# Patient Record
Sex: Male | Born: 1965 | Race: Black or African American | Hispanic: No | Smoking: Former smoker
Health system: Southern US, Community
[De-identification: ages and names within clinical notes are randomized; demographics above are authoritative.]

## PROBLEM LIST (undated history)

## (undated) DIAGNOSIS — R0602 Shortness of breath: Secondary | ICD-10-CM

## (undated) DIAGNOSIS — D571 Sickle-cell disease without crisis: Secondary | ICD-10-CM

## (undated) HISTORY — DX: Shortness of breath: R06.02

## (undated) HISTORY — PX: LAPAROSCOPIC GASTROTOMY W/ REPAIR OF ULCER: SUR772

---

## 1997-10-30 ENCOUNTER — Emergency Department (HOSPITAL_COMMUNITY): Admission: EM | Admit: 1997-10-30 | Discharge: 1997-10-30 | Payer: Self-pay | Admitting: Emergency Medicine

## 1997-11-04 ENCOUNTER — Emergency Department (HOSPITAL_COMMUNITY): Admission: EM | Admit: 1997-11-04 | Discharge: 1997-11-04 | Payer: Self-pay | Admitting: Emergency Medicine

## 2000-03-05 ENCOUNTER — Emergency Department (HOSPITAL_COMMUNITY): Admission: EM | Admit: 2000-03-05 | Discharge: 2000-03-05 | Payer: Self-pay

## 2000-03-05 ENCOUNTER — Encounter: Payer: Self-pay | Admitting: Emergency Medicine

## 2000-12-09 ENCOUNTER — Emergency Department (HOSPITAL_COMMUNITY): Admission: EM | Admit: 2000-12-09 | Discharge: 2000-12-09 | Payer: Self-pay | Admitting: Emergency Medicine

## 2001-01-04 ENCOUNTER — Emergency Department (HOSPITAL_COMMUNITY): Admission: EM | Admit: 2001-01-04 | Discharge: 2001-01-04 | Payer: Self-pay | Admitting: *Deleted

## 2002-01-23 ENCOUNTER — Emergency Department (HOSPITAL_COMMUNITY): Admission: EM | Admit: 2002-01-23 | Discharge: 2002-01-23 | Payer: Self-pay | Admitting: Emergency Medicine

## 2002-01-23 ENCOUNTER — Encounter: Payer: Self-pay | Admitting: Emergency Medicine

## 2003-03-13 ENCOUNTER — Emergency Department (HOSPITAL_COMMUNITY): Admission: AD | Admit: 2003-03-13 | Discharge: 2003-03-13 | Payer: Self-pay | Admitting: Family Medicine

## 2004-04-30 ENCOUNTER — Inpatient Hospital Stay (HOSPITAL_COMMUNITY): Admission: EM | Admit: 2004-04-30 | Discharge: 2004-05-01 | Payer: Self-pay | Admitting: Emergency Medicine

## 2005-07-07 ENCOUNTER — Inpatient Hospital Stay (HOSPITAL_COMMUNITY): Admission: EM | Admit: 2005-07-07 | Discharge: 2005-07-10 | Payer: Self-pay | Admitting: Emergency Medicine

## 2006-01-13 ENCOUNTER — Emergency Department (HOSPITAL_COMMUNITY): Admission: EM | Admit: 2006-01-13 | Discharge: 2006-01-13 | Payer: Self-pay | Admitting: *Deleted

## 2007-02-07 ENCOUNTER — Inpatient Hospital Stay (HOSPITAL_COMMUNITY): Admission: AC | Admit: 2007-02-07 | Discharge: 2007-02-10 | Payer: Self-pay
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
## 2011-01-23 LAB — I-STAT 8, (EC8 V) (CONVERTED LAB)
Acid-base deficit: 1
Bicarbonate: 24
Glucose, Bld: 131 — ABNORMAL HIGH
Hemoglobin: 13.9
Potassium: 3.9
TCO2: 25
pCO2, Ven: 41.5 — ABNORMAL LOW

## 2011-01-23 LAB — RAPID URINE DRUG SCREEN, HOSP PERFORMED
Barbiturates: NOT DETECTED
Benzodiazepines: NOT DETECTED
Cocaine: POSITIVE — AB
Opiates: NOT DETECTED
Tetrahydrocannabinol: NOT DETECTED

## 2011-01-23 LAB — PROTIME-INR: Prothrombin Time: 13

## 2011-01-23 LAB — TYPE AND SCREEN
ABO/RH(D): O NEG
Antibody Screen: NEGATIVE

## 2011-01-23 LAB — CBC
HCT: 37.7 — ABNORMAL LOW
Hemoglobin: 12.3 — ABNORMAL LOW
MCHC: 32.6
MCV: 91.1
RBC: 4.26
RDW: 15 — ABNORMAL HIGH
RDW: 15 — ABNORMAL HIGH

## 2011-01-23 LAB — POCT I-STAT CREATININE: Creatinine, Ser: 1.3

## 2011-09-02 ENCOUNTER — Encounter (HOSPITAL_COMMUNITY): Payer: Self-pay | Admitting: Emergency Medicine

## 2011-09-02 ENCOUNTER — Emergency Department (HOSPITAL_COMMUNITY)
Admission: EM | Admit: 2011-09-02 | Discharge: 2011-09-02 | Disposition: A | Discharge status: Home / Self Care | Payer: Self-pay | Attending: Emergency Medicine | Admitting: Emergency Medicine

## 2011-09-02 DIAGNOSIS — D571 Sickle-cell disease without crisis: Secondary | ICD-10-CM | POA: Insufficient documentation

## 2011-09-02 DIAGNOSIS — Z202 Contact with and (suspected) exposure to infections with a predominantly sexual mode of transmission: Secondary | ICD-10-CM | POA: Insufficient documentation

## 2011-09-02 DIAGNOSIS — R369 Urethral discharge, unspecified: Secondary | ICD-10-CM | POA: Insufficient documentation

## 2011-09-02 DIAGNOSIS — B356 Tinea cruris: Secondary | ICD-10-CM | POA: Insufficient documentation

## 2011-09-02 HISTORY — DX: Sickle-cell disease without crisis: D57.1

## 2011-09-02 LAB — URINALYSIS, ROUTINE W REFLEX MICROSCOPIC
Bilirubin Urine: NEGATIVE
Glucose, UA: NEGATIVE mg/dL
Ketones, ur: NEGATIVE mg/dL
Specific Gravity, Urine: 1.016 (ref 1.005–1.030)
Urobilinogen, UA: 1 mg/dL (ref 0.0–1.0)
pH: 6 (ref 5.0–8.0)

## 2011-09-02 LAB — URINE MICROSCOPIC-ADD ON

## 2011-09-02 MED ORDER — METRONIDAZOLE 500 MG PO TABS: 500.0000 mg | ORAL_TABLET | Freq: Two times a day (BID) | ORAL | Status: AC

## 2011-09-02 MED ORDER — CEFTRIAXONE SODIUM 250 MG IJ SOLR
250.0000 mg | Freq: Once | INTRAMUSCULAR | Status: AC
  Administered 2011-09-02: 250 mg via INTRAMUSCULAR
  Filled 2011-09-02: qty 250

## 2011-09-02 MED ORDER — AZITHROMYCIN 250 MG PO TABS
1000.0000 mg | ORAL_TABLET | Freq: Once | ORAL | Status: AC
  Administered 2011-09-02: 1000 mg via ORAL
  Filled 2011-09-02: qty 4

## 2011-09-02 MED ORDER — CLOTRIMAZOLE 1 % EX CREA: TOPICAL_CREAM | CUTANEOUS | Status: AC

## 2011-09-02 NOTE — ED Notes (Signed)
Itching rash in groin area, penile drainage

## 2011-09-02 NOTE — Discharge Instructions (Signed)
We will treat you for possible trichomonas infection with an antibiotic. You have also been pre-emptively treated for gonorrhea and chlamydia. Please do not drink alcohol while taking the antibiotic. Do not have intercourse until you have had the antibiotic for several days. If you have persistent symptoms, please follow up with the clinic at [facility].  Use Lotrimin for the skin rash.  RESOURCE GUIDE  Dental Problems  Patients with Medicaid: [facility] [phone]                                           [phone] [address] [employer] [address] Phone:  [phone]                                                  Phone:  [phone]  If unable to pay or uninsured, contact:  [facility] or [facility]. to become qualified for the adult dental clinic.  Chronic Pain Problems Contact [facility]  [phone] Patients need to be referred by their primary care doctor.  Insufficient Money for Medicine Contact United Way:  call "211" or [facility] 401-794-9402.  No Primary Care Doctor Call [facility]  346-607-3052 Other agencies that provide inexpensive medical care    [facility]  [phone]    [facility]  [phone]    [facility]  [phone]    [facility]  [phone]    [facility]anned [facility]  [phone]    [facility]  [phone]  Psychological Services [facility] Behavioral Health  [phone] [facility] Services  [phone] [facility] Mental Health   [phone] (emergency services [phone])  Substance Abuse Resources [facility] Drug Services  223-006-1951 [facility] [phone] [facility] (939)346-5060 Floydene Flock (234)[phone] Residential & Outpatient Substance Abuse Program  (938)884-5613  Abuse/Neglect [facility] Child Abuse Hotline 401-853-1612 [facility] Child Abuse Hotline (601)783-4003 (After  Hours)  Emergency Shelter [facility] 309-505-6784  Maternity Homes [facility] [place] [facility] [place] [phone] [facility] ([phone]  MRSA Hotline #:   (240) 235-7211    [facility] Resources  [facility]     United [employer]                          [facility] Dept. [address] [place]                       [address]      [address]  [place]                                                Cristobal Goldmann  Phone:  [phone]                                   Phone:  [phone]                 Phone:  [phone]  [facility] Mental Health Phone:  [phone]  [facility] Child Abuse Hotline 6475447734 [phone] (After Hours)

## 2011-09-02 NOTE — ED Provider Notes (Signed)
History     CSN: [id number]  Arrival date & time 09/02/11  1348   First MD Initiated Contact with Patient 09/02/11 1626      Chief Complaint  Patient presents with  . Penile Discharge    rash and penile discharge    (Consider location/radiation/quality/duration/timing/severity/associated sxs/prior treatment) HPI History from patient. 46 year old male who presents with complaint of STD check. His male partner was recently diagnosed with trichomonas. He denies any penile discharge, pain, scrotal pain, testicular pain or swelling. Has had a rash to the skin folds for the past one to 2 months which is unchanged. He has been treating this with alcohol but it has not been getting better. He has not noted any other lesions. He denies abdominal pain, nausea, vomiting. Denies urinary symptoms.  Past Medical History  Diagnosis Date  . Sickle cell anemia     Past Surgical History  Procedure Date  . Laparoscopic gastrotomy w/ repair of ulcer     Family History  Problem Relation Age of Onset  . Diabetes Mother   . Hypertension Mother     History  Substance Use Topics  . Smoking status: Current Everyday Smoker    Types: Cigarettes  . Smokeless tobacco: Not on file  . Alcohol Use: No      Review of Systems as per history of present illness  Allergies  Review of patient's allergies indicates no known allergies.  Home Medications  No current outpatient prescriptions on file.  BP 98/46  Pulse 71  Temp(Src) 98 F (36.7 C) (Oral)  Resp 18  SpO2 96%  Physical Exam  Nursing note and vitals reviewed. Constitutional: He appears well-developed and well-nourished. No distress.  HENT:  Head: Normocephalic and atraumatic.  Neck: Normal range of motion.  Cardiovascular: Normal rate.   Pulmonary/Chest: Effort normal.  Abdominal: Soft. There is no tenderness. There is no rebound and no guarding.  Genitourinary: Testes normal and penis normal. Circumcised. No discharge found.         Male chaperone present during exam Dry appearing rash, likely tinea, to skin folds of groin   Musculoskeletal: Normal range of motion.  Neurological: He is alert.  Skin: Skin is warm and dry. He is not diaphoretic.  Psychiatric: He has a normal mood and affect.    ED Course  Procedures (including critical care time)  Labs Reviewed  URINALYSIS, ROUTINE W REFLEX MICROSCOPIC - Abnormal; Notable for the following:    Leukocytes, UA SMALL (*)    All other components within normal limits  URINE MICROSCOPIC-ADD ON  GC/CHLAMYDIA PROBE AMP, URINE   No results found.   1. Possible exposure to STD   2. Tinea cruris       MDM  Patient with partner who is recently treated for Trichomonas. We will treat him for the same. He is not noted to have any discharge on exam. Urine GC/Chlamydia sent. Microscopic on urine shows white cells in clumps, so will preemptively treat for GC/Chlamydia.  Advised followup with the health department if he has persistent symptoms. Prescription given for lotrimin for tinea.        Grant Fontana, [place] 09/02/11 1806

## 2011-09-02 NOTE — ED Notes (Signed)
Pt called but was not in lobby

## 2011-09-03 NOTE — ED Provider Notes (Signed)
Medical screening examination/treatment/procedure(s) were performed by non-physician practitioner and as supervising physician I was immediately available for consultation/collaboration.   Rolan Bucco, MD 09/03/11 [phone]

## 2012-07-30 LAB — CBC AND DIFFERENTIAL: HCT: 38 % — AB (ref 41–53)

## 2012-07-30 LAB — LIPID PANEL
HDL: 45 mg/dL (ref 35–70)
LDl/HDL Ratio: 2.6
Triglycerides: 62 mg/dL (ref 40–160)

## 2012-07-30 LAB — HEPATIC FUNCTION PANEL
AST: 20 U/L (ref 14–40)
Bilirubin, Total: 1.1 mg/dL

## 2012-08-24 ENCOUNTER — Encounter: Payer: Self-pay | Admitting: General Practice

## 2012-09-04 ENCOUNTER — Telehealth (HOSPITAL_COMMUNITY): Payer: Self-pay

## 2012-09-04 NOTE — Telephone Encounter (Signed)
This CM received a phone call from Mr. Eriksson asking if I had spoke with Maxine Glenn at [facility] agency, this CM advise no, but a message was left for [facility] for a callback. Mr.Ege asked if he can go get his medications. This CM advised Mr.Fair that when we spoke on 08/20/2012 he stated he was going back to his pharmacy. Also this CM advised the patient to try [facility] on [address] to see if they will accept his [employer] coverage(purchase of medical care services). Mr.Traum has had his prescrtptions dated 07/29/12 which have not been filled. Mr.Beery stated his regular pharmacy [facility] on [address] will no longer accept [employer]OM[employer]. This CM will await a call from Dollene Primrose with [facility].    Karoline Caldwell, RN, BSN, [place] [phone]

## 2012-09-11 ENCOUNTER — Telehealth: Payer: Self-pay

## 2012-09-11 NOTE — Telephone Encounter (Signed)
Case Management Note: This CM called to advise Mr.Beaulac a prior approval for his [employer]OM[employer] Conservation officer, nature of Medical Care Services) coverage was approved with [facility]. PA# [id number] dates: 07/29/2012-10/12/2012  &  PA# [id number] dates:10/13/2012-07/13/2013. However Mr.Lyels was not home and this CM left a message with family member.    Karoline Caldwell, RN, BSN, [place]  [phone]

## 2012-09-30 ENCOUNTER — Encounter: Payer: Self-pay | Admitting: Internal Medicine

## 2012-09-30 ENCOUNTER — Ambulatory Visit (INDEPENDENT_AMBULATORY_CARE_PROVIDER_SITE_OTHER): Payer: Self-pay | Admitting: Internal Medicine

## 2012-09-30 VITALS — BP 118/73 | HR 61 | Temp 97.6°F | Resp 18 | Ht 72.0 in | Wt 204.0 lb

## 2012-09-30 DIAGNOSIS — N4832 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: Secondary | ICD-10-CM

## 2012-09-30 DIAGNOSIS — N4830 Priapism, unspecified: Secondary | ICD-10-CM

## 2012-09-30 DIAGNOSIS — D5720 Sickle-cell/Hb-C disease without crisis: Secondary | ICD-10-CM

## 2012-09-30 HISTORY — DX: 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: N48.32

## 2012-09-30 MED ORDER — GABAPENTIN 300 MG PO CAPS: 300.0000 mg | ORAL_CAPSULE | Freq: Three times a day (TID) | ORAL | Status: DC

## 2012-09-30 MED ORDER — FOLIC ACID 1 MG PO TABS: 1.0000 mg | ORAL_TABLET | Freq: Every day | ORAL | Status: DC

## 2012-09-30 MED ORDER — ACETAMINOPHEN-CODEINE #3 300-30 MG PO TABS: 1.0000 | ORAL_TABLET | Freq: Four times a day (QID) | ORAL | Status: DC | PRN

## 2012-09-30 MED ORDER — TERBUTALINE SULFATE 2.5 MG PO TABS: 2.5000 mg | ORAL_TABLET | Freq: Four times a day (QID) | ORAL | Status: DC | PRN

## 2012-09-30 NOTE — Progress Notes (Signed)
  Subjective:    Patient ID: Peter Kline, male    DOB: December 01, 1965, 47 y.o.   MRN: [id number]  HPI: Pt with [place] disease here today for follow up. Pt has been without any medications for pain control as he has been unable to obtain them. He states that he's had approximately 3 episodes of pain lasting several hours. He's had one episode of pain lasting 2 days. He states that the pain is usually localized to his back and legs and is aching in nature. He cannot identify any palliative or provocative features and there are no associated symptoms. He states that he usually rests and drinks lots of water during these periods. He is also described several episodes of priapism lasting 6-8 hours at a time. The patient did not seek medical assistance for this as he did not understand the implications in the context of his sickle cell disease. Otherwise the patient has no complaints. He denies any fever, chills, nausea, vomiting or diarrhea.    Review of Systems  All other systems reviewed and are negative.       Objective:   Physical Exam  Constitutional: He is oriented to person, place, and time. He appears well-developed and well-nourished.  HENT:  Head: Normocephalic and atraumatic.  Poor dentition  Eyes: Conjunctivae and EOM are normal. Pupils are equal, round, and reactive to light.  Patient had a recent eye examination and no evidence of retinopathy of sickle cell disease.  Neck: Normal range of motion. Neck supple. No JVD present. No thyromegaly present.  Cardiovascular: Normal rate.   Pulmonary/Chest: Effort normal and breath sounds normal.  Abdominal: Soft. Bowel sounds are normal.  Genitourinary: Penis normal.  Musculoskeletal: Normal range of motion.  Lymphadenopathy:    He has no cervical adenopathy.  Neurological: He is alert and oriented to person, place, and time. No cranial nerve deficit.  Skin: Skin is warm and dry.  Psychiatric: He has a normal mood and affect. His behavior  is normal. Judgment and thought content normal.          Assessment & Plan:  1. 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 Pt has had no medications since April. He has had several crises lasting hours and also priapism. Will obtain labs tomorrow Electrophoresis, CBC with diff, CMET, Ferritin. Continue Folic acid and Tylenol #3 as needed. Patient had eye examination showed no evidence of retinopathy of sickle cell disease. Needs to get a 2-D echocardiogram for baseline  2. Priapism: Will check Electrophoresis. Educated patient on the importance of seeking medical care with the onset of priapism. Will start on brethene (Terbutaline) 2.5 mg PO q 6 hours PRN Priapism

## 2012-09-30 NOTE — Progress Notes (Signed)
Patient: Peter Kline DOB :[birth year] MRN :[id number]  Date: 09/30/2012  Documentation Initiated by : Jefm Miles  Subjective/Objective Assessment: Peter Kline is a 47 year old male with known SCD. He is in for routine office visit. Peter Kline stated he was unable to obtain his medications from [facility].  Barriers: Unable to obtain medications  Prior Approval (PA) #: [id number] PA start date: 07/29/2012    PA end date: 10/12/2012  Prior Approval (PA) #: [id number] PA start date: 10/13/2012    PA end date: 07/13/2013   Action/Plan: This is CM spoke with Peter Kline to advise him of using [facility] and gave him a print out with [facility] pharmacy's address. This CM also advised Peter Kline that his medications Acetaminophen/Codeine, Folic acid and gabapentin are showing on the POM[employer](Purchase of medical care services) [place] formulary. This CM advised Peter Kline to call if he has any additional concerns.   Comments: NONE  Time spent: 30 mins Shon Baton, BSN, [place]   [phone]

## 2012-10-01 ENCOUNTER — Other Ambulatory Visit: Payer: Self-pay | Admitting: *Deleted

## 2012-10-01 ENCOUNTER — Other Ambulatory Visit: Payer: Self-pay | Admitting: Internal Medicine

## 2012-10-01 DIAGNOSIS — N4832 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: Secondary | ICD-10-CM

## 2012-10-01 DIAGNOSIS — D5720 Sickle-cell/Hb-C disease without crisis: Secondary | ICD-10-CM

## 2012-10-01 LAB — FERRITIN: Ferritin: 148 ng/mL (ref 22–322)

## 2012-10-03 LAB — COMPREHENSIVE METABOLIC PANEL
ALT: 26 U/L (ref 0–53)
Calcium: 9.6 mg/dL (ref 8.4–10.5)
Creat: 1.33 mg/dL (ref 0.50–1.35)

## 2012-10-06 LAB — HEMOGLOBINOPATHY EVALUATION
Hemoglobin Other: 44 % — ABNORMAL HIGH
Hgb A2 Quant: 3.4 % — ABNORMAL HIGH (ref 2.2–3.2)
Hgb A: 0 % — ABNORMAL LOW (ref 96.8–97.8)
Hgb F Quant: 1.4 % (ref 0.0–2.0)
Hgb S Quant: 51.2 % — ABNORMAL HIGH

## 2012-10-07 ENCOUNTER — Encounter (HOSPITAL_COMMUNITY): Payer: Self-pay

## 2012-11-02 ENCOUNTER — Ambulatory Visit (INDEPENDENT_AMBULATORY_CARE_PROVIDER_SITE_OTHER): Payer: Medicaid Other | Admitting: Internal Medicine

## 2012-11-02 ENCOUNTER — Encounter: Payer: Self-pay | Admitting: Internal Medicine

## 2012-11-02 ENCOUNTER — Ambulatory Visit (HOSPITAL_COMMUNITY)
Admission: AD | Admit: 2012-11-02 | Discharge: 2012-11-02 | Disposition: A | Discharge status: Home / Self Care | Payer: Medicaid Other | Source: Ambulatory Visit | Attending: Internal Medicine | Admitting: Internal Medicine

## 2012-11-02 VITALS — BP 121/69 | HR 82 | Temp 98.7°F | Wt 208.0 lb

## 2012-11-02 DIAGNOSIS — D571 Sickle-cell disease without crisis: Secondary | ICD-10-CM | POA: Insufficient documentation

## 2012-11-02 DIAGNOSIS — R52 Pain, unspecified: Secondary | ICD-10-CM | POA: Insufficient documentation

## 2012-11-02 DIAGNOSIS — D5720 Sickle-cell/Hb-C disease without crisis: Secondary | ICD-10-CM

## 2012-11-02 DIAGNOSIS — N4830 Priapism, unspecified: Secondary | ICD-10-CM | POA: Insufficient documentation

## 2012-11-02 LAB — CBC WITH DIFFERENTIAL/PLATELET
Basophils Absolute: 0.1 10*3/uL (ref 0.0–0.1)
HCT: 36.5 % — ABNORMAL LOW (ref 39.0–52.0)
Lymphocytes Relative: 36 % (ref 12–46)
Lymphs Abs: 4.2 10*3/uL — ABNORMAL HIGH (ref 0.7–4.0)
Monocytes Absolute: 1.1 10*3/uL — ABNORMAL HIGH (ref 0.1–1.0)
Neutro Abs: 5.9 10*3/uL (ref 1.7–7.7)
RBC: 4.75 MIL/uL (ref 4.22–5.81)
RDW: 16.2 % — ABNORMAL HIGH (ref 11.5–15.5)
WBC: 11.8 10*3/uL — ABNORMAL HIGH (ref 4.0–10.5)

## 2012-11-02 MED ORDER — GABAPENTIN 300 MG PO CAPS: 300.0000 mg | ORAL_CAPSULE | Freq: Three times a day (TID) | ORAL | Status: DC

## 2012-11-02 MED ORDER — ACETAMINOPHEN-CODEINE #3 300-30 MG PO TABS: 1.0000 | ORAL_TABLET | Freq: Four times a day (QID) | ORAL | Status: DC | PRN

## 2012-11-02 NOTE — Procedures (Signed)
[facility]  Procedure Note  Peter Kline ZOX:[id number] DOB: 1966-03-30 DOA: 11/02/2012   PCP: MATTHEWS,MICHELLE A., MD   Associated Diagnosis: Sickle Cell Disease without crisis    Procedure Note: Lab draw from peripheral site    Condition During Procedure: Tolerated well; no complications noted   Condition at Discharge:  No apparent distress, ambulatory, no complications noted   Katrinka Blazing, Joslyn Hy, RN  [facility]

## 2012-11-02 NOTE — Progress Notes (Signed)
  Subjective:    Patient ID: Peter Kline, male    DOB: [birth year], 47 y.o.   MRN: [id number]  HPI Pt with [place] disease here today for follow up on pain and priapism. He states that his pain has been less in frequency and intensity since last visit. The patient also has continued to have priapism was however not as frequently and lasting less than 2 hours.  He reinforces that he usually rests and drinks lots of water on a daily basis. Otherwise the patient has no complaints. He denies any fever, chills, nausea, vomiting or diarrhea.        Review of Systems  Constitutional: Negative.   HENT: Negative.   Eyes: Negative.   Respiratory: Negative.   Cardiovascular: Negative.   Gastrointestinal: Negative.   Endocrine: Negative.   Genitourinary: Negative.        Priapism  Musculoskeletal: Positive for myalgias and arthralgias. Negative for back pain.  Skin: Negative.   Allergic/Immunologic: Negative.   Neurological: Negative.   Hematological: Negative.   Psychiatric/Behavioral: Negative.        Objective:   Physical Exam  Constitutional: He is oriented to person, place, and time. He appears well-developed and well-nourished. No distress.  HENT:  Head: Atraumatic.  Eyes: Conjunctivae and EOM are normal. Pupils are equal, round, and reactive to light. No scleral icterus.  Neck: Normal range of motion. Neck supple.  Cardiovascular: Normal rate and regular rhythm.  Exam reveals no gallop and no friction rub.   No murmur heard. Pulmonary/Chest: Effort normal and breath sounds normal. He has no wheezes. He has no rales. He exhibits no tenderness.  Abdominal: Soft. Bowel sounds are normal. He exhibits no mass.  Musculoskeletal: Normal range of motion.  Neurological: He is alert and oriented to person, place, and time.  Skin: Skin is warm and dry.  Psychiatric: He has a normal mood and affect. His behavior is normal. Judgment and thought content normal.          Assessment & Plan:   1. 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 Recommend continue Folic acid and Tylenol #3 as needed. Patient had eye examination showed no evidence of retinopathy of sickle cell disease. Needs to get a 2-D echocardiogram for baseline and to evaluate his complaint of right sided heart abnormality.  2. Priapism: Pt did not start on brethene (Terbutaline) 2.5 mg PO q 6 hours PRN Priapism as prescribed on last visit. Will pick prescription up from Pharmacy and start medication.   RTC: When necessary or after echocardiogram completed  Labs: CBC with differential, urinalysis with microalbumin

## 2012-11-03 LAB — MICROALBUMIN, URINE: Microalb, Ur: 0.5 mg/dL (ref 0.00–1.89)

## 2012-12-22 ENCOUNTER — Encounter: Payer: Self-pay | Admitting: Internal Medicine

## 2013-03-03 ENCOUNTER — Other Ambulatory Visit: Payer: Self-pay | Admitting: Internal Medicine

## 2013-03-03 ENCOUNTER — Telehealth: Payer: Self-pay | Admitting: Internal Medicine

## 2013-03-03 DIAGNOSIS — D5720 Sickle-cell/Hb-C disease without crisis: Secondary | ICD-10-CM

## 2013-03-03 MED ORDER — ACETAMINOPHEN-CODEINE #3 300-30 MG PO TABS: 1.0000 | ORAL_TABLET | Freq: Four times a day (QID) | ORAL | Status: DC | PRN

## 2013-03-03 MED ORDER — GABAPENTIN 300 MG PO CAPS: 300.0000 mg | ORAL_CAPSULE | Freq: Three times a day (TID) | ORAL | Status: DC

## 2013-03-03 NOTE — Progress Notes (Signed)
Prescription for Gabapentin 300 mg TID #90 with 11 refills and Tylenol with Codeine (#3) #30 pills. Pt has an appointment scheduled. Has not been seen since 11/02/2012

## 2013-03-09 ENCOUNTER — Ambulatory Visit (INDEPENDENT_AMBULATORY_CARE_PROVIDER_SITE_OTHER): Payer: Medicaid Other | Admitting: Internal Medicine

## 2013-03-09 ENCOUNTER — Encounter: Payer: Self-pay | Admitting: Internal Medicine

## 2013-03-09 VITALS — BP 131/74 | HR 88 | Temp 98.3°F | Resp 16 | Ht 71.0 in | Wt 200.0 lb

## 2013-03-09 DIAGNOSIS — N4832 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: Secondary | ICD-10-CM

## 2013-03-09 DIAGNOSIS — R3989 Other symptoms and signs involving the genitourinary system: Secondary | ICD-10-CM

## 2013-03-09 DIAGNOSIS — R399 Unspecified symptoms and signs involving the genitourinary system: Secondary | ICD-10-CM | POA: Insufficient documentation

## 2013-03-09 DIAGNOSIS — Z23 Encounter for immunization: Secondary | ICD-10-CM

## 2013-03-09 DIAGNOSIS — N4830 Priapism, unspecified: Secondary | ICD-10-CM

## 2013-03-09 MED ORDER — TERBUTALINE SULFATE 2.5 MG PO TABS: 2.5000 mg | ORAL_TABLET | Freq: Four times a day (QID) | ORAL | Status: DC | PRN

## 2013-03-09 NOTE — Progress Notes (Signed)
  Subjective:    Patient ID: Peter Kline, male    DOB: 12-15-1965, 47 y.o.   MRN: [id number]  HPI: Pt states that he has been having minor crises and he has been treating with fluids. Pt has been out of Tylenol #3 for some time but has not returned for a visit or called the office. He has also been having continued priapism last episode about 2 weeks ago lasting 2-3 hours.  Pt had been taking terbutaline as prescribed and has a decreased frequency of Priapism however he has been out of Terbutaline and has since had increased frequency.  Pt also complains of weakened stream of urine for about 2 months. Pt denies any dysuria. He states that he intermittently has thickened semen on ejaculation.     Review of Systems  Constitutional: Negative.   HENT: Negative.   Eyes: Negative.   Respiratory: Negative.   Cardiovascular: Negative.   Gastrointestinal: Negative.   Endocrine: Negative.   Genitourinary: Negative.   Allergic/Immunologic: Negative.   Neurological: Negative.   Hematological: Negative.   Psychiatric/Behavioral: Negative.        Objective:   Physical Exam  Constitutional: He is oriented to person, place, and time. He appears well-developed and well-nourished.  HENT:  Head: Atraumatic.  Eyes: Conjunctivae and EOM are normal. Pupils are equal, round, and reactive to light. No scleral icterus.  Neck: Normal range of motion. Neck supple.  Cardiovascular: Normal rate and regular rhythm.  Exam reveals no gallop and no friction rub.   No murmur heard. Pulmonary/Chest: Effort normal and breath sounds normal. He has no wheezes. He has no rales. He exhibits no tenderness.  Abdominal: Soft. Bowel sounds are normal. He exhibits no mass.  Musculoskeletal: Normal range of motion.  Neurological: He is alert and oriented to person, place, and time.  Skin: Skin is warm and dry.  Areas of hypopigmentation that is unchanged for several years.  Psychiatric: He has a normal mood and affect.  His behavior is normal. Judgment and thought content normal.          Assessment & Plan:  1. Hb [place] without crisis: Pt has been having recurrent episodes of Priapism which improved with terbutaline. Will resume Terbutaline. Pt encouraged to maintain hydration and seek further management if he has persistent Priapism despite appropriate treatment.  Pt doing well with regard to pain. Prescription refilled for Tylenol #3 which patient uses very infrequently.  2. LUTS: Pt reports voiding symptoms including slow and weak stream. Will check Cr, PSA and  U/A for signs of infection or hematuria. Pt reports obtaining a pill to help this while he was incarcerated. Will obtain ultrasound if Cr is increased. If no increase in Cr will refer to Urology.   3. Immunization: Needs Influenza and tetanus booster.  4. Rash: Pt has an area of hypopigmentation on upper back whic has been present fro years adn has been unchanged.   Labs: PSA, BMET, U/A  RTC: 3 months.

## 2013-03-16 ENCOUNTER — Telehealth: Payer: Self-pay | Admitting: Internal Medicine

## 2013-03-17 ENCOUNTER — Other Ambulatory Visit: Payer: Self-pay | Admitting: Internal Medicine

## 2013-03-17 DIAGNOSIS — D5720 Sickle-cell/Hb-C disease without crisis: Secondary | ICD-10-CM

## 2013-03-17 MED ORDER — ACETAMINOPHEN-CODEINE #3 300-30 MG PO TABS: 1.0000 | ORAL_TABLET | Freq: Four times a day (QID) | ORAL | Status: DC | PRN

## 2013-03-17 NOTE — Progress Notes (Signed)
Prescription issued for Tylenol #3 300-30 mg #30 pills.

## 2013-06-09 ENCOUNTER — Ambulatory Visit: Payer: Medicaid Other | Admitting: Internal Medicine

## 2013-06-10 ENCOUNTER — Ambulatory Visit: Payer: Medicaid Other | Admitting: Internal Medicine

## 2013-06-11 ENCOUNTER — Telehealth: Payer: Self-pay | Admitting: Internal Medicine

## 2013-06-11 NOTE — Telephone Encounter (Signed)
Called patient to reschedule appointment from 06/10/13. Number in system is mother's number. Number given by mother is invalid.

## 2013-06-23 ENCOUNTER — Telehealth: Payer: Self-pay | Admitting: Internal Medicine

## 2013-06-23 NOTE — Telephone Encounter (Signed)
Attempted to call patient for follow up appointment. Phone number is disconnected.

## 2013-06-24 ENCOUNTER — Telehealth: Payer: Self-pay | Admitting: Internal Medicine

## 2013-06-25 NOTE — Telephone Encounter (Signed)
Pt received prescription for Gabapentin on 03/03/2013 with 11 refills; this request not refilled at this time; Pt needs an office visit appointment for follow-up

## 2013-07-06 ENCOUNTER — Ambulatory Visit: Payer: Medicaid Other | Admitting: Family Medicine

## 2013-07-15 ENCOUNTER — Telehealth: Payer: Self-pay | Admitting: Internal Medicine

## 2013-07-15 ENCOUNTER — Ambulatory Visit: Payer: Medicaid Other | Admitting: Internal Medicine

## 2013-07-15 NOTE — Telephone Encounter (Signed)
Patient no show 07/15/13. Unable to leave voicemail for patient to reschedule.

## 2013-08-13 ENCOUNTER — Ambulatory Visit: Payer: Medicaid Other | Admitting: Family Medicine

## 2013-08-26 ENCOUNTER — Encounter (HOSPITAL_COMMUNITY): Payer: Self-pay | Admitting: Hematology

## 2013-08-26 ENCOUNTER — Non-Acute Institutional Stay (HOSPITAL_COMMUNITY)
Admission: AD | Admit: 2013-08-26 | Discharge: 2013-08-26 | Disposition: A | Discharge status: Home / Self Care | Payer: PRIVATE HEALTH INSURANCE | Source: Ambulatory Visit | Attending: Internal Medicine | Admitting: Internal Medicine

## 2013-08-26 ENCOUNTER — Encounter: Payer: Self-pay | Admitting: Internal Medicine

## 2013-08-26 ENCOUNTER — Ambulatory Visit (INDEPENDENT_AMBULATORY_CARE_PROVIDER_SITE_OTHER): Payer: Medicaid Other | Admitting: Internal Medicine

## 2013-08-26 VITALS — BP 127/76 | HR 68 | Temp 98.2°F | Resp 20 | Ht 72.0 in | Wt 179.0 lb

## 2013-08-26 DIAGNOSIS — D57219 Sickle-cell/Hb-C disease with crisis, unspecified: Secondary | ICD-10-CM

## 2013-08-26 DIAGNOSIS — N4830 Priapism, unspecified: Secondary | ICD-10-CM

## 2013-08-26 DIAGNOSIS — D571 Sickle-cell disease without crisis: Secondary | ICD-10-CM | POA: Insufficient documentation

## 2013-08-26 DIAGNOSIS — F17200 Nicotine dependence, unspecified, uncomplicated: Secondary | ICD-10-CM | POA: Insufficient documentation

## 2013-08-26 DIAGNOSIS — D5720 Sickle-cell/Hb-C disease without crisis: Secondary | ICD-10-CM | POA: Diagnosis present

## 2013-08-26 DIAGNOSIS — Z79899 Other long term (current) drug therapy: Secondary | ICD-10-CM | POA: Insufficient documentation

## 2013-08-26 DIAGNOSIS — D5700 Hb-SS disease with crisis, unspecified: Secondary | ICD-10-CM | POA: Diagnosis present

## 2013-08-26 DIAGNOSIS — N4832 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: Secondary | ICD-10-CM

## 2013-08-26 DIAGNOSIS — R634 Abnormal weight loss: Secondary | ICD-10-CM

## 2013-08-26 LAB — RETICULOCYTES
RBC.: 4.71 MIL/uL (ref 4.22–5.81)
RETIC COUNT ABSOLUTE: 240.2 10*3/uL — AB (ref 19.0–186.0)
RETIC CT PCT: 5.1 % — AB (ref 0.4–3.1)

## 2013-08-26 LAB — CBC WITH DIFFERENTIAL/PLATELET
BASOS ABS: 0.1 10*3/uL (ref 0.0–0.1)
Basophils Relative: 1 % (ref 0–1)
EOS PCT: 4 % (ref 0–5)
Eosinophils Absolute: 0.4 10*3/uL (ref 0.0–0.7)
HCT: 37.8 % — ABNORMAL LOW (ref 39.0–52.0)
Hemoglobin: 13.7 g/dL (ref 13.0–17.0)
Lymphocytes Relative: 27 % (ref 12–46)
Lymphs Abs: 2.8 10*3/uL (ref 0.7–4.0)
MCH: 29.1 pg (ref 26.0–34.0)
MCHC: 36.2 g/dL — ABNORMAL HIGH (ref 30.0–36.0)
MCV: 80.3 fL (ref 78.0–100.0)
MONO ABS: 1 10*3/uL (ref 0.1–1.0)
MONOS PCT: 10 % (ref 3–12)
NEUTROS PCT: 58 % (ref 43–77)
Neutro Abs: 5.9 10*3/uL (ref 1.7–7.7)
PLATELETS: 260 10*3/uL (ref 150–400)
RBC: 4.71 MIL/uL (ref 4.22–5.81)
RDW: 16 % — ABNORMAL HIGH (ref 11.5–15.5)
WBC: 10.2 10*3/uL (ref 4.0–10.5)

## 2013-08-26 LAB — COMPREHENSIVE METABOLIC PANEL
ALBUMIN: 3.7 g/dL (ref 3.5–5.2)
ALT: 32 U/L (ref 0–53)
AST: 56 U/L — AB (ref 0–37)
Alkaline Phosphatase: 73 U/L (ref 39–117)
BILIRUBIN TOTAL: 0.7 mg/dL (ref 0.3–1.2)
BUN: 16 mg/dL (ref 6–23)
CHLORIDE: 104 meq/L (ref 96–112)
CO2: 26 mEq/L (ref 19–32)
CREATININE: 1.06 mg/dL (ref 0.50–1.35)
Calcium: 9.2 mg/dL (ref 8.4–10.5)
GFR calc Af Amer: >90 mL/min (ref >90)
GFR calc non Af Amer: 81 mL/min — ABNORMAL LOW (ref >90)
Glucose, Bld: 96 mg/dL (ref 70–99)
POTASSIUM: 4.8 meq/L (ref 3.7–5.3)
Sodium: 140 mEq/L (ref 137–147)
TOTAL PROTEIN: 7.6 g/dL (ref 6.0–8.3)

## 2013-08-26 LAB — LACTATE DEHYDROGENASE: LDH: 479 U/L — ABNORMAL HIGH (ref 94–250)

## 2013-08-26 MED ORDER — MORPHINE SULFATE 4 MG/ML IJ SOLN
8.0000 mg | Freq: Once | INTRAMUSCULAR | Status: AC
  Administered 2013-08-26: 8 mg via INTRAVENOUS
  Filled 2013-08-26: qty 2

## 2013-08-26 MED ORDER — MORPHINE SULFATE 4 MG/ML IJ SOLN
4.0000 mg | Freq: Once | INTRAMUSCULAR | Status: AC
  Administered 2013-08-26: 4 mg via INTRAVENOUS
  Filled 2013-08-26: qty 1

## 2013-08-26 MED ORDER — TERBUTALINE SULFATE 2.5 MG PO TABS: 2.5000 mg | ORAL_TABLET | Freq: Four times a day (QID) | ORAL | Status: DC | PRN

## 2013-08-26 MED ORDER — DEXTROSE-NACL 5-0.45 % IV SOLN
INTRAVENOUS | Status: DC
  Administered 2013-08-26: 15:00:00 via INTRAVENOUS

## 2013-08-26 MED ORDER — OXYCODONE HCL 5 MG PO TABS
5.0000 mg | ORAL_TABLET | Freq: Once | ORAL | Status: AC
  Administered 2013-08-26: 5 mg via ORAL
  Filled 2013-08-26: qty 1

## 2013-08-26 MED ORDER — FOLIC ACID 1 MG PO TABS: 1.0000 mg | ORAL_TABLET | Freq: Every day | ORAL | Status: DC

## 2013-08-26 MED ORDER — OXYCODONE HCL 5 MG PO TABS: 5.0000 mg | ORAL_TABLET | ORAL | Status: DC | PRN

## 2013-08-26 MED ORDER — MORPHINE SULFATE 4 MG/ML IJ SOLN
6.0000 mg | Freq: Once | INTRAMUSCULAR | Status: AC
  Administered 2013-08-26: 6 mg via INTRAVENOUS
  Filled 2013-08-26: qty 2

## 2013-08-26 NOTE — Discharge Summary (Signed)
[facility] Discharge Summary   Patient ID: Peter Kline MRN: [id number] DOB/AGE: 05/26/1965 48 y.o.  Admit date: 08/26/2013 Discharge date: 08/26/2013  Primary Care Physician:  MATTHEWS,MICHELLE A., MD  Admission Diagnoses:  Principal Problem:   Sickle cell anemia with pain Active Problems:   Sickle cell disease, type [place]   Discharge Diagnoses:   Sickle cell anemia with pain Discharge Medications:    Medication List         folic acid 1 MG tablet  Commonly known as:  FOLVITE  Take 1 tablet (1 mg total) by mouth daily.     gabapentin 300 MG capsule  Commonly known as:  NEURONTIN  Take 1 capsule (300 mg total) by mouth 3 (three) times daily.     oxyCODONE 5 MG immediate release tablet  Commonly known as:  Oxy IR/ROXICODONE  Take 1 tablet (5 mg total) by mouth every 4 (four) hours as needed for severe pain.     terbutaline 2.5 MG tablet  Commonly known as:  BRETHINE  Take 1 tablet (2.5 mg total) by mouth every 6 (six) hours as needed (Priapism).         Consults:  None  Significant Diagnostic Studies:  No results found.   [facility] Course: Patient was admitted to the day hospital for extended observation. Patient was started on hypotonic IVFs at 125 mg per hour. Given IV Morphine times 3 doses and Oxycodone 5 mg, pain intensity decreased to 7/10 during extended observation. A Patient reports that he feels a lot better and can function at home. Patient given Rx for Oxycodone 5 mg immediate release every 4 hours for severe pain and was asked to follow up with Dr. Ashley RoyaltyMatthews as scheduled.  Physical Exam at Discharge:  BP 125/69  Pulse 62  Temp(Src) 98.1 F (36.7 C) (Oral)  Resp 18  SpO2 98% General appearance: alert, cooperative, icteric and mild distress Head: Normocephalic, without obvious abnormality, atraumatic Back: symmetric, no curvature. ROM normal. No CVA tenderness. Lungs: clear to auscultation bilaterally Heart:  regular rate and rhythm, S1, S2 normal, no murmur, click, rub or gallop Abdomen: soft, non-tender; bowel sounds normal; no masses,  no organomegaly Extremities: no edema, redness or tenderness in the calves or thighs    Disposition at Discharge: 01-Home or Self Care  Discharge Orders:   Condition at Discharge:   Stable  Time spent on Discharge:  Greater than 30 minutes.  Signed: Massie MaroonLachina M Nikolis Berent 08/26/2013, 6:37 PM

## 2013-08-26 NOTE — Progress Notes (Signed)
Patient ID: Peter Kline, male   DOB: May 02, 1965, 48 y.o.   MRN: [id number] Pt discharged to home; discharge instructions given, explained, and signed and all questions answered; no complications noted

## 2013-08-26 NOTE — H&P (Signed)
[facility] History and Physical   Date: 08/26/2013  Patient name: Peter Kline Medical record number: [id number] Date of birth: Nov 06, 1965 Age: 48 y.o. Gender: male PCP: MATTHEWS,MICHELLE A., MD  Attending physician: Altha Harm, MD  Chief Complaint: Sickle cell pain  History of Present Illness: 48 year old male with a history of sickle cell anemia, HbSC presents with lower extremity pain. Patient reports that pain intensity has been increasing over the past 3-4 months.He describes pain intensity as 10/10, throbbing, constant, and non-radiating.  Pain was previously controlled on Tylenol w/codeine and Gabapentin. Patient states that pain has not been controlled on current regimen. Patient admits that he has missed his past 4 appointments due to work constraints. Patient states that it has been difficult to leave work for appointments.  He maintains  that he has been taking folic acid consistently. Patient transitioned from primary care to the day hospital for extended observation.    Meds: Prescriptions prior to admission  Medication Sig Dispense Refill  . acetaminophen-codeine (TYLENOL #3) 300-30 MG per tablet Take 1 tablet by mouth every 6 (six) hours as needed.  30 tablet  0  . folic acid (FOLVITE) 1 MG tablet Take 1 tablet (1 mg total) by mouth daily.  30 tablet  11  . gabapentin (NEURONTIN) 300 MG capsule Take 1 capsule (300 mg total) by mouth 3 (three) times daily.  90 capsule  11  . terbutaline (BRETHINE) 2.5 MG tablet Take 1 tablet (2.5 mg total) by mouth every 6 (six) hours as needed (Priapism).  30 tablet  2    Allergies: Review of patient's allergies indicates no known allergies. Past Medical History  Diagnosis Date  . Sickle cell anemia   . Shortness of breath    Past Surgical History  Procedure Laterality Date  . Laparoscopic gastrotomy w/ repair of ulcer     Family History  Problem Relation Age of Onset  . Diabetes Mother   . Hypertension  Mother    History   Social History  . Marital Status: Single    Spouse Name: N/A    Number of Children: N/A  . Years of Education: N/A   Occupational History  . Not on file.   Social History Main Topics  . Smoking status: Current Every Day Smoker -- 0.50 packs/day    Types: Cigarettes  . Smokeless tobacco: Never Used  . Alcohol Use: 3.6 oz/week    6 Cans of beer per week  . Drug Use: No  . Sexual Activity: Yes   Other Topics Concern  . Not on file   Social History Narrative  . No narrative on file    Review of Systems: Eyes: negative Ears, nose, mouth, throat, and face: negative Respiratory: negative Cardiovascular: negative Gastrointestinal: negative Genitourinary:positive for hesitancy Integument/breast: negative Hematologic/lymphatic: negative Musculoskeletal:positive for myalgias Neurological: negative Behavioral/Psych: negative Endocrine: negative Allergic/Immunologic: negative  Physical Exam: Blood pressure 125/69, pulse 62, temperature 98.1 F (36.7 C), temperature source Oral, resp. rate 18, SpO2 98.00%. General appearance: alert, cooperative, icteric and mild distress Head: Normocephalic, without obvious abnormality, atraumatic Eyes: positive findings: sclera icteric Ears: normal TM's and external ear canals both ears Nose: Nares normal. Septum midline. Mucosa normal. No drainage or sinus tenderness. Throat: lips, mucosa, and tongue normal; teeth and gums normal Neck: no adenopathy, no carotid bruit, no JVD, supple, symmetrical, trachea midline and thyroid not enlarged, symmetric, no tenderness/mass/nodules Back: symmetric, no curvature. ROM normal. No CVA tenderness. Lungs: clear to auscultation bilaterally  Heart: regular rate and rhythm, S1, S2 normal, no murmur, click, rub or gallop Abdomen: soft, non-tender; bowel sounds normal; no masses,  no organomegaly Extremities: no edema, redness or tenderness in the calves or thighs Pulses: 2+ and  symmetric Skin: Skin color, texture, turgor normal. No rashes or lesions Lymph nodes: Cervical, supraclavicular, and axillary nodes normal. Neurologic: Alert and oriented X 3, normal strength and tone. Normal symmetric reflexes. Normal coordination and gait  Lab results: Results for orders placed during the hospital encounter of 08/26/13 (from the past 24 hour(s))  CBC WITH DIFFERENTIAL     Status: Abnormal   Collection Time    08/26/13  2:31 PM      Result Value Ref Range   WBC 10.2  4.0 - 10.5 K/uL   RBC 4.71  4.22 - 5.81 MIL/uL   Hemoglobin 13.7  13.0 - 17.0 g/dL   HCT 16.137.8 (*) 09.639.0 - 04.552.0 %   MCV 80.3  78.0 - 100.0 fL   MCH 29.1  26.0 - 34.0 pg   MCHC 36.2 (*) 30.0 - 36.0 g/dL   RDW 40.916.0 (*) 81.111.5 - 91.415.5 %   Platelets 260  150 - 400 K/uL   Neutrophils Relative % 58  43 - 77 %   Lymphocytes Relative 27  12 - 46 %   Monocytes Relative 10  3 - 12 %   Eosinophils Relative 4  0 - 5 %   Basophils Relative 1  0 - 1 %   Neutro Abs 5.9  1.7 - 7.7 K/uL   Lymphs Abs 2.8  0.7 - 4.0 K/uL   Monocytes Absolute 1.0  0.1 - 1.0 K/uL   Eosinophils Absolute 0.4  0.0 - 0.7 K/uL   Basophils Absolute 0.1  0.0 - 0.1 K/uL   RBC Morphology TARGET CELLS     Smear Review PLATELET COUNT CONFIRMED BY SMEAR    COMPREHENSIVE METABOLIC PANEL     Status: Abnormal   Collection Time    08/26/13  2:31 PM      Result Value Ref Range   Sodium 140  137 - 147 mEq/L   Potassium 4.8  3.7 - 5.3 mEq/L   Chloride 104  96 - 112 mEq/L   CO2 26  19 - 32 mEq/L   Glucose, Bld 96  70 - 99 mg/dL   BUN 16  6 - 23 mg/dL   Creatinine, Ser 7.821.06  0.50 - 1.35 mg/dL   Calcium 9.2  8.4 - 95.610.5 mg/dL   Total Protein 7.6  6.0 - 8.3 g/dL   Albumin 3.7  3.5 - 5.2 g/dL   AST 56 (*) 0 - 37 U/L   ALT 32  0 - 53 U/L   Alkaline Phosphatase 73  39 - 117 U/L   Total Bilirubin 0.7  0.3 - 1.2 mg/dL   GFR calc non Af Amer 81 (*) >90 mL/min   GFR calc Af Amer >90  >90 mL/min  LACTATE DEHYDROGENASE     Status: Abnormal   Collection Time     08/26/13  2:31 PM      Result Value Ref Range   LDH 479 (*) 94 - 250 U/L  RETICULOCYTES     Status: Abnormal   Collection Time    08/26/13  2:31 PM      Result Value Ref Range   Retic Ct Pct 5.1 (*) 0.4 - 3.1 %   RBC. 4.71  4.22 - 5.81 MIL/uL   Retic Count, Manual 240.2 (*)  19.0 - 186.0 K/uL    Imaging results:  No results found.   Assessment & Plan:  Sickle cell anemia with pain: Patient transitioned from primary care to day hospital for extended observation. Start hypotonic IVFs and IV Morphine 4 mg IV per primary physician. Will evaluate pain intensity closely during extended observation. Obtain CBC w/diff, CMP, LDH, and reticulocyte count.  Massie MaroonLachina M Hollis 08/26/2013, 3:58 PM

## 2013-08-26 NOTE — Progress Notes (Signed)
   Subjective:    Patient ID: Peter Kline, male    DOB: 08-30-1965, 48 y.o.   MRN: [id number]  HPI: Pt here with c/o increased pain in b/l knees and thighs and arms. Pt is described as throbbing and currently a 8-9/10. Pt states that he has been having the pain daily. He states that the current medication is not helping.  Pt states that he has been having priapism that's occurring about every other day that it lasts more than 2-3 hours.  Pt was last seen in 03/09/2013. He never had his labs drawn as requested and has since missed 3 scheduled appointments     Review of Systems  Constitutional: Negative.   HENT: Negative.   Eyes: Negative.   Respiratory: Negative.   Cardiovascular: Negative.   Gastrointestinal: Negative.   Endocrine: Negative.   Genitourinary: Negative.   Musculoskeletal: Positive for arthralgias and myalgias.  Skin: Negative.   Allergic/Immunologic: Negative.   Neurological: Negative.   Hematological: Negative.   Psychiatric/Behavioral: Negative.        Objective:   Physical Exam  Vitals reviewed. Constitutional: He is oriented to person, place, and time. He appears well-developed and well-nourished.  HENT:  Head: Atraumatic.  Eyes: Conjunctivae and EOM are normal. Pupils are equal, round, and reactive to light. No scleral icterus.  Neck: Normal range of motion. Neck supple.  Cardiovascular: Normal rate and regular rhythm.  Exam reveals no gallop and no friction rub.   No murmur heard. Pulmonary/Chest: Effort normal and breath sounds normal. He has no wheezes. He has no rales. He exhibits no tenderness.  Abdominal: Soft. Bowel sounds are normal. He exhibits no distension and no mass. There is no tenderness.  Musculoskeletal: Normal range of motion.  Neurological: He is alert and oriented to person, place, and time.  Skin: Skin is warm and dry.  Areas of hyperpigmentation that has been unchanged for several years  Psychiatric: He has a normal mood and  affect. His behavior is normal. Judgment and thought content normal.          Assessment & Plan:  1. Hb SS with Crisis: Will treat patient with IVF and IV Morphine in the Day Hospital. Will change analgesics to Oxycodone 5 mg q 4 hours PRN. Prescription given for 30 tabs. Continue Folic Acid. Pt still resistant to taking Hydrea  2. Priapism: Will check Hb and LDH. Also check Hemoglobinopathy. Pt still resistant to taking Hydrea.  3. Weight Loss: Pt has no change in appetite and has lost weight without trying. Will check metabolic Function and Hematology function.  RTC: 1 month  Labs: CBC with diff, CMET, LDH, Hemoglobin Electrophoresis, Ferritin  Altha HarmMichelle A Doral Digangi

## 2013-09-10 ENCOUNTER — Ambulatory Visit: Payer: Self-pay | Admitting: Family Medicine

## 2013-09-16 ENCOUNTER — Ambulatory Visit: Payer: Self-pay | Admitting: Family Medicine

## 2013-09-16 NOTE — H&P (Signed)
Pt seen and examined and assessment and plan discussed with NP Julianne Handler.  Altha Harm

## 2013-09-16 NOTE — Discharge Summary (Signed)
Pt seen and examined and discussed with NP Lachina Hollis. Agree with discharge home.  Eduar Kumpf A Annaleise Burger  

## 2013-09-23 ENCOUNTER — Ambulatory Visit: Payer: Self-pay | Admitting: Family Medicine

## 2013-10-01 ENCOUNTER — Encounter: Payer: Self-pay | Admitting: Family Medicine

## 2013-10-01 ENCOUNTER — Ambulatory Visit (INDEPENDENT_AMBULATORY_CARE_PROVIDER_SITE_OTHER): Payer: Medicaid Other | Admitting: Family Medicine

## 2013-10-01 VITALS — BP 122/76 | HR 75 | Temp 98.2°F | Resp 20 | Wt 174.0 lb

## 2013-10-01 DIAGNOSIS — F17200 Nicotine dependence, unspecified, uncomplicated: Secondary | ICD-10-CM

## 2013-10-01 DIAGNOSIS — R5383 Other fatigue: Secondary | ICD-10-CM | POA: Insufficient documentation

## 2013-10-01 DIAGNOSIS — N4830 Priapism, unspecified: Secondary | ICD-10-CM | POA: Diagnosis not present

## 2013-10-01 DIAGNOSIS — R748 Abnormal levels of other serum enzymes: Secondary | ICD-10-CM

## 2013-10-01 DIAGNOSIS — D57219 Sickle-cell/Hb-C disease with crisis, unspecified: Secondary | ICD-10-CM

## 2013-10-01 DIAGNOSIS — R3989 Other symptoms and signs involving the genitourinary system: Secondary | ICD-10-CM

## 2013-10-01 DIAGNOSIS — R634 Abnormal weight loss: Secondary | ICD-10-CM

## 2013-10-01 DIAGNOSIS — N4832 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: Secondary | ICD-10-CM

## 2013-10-01 DIAGNOSIS — R399 Unspecified symptoms and signs involving the genitourinary system: Secondary | ICD-10-CM

## 2013-10-01 DIAGNOSIS — D5720 Sickle-cell/Hb-C disease without crisis: Secondary | ICD-10-CM

## 2013-10-01 DIAGNOSIS — R5381 Other malaise: Secondary | ICD-10-CM

## 2013-10-01 LAB — COMPREHENSIVE METABOLIC PANEL
ALBUMIN: 3.9 g/dL (ref 3.5–5.2)
ALT: 21 U/L (ref 0–53)
AST: 26 U/L (ref 0–37)
Alkaline Phosphatase: 70 U/L (ref 39–117)
BUN: 14 mg/dL (ref 6–23)
CALCIUM: 9.3 mg/dL (ref 8.4–10.5)
CO2: 22 mEq/L (ref 19–32)
CREATININE: 0.88 mg/dL (ref 0.50–1.35)
Chloride: 105 mEq/L (ref 96–112)
Glucose, Bld: 86 mg/dL (ref 70–99)
POTASSIUM: 4.3 meq/L (ref 3.5–5.3)
Sodium: 139 mEq/L (ref 135–145)
Total Bilirubin: 0.9 mg/dL (ref 0.2–1.2)
Total Protein: 7.1 g/dL (ref 6.0–8.3)

## 2013-10-01 LAB — HEMOGLOBIN A1C
Hgb A1c MFr Bld: 4.4 % (ref <5.7)
Mean Plasma Glucose: 80 mg/dL (ref <117)

## 2013-10-01 LAB — TSH: TSH: 1.382 u[IU]/mL (ref 0.350–4.500)

## 2013-10-01 MED ORDER — GABAPENTIN 300 MG PO CAPS: 300.0000 mg | ORAL_CAPSULE | Freq: Three times a day (TID) | ORAL | Status: DC

## 2013-10-01 MED ORDER — OXYCODONE HCL 5 MG PO TABS: 5.0000 mg | ORAL_TABLET | ORAL | Status: DC | PRN

## 2013-10-01 MED ORDER — TERBUTALINE SULFATE 2.5 MG PO TABS: 2.5000 mg | ORAL_TABLET | Freq: Four times a day (QID) | ORAL | Status: DC | PRN

## 2013-10-01 MED ORDER — FOLIC ACID 1 MG PO TABS: 1.0000 mg | ORAL_TABLET | Freq: Every day | ORAL | Status: DC

## 2013-10-01 NOTE — Progress Notes (Signed)
Subjective:    Patient ID: Peter Kline, male    DOB: May 23, 1965, 48 y.o.   MRN: [id number]  HPI Patient presents for follow-up for a history of sickle cell anemia, HbSC. Patient states that he is currently having pain to bilateral lower extremity pain. Current pain intensity is 4-5/10 described as constant aching. Patient reports that he is taking medications consistently, but is currently out of pain medications. Report that he has not attempted any OTC interventions to alleviate symptoms.   Patient also complaining of fatigue. Report that he has been feeling fatigue over the past 2 weeks. He states that he has been working a great deal of long hours in a very hot environment. Reports that he has a strong family history of diabetes. Patient states that he has been attempting to go to bed earlier. Patient denies headache, dizziness, shortness of breath, nausea, vomiting, or diarrhea  He is complaining of GERD, which is occurring 2-3 times per week. Patient states that he has been taking his friends acid reflux medication periodically, which improves symptoms. Reports that he was on medications for GERD some years ago, but overall symptoms improved. Patient denies cough, abdominal pain,  nausea, vomiting, and diarrhea.   Lastly, patient states that he has been losing weight. Reports that he works long hours in a very warm environment, but has not changed his eating habits.    Review of Systems  Constitutional: Positive for fatigue and unexpected weight change (weight loss). Negative for diaphoresis, activity change and appetite change.  HENT: Negative.   Eyes: Negative.   Respiratory: Negative.   Cardiovascular: Negative.   Gastrointestinal: Positive for abdominal distention.       Reports heartburn 2-3 days per week  Endocrine: Negative.  Negative for cold intolerance, heat intolerance, polydipsia, polyphagia and polyuria.  Genitourinary: Negative.   Musculoskeletal: Positive for  myalgias (Pain is primarily in lower extremities).  Skin: Negative.   Allergic/Immunologic: Negative.   Neurological: Negative.   Hematological: Negative.   Psychiatric/Behavioral: Negative.        Objective:   Physical Exam  Vitals reviewed. Constitutional: He is oriented to person, place, and time. Vital signs are normal. He appears well-developed.  HENT:  Head: Normocephalic and atraumatic.  Right Ear: External ear normal.  Left Ear: External ear normal.  Eyes: Conjunctivae are normal. Pupils are equal, round, and reactive to light.  Neck: Normal range of motion. Neck supple.  Cardiovascular: Normal rate and normal heart sounds.   Pulmonary/Chest: Effort normal and breath sounds normal.  Abdominal: Soft. Bowel sounds are normal. There is no tenderness. There is no rebound and no CVA tenderness.  Musculoskeletal: Normal range of motion.  Neurological: He is alert and oriented to person, place, and time.  Skin: Skin is warm and dry.  Psychiatric: He has a normal mood and affect. His speech is normal and behavior is normal. Judgment and thought content normal.      BP 122/76  Pulse 75  Temp(Src) 98.2 F (36.8 C) (Oral)  Resp 20  Wt 174 lb (78.926 kg)     Assessment & Plan:  1. Sickle cell disease-We discussed the need for good hydration, monitoring of hydration status, avoidance of heat, cold, stress, and infection triggers. The patient was reminded of the need to seek medical attention of any symptoms of bleeding, anemia, or infection. Continue folic acid 1 mg daily to prevent aplastic bone marrow crises. He reports that he had a priapism 1 week ago that resolved within  2 hours. Discussed the fact that a priapism is a medical emergency and he should report to the emergency room with a priapism that last greater than 2 hours. Recommend that patient drinks 64 ounces of water per hour. Prescribed Oxycodone IR 5 mg every 4 hours prn #60. Reviewed [place] Substance Reporting system  prior to reorder   2. Fatigue: Patient states that he feels fatigued on most days. He generally sleeps 7-8 hours per night. Patient reports that he has occasional sweating.  Will check CBC, CMP. HgbA1c and TSH  3. Weight loss: Patient has had a 34 pound weight loss since last summer. He reports that his appetite is decreased at times. Will check TSH, Hba1C and hepatitis panel  4. GERD: Patient states that he has been having heartburn lately. He states that he take was previously taking Omeprazole with maximal relief. Will restart 8 week course. Will also refer to gastroenterology.  5. Pulmonary evaluation - Patient denies severe recurrent wheezes, shortness of breath with exercise, or persistent cough. If these symptoms develop, pulmonary function tests with spirometry will be ordered.  6. Cardiac - Routine screening for pulmonary hypertension is not recommended.  7. Eye - High risk of proliferative retinopathy. Annual eye exam with retinal exam recommended to patient. Will send referral for opthalmology  8. Tobacco dependence: He states that he smokes around 2-3 cigarettes per day. Patient started smoking again 6 months ago after quitting for 4 years. He states that he will attempt to stop smoking again. Discussed the cigarette smoking at it relates to sickle cell disease at length.   9. Immunization status - Up to date with immunizations.      RTC: 3 months  Referrals: Gastroenterology, Opthalmology  Labs: HbA1C, TSH, hepatitis panel, and CBC  Hollis,Lachina M, FNP

## 2013-10-02 LAB — CBC WITH DIFFERENTIAL/PLATELET
BASOS PCT: 1 % (ref 0–1)
Basophils Absolute: 0.1 10*3/uL (ref 0.0–0.1)
Eosinophils Absolute: 0.4 10*3/uL (ref 0.0–0.7)
Eosinophils Relative: 4 % (ref 0–5)
HEMATOCRIT: 39.8 % (ref 39.0–52.0)
HEMOGLOBIN: 13.6 g/dL (ref 13.0–17.0)
Lymphocytes Relative: 27 % (ref 12–46)
Lymphs Abs: 2.8 10*3/uL (ref 0.7–4.0)
MCH: 28.6 pg (ref 26.0–34.0)
MCHC: 34.2 g/dL (ref 30.0–36.0)
MCV: 83.8 fL (ref 78.0–100.0)
MONO ABS: 0.9 10*3/uL (ref 0.1–1.0)
MONOS PCT: 9 % (ref 3–12)
Neutro Abs: 6 10*3/uL (ref 1.7–7.7)
Neutrophils Relative %: 59 % (ref 43–77)
Platelets: 205 10*3/uL (ref 150–400)
RBC: 4.75 MIL/uL (ref 4.22–5.81)
RDW: 16.5 % — ABNORMAL HIGH (ref 11.5–15.5)
WBC: 10.2 10*3/uL (ref 4.0–10.5)

## 2013-10-02 LAB — HEPATITIS PANEL, ACUTE
HCV Ab: REACTIVE — AB
HEP A IGM: NONREACTIVE
Hep B C IgM: NONREACTIVE
Hepatitis B Surface Ag: NEGATIVE

## 2013-10-04 ENCOUNTER — Telehealth: Payer: Self-pay | Admitting: Internal Medicine

## 2013-10-04 NOTE — Telephone Encounter (Signed)
Patient has not received RX for ibuprofen nor RX for acid reflux . Would like all future RX called into [facility] [address],  [phone], as [facility] would not accept his insurance [facility]([employer]).

## 2013-10-05 ENCOUNTER — Telehealth: Payer: Self-pay

## 2013-10-05 DIAGNOSIS — K219 Gastro-esophageal reflux disease without esophagitis: Secondary | ICD-10-CM

## 2013-10-05 DIAGNOSIS — D5720 Sickle-cell/Hb-C disease without crisis: Secondary | ICD-10-CM

## 2013-10-05 LAB — HEMOGLOBINOPATHY EVALUATION
HEMOGLOBIN OTHER: 43.4 % — AB
HGB S QUANTITAION: 51.3 % — AB
Hgb A2 Quant: 3.7 % — ABNORMAL HIGH (ref 2.2–3.2)
Hgb A: 0 % — ABNORMAL LOW (ref 96.8–97.8)
Hgb F Quant: 1.6 % (ref 0.0–2.0)

## 2013-10-05 NOTE — Telephone Encounter (Signed)
Call Documentation      Wende NeighborsCharlene D Thomas at 10/04/2013  2:13 PM      Status: Signed            Patient has not received RX for ibuprofen nor RX for acid reflux . Would like all future RX called into [facility] [address],  [phone], as [facility] would not accept his Insurance.[facility]([employer])

## 2013-10-06 MED ORDER — IBUPROFEN 800 MG PO TABS: 800.0000 mg | ORAL_TABLET | Freq: Three times a day (TID) | ORAL | Status: DC | PRN

## 2013-10-06 MED ORDER — OMEPRAZOLE 40 MG PO CPDR: 40.0000 mg | DELAYED_RELEASE_CAPSULE | Freq: Every day | ORAL | Status: DC

## 2013-10-06 NOTE — Telephone Encounter (Signed)
Pres[facility] will [facility] [address]. We will change his pharmacy in the computer

## 2013-10-07 ENCOUNTER — Telehealth: Payer: Self-pay

## 2013-10-07 NOTE — Telephone Encounter (Signed)
Pt was notified of meds being sent to Pharm of his request.Pt's Pharm has been updated in system.

## 2013-10-08 ENCOUNTER — Telehealth: Payer: Self-pay | Admitting: Family Medicine

## 2013-10-08 DIAGNOSIS — R768 Other specified abnormal immunological findings in serum: Secondary | ICD-10-CM

## 2013-10-08 DIAGNOSIS — R748 Abnormal levels of other serum enzymes: Secondary | ICD-10-CM

## 2013-10-08 DIAGNOSIS — R5383 Other fatigue: Secondary | ICD-10-CM

## 2013-10-08 DIAGNOSIS — R399 Unspecified symptoms and signs involving the genitourinary system: Secondary | ICD-10-CM

## 2013-10-08 DIAGNOSIS — R5381 Other malaise: Secondary | ICD-10-CM

## 2013-10-08 DIAGNOSIS — R634 Abnormal weight loss: Secondary | ICD-10-CM

## 2013-10-08 NOTE — Telephone Encounter (Signed)
Patient will need to return to clinic for additional laboratory testing. HCV antibody test was positive, will need HCV qualitative test for confirmation. Also, will add HIV testing for weight loss. Attempted to contact patient via telephone, but voicemail has not been set up.

## 2013-10-18 ENCOUNTER — Telehealth: Payer: Self-pay | Admitting: Internal Medicine

## 2013-10-18 ENCOUNTER — Telehealth (HOSPITAL_COMMUNITY): Payer: Self-pay | Admitting: Hematology

## 2013-10-18 NOTE — Telephone Encounter (Signed)
Left message with the person who answered the phone to have patient call us back in regards to pain medication.

## 2013-10-18 NOTE — Telephone Encounter (Signed)
Refill request for terbutaline sulfate 2.5 mg tab; non-formulary approved medication; please consider changing to formulary medication

## 2013-10-18 NOTE — Telephone Encounter (Signed)
Pt had prescription refill for terbutaline on 10/01/2013 [facility]

## 2013-10-19 ENCOUNTER — Telehealth (HOSPITAL_COMMUNITY): Payer: Self-pay | Admitting: Hematology

## 2013-10-19 NOTE — Telephone Encounter (Signed)
Left message for patient advising that prescription for [facility], insurance has given an authorization.

## 2013-10-28 ENCOUNTER — Telehealth: Payer: Self-pay

## 2013-10-28 NOTE — Telephone Encounter (Signed)
Call Documentation     Peter MaroonLachina M Hollis, FNP at 10/08/2013 5:47 PM     Status: Signed        Patient will need to return to clinic for additional laboratory testing. HCV antibody test was positive, will need HCV qualitative test for confirmation. Also, will add HIV testing for weight loss. Attempted to contact patient via telephone, but voicemail has not been set up.        Pt contacted office this AM and made Appointment to come in to be seen W/ NP C.Kline Thurs 11/11/2013@1 :15p.m.

## 2013-11-08 ENCOUNTER — Other Ambulatory Visit: Payer: Self-pay

## 2013-11-08 MED ORDER — IBUPROFEN 800 MG PO TABS: 800.0000 mg | ORAL_TABLET | Freq: Three times a day (TID) | ORAL | Status: DC | PRN

## 2013-11-08 NOTE — Telephone Encounter (Signed)
Refilled rx for ibuprofen 800mg  sent to pharmacy via e-script. Thanks!

## 2013-11-11 ENCOUNTER — Ambulatory Visit (INDEPENDENT_AMBULATORY_CARE_PROVIDER_SITE_OTHER): Payer: Medicaid Other | Admitting: Family Medicine

## 2013-11-11 VITALS — BP 127/78 | HR 67 | Temp 98.3°F | Resp 16 | Ht 72.0 in | Wt 169.0 lb

## 2013-11-11 DIAGNOSIS — R5381 Other malaise: Secondary | ICD-10-CM

## 2013-11-11 DIAGNOSIS — R5383 Other fatigue: Secondary | ICD-10-CM

## 2013-11-11 DIAGNOSIS — R768 Other specified abnormal immunological findings in serum: Secondary | ICD-10-CM

## 2013-11-11 DIAGNOSIS — D5720 Sickle-cell/Hb-C disease without crisis: Secondary | ICD-10-CM

## 2013-11-11 DIAGNOSIS — D57219 Sickle-cell/Hb-C disease with crisis, unspecified: Secondary | ICD-10-CM

## 2013-11-11 DIAGNOSIS — R894 Abnormal immunological findings in specimens from other organs, systems and tissues: Secondary | ICD-10-CM

## 2013-11-11 DIAGNOSIS — R634 Abnormal weight loss: Secondary | ICD-10-CM

## 2013-11-11 LAB — COMPREHENSIVE METABOLIC PANEL
ALT: 19 U/L (ref 0–53)
AST: 25 U/L (ref 0–37)
Albumin: 3.9 g/dL (ref 3.5–5.2)
Alkaline Phosphatase: 73 U/L (ref 39–117)
BILIRUBIN TOTAL: 0.7 mg/dL (ref 0.2–1.2)
BUN: 15 mg/dL (ref 6–23)
CALCIUM: 9.3 mg/dL (ref 8.4–10.5)
CHLORIDE: 101 meq/L (ref 96–112)
CO2: 27 mEq/L (ref 19–32)
CREATININE: 0.88 mg/dL (ref 0.50–1.35)
Glucose, Bld: 89 mg/dL (ref 70–99)
Potassium: 3.8 mEq/L (ref 3.5–5.3)
Sodium: 131 mEq/L — ABNORMAL LOW (ref 135–145)
Total Protein: 7 g/dL (ref 6.0–8.3)

## 2013-11-11 MED ORDER — OXYCODONE HCL 5 MG PO TABS: 5.0000 mg | ORAL_TABLET | ORAL | Status: DC | PRN

## 2013-11-11 NOTE — Progress Notes (Signed)
Subjective:    Patient ID: Peter Kline, male    DOB: November 24, 1965, 48 y.o.   MRN: [id number]  HPI  Patient presents for follow-up for a history of sickle cell anemia, HbSC. Patient states that he is currently having pain to bilateral lower extremity pain. Current pain intensity is 4-5/10 described as constant aching. Patient reports that he is taking medications consistently, but is currently out of pain medications. Report that he has not attempted any OTC interventions to alleviate symptoms.   Patient also complaining of increased fatigue and weight loss. Report that he has been feeling fatigue over the past 2 weeks. He states that he has been working a great deal of long hours in a very hot environment. Reports that he has a strong family history of diabetes. Patient states that he has been attempting to go to bed earlier. Patient denies headache, dizziness, shortness of breath, nausea, vomiting, or diarrhea  Review of Systems  Constitutional: Positive for fatigue and unexpected weight change (weight loss). Negative for diaphoresis, activity change and appetite change.  HENT: Negative.   Eyes: Negative.   Respiratory: Negative.   Cardiovascular: Negative.   Gastrointestinal: Positive for abdominal distention.       Reports heartburn 2-3 days per week  Endocrine: Negative.  Negative for cold intolerance, heat intolerance, polydipsia, polyphagia and polyuria.  Genitourinary: Negative.   Musculoskeletal: Positive for myalgias (Pain is primarily in lower extremities).  Skin: Negative.   Allergic/Immunologic: Negative.   Neurological: Negative.   Hematological: Negative.   Psychiatric/Behavioral: Negative.        Objective:   Physical Exam  Vitals reviewed. Constitutional: He is oriented to person, place, and time. Vital signs are normal. He appears well-developed.  HENT:  Head: Normocephalic and atraumatic.  Right Ear: External ear normal.  Left Ear: External ear normal.   Eyes: Conjunctivae are normal. Pupils are equal, round, and reactive to light.  Neck: Normal range of motion. Neck supple.  Cardiovascular: Normal rate and normal heart sounds.   Pulmonary/Chest: Effort normal and breath sounds normal.  Abdominal: Soft. Bowel sounds are normal. There is no tenderness. There is no rebound and no CVA tenderness.  Musculoskeletal: Normal range of motion.  Neurological: He is alert and oriented to person, place, and time.  Skin: Skin is warm and dry.  Psychiatric: He has a normal mood and affect. His speech is normal and behavior is normal. Judgment and thought content normal.      BP 127/78  Pulse 67  Temp(Src) 98.3 F (36.8 C) (Oral)  Resp 16  Ht 6' (1.829 m)  Wt 169 lb (76.658 kg)  BMI 22.92 kg/m2     Assessment & Plan:  1. Sickle cell disease-We discussed the need for good hydration, monitoring of hydration status, avoidance of heat, cold, stress, and infection triggers. The patient was reminded of the need to seek medical attention of any symptoms of bleeding, anemia, or infection. Continue folic acid 1 mg daily to prevent aplastic bone marrow crises. He reports that he had a priapism 2 weeks ago that resolved within 2 hours. Discussed the fact that a priapism is a medical emergency and he should report to the emergency room with a priapism that last greater than 2 hours. Recommend that patient drinks 64 ounces of water per hour. Prescribed Oxycodone IR 5 mg every 4 hours prn #60. Reviewed [facility] Substance Reporting system prior to reorder   2. Fatigue: Patient states that he feels fatigued on most days. He  generally sleeps 7-8 hours per night. Patient reports that he has occasional night sweats. Patient denies a past history of IV Drug use, but does report a long history of unprotected sexual encounters. Positive HCV antibodies, will send confirmation test HCV qualitative by PCR.     3. Weight loss: Patient has had a 34 pound weight loss since last  summer. He reports that his appetite is decreased at times. TSH, Hba1C wnl, will send HCV qualitative test and HIV.   4. GERD: Patient states that heartburn has improved on medication regimen  5. Eye - High risk of proliferative retinopathy. Annual eye exam with retinal exam recommended to patient. Will send referral for opthalmology   8. Tobacco dependence: He states that he smokes around 4-5 cigarettes per day. Patient started smoking again 6 months ago after quitting for 4 years. He states that he will attempt to stop smoking again. Discussed the cigarette smoking at it relates to sickle cell disease at length.   9. Immunization status - Up to date with immunizations. Patient is in the process of getting a hepatitis B series with his current job     RTC: 3 months Referrals: Opthalmology  Opthalmology  Labs:HCV quant, HIV, CMP and CBC  Netta Fodge M, FNP   Outpatient Encounter Prescriptions as of 11/11/2013  Medication Sig  . folic acid (FOLVITE) 1 MG tablet Take 1 tablet (1 mg total) by mouth daily.  Marland Kitchen. gabapentin (NEURONTIN) 300 MG capsule Take 1 capsule (300 mg total) by mouth 3 (three) times daily.  Marland Kitchen. ibuprofen (ADVIL,MOTRIN) 800 MG tablet Take 1 tablet (800 mg total) by mouth every 8 (eight) hours as needed for mild pain.  Marland Kitchen. omeprazole (PRILOSEC) 40 MG capsule Take 1 capsule (40 mg total) by mouth daily.  Marland Kitchen. oxyCODONE (OXY IR/ROXICODONE) 5 MG immediate release tablet Take 1 tablet (5 mg total) by mouth every 4 (four) hours as needed for severe pain.  . [DISCONTINUED] oxyCODONE (OXY IR/ROXICODONE) 5 MG immediate release tablet Take 1 tablet (5 mg total) by mouth every 4 (four) hours as needed for severe pain.  . [DISCONTINUED] oxyCODONE (OXY IR/ROXICODONE) 5 MG immediate release tablet Take 1 tablet (5 mg total) by mouth every 4 (four) hours as needed for severe pain.  Marland Kitchen. terbutaline (BRETHINE) 2.5 MG tablet Take 1 tablet (2.5 mg total) by mouth every 6 (six) hours as needed  (Priapism).

## 2013-11-12 ENCOUNTER — Encounter: Payer: Self-pay | Admitting: Family Medicine

## 2013-11-12 DIAGNOSIS — R768 Other specified abnormal immunological findings in serum: Secondary | ICD-10-CM | POA: Insufficient documentation

## 2013-11-12 LAB — CBC WITH DIFFERENTIAL/PLATELET
Basophils Absolute: 0.1 10*3/uL (ref 0.0–0.1)
Basophils Relative: 1 % (ref 0–1)
EOS PCT: 4 % (ref 0–5)
Eosinophils Absolute: 0.5 10*3/uL (ref 0.0–0.7)
HEMATOCRIT: 37.2 % — AB (ref 39.0–52.0)
HEMOGLOBIN: 13 g/dL (ref 13.0–17.0)
LYMPHS PCT: 22 % (ref 12–46)
Lymphs Abs: 2.8 10*3/uL (ref 0.7–4.0)
MCH: 28.8 pg (ref 26.0–34.0)
MCHC: 34.9 g/dL (ref 30.0–36.0)
MCV: 82.3 fL (ref 78.0–100.0)
MONO ABS: 1 10*3/uL (ref 0.1–1.0)
MONOS PCT: 8 % (ref 3–12)
Neutro Abs: 8.1 10*3/uL — ABNORMAL HIGH (ref 1.7–7.7)
Neutrophils Relative %: 65 % (ref 43–77)
Platelets: 291 10*3/uL (ref 150–400)
RBC: 4.52 MIL/uL (ref 4.22–5.81)
RDW: 15.9 % — ABNORMAL HIGH (ref 11.5–15.5)
WBC: 12.5 10*3/uL — ABNORMAL HIGH (ref 4.0–10.5)

## 2013-11-12 LAB — HIV ANTIBODY (ROUTINE TESTING W REFLEX): HIV 1&2 Ab, 4th Generation: NONREACTIVE

## 2013-11-19 ENCOUNTER — Telehealth: Payer: Self-pay

## 2013-11-19 NOTE — Telephone Encounter (Signed)
LEFT MESSAGE WITH PATIENT'S GIRLFRIEND TO HAVE PATIENT GIVE US A CALL. PATIENT NEEDS TO COME BACK IN FOR LAB RE-DRAW. THANKS!

## 2013-12-02 ENCOUNTER — Telehealth: Payer: Self-pay | Admitting: Internal Medicine

## 2013-12-02 DIAGNOSIS — D57219 Sickle-cell/Hb-C disease with crisis, unspecified: Secondary | ICD-10-CM

## 2013-12-02 MED ORDER — OXYCODONE HCL 5 MG PO TABS: 5.0000 mg | ORAL_TABLET | ORAL | Status: DC | PRN

## 2013-12-02 NOTE — Telephone Encounter (Signed)
Meds ordered this encounter  Medications  . oxyCODONE (OXY IR/ROXICODONE) 5 MG immediate release tablet    Sig: Take 1 tablet (5 mg total) by mouth every 4 (four) hours as needed for severe pain.    Dispense:  60 tablet    Refill:  0  Reviewed [place] Substance Reporting system prior to reorder   Massie MaroonHollis,Brynnly Bonet M, FNP

## 2013-12-02 NOTE — Telephone Encounter (Signed)
Refill request for oxycodone 5mg . LOV 11/11/2013. Please advise. Thanks!

## 2014-01-04 ENCOUNTER — Telehealth: Payer: Self-pay

## 2014-01-04 DIAGNOSIS — R768 Other specified abnormal immunological findings in serum: Secondary | ICD-10-CM

## 2014-01-04 DIAGNOSIS — D57219 Sickle-cell/Hb-C disease with crisis, unspecified: Secondary | ICD-10-CM

## 2014-01-04 NOTE — Telephone Encounter (Signed)
Refill request for oxycodone. LOV 11/11/2013. Please advise. Thanks!

## 2014-01-06 MED ORDER — OXYCODONE HCL 5 MG PO TABS: 5.0000 mg | ORAL_TABLET | ORAL | Status: DC | PRN

## 2014-01-06 NOTE — Telephone Encounter (Signed)
Meds ordered this encounter  Medications  . oxyCODONE (OXY IR/ROXICODONE) 5 MG immediate release tablet    Sig: Take 1 tablet (5 mg total) by mouth every 4 (four) hours as needed for severe pain.    Dispense:  60 tablet    Refill:  0  Reviewed [facility] Substance Reporting system prior to reorder Massie Maroon, FNP

## 2014-01-13 ENCOUNTER — Other Ambulatory Visit: Payer: PRIVATE HEALTH INSURANCE

## 2014-01-17 ENCOUNTER — Telehealth (HOSPITAL_COMMUNITY): Payer: Self-pay | Admitting: *Deleted

## 2014-01-17 NOTE — Telephone Encounter (Signed)
Received patient call. Patient c/o joint pain in legs and arms, back pain. Phone call dropped during patient triage. To attempt to contact patient.

## 2014-01-17 NOTE — Telephone Encounter (Signed)
This RN attempted to return call to patient. Unable to contact patient via numbers provided; Mobile phone number disconnected, home phone number person states [place] does not live there.

## 2014-01-24 ENCOUNTER — Encounter: Payer: Self-pay | Admitting: Family Medicine

## 2014-01-24 ENCOUNTER — Ambulatory Visit (INDEPENDENT_AMBULATORY_CARE_PROVIDER_SITE_OTHER): Payer: Medicaid Other | Admitting: Family Medicine

## 2014-01-24 VITALS — BP 132/77 | HR 66 | Temp 98.6°F | Resp 16 | Ht 72.0 in | Wt 168.0 lb

## 2014-01-24 DIAGNOSIS — R059 Cough, unspecified: Secondary | ICD-10-CM

## 2014-01-24 DIAGNOSIS — J069 Acute upper respiratory infection, unspecified: Secondary | ICD-10-CM

## 2014-01-24 DIAGNOSIS — R52 Pain, unspecified: Secondary | ICD-10-CM

## 2014-01-24 DIAGNOSIS — F17200 Nicotine dependence, unspecified, uncomplicated: Secondary | ICD-10-CM

## 2014-01-24 DIAGNOSIS — D5720 Sickle-cell/Hb-C disease without crisis: Secondary | ICD-10-CM

## 2014-01-24 DIAGNOSIS — R0981 Nasal congestion: Secondary | ICD-10-CM

## 2014-01-24 DIAGNOSIS — R634 Abnormal weight loss: Secondary | ICD-10-CM

## 2014-01-24 DIAGNOSIS — R05 Cough: Secondary | ICD-10-CM

## 2014-01-24 DIAGNOSIS — K219 Gastro-esophageal reflux disease without esophagitis: Secondary | ICD-10-CM

## 2014-01-24 DIAGNOSIS — R894 Abnormal immunological findings in specimens from other organs, systems and tissues: Secondary | ICD-10-CM

## 2014-01-24 DIAGNOSIS — R5383 Other fatigue: Secondary | ICD-10-CM

## 2014-01-24 DIAGNOSIS — R768 Other specified abnormal immunological findings in serum: Secondary | ICD-10-CM

## 2014-01-24 HISTORY — DX: Nicotine dependence, unspecified, uncomplicated: F17.200

## 2014-01-24 MED ORDER — AZITHROMYCIN 250 MG PO TABS: ORAL_TABLET | ORAL | Status: DC

## 2014-01-24 MED ORDER — DEXTROMETHORPHAN HBR 15 MG/5ML PO SYRP: 10.0000 mL | ORAL_SOLUTION | Freq: Three times a day (TID) | ORAL | Status: DC | PRN

## 2014-01-24 NOTE — Progress Notes (Signed)
Subjective:    Patient ID: Peter Kline, male    DOB: 05/30/1965, 48 y.o.   MRN: [id number]  HPI  Patient presents for follow-up for a history of sickle cell anemia, HbSC presents complaining of nasal congestion, cough, fatigue, and post nasal drip. . Patient states that he is currently having pain to bilateral lower extremity pain. Current pain intensity is 6-7/10 described as constant aching. Patient reports that he is taking medications consistently, but is currently out of pain medications. Report that he has not attempted any OTC interventions to alleviate symptoms.   Patient also continues to complain of increased fatigue and weight loss. Report that he has been feeling fatigue over the past 2 to 3 months. 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 Patient was recently tested for Hepatitis C. Hep C antibody test was positive. Requested that patient return for follow up testing. He states that he missed his lab appointment due to having to work extensive hours. Patient denies headache, dizziness, shortness of breath. He reports periodic nausea and  vomiting  Past Medical History  Diagnosis Date  . Sickle cell anemia   . Shortness of breath     Review of Systems  Constitutional: Positive for unexpected weight change (weight loss).  HENT: Positive for postnasal drip.        Nasal congestion  Respiratory: Positive for cough. Negative for shortness of breath.   Cardiovascular: Negative.   Gastrointestinal: Positive for nausea and vomiting (biliious vomiting 3 days ago).  Endocrine: Negative.   Genitourinary: Negative.   Musculoskeletal: Negative.   Skin: Negative.   Neurological: Negative.   Hematological: Negative.   Psychiatric/Behavioral: Negative.        Objective:   Physical Exam  Constitutional: Vital signs are normal. He appears well-developed and well-nourished. He has a sickly appearance. He appears ill.  HENT:  Head:  Normocephalic and atraumatic.  Right Ear: Hearing, tympanic membrane, external ear and ear canal normal. No drainage. No decreased hearing is noted.  Left Ear: Hearing, tympanic membrane, external ear and ear canal normal. No drainage. No decreased hearing is noted.  Nose: Mucosal edema and rhinorrhea present.  Mouth/Throat: Oropharyngeal exudate present.  Eyes: Conjunctivae and EOM are normal. Pupils are equal, round, and reactive to light. No scleral icterus.  Neck: Normal range of motion. Neck supple.  Cardiovascular: Regular rhythm, normal heart sounds and intact distal pulses.   No murmur heard. Pulmonary/Chest: Effort normal and breath sounds normal. No respiratory distress. He has no wheezes. He has no rales.  Abdominal: Soft. Normal appearance. There is tenderness. There is no rigidity, no guarding, no tenderness at McBurney's point and negative Murphy's sign.  Skin: Skin is warm and dry.  Psychiatric: He has a normal mood and affect. His behavior is normal. Judgment and thought content normal.      BP 132/77  Pulse 66  Temp(Src) 98.6 F (37 C) (Oral)  Resp 16  Ht 6' (1.829 m)  Wt 168 lb (76.204 kg)  BMI 22.78 kg/m2    Assessment & Plan:  1. Sickle cell disease, type Peter Kline, without crisis Patient states that he is having increased joint pain today. Recommend that patient increase hydration and take Oxycodone 10 mg as previously prescribed.   - CBC with Differential - COMPLETE METABOLIC PANEL WITH GFR  2. HCV antibody positive Patient did not return for testing. Will collect confirmation test.  - HCV RNA NAA Qualitative  3.  Other fatigue Patient states that he feels fatigued on most days. He generally sleeps 7-8 hours per night. Patient reports that he has occasional night sweats. Patient denies a past history of IV Drug use, but does report a long history of unprotected sexual encounters. Positive HCV antibodies, will send confirmation test HCV qualitative by PCR.   4.  Nasal congestion Recommend OTC nasal saline twice daily as needed for nasal congestion.   5. Loss of weight Patient has had a 34 pound weight loss since last summer. He reports that his appetite is decreased at times. TSH, HbA1C, and HIV wnl.  - Ambulatory referral to Gastroenterology  6. Gastroesophageal reflux disease without esophagitis Patient continues to have GERD. He reports bilious vomiting 1 week ago.  - Ambulatory referral to Gastroenterology  7. Acute upper respiratory infection Recommend that patient increase fluids,  - azithromycin (ZITHROMAX) 250 MG tablet; Day 1 500 mg; Days 2-5 250 mg.  Dispense: 6 tablet; Refill: 0  8. Cough  - dextromethorphan 15 MG/5ML syrup; Take 10 mLs (30 mg total) by mouth 3 (three) times daily as needed for cough.  Dispense: 120 mL; Refill: 0 - POCT Influenza A/B  9. Body aches  - POCT Influenza A/B  10. Tobacco dependence:  He states that he smokes around 4-5 cigarettes per day. Patient started smoking again 6 months ago after quitting for 4 years. He states that he will attempt to stop smoking again. Discussed the cigarette smoking at it relates to sickle cell disease at length.      Massie MaroonHollis,Larrisa Cravey M, FNP

## 2014-01-24 NOTE — Patient Instructions (Signed)
Cough, Adult  A cough is a reflex that helps clear your throat and airways. It can help heal the body or may be a reaction to an irritated airway. A cough may only last 2 or 3 weeks (acute) or may last more than 8 weeks (chronic).  CAUSES Acute cough:  Viral or bacterial infections. Chronic cough:  Infections.  Allergies.  Asthma.  Post-nasal drip.  Smoking.  Heartburn or acid reflux.  Some medicines.  Chronic lung problems (COPD).  Cancer. SYMPTOMS   Cough.  Fever.  Chest pain.  Increased breathing rate.  High-pitched whistling sound when breathing (wheezing).  Colored mucus that you cough up (sputum). TREATMENT   A bacterial cough may be treated with antibiotic medicine.  A viral cough must run its course and will not respond to antibiotics.  Your caregiver may recommend other treatments if you have a chronic cough. HOME CARE INSTRUCTIONS   Only take over-the-counter or prescription medicines for pain, discomfort, or fever as directed by your caregiver. Use cough suppressants only as directed by your caregiver.  Use a cold steam vaporizer or humidifier in your bedroom or home to help loosen secretions.  Sleep in a semi-upright position if your cough is worse at night.  Rest as needed.  Stop smoking if you smoke. SEEK IMMEDIATE MEDICAL CARE IF:   You have pus in your sputum.  Your cough starts to worsen.  You cannot control your cough with suppressants and are losing sleep.  You begin coughing up blood.  You have difficulty breathing.  You develop pain which is getting worse or is uncontrolled with medicine.  You have a fever. MAKE SURE YOU:   Understand these instructions.  Will watch your condition.  Will get help right away if you are not doing well or get worse. Document Released: 09/28/2010 Document Revised: 06/24/2011 Document Reviewed: 09/28/2010 ExitCare Patient Information 2015 [employer]. This information is not intended  to replace advice given to you by your health care Vaida Kerchner. Make sure you discuss any questions you have with your health care Maximino Cozzolino.  

## 2014-01-25 LAB — COMPLETE METABOLIC PANEL WITH GFR
ALT: 19 U/L (ref 0–53)
AST: 24 U/L (ref 0–37)
Albumin: 4.1 g/dL (ref 3.5–5.2)
Alkaline Phosphatase: 66 U/L (ref 39–117)
BUN: 8 mg/dL (ref 6–23)
CALCIUM: 9.5 mg/dL (ref 8.4–10.5)
CO2: 29 meq/L (ref 19–32)
CREATININE: 0.84 mg/dL (ref 0.50–1.35)
Chloride: 102 mEq/L (ref 96–112)
GFR, Est African American: >89 mL/min
GLUCOSE: 70 mg/dL (ref 70–99)
POTASSIUM: 3.8 meq/L (ref 3.5–5.3)
Sodium: 140 mEq/L (ref 135–145)
Total Bilirubin: 0.9 mg/dL (ref 0.2–1.2)
Total Protein: 6.8 g/dL (ref 6.0–8.3)

## 2014-01-25 LAB — CBC WITH DIFFERENTIAL/PLATELET
Basophils Absolute: 0.1 10*3/uL (ref 0.0–0.1)
Basophils Relative: 1 % (ref 0–1)
EOS PCT: 8 % — AB (ref 0–5)
Eosinophils Absolute: 0.9 10*3/uL — ABNORMAL HIGH (ref 0.0–0.7)
HEMATOCRIT: 38.7 % — AB (ref 39.0–52.0)
Hemoglobin: 13 g/dL (ref 13.0–17.0)
Lymphocytes Relative: 25 % (ref 12–46)
Lymphs Abs: 2.9 10*3/uL (ref 0.7–4.0)
MCH: 28.6 pg (ref 26.0–34.0)
MCHC: 33.6 g/dL (ref 30.0–36.0)
MCV: 85.2 fL (ref 78.0–100.0)
MONO ABS: 0.9 10*3/uL (ref 0.1–1.0)
Monocytes Relative: 8 % (ref 3–12)
Neutro Abs: 6.7 10*3/uL (ref 1.7–7.7)
Neutrophils Relative %: 58 % (ref 43–77)
Platelets: 227 10*3/uL (ref 150–400)
RBC: 4.54 MIL/uL (ref 4.22–5.81)
RDW: 15.5 % (ref 11.5–15.5)
WBC: 11.6 10*3/uL — AB (ref 4.0–10.5)

## 2014-01-27 ENCOUNTER — Other Ambulatory Visit: Payer: PRIVATE HEALTH INSURANCE

## 2014-01-29 ENCOUNTER — Emergency Department (HOSPITAL_COMMUNITY)
Admission: EM | Admit: 2014-01-29 | Discharge: 2014-01-29 | Disposition: A | Discharge status: Home / Self Care | Payer: Medicaid Other | Attending: Emergency Medicine | Admitting: Emergency Medicine

## 2014-01-29 ENCOUNTER — Encounter (HOSPITAL_COMMUNITY): Payer: Self-pay | Admitting: Emergency Medicine

## 2014-01-29 DIAGNOSIS — Z79899 Other long term (current) drug therapy: Secondary | ICD-10-CM | POA: Diagnosis not present

## 2014-01-29 DIAGNOSIS — R1084 Generalized abdominal pain: Secondary | ICD-10-CM | POA: Insufficient documentation

## 2014-01-29 DIAGNOSIS — D571 Sickle-cell disease without crisis: Secondary | ICD-10-CM | POA: Insufficient documentation

## 2014-01-29 DIAGNOSIS — R197 Diarrhea, unspecified: Secondary | ICD-10-CM | POA: Diagnosis not present

## 2014-01-29 DIAGNOSIS — R768 Other specified abnormal immunological findings in serum: Secondary | ICD-10-CM | POA: Diagnosis not present

## 2014-01-29 DIAGNOSIS — R1010 Upper abdominal pain, unspecified: Secondary | ICD-10-CM

## 2014-01-29 DIAGNOSIS — Z720 Tobacco use: Secondary | ICD-10-CM | POA: Insufficient documentation

## 2014-01-29 DIAGNOSIS — R112 Nausea with vomiting, unspecified: Secondary | ICD-10-CM | POA: Insufficient documentation

## 2014-01-29 DIAGNOSIS — Z9889 Other specified postprocedural states: Secondary | ICD-10-CM | POA: Insufficient documentation

## 2014-01-29 LAB — URINALYSIS, ROUTINE W REFLEX MICROSCOPIC
BILIRUBIN URINE: NEGATIVE
GLUCOSE, UA: NEGATIVE mg/dL
Hgb urine dipstick: NEGATIVE
Ketones, ur: NEGATIVE mg/dL
Leukocytes, UA: NEGATIVE
Nitrite: NEGATIVE
PH: 7 (ref 5.0–8.0)
PROTEIN: NEGATIVE mg/dL
Specific Gravity, Urine: 1.014 (ref 1.005–1.030)
Urobilinogen, UA: 1 mg/dL (ref 0.0–1.0)

## 2014-01-29 LAB — CBC WITH DIFFERENTIAL/PLATELET
BASOS PCT: 1 % (ref 0–1)
Basophils Absolute: 0.1 10*3/uL (ref 0.0–0.1)
EOS ABS: 0.7 10*3/uL (ref 0.0–0.7)
Eosinophils Relative: 5 % (ref 0–5)
HEMATOCRIT: 38.7 % — AB (ref 39.0–52.0)
Hemoglobin: 14.2 g/dL (ref 13.0–17.0)
LYMPHS ABS: 2.6 10*3/uL (ref 0.7–4.0)
Lymphocytes Relative: 19 % (ref 12–46)
MCH: 29.2 pg (ref 26.0–34.0)
MCHC: 36.7 g/dL — AB (ref 30.0–36.0)
MCV: 79.6 fL (ref 78.0–100.0)
Monocytes Absolute: 1.7 10*3/uL — ABNORMAL HIGH (ref 0.1–1.0)
Monocytes Relative: 12 % (ref 3–12)
NEUTROS ABS: 8.8 10*3/uL — AB (ref 1.7–7.7)
Neutrophils Relative %: 63 % (ref 43–77)
Platelets: 244 10*3/uL (ref 150–400)
RBC: 4.86 MIL/uL (ref 4.22–5.81)
RDW: 15.2 % (ref 11.5–15.5)
WBC: 13.9 10*3/uL — ABNORMAL HIGH (ref 4.0–10.5)

## 2014-01-29 LAB — COMPREHENSIVE METABOLIC PANEL
ALT: 40 U/L (ref 0–53)
AST: 59 U/L — AB (ref 0–37)
Albumin: 4 g/dL (ref 3.5–5.2)
Alkaline Phosphatase: 73 U/L (ref 39–117)
Anion gap: 13 (ref 5–15)
BILIRUBIN TOTAL: 0.8 mg/dL (ref 0.3–1.2)
BUN: 16 mg/dL (ref 6–23)
CHLORIDE: 103 meq/L (ref 96–112)
CO2: 25 meq/L (ref 19–32)
Calcium: 9.7 mg/dL (ref 8.4–10.5)
Creatinine, Ser: 0.8 mg/dL (ref 0.50–1.35)
GFR calc non Af Amer: >90 mL/min (ref >90)
GLUCOSE: 89 mg/dL (ref 70–99)
Potassium: 5 mEq/L (ref 3.7–5.3)
Sodium: 141 mEq/L (ref 137–147)
Total Protein: 8.2 g/dL (ref 6.0–8.3)

## 2014-01-29 LAB — LIPASE, BLOOD: Lipase: 31 U/L (ref 11–59)

## 2014-01-29 NOTE — Discharge Instructions (Signed)

## 2014-01-29 NOTE — ED Provider Notes (Signed)
CSN: [id number]     Arrival date & time 01/29/14  1824 History   First MD Initiated Contact with Patient 01/29/14 1910     Chief Complaint  Patient presents with  . Abdominal Pain  . Nausea  . Emesis  . Diarrhea     (Consider location/radiation/quality/duration/timing/severity/associated sxs/prior Treatment) HPI Patient poor she has a generalized central and upper abdominal pain for about 3 days now. He reports it is she seems to gotten better at this point. The patient reports he's had about 2-3 episodes of vomiting each day there's been no associated fever. The patient denies diarrhea. He reports he had actually taken a laxative and started to have some regular bowel movements. He has not had any fever. The patient has recently seen by his family physician. At that time he did also receive a diagnosis of hep C. and also has a diagnosis of sickle cell anemia. At this point other review systems are negative. Past Medical History  Diagnosis Date  . Sickle cell anemia   . Shortness of breath    Past Surgical History  Procedure Laterality Date  . Laparoscopic gastrotomy w/ repair of ulcer     Family History  Problem Relation Age of Onset  . Diabetes Mother   . Hypertension Mother    History  Substance Use Topics  . Smoking status: Current Every Day Smoker -- 0.50 packs/day    Types: Cigarettes  . Smokeless tobacco: Never Used  . Alcohol Use: 3.6 oz/week    6 Cans of beer per week    Review of Systems  10 Systems reviewed and are negative for acute change except as noted in the HPI.   Allergies  Review of patient's allergies indicates no known allergies.  Home Medications   Prior to Admission medications   Medication Sig Start Date End Date Taking? Authorizing Provider  folic acid (FOLVITE) 1 MG tablet Take 1 tablet (1 mg total) by mouth daily. 10/01/13  Yes Massie MaroonLachina M Hollis, FNP  gabapentin (NEURONTIN) 300 MG capsule Take 1 capsule (300 mg total) by mouth 3 (three)  times daily. 10/01/13  Yes Massie MaroonLachina M Hollis, FNP  GuaiFENesin (COUGH SYRUP PO) Take 10 mLs by mouth 2 (two) times daily as needed (cough).   Yes Historical Provider, MD  ibuprofen (ADVIL,MOTRIN) 800 MG tablet Take 800 mg by mouth daily as needed for mild pain or moderate pain (pain).   Yes Historical Provider, MD  oxyCODONE (OXY IR/ROXICODONE) 5 MG immediate release tablet Take 5 mg by mouth every 4 (four) hours as needed for severe pain (pain).   Yes Historical Provider, MD  Phenyleph-Doxylamine-DM-APAP (ALKA SELTZER PLUS PO) Take 1 capsule by mouth daily as needed (cough & cold symptoms).    Yes Historical Provider, MD   BP 130/88  Pulse 66  Temp(Src) 98.6 F (37 C) (Oral)  Resp 16  SpO2 100% Physical Exam  Constitutional: He is oriented to person, place, and time. He appears well-developed and well-nourished.  Currently the patient has well appearance. He does not appear to be any distress. His color is good he has no respiratory distress.  HENT:  Head: Normocephalic and atraumatic.  Eyes: EOM are normal. Pupils are equal, round, and reactive to light.  Neck: Neck supple.  Cardiovascular: Normal rate, regular rhythm, normal heart sounds and intact distal pulses.   Pulmonary/Chest: Effort normal and breath sounds normal.  Abdominal: Soft. Bowel sounds are normal. He exhibits no distension. There is no tenderness.  Musculoskeletal: Normal range  of motion. He exhibits no edema.  Neurological: He is alert and oriented to person, place, and time. He has normal strength. Coordination normal. GCS eye subscore is 4. GCS verbal subscore is 5. GCS motor subscore is 6.  Skin: Skin is warm, dry and intact.  Psychiatric: He has a normal mood and affect.    ED Course  Procedures (including critical care time) Labs Review Labs Reviewed  CBC WITH DIFFERENTIAL - Abnormal; Notable for the following:    WBC 13.9 (*)    HCT 38.7 (*)    MCHC 36.7 (*)    Neutro Abs 8.8 (*)    Monocytes Absolute 1.7  (*)    All other components within normal limits  COMPREHENSIVE METABOLIC PANEL - Abnormal; Notable for the following:    AST 59 (*)    All other components within normal limits  LIPASE, BLOOD  URINALYSIS, ROUTINE W REFLEX MICROSCOPIC  RETICULOCYTES    Imaging Review No results found.   EKG Interpretation None      MDM   Final diagnoses:  Pain of upper abdomen  Hepatitis C antibody test positive  Hb-SS disease without crisis   At this point the patient feels improved. He reports the symptoms were worse a couple of days ago. He does have a leukocytosis but does not have a surgical abdominal examination. He does not appear to be having a sickle cell crisis was improving pain and decreasing frequency of vomiting. At this point the patient has been counseled if he has recurrence or worsening of symptoms he is to return to the emergency department, however as his symptoms seem to be resolving he will be discharged with abdominal pain precautions. The patient has a known hepatitis C positive antibody test, his family doctor is working with him for further diagnostic evaluation and referrals.    Arby BarretteMarcy Jeniece Hannis, MD 01/29/14 2221

## 2014-01-29 NOTE — ED Notes (Signed)
Pt c/o mid abd pain, NVD x 3 days. 

## 2014-02-01 ENCOUNTER — Telehealth: Payer: Self-pay

## 2014-02-01 ENCOUNTER — Telehealth: Payer: Self-pay | Admitting: Internal Medicine

## 2014-02-01 DIAGNOSIS — D5720 Sickle-cell/Hb-C disease without crisis: Secondary | ICD-10-CM

## 2014-02-01 MED ORDER — IBUPROFEN 800 MG PO TABS: 800.0000 mg | ORAL_TABLET | Freq: Every day | ORAL | Status: DC | PRN

## 2014-02-01 MED ORDER — FOLIC ACID 1 MG PO TABS: 1.0000 mg | ORAL_TABLET | Freq: Every day | ORAL | Status: DC

## 2014-02-01 MED ORDER — GABAPENTIN 300 MG PO CAPS: 300.0000 mg | ORAL_CAPSULE | Freq: Three times a day (TID) | ORAL | Status: DC

## 2014-02-01 MED ORDER — OXYCODONE HCL 5 MG PO TABS: 5.0000 mg | ORAL_TABLET | ORAL | Status: DC | PRN

## 2014-02-01 NOTE — Telephone Encounter (Signed)
Refill request for oxycodone. LOV 01/24/2014. Please advise. Thanks!

## 2014-02-01 NOTE — Telephone Encounter (Signed)
Oxycodone request sent to provider earlier today and all other medications refilled via e-script. Thanks!

## 2014-02-01 NOTE — Telephone Encounter (Signed)
Meds ordered this encounter  Medications  . oxyCODONE (OXY IR/ROXICODONE) 5 MG immediate release tablet    Sig: Take 1 tablet (5 mg total) by mouth every 4 (four) hours as needed for severe pain (pain).    Dispense:  60 tablet    Refill:  0    Order Specific Question:  Supervising Provider    Answer:  Jacqulyn LinerMATHEWS, MICHELLE M [[id number]]  Reviewed [facility] Substance Reporting system prior to reorder Massie MaroonHollis,Chea Malan M, FNP

## 2014-02-02 ENCOUNTER — Telehealth (HOSPITAL_COMMUNITY): Payer: Self-pay | Admitting: Hematology

## 2014-02-02 NOTE — Telephone Encounter (Signed)
Calling patient to advise to come in tomorrow to have labs redrawn.  No answer on home machine, mobile number [phone] has recording saying disconnected.  Will attempt to reach the patient at a later time.

## 2014-02-03 ENCOUNTER — Telehealth (HOSPITAL_COMMUNITY): Payer: Self-pay | Admitting: Hematology

## 2014-02-03 NOTE — Telephone Encounter (Signed)
Called number listed as home number, and the person who answered stated that the patient didn't live there and she did not have a contact number for him.  I then called the number listed as a mobile number [phone] and this number is disconnected.  The emergency contact number is the same as the home number.

## 2014-02-04 ENCOUNTER — Telehealth: Payer: Self-pay

## 2014-02-04 NOTE — Telephone Encounter (Signed)
Patient called and is requesting a refill on omeprazole 40mg . This is not in his current medication list but he states he has been on this for a while. Please advise. If so please send in to [facility] on [address]. Thanks!

## 2014-02-04 NOTE — Telephone Encounter (Signed)
Sent referral to gastroenterology, who left a message for patient pertaining to scheduling an appointment. Also, patient will need to report to office for labs. 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 I spoke with Ms. Foust, patient's fiance at length, he will need to schedule appt with GI.    Peter MaroonHollis,Peter Mitch M, FNP

## 2014-02-10 ENCOUNTER — Ambulatory Visit: Payer: PRIVATE HEALTH INSURANCE | Admitting: Family Medicine

## 2014-02-18 ENCOUNTER — Emergency Department (HOSPITAL_COMMUNITY)
Admission: EM | Admit: 2014-02-18 | Discharge: 2014-02-18 | Disposition: A | Discharge status: Home / Self Care | Payer: Medicaid Other | Attending: Emergency Medicine | Admitting: Emergency Medicine

## 2014-02-18 ENCOUNTER — Encounter (HOSPITAL_COMMUNITY): Payer: Self-pay | Admitting: *Deleted

## 2014-02-18 DIAGNOSIS — M545 Low back pain, unspecified: Secondary | ICD-10-CM

## 2014-02-18 DIAGNOSIS — Z79899 Other long term (current) drug therapy: Secondary | ICD-10-CM | POA: Insufficient documentation

## 2014-02-18 DIAGNOSIS — Z791 Long term (current) use of non-steroidal anti-inflammatories (NSAID): Secondary | ICD-10-CM | POA: Insufficient documentation

## 2014-02-18 DIAGNOSIS — Z720 Tobacco use: Secondary | ICD-10-CM | POA: Insufficient documentation

## 2014-02-18 MED ORDER — NAPROXEN 500 MG PO TABS: 500.0000 mg | ORAL_TABLET | Freq: Two times a day (BID) | ORAL | Status: DC

## 2014-02-18 MED ORDER — HYDROCODONE-ACETAMINOPHEN 5-325 MG PO TABS
1.0000 | ORAL_TABLET | Freq: Once | ORAL | Status: AC
  Administered 2014-02-18: 1 via ORAL
  Filled 2014-02-18: qty 1

## 2014-02-18 MED ORDER — CYCLOBENZAPRINE HCL 10 MG PO TABS
10.0000 mg | ORAL_TABLET | Freq: Once | ORAL | Status: AC
  Administered 2014-02-18: 10 mg via ORAL
  Filled 2014-02-18: qty 1

## 2014-02-18 MED ORDER — CYCLOBENZAPRINE HCL 10 MG PO TABS: 10.0000 mg | ORAL_TABLET | Freq: Two times a day (BID) | ORAL | Status: DC | PRN

## 2014-02-18 NOTE — Discharge Instructions (Signed)

## 2014-02-18 NOTE — ED Notes (Signed)
Pt here for back pain since yesterday, he pulled his back when he stumbled.  Pt has non radiating lower back pain.  No weakness, numbness or incontinence.  Pain non radiating and is in lower back

## 2014-02-18 NOTE — ED Provider Notes (Signed)
CSN: [id number]     Arrival date & time 02/18/14  1835 History  This chart was scribed for a non-[id number] practitioner, Everlene FarrierWilliam Ender Rorke, PA-C working with Linwood DibblesJon Knapp, MD by [place] Peace, ED Scribe. The patient was seen in WTR7/WTR7. The patient's care was started at 7:29 PM.    Chief Complaint  Patient presents with  . Back Pain      Patient is a 48 y.o. male presenting with back pain. The history is provided by the patient. No language interpreter was used.  Back Pain Associated symptoms: no abdominal pain, no chest pain, no dysuria, no fever, no headaches, no numbness and no weakness    HPI Comments: Peter Kline is a 48 y.o. male who presents to the Emergency Department complaining of non-radiating, intermittent, lower back pain onset yesterday. Pt states that he pulled his back due to stumbling and trying to prevent himself from falling. He rates pain as 4/10 and describes it as "sharp". Pain exacerbated with laying down. He denies any urinary problems or bowel incontinence. Pt furhter denies experiencing any numbness or muscle weakness. No complaints of fever, chills, nausea, abdominal pain, or chest pain. Pt notes he took aspirin to address pain without relief. Pt is current everyday smoker.  Patient denies bowel or bladder incontinence. He denies saddle anesthesia.   Past Medical History  Diagnosis Date  . Sickle cell anemia   . Shortness of breath    Past Surgical History  Procedure Laterality Date  . Laparoscopic gastrotomy w/ repair of ulcer     Family History  Problem Relation Age of Onset  . Diabetes Mother   . Hypertension Mother    History  Substance Use Topics  . Smoking status: Current Every Day Smoker -- 0.50 packs/day    Types: Cigarettes  . Smokeless tobacco: Never Used  . Alcohol Use: 3.6 oz/week    6 Cans of beer per week    Review of Systems  Constitutional: Negative for fever, chills and fatigue.  HENT: Negative for sore throat.   Eyes: Negative  for pain and visual disturbance.  Respiratory: Negative for cough, shortness of breath and wheezing.   Cardiovascular: Negative for chest pain, palpitations and leg swelling.  Gastrointestinal: Negative for nausea, vomiting, abdominal pain, diarrhea, constipation and blood in stool.  Genitourinary: Negative for dysuria, urgency, frequency, hematuria, decreased urine volume and difficulty urinating.  Musculoskeletal: Positive for back pain. Negative for neck pain and neck stiffness.  Skin: Negative for rash and wound.  Neurological: Negative for dizziness, syncope, weakness, light-headedness, numbness and headaches.      Allergies  Review of patient's allergies indicates no known allergies.  Home Medications   Prior to Admission medications   Medication Sig Start Date End Date Taking? Authorizing Provider  folic acid (FOLVITE) 1 MG tablet Take 1 tablet (1 mg total) by mouth daily. 02/01/14  Yes Massie MaroonLachina M Hollis, FNP  gabapentin (NEURONTIN) 300 MG capsule Take 1 capsule (300 mg total) by mouth 3 (three) times daily. 02/01/14  Yes Massie MaroonLachina M Hollis, FNP  GuaiFENesin (COUGH SYRUP PO) Take 10 mLs by mouth 2 (two) times daily as needed (cough).   Yes Historical Provider, MD  ibuprofen (ADVIL,MOTRIN) 800 MG tablet Take 1 tablet (800 mg total) by mouth daily as needed for mild pain or moderate pain (pain). 02/01/14  Yes Massie MaroonLachina M Hollis, FNP  oxyCODONE (OXY IR/ROXICODONE) 5 MG immediate release tablet Take 1 tablet (5 mg total) by mouth every 4 (four) hours as needed for  severe pain (pain). 02/01/14  Yes Massie Maroon, FNP  Phenyleph-Doxylamine-DM-APAP (ALKA SELTZER PLUS PO) Take 1 capsule by mouth daily as needed (cough & cold symptoms).    Yes Historical Provider, MD  cyclobenzaprine (FLEXERIL) 10 MG tablet Take 1 tablet (10 mg total) by mouth 2 (two) times daily as needed for muscle spasms. 02/18/14   Lawana Chambers, PA  naproxen (NAPROSYN) 500 MG tablet Take 1 tablet (500 mg total) by  mouth 2 (two) times daily with a meal. 02/18/14   Einar Gip Kjersten Ormiston, PA   BP 137/66 mmHg  Pulse 74  Temp(Src) 99.1 F (37.3 C) (Oral)  Resp 20  SpO2 97% Physical Exam  Constitutional: He is oriented to person, place, and time. He appears well-developed and well-nourished. No distress.  HENT:  Head: Normocephalic and atraumatic.  Mouth/Throat: Oropharynx is clear and moist. No oropharyngeal exudate.  Eyes: Conjunctivae and EOM are normal. Pupils are equal, round, and reactive to light. Right eye exhibits no discharge. Left eye exhibits no discharge.  Neck: Normal range of motion. Neck supple. No tracheal deviation present.  Cardiovascular: Normal rate, regular rhythm, normal heart sounds and intact distal pulses.  Exam reveals no gallop and no friction rub.   No murmur heard. Pulmonary/Chest: Effort normal and breath sounds normal. No respiratory distress. He has no wheezes. He has no rales.  Abdominal: Soft. There is no tenderness.  Musculoskeletal: Normal range of motion. He exhibits no edema.  Patient able to ambulate without difficulty or assistance. Strength 5 out of 5 in bilateral upper and lower extremities. Bilateral lumbar paraspinous muscles and spasm on exam. No spinous process point tenderness. Patellar DTRs intact bilaterally. Negative straight leg raise.  Lymphadenopathy:    He has no cervical adenopathy.  Neurological: He is alert and oriented to person, place, and time. He has normal reflexes. He displays normal reflexes. No cranial nerve deficit. Coordination normal.  Cranial nerves II through XII intact bilaterally. She able to ambulate without difficulty or assistance. Patient's strength is 5 out of 5 in his upper and lower extremities. Patellar DTRs are intact bilaterally.  Skin: Skin is warm and dry. No rash noted. He is not diaphoretic. No erythema.  Psychiatric: He has a normal mood and affect. His behavior is normal.  Nursing note and vitals reviewed.   ED  Course  Procedures (including critical care time) Labs Review Labs Reviewed - No data to display  Imaging Review No results found.   EKG Interpretation None      Filed Vitals:   02/18/14 1923  BP: 137/66  Pulse: 74  Temp: 99.1 F (37.3 C)  TempSrc: Oral  Resp: 20  SpO2: 97%    Medications  HYDROcodone-acetaminophen (NORCO/VICODIN) 5-325 MG per tablet 1 tablet (1 tablet Oral Given 02/18/14 1958)  cyclobenzaprine (FLEXERIL) tablet 10 mg (10 mg Oral Given 02/18/14 1958)    7:34 PM- Treatment plan was discussed with patient who verbalizes understanding and agrees.   MDM   Final diagnoses:  Bilateral low back pain without sciatica   Peter Kline is a 48 y.o. male he presents the ED with acute onset nonradiating lower back pain starting yesterday after pulling his back. He denies loss of bowel or bladder control or saddle anesthesia.  He denies numbness, tingling, weakness in his extremities. The patient is afebrile. He has no cranial nerve deficits and is able to ambulate without assistance or difficulty. Patient given a prescription for Flexeril and naproxen for pain control. Advised patient to  use caution when taking Flexeril as this can cause drowsiness Advised patient to follow-up with his primary care provider in the next 3 days. Advised to return to the ED with new or worsening symptoms or new concerns. Patient verbalized understanding and agreement with plan. Patient discussed with PA Kirichenko who agrees with assessment and plan.    I personally performed the services described in this documentation, which was scribed in my presence. The recorded information has been reviewed and is accurate.   Lawana ChambersWilliam Duncan Abdelrahman Nair, GeorgiaPA 02/18/14 2029  Linwood DibblesJon Knapp, MD 02/18/14 2031

## 2014-03-07 ENCOUNTER — Telehealth: Payer: Self-pay | Admitting: Internal Medicine

## 2014-03-07 DIAGNOSIS — D5720 Sickle-cell/Hb-C disease without crisis: Secondary | ICD-10-CM

## 2014-03-07 NOTE — Telephone Encounter (Signed)
Medication refill request for  oxyCODONE (OXY IR/ROXICODONE) 5 MG immediate release tablet        / LOV 01-24-2014

## 2014-03-09 MED ORDER — OXYCODONE HCL 5 MG PO TABS: 5.0000 mg | ORAL_TABLET | ORAL | Status: DC | PRN

## 2014-03-09 NOTE — Addendum Note (Signed)
Addended by: Marthann SchillerMATTHEWS, Zhyon Antenucci A on: 03/09/2014 12:27 PM   Modules accepted: Orders

## 2014-03-09 NOTE — Telephone Encounter (Signed)
Prescription for Oxycodone 5 mg #60 re-written

## 2014-04-04 ENCOUNTER — Telehealth: Payer: Self-pay | Admitting: Internal Medicine

## 2014-04-04 DIAGNOSIS — D5720 Sickle-cell/Hb-C disease without crisis: Secondary | ICD-10-CM

## 2014-04-04 MED ORDER — OXYCODONE HCL 5 MG PO TABS: 5.0000 mg | ORAL_TABLET | ORAL | Status: DC | PRN

## 2014-04-04 NOTE — Telephone Encounter (Signed)
Meds ordered this encounter  Medications  . oxyCODONE (OXY IR/ROXICODONE) 5 MG immediate release tablet    Sig: Take 1 tablet (5 mg total) by mouth every 4 (four) hours as needed for severe pain (pain).    Dispense:  60 tablet    Refill:  0  Reviewed [place] Substance Reporting system prior to reorder  Massie MaroonHollis,Alric Geise M, FNP

## 2014-04-04 NOTE — Telephone Encounter (Signed)
Refill request  For oxycodone 5mg . LOV 01/24/2014. Please advise. Thanks!

## 2014-04-06 ENCOUNTER — Other Ambulatory Visit: Payer: Self-pay | Admitting: Family Medicine

## 2014-05-02 ENCOUNTER — Ambulatory Visit (INDEPENDENT_AMBULATORY_CARE_PROVIDER_SITE_OTHER): Payer: Medicaid Other | Admitting: Family Medicine

## 2014-05-02 ENCOUNTER — Other Ambulatory Visit: Payer: Self-pay | Admitting: Family Medicine

## 2014-05-02 VITALS — BP 103/57 | HR 77 | Temp 98.3°F | Resp 14 | Ht 72.0 in | Wt 179.0 lb

## 2014-05-02 DIAGNOSIS — Z23 Encounter for immunization: Secondary | ICD-10-CM

## 2014-05-02 DIAGNOSIS — R894 Abnormal immunological findings in specimens from other organs, systems and tissues: Secondary | ICD-10-CM

## 2014-05-02 DIAGNOSIS — D5720 Sickle-cell/Hb-C disease without crisis: Secondary | ICD-10-CM | POA: Diagnosis not present

## 2014-05-02 DIAGNOSIS — F17200 Nicotine dependence, unspecified, uncomplicated: Secondary | ICD-10-CM

## 2014-05-02 DIAGNOSIS — R768 Other specified abnormal immunological findings in serum: Secondary | ICD-10-CM

## 2014-05-02 LAB — COMPLETE METABOLIC PANEL WITH GFR
ALT: 18 U/L (ref 0–53)
AST: 21 U/L (ref 0–37)
Albumin: 4 g/dL (ref 3.5–5.2)
Alkaline Phosphatase: 86 U/L (ref 39–117)
BILIRUBIN TOTAL: 0.8 mg/dL (ref 0.2–1.2)
BUN: 10 mg/dL (ref 6–23)
CO2: 25 mEq/L (ref 19–32)
CREATININE: 0.92 mg/dL (ref 0.50–1.35)
Calcium: 9.5 mg/dL (ref 8.4–10.5)
Chloride: 105 mEq/L (ref 96–112)
GFR, Est Non African American: >89 mL/min
GLUCOSE: 88 mg/dL (ref 70–99)
POTASSIUM: 4.2 meq/L (ref 3.5–5.3)
Sodium: 139 mEq/L (ref 135–145)
TOTAL PROTEIN: 7.5 g/dL (ref 6.0–8.3)

## 2014-05-02 MED ORDER — OXYCODONE HCL 5 MG PO TABS: 5.0000 mg | ORAL_TABLET | ORAL | Status: DC | PRN

## 2014-05-02 NOTE — Progress Notes (Signed)
Subjective:    Patient ID: Peter Kline, male    DOB: 1965/06/30, [age] y.o.   MRN: [id number]  HPI   Patient presents for follow-up for a history of sickle cell anemia, HbSC presents for 3 month follow-up.  Patient states that he is currently having pain to bilateral lower extremity pain. Current pain intensity is 6-7/10 described as constant aching. Patient reports that he is taking medications consistently, but is currently out of pain medications. Report that he has not attempted any OTC interventions to alleviate symptoms.   Patient was recently tested for Hepatitis C. Hep C antibody test was positive. Requested that patient return for follow up testing. He states that he missed his lab appointment due to having to work extensive hours. Patient denies fever, night sweats, fatigue headache, dizziness, shortness of breath.   Past Medical History  Diagnosis Date  . Sickle cell anemia   . Shortness of breath     Review of Systems  Constitutional: Unexpected weight change: weight loss.  Eyes: Negative.   Respiratory: Negative.  Negative for shortness of breath.   Cardiovascular: Negative.   Endocrine: Negative.   Genitourinary: Negative.   Musculoskeletal: Positive for myalgias (pain to lower extremities).  Skin: Negative.   Allergic/Immunologic: Negative.   Neurological: Negative.   Hematological: Negative.   Psychiatric/Behavioral: Negative.        Objective:   Physical Exam  Constitutional: Vital signs are normal. He appears well-developed and well-nourished.  HENT:  Head: Normocephalic and atraumatic.  Right Ear: Hearing, tympanic membrane, external ear and ear canal normal. No drainage. No decreased hearing is noted.  Left Ear: Hearing, tympanic membrane, external ear and ear canal normal. No drainage. No decreased hearing is noted.  Nose: Mucosal edema and rhinorrhea present.  Eyes: Conjunctivae, EOM and lids are normal. Pupils are equal, round, and reactive to light.  Lids are everted and swept, no foreign bodies found. No scleral icterus.  Neck: Trachea normal and normal range of motion. Neck supple.  Cardiovascular: Normal rate, regular rhythm, normal heart sounds, intact distal pulses and normal pulses.   No murmur heard. Pulmonary/Chest: Effort normal and breath sounds normal. No respiratory distress. He has no wheezes. He has no rales.  Abdominal: Soft. Normal appearance. There is no tenderness. There is no rigidity, no guarding, no tenderness at McBurney's point and negative Murphy's sign.  Skin: Skin is warm and dry.  Psychiatric: He has a normal mood and affect. His behavior is normal. Judgment and thought content normal.      BP 103/57 mmHg  Pulse 77  Temp(Src) 98.3 F (36.8 C) (Oral)  Resp 14  Ht 6' (1.829 m)  Wt 179 lb (81.194 kg)  BMI 24.27 kg/m2  SpO2 99%    Assessment & Plan:   1. Sickle cell disease, type [place], without crisis Patient states that he is having increased joint pain today. Recommend that patient increase hydration and take Oxycodone 5 mg as previously prescribed.   - CBC with Differential - COMPLETE METABOLIC PANEL WITH GFR - Vitamin D, 25-hydroxy - oxyCODONE (OXY IR/ROXICODONE) 5 MG immediate release tablet; Take 1 tablet (5 mg total) by mouth every 4 (four) hours as needed for severe pain (pain).  Dispense: 60 tablet; Refill: 0 Reviewed [place] Substance Reporting system prior to reorder  Sickle cell disease - Continue folic acid 1 mg daily to prevent aplastic bone marrow crises.   Pulmonary evaluation - Patient denies severe recurrent wheezes, shortness of breath with exercise, or persistent  cough. If these symptoms develop, pulmonary function tests with spirometry will be ordered, and if abnormal, plan on referral to Pulmonology for further evaluation.  Cardiac - Routine screening for pulmonary hypertension is not recommended.   Eye - High risk of proliferative retinopathy. Annual eye exam with retinal exam  recommended to patient. Mr. Moes was previously referred for eye examination. He states that he was not notified of appointment. I will send a referral.    Immunization status - Yearly influenza vaccination is recommended, as well as being up to date with Meningococcal and Pneumococcal vaccines.  I will give Prevnar today.   Acute and chronic painful episodes -  We discussed that he is to receive his Schedule II prescriptions only from [place]. He is also aware that his  prescription history is available to [place] online through the [facility] CSRS. We reviewed the terms of our pain agreement, including the need to keep medicines in a safe locked location away from children or pets, and the need to report excess sedation or constipation, measures to avoid constipation, and policies related to early refills and stolen prescriptions. According to the [place] Chronic Pain Initiative program, we have reviewed details related to analgesia, adverse effects, aberrant behaviors.   Iron overload from chronic transfusion. Last Ferritin level was 148.    Vitamin D deficiency -I will check Vitamin D level today  The above recommendations are taken from the NIH Evidence-Based Management of Sickle Cell Disease: Expert Panel Report, 16109.   Chronic medical problems including diabetes, hypertension and COPD. We recommended she try to find a PCP in [place].  2. HCV antibody positive Patient did not return for testing. Will collect confirmation test. I will review results as they become available.  - HCV RNA NAA Qualitative Patient denies a past history of IV Drug use, but does report a long history of unprotected sexual encounters. Positive HCV antibodies, will send confirmation test HCV qualitative by PCR.   3. Tobacco dependence He states that he smokes around 1 cigarettes per day. Patient started smoking again 6 months ago after quitting for 4 years. He states that he will attempt to stop smoking again. Discussed the cigarette  smoking at it relates to sickle cell disease at length.   4. Immunization due  - Pneumococcal conjugate vaccine 13-valent   RTC: CPE 3 months   Louretta Tantillo M, FNP

## 2014-05-03 ENCOUNTER — Telehealth: Payer: Self-pay | Admitting: Family Medicine

## 2014-05-03 ENCOUNTER — Encounter: Payer: Self-pay | Admitting: Family Medicine

## 2014-05-03 DIAGNOSIS — E559 Vitamin D deficiency, unspecified: Secondary | ICD-10-CM

## 2014-05-03 HISTORY — DX: Vitamin D deficiency, unspecified: E55.9

## 2014-05-03 LAB — CBC WITH DIFFERENTIAL/PLATELET
Basophils Absolute: 0.1 10*3/uL (ref 0.0–0.1)
Basophils Relative: 1 % (ref 0–1)
Eosinophils Absolute: 0.4 10*3/uL (ref 0.0–0.7)
Eosinophils Relative: 3 % (ref 0–5)
HCT: 39.8 % (ref 39.0–52.0)
HEMOGLOBIN: 13.3 g/dL (ref 13.0–17.0)
LYMPHS PCT: 21 % (ref 12–46)
Lymphs Abs: 2.7 10*3/uL (ref 0.7–4.0)
MCH: 29 pg (ref 26.0–34.0)
MCHC: 33.4 g/dL (ref 30.0–36.0)
MCV: 86.9 fL (ref 78.0–100.0)
MONO ABS: 1.3 10*3/uL — AB (ref 0.1–1.0)
MONOS PCT: 10 % (ref 3–12)
MPV: 11.8 fL (ref 8.6–12.4)
NEUTROS ABS: 8.3 10*3/uL — AB (ref 1.7–7.7)
Neutrophils Relative %: 65 % (ref 43–77)
Platelets: 221 10*3/uL (ref 150–400)
RBC: 4.58 MIL/uL (ref 4.22–5.81)
RDW: 16.2 % — ABNORMAL HIGH (ref 11.5–15.5)
WBC: 12.8 10*3/uL — ABNORMAL HIGH (ref 4.0–10.5)

## 2014-05-03 LAB — VITAMIN D 25 HYDROXY (VIT D DEFICIENCY, FRACTURES): Vit D, 25-Hydroxy: 18 ng/mL — ABNORMAL LOW (ref 30–100)

## 2014-05-03 MED ORDER — ERGOCALCIFEROL 1.25 MG (50000 UT) PO CAPS: 50000.0000 [IU] | ORAL_CAPSULE | ORAL | Status: DC

## 2014-05-03 NOTE — Telephone Encounter (Signed)
Meds ordered this encounter  Medications  . ergocalciferol (VITAMIN D2) 50000 UNITS capsule    Sig: Take 1 capsule (50,000 Units total) by mouth once a week.    Dispense:  4 capsule    Refill:  2    Order Specific Question:  Supervising Provider    Answer:  MATTHEWS, MICHELLE A [[id number]]

## 2014-05-04 LAB — HEPATITIS C VRS RNA DETECT BY PCR-QUAL: HEPATITIS C VRS RNA BY PCR-QUAL: NEGATIVE

## 2014-05-30 ENCOUNTER — Telehealth: Payer: Self-pay | Admitting: Internal Medicine

## 2014-05-30 DIAGNOSIS — D5720 Sickle-cell/Hb-C disease without crisis: Secondary | ICD-10-CM

## 2014-05-30 MED ORDER — NAPROXEN 500 MG PO TABS: ORAL_TABLET | ORAL | Status: DC

## 2014-05-30 MED ORDER — GABAPENTIN 300 MG PO CAPS: 300.0000 mg | ORAL_CAPSULE | Freq: Three times a day (TID) | ORAL | Status: DC

## 2014-05-30 MED ORDER — FOLIC ACID 1 MG PO TABS: 1.0000 mg | ORAL_TABLET | Freq: Every day | ORAL | Status: DC

## 2014-05-30 NOTE — Telephone Encounter (Signed)
I will not be able to fill [name] prescription at this point. According to the [place] Substance Reporting System, [name] received a 30 day prescription from another provider on 05/17/2014. He can schedule an appointment to discuss medication. Left message on voicemail.    Massie MaroonHollis,Vinton Layson M, FNP

## 2014-05-30 NOTE — Telephone Encounter (Signed)
Refill request for oxycodone 5mg . LOV 05/02/2014. Please advise. All others done and sent to pharmacy.  Thanks!

## 2014-05-31 NOTE — Telephone Encounter (Signed)
Called and left message advising patient that he would not be getting a refill due to getting his medication filled by another provider and that he could schedule an appointment if he would like to discuss this with the provider. Thanks!

## 2014-06-29 ENCOUNTER — Telehealth: Payer: Self-pay | Admitting: Internal Medicine

## 2014-06-29 NOTE — Telephone Encounter (Signed)
Received fax from [facility] requesting medical records. Request forwarded to [employer].

## 2014-08-01 ENCOUNTER — Other Ambulatory Visit: Payer: PRIVATE HEALTH INSURANCE

## 2014-08-02 ENCOUNTER — Other Ambulatory Visit: Payer: PRIVATE HEALTH INSURANCE

## 2014-08-09 ENCOUNTER — Ambulatory Visit: Payer: PRIVATE HEALTH INSURANCE | Admitting: Family Medicine

## 2014-09-16 ENCOUNTER — Ambulatory Visit: Payer: PRIVATE HEALTH INSURANCE | Admitting: Family Medicine

## 2014-09-26 ENCOUNTER — Telehealth: Payer: Self-pay | Admitting: Family Medicine

## 2014-09-26 NOTE — Telephone Encounter (Signed)
Mr. Mats Heaster called requesting an appointment. Patient is currently seeing another primary provider that is prescribing opiate medications. Mr. Bostic was informed that he could not have 2 primary providers. Discussed with Mr. Guckert that he can schedule an appointment to discuss, but we will not be able to prescribe opiate medications during that appointment.   Massie Maroon, FNP

## 2015-02-15 ENCOUNTER — Other Ambulatory Visit: Payer: Self-pay | Admitting: Family Medicine

## 2015-05-11 ENCOUNTER — Telehealth: Payer: Self-pay | Admitting: Family Medicine

## 2015-05-11 NOTE — Telephone Encounter (Signed)
Received request from [employer] for Medical Records. Forwarded to [employer].  

## 2015-05-17 ENCOUNTER — Other Ambulatory Visit: Payer: Self-pay | Admitting: Family Medicine

## 2015-06-15 ENCOUNTER — Other Ambulatory Visit: Payer: Self-pay | Admitting: Family Medicine

## 2015-07-17 ENCOUNTER — Other Ambulatory Visit: Payer: Self-pay | Admitting: Family Medicine

## 2015-07-19 ENCOUNTER — Other Ambulatory Visit: Payer: Self-pay | Admitting: Family Medicine

## 2017-06-18 ENCOUNTER — Ambulatory Visit (INDEPENDENT_AMBULATORY_CARE_PROVIDER_SITE_OTHER): Payer: Self-pay | Admitting: Family Medicine

## 2017-06-18 ENCOUNTER — Encounter: Payer: Self-pay | Admitting: Family Medicine

## 2017-06-18 ENCOUNTER — Ambulatory Visit: Payer: PRIVATE HEALTH INSURANCE | Admitting: Family Medicine

## 2017-06-18 VITALS — BP 121/73 | HR 70 | Temp 98.4°F | Resp 16 | Ht 72.0 in | Wt 201.0 lb

## 2017-06-18 DIAGNOSIS — K219 Gastro-esophageal reflux disease without esophagitis: Secondary | ICD-10-CM

## 2017-06-18 DIAGNOSIS — L309 Dermatitis, unspecified: Secondary | ICD-10-CM

## 2017-06-18 DIAGNOSIS — Z79891 Long term (current) use of opiate analgesic: Secondary | ICD-10-CM

## 2017-06-18 DIAGNOSIS — D5720 Sickle-cell/Hb-C disease without crisis: Secondary | ICD-10-CM

## 2017-06-18 LAB — POCT URINALYSIS DIP (DEVICE)
BILIRUBIN URINE: NEGATIVE
Glucose, UA: NEGATIVE mg/dL
HGB URINE DIPSTICK: NEGATIVE
KETONES UR: NEGATIVE mg/dL
LEUKOCYTES UA: NEGATIVE
NITRITE: NEGATIVE
PH: 6 (ref 5.0–8.0)
Protein, ur: NEGATIVE mg/dL
Specific Gravity, Urine: 1.015 (ref 1.005–1.030)
Urobilinogen, UA: 0.2 mg/dL (ref 0.0–1.0)

## 2017-06-18 MED ORDER — OMEPRAZOLE 40 MG PO CPDR: 40.0000 mg | DELAYED_RELEASE_CAPSULE | Freq: Every day | ORAL | 1 refills | Status: DC

## 2017-06-18 MED ORDER — GABAPENTIN 300 MG PO CAPS: 300.0000 mg | ORAL_CAPSULE | Freq: Three times a day (TID) | ORAL | 1 refills | Status: DC

## 2017-06-18 MED ORDER — TRIAMCINOLONE ACETONIDE 0.5 % EX OINT: 1.0000 "application " | TOPICAL_OINTMENT | Freq: Two times a day (BID) | CUTANEOUS | 2 refills | Status: AC

## 2017-06-18 MED ORDER — FOLIC ACID 1 MG PO TABS: 1.0000 mg | ORAL_TABLET | Freq: Every day | ORAL | 11 refills | Status: DC

## 2017-06-18 NOTE — Patient Instructions (Addendum)
We will follow-up by phone with any abnormal laboratory results.  Your medications have been sent to your pharmacy electronically.  Discuss restarting opiate medications at length.  I will follow-up with you by phone after reviewing drug screen.  You may warrant referral to pain management for further workup and evaluation of chronic pain.   You welcome to use the day infusion center acute pain crises.  We accept patients for admission Monday through Friday from 8 AM to 12.  We did not except patient's afternoon due to potential treatment failure.  For dermatitis we will start a trial of triamcinolone ointment twice daily for 10 days. Also recommend Dove unscented body wash. Recommend washing clothing and hypoallergenic laundry detergent.  Recommend applying Vaseline to skin daily after shower to preserve moisture.

## 2017-06-18 NOTE — Progress Notes (Signed)
Subjective:    Patient ID: Peter Kline, male    DOB: Jul 28, 1965, 52 y.o.   MRN: [id number]  HPI 52 year old male with a history of sickle cell anemia, HbSC presents to establish care. Mr. Ayala was a patient of Dr. Billee Cashing prior to establishing care. He says that he was last evaluated in office over 1 month ago. He is complaining of chronic pain related to sickle cell anemia. Pain is typically in knees and groin. Current pain intensity is 4/10 characterized as intermittent and aching. Patient takes gabapentin 300 mg TID and Oxycodone 10 mg 4 times per day. He is requesting refills on both medications. He typically takes folic acid 1 mg daily and hydrates consistently. Patient says that he has not been hospitalized for a sickle cell crisis in greater than 1 year. He smokes marijuana periodically. Initially, patient stated that it had been a month since last used. However, he changed last marijuana use to 1 week ago. He is also a chronic everyday tobacco user. He typically smokes 1 pack every 2 days.    Mr. Verastegui is also complaining of a rash primarily to arms and abdomen for greater than 1 month. He has been applying OTC lotions without relief. He describes skin as dry and itching. He denies having contacts with similar rash.  Patient has not identified precipitant. Patient has not had new exposures (soaps, lotions, laundry detergents, foods, medications, plants, insects or animals.).  Past Medical History:  Diagnosis Date  . Shortness of breath   . Sickle cell anemia (HCC)    Review of Systems  Constitutional: Negative for fatigue and fever.  HENT: Negative.   Respiratory: Negative for cough and shortness of breath.   Cardiovascular: Negative.   Gastrointestinal: Negative.   Endocrine: Negative.   Genitourinary: Negative.   Musculoskeletal: Negative.   Skin: Positive for rash (itching).  Hematological: Negative.   Psychiatric/Behavioral: Negative.        Objective:   Physical Exam  Skin: Rash noted. Rash is macular.     Flat, hyperpigmented macular rash to extensor surfaces, and abdomen.          BP 121/73 (BP Location: Left Arm, Patient Position: Sitting, Cuff Size: Large)   Pulse 70   Temp 98.4 F (36.9 C) (Oral)   Resp 16   Ht 6' (1.829 m)   Wt 201 lb (91.2 kg)   SpO2 98%   BMI 27.26 kg/m  Assessment & Plan:   Sickle cell-hemoglobin C disease without crisis (HCC)  Continue folic acid 1 mg daily to prevent aplastic bone marrow crises.   Pulmonary evaluation - Patient denies severe recurrent wheezes, shortness of breath with exercise, or persistent cough. If these symptoms develop, pulmonary function tests with spirometry will be ordered, and if abnormal, plan on referral to Pulmonology for further evaluation.  . Cardiac - Routine screening for pulmonary hypertension is not recommended.  Eye - High risk of proliferative retinopathy. Annual eye exam with retinal exam recommended to patient.   Acute and chronic painful episodes - Agreed on Oxycodone 10 mg after reviewing urine drug screen. Patient admits to periodic marijuana use.  We discussed that pt is to receive his Schedule II prescriptions only from [place]. Pt is also aware that the prescription history is available to [place] online through PMP aware. Controlled substance agreement signed 06/18/2017. We reminded [place] that all patients receiving Schedule II narcotics must be seen for follow within one month of prescription being requested. We  reviewed the terms of our pain agreement, including the need to keep medicines in a safe locked location away from children or pets, and the need to report excess sedation or constipation, measures to avoid constipation, and policies related to early refills and stolen prescriptions. According to the [place] Chronic Pain Initiative program, we have reviewed details related to analgesia, adverse effects, aberrant behaviors.  MME 60/day. Patient was receiving 120  Oxycodone per month prior to establishing care. His last Rx was filled on 05/23/2017  - Oxycodone HCl 10 MG TABS; Take 10 mg by mouth 4 (four) times daily. - CBC with Differential - Reticulocytes - Vitamin D, 25-hydroxy - Ferritin - Comprehensive metabolic panel - gabapentin (NEURONTIN) 300 MG capsule; Take 1 capsule (300 mg total) by mouth 3 (three) times daily.  Dispense: 90 capsule; Refill: 1 - folic acid (FOLVITE) 1 MG tablet; Take 1 tablet (1 mg total) by mouth daily.  Dispense: 30 tablet; Refill: 11 - EKG 12-Lead - POCT urinalysis dip (device)  Chronic prescription opiate use  - ToxASSURE Select 13 (MW), Urine  Dermatitis  - triamcinolone ointment (KENALOG) 0.5 %; Apply 1 application topically 2 (two) times daily for 10 days.  Dispense: 30 g; Refill: 2   Gastroesophageal reflux disease without esophagitis  - omeprazole (PRILOSEC) 40 MG capsule; Take 1 capsule (40 mg total) by mouth daily.  Dispense: 30 capsule; Refill: 1   RTC: 3 months for sickle cell anemia and medication management   The patient was given clear instructions to go to ER or return to medical center if symptoms do not improve, worsen or new problems develop. The patient verbalized understanding.    Nolon NationsLachina Moore Ledell Codrington  MSN, FNP-C Patient Care [facility] [address]  , [place] [id number] [phone]

## 2017-06-19 ENCOUNTER — Other Ambulatory Visit: Payer: Self-pay | Admitting: Family Medicine

## 2017-06-19 ENCOUNTER — Telehealth: Payer: Self-pay

## 2017-06-19 DIAGNOSIS — E559 Vitamin D deficiency, unspecified: Secondary | ICD-10-CM

## 2017-06-19 LAB — CBC WITH DIFFERENTIAL/PLATELET
Basophils Absolute: 0.1 10*3/uL (ref 0.0–0.2)
Basos: 1 %
EOS (ABSOLUTE): 0.3 10*3/uL (ref 0.0–0.4)
Eos: 3 %
Hematocrit: 40.2 % (ref 37.5–51.0)
Hemoglobin: 14 g/dL (ref 13.0–17.7)
Immature Grans (Abs): 0 10*3/uL (ref 0.0–0.1)
Immature Granulocytes: 0 %
LYMPHS ABS: 2.5 10*3/uL (ref 0.7–3.1)
Lymphs: 24 %
MCH: 30.1 pg (ref 26.6–33.0)
MCHC: 34.8 g/dL (ref 31.5–35.7)
MCV: 87 fL (ref 79–97)
MONOS ABS: 1.1 10*3/uL — AB (ref 0.1–0.9)
Monocytes: 11 %
Neutrophils Absolute: 6.4 10*3/uL (ref 1.4–7.0)
Neutrophils: 61 %
PLATELETS: 201 10*3/uL (ref 150–379)
RBC: 4.65 x10E6/uL (ref 4.14–5.80)
RDW: 16.6 % — ABNORMAL HIGH (ref 12.3–15.4)
WBC: 10.3 10*3/uL (ref 3.4–10.8)

## 2017-06-19 LAB — COMPREHENSIVE METABOLIC PANEL
ALBUMIN: 4.4 g/dL (ref 3.5–5.5)
ALT: 21 IU/L (ref 0–44)
AST: 26 IU/L (ref 0–40)
Albumin/Globulin Ratio: 1.4 (ref 1.2–2.2)
Alkaline Phosphatase: 100 IU/L (ref 39–117)
BILIRUBIN TOTAL: 0.9 mg/dL (ref 0.0–1.2)
BUN / CREAT RATIO: 11 (ref 9–20)
BUN: 9 mg/dL (ref 6–24)
CHLORIDE: 103 mmol/L (ref 96–106)
CO2: 23 mmol/L (ref 20–29)
CREATININE: 0.85 mg/dL (ref 0.76–1.27)
Calcium: 9.3 mg/dL (ref 8.7–10.2)
GFR calc Af Amer: 116 mL/min/{1.73_m2} (ref >59)
GFR calc non Af Amer: 100 mL/min/{1.73_m2} (ref >59)
GLUCOSE: 94 mg/dL (ref 65–99)
Globulin, Total: 3.1 g/dL (ref 1.5–4.5)
Potassium: 4.3 mmol/L (ref 3.5–5.2)
Sodium: 140 mmol/L (ref 134–144)
Total Protein: 7.5 g/dL (ref 6.0–8.5)

## 2017-06-19 LAB — FERRITIN: Ferritin: 322 ng/mL (ref 30–400)

## 2017-06-19 LAB — VITAMIN D 25 HYDROXY (VIT D DEFICIENCY, FRACTURES): Vit D, 25-Hydroxy: 12.4 ng/mL — ABNORMAL LOW (ref 30.0–100.0)

## 2017-06-19 LAB — RETICULOCYTES: Retic Ct Pct: 4.1 % — ABNORMAL HIGH (ref 0.6–2.6)

## 2017-06-19 MED ORDER — ERGOCALCIFEROL 1.25 MG (50000 UT) PO CAPS: 50000.0000 [IU] | ORAL_CAPSULE | ORAL | 1 refills | Status: DC

## 2017-06-19 NOTE — Progress Notes (Signed)
Meds ordered this encounter  Medications  . ergocalciferol (VITAMIN D2) 50000 units capsule    Sig: Take 1 capsule (50,000 Units total) by mouth once a week.    Dispense:  30 capsule    Refill:  1    Lamoyne Palencia Moore Future Yeldell  MSN, FNP-C [facility] [address]  [place], [place] [address] [phone]  

## 2017-06-19 NOTE — Telephone Encounter (Signed)
-----   Message from Massie MaroonLachina M Hollis, [place]FNP sent at 06/19/2017  7:27 AM EST ----- Regarding: Lab results Please inform patient that vitamin D level is decreased which is consistent with a vitamin D deficiency.  Started Drisdol 50,000 IUs weekly.  Will recheck vitamin D level in around 12 weeks. Thanks

## 2017-06-19 NOTE — Telephone Encounter (Signed)
Patient is asking for a refill on oxycodone.   Also is asking for a referral for [facility] for Hep C.   Please advise. Thanks!

## 2017-06-19 NOTE — Telephone Encounter (Signed)
Called and spoke with patient, advised that vitamin D is low and he needs to start once weekly vitamin D supplement. Advised that we will recheck in 12 weeks and appointment was scheduled for 09/12/17@2 :00pm. Thanks!

## 2017-06-24 ENCOUNTER — Telehealth: Payer: Self-pay

## 2017-06-24 LAB — TOXASSURE SELECT 13 (MW), URINE

## 2017-06-24 NOTE — Telephone Encounter (Signed)
Peter Kline,  Did you receive the request for patient's oxycodone?

## 2017-07-28 NOTE — Progress Notes (Signed)
[facility] - Clinic Note  07/29/2017     CHIEF COMPLAINT Patient presents for Retina Evaluation   HISTORY OF PRESENT ILLNESS: Peter Kline is a 52 y.o. male who presents to the clinic today for:   HPI    Retina Evaluation    In both eyes.  This started 1 year ago.  Associated Symptoms Photophobia.  Negative for Blind Spot, Glare, Shoulder/Hip pain, Distortion, Jaw Claudication, Fatigue, Weight Loss, Scalp Tenderness, Redness, Floaters, Flashes, Pain, Trauma and Fever.  Context:  distance vision, mid-range vision and near vision.  Treatments tried include no treatments.  I, the attending physician,  performed the HPI with the patient and updated documentation appropriately.          Comments    Referral of Dr. Krista Blueemarco for Sickle Cell Retinopexy. Patient states" he noticed appx a year or so ago,  his eyes are sensitive to light sometimes".Denies wavy vision and ocular pain. Denies vit's/gtt's       Last edited by Rennis ChrisZamora, Thornton Dohrmann, MD on 07/29/2017  2:24 PM. (History)    Pt states he has sickle cell and has had issues with sickle cell;   Referring physician: Mateo FlowHecker, Kathryn, MD [address] [place], [place] [id number]  HISTORICAL INFORMATION:   Selected notes from the MEDICAL RECORD NUMBER Referred by Dr. Kirtland BouchardK. 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 Hecker) [BCVA OD: 20/20 OS: 20/20-] Ocular Hx- glaucoma suspect, cataract OU PMH- sickle cell, current smoker    CURRENT MEDICATIONS: Current Outpatient Medications (Ophthalmic Drugs)  Medication Sig  . prednisoLONE acetate (PRED FORTE) 1 % ophthalmic suspension Place 1 drop into the left eye 4 (four) times daily for 7 days.   No current facility-administered medications for this visit.  (Ophthalmic Drugs)   Current Outpatient Medications (Other)  Medication Sig  . ergocalciferol (VITAMIN D2) 50000 units capsule Take 1 capsule (50,000 Units total) by mouth once a week.  .  folic acid (FOLVITE) 1 MG tablet Take 1 tablet (1 mg total) by mouth daily.  Marland Kitchen. gabapentin (NEURONTIN) 300 MG capsule Take 1 capsule (300 mg total) by mouth 3 (three) times daily.  Marland Kitchen. omeprazole (PRILOSEC) 40 MG capsule Take 1 capsule (40 mg total) by mouth daily.  . Oxycodone HCl 10 MG TABS Take 10 mg by mouth 4 (four) times daily.   No current facility-administered medications for this visit.  (Other)      REVIEW OF SYSTEMS: ROS    Positive for: Eyes, Heme/Lymph   Negative for: Constitutional, Gastrointestinal, Neurological, Skin, Genitourinary, Musculoskeletal, HENT, Endocrine, Cardiovascular, Respiratory, Psychiatric, Allergic/Imm   Last edited by Eldridge ScotKendrick, Glenda, LPN on 1/91/47824/16/2019  1:43 PM. (History)       ALLERGIES No Known Allergies  PAST MEDICAL HISTORY Past Medical History:  Diagnosis Date  . Shortness of breath   . Sickle cell anemia (HCC)    Past Surgical History:  Procedure Laterality Date  . LAPAROSCOPIC GASTROTOMY W/ REPAIR OF ULCER      FAMILY HISTORY Family History  Problem Relation Age of Onset  . Diabetes Mother   . Hypertension Mother     SOCIAL HISTORY Social History   Tobacco Use  . Smoking status: Current Every Day Smoker    Packs/day: 0.50    Types: Cigarettes  . Smokeless tobacco: Never Used  Substance Use Topics  . Alcohol use: Yes    Alcohol/week: 3.6 oz    Types: 6 Cans of beer per week  Comment: occ  . Drug use: Yes    Types: Marijuana    Comment: occ         OPHTHALMIC EXAM:  Base Eye Exam    Visual Acuity (Snellen - Linear)      Right Left   Dist [place] 20/60 -2 20/70 -1   Dist ph [place] 20/30 -1 20/30       Tonometry (Tonopen, 2:00 PM)      Right Left   Pressure 22 22       Pupils      Dark Light Shape React APD   Right 4 3 Round Minimal None   Left 4 3 Round Minimal None       Visual Fields (Counting fingers)      Left Right    Full Full       Extraocular Movement      Right Left    Full, Ortho Full, Ortho        Neuro/Psych    Oriented x3:  Yes   Mood/Affect:  Normal       Dilation    Both eyes:  1.0% Mydriacyl, 2.5% Phenylephrine @ 2:00 PM        Slit Lamp and Fundus Exam    Slit Lamp Exam      Right Left   Lids/Lashes Dermatochalasis - upper lid Dermatochalasis - upper lid   Conjunctiva/Sclera Melanosis Melanosis   Cornea Mild Arcus, trace Punctate epithelial erosions Mild Arcus, trace Punctate epithelial erosions   Anterior Chamber Deep and quiet Deep and quiet   Iris Round and dilated Round and dilated   Lens 1+ Nuclear sclerosis, 2+ Cortical cataract 1+ Nuclear sclerosis, 2+ Cortical cataract   Vitreous Mild Vitreous syneresis Mild Vitreous syneresis, vitreous debris IT       Fundus Exam      Right Left   Disc deep cup cupped   C/D Ratio 0.8 0.8   Macula Good foveal reflex, mild Retinal pigment epithelial mottling, No heme or edema, temporal atrophy Flat, Retinal pigment epithelial mottling, temporal atrophy   Vessels Vascular attenuation; sclerotic vessels temporal periphery Vascular attenuation; sclerotic vessels temporal periphery   Periphery Attached, pigmented Chorioretinal scar at 1200, pigmented chorioretinal scars inferiorly, vitreous condensations at 0730 overlying chorioretinal scarring, pigmented scarring with overlying fibrosis at 0900, sunburst lesion at 1030 mid-zone Attached, pigmented Chorioretinal scar temporally, fibro-vascular tissue at 0500 with hemorrhage, looks like c-fan, sunburst lesion at 0730        Refraction    Manifest Refraction (Auto)      Sphere Cylinder Axis Dist VA   Right -2.00 +0.75 101    Left -1.75 +0.75 089        Manifest Refraction #2      Sphere Cylinder Axis Dist VA   Right -2.00 +0.75 101 20/20   Left -1.75 +1.25 089 20/25          IMAGING AND PROCEDURES  Imaging and Procedures for 07/29/17  OCT, Retina - OU - Both Eyes       Right Eye Quality was good. Central Foveal Thickness: 264. Progression has no prior  data. Findings include normal foveal contour, no IRF, no SRF, inner retinal atrophy, outer retinal atrophy, epiretinal membrane.   Left Eye Quality was good. Central Foveal Thickness: 274. Progression has no prior data. Findings include normal foveal contour, no IRF, no SRF, inner retinal atrophy, outer retinal atrophy, epiretinal membrane.   Notes *Images captured and stored on drive  Diagnosis / Impression:  NFP; no IRF/SRF OU Temporal atrophy OU Mild ERM OU  Clinical management:  See below  Abbreviations: NFP - Normal foveal profile. CME - cystoid macular edema. PED - pigment epithelial detachment. IRF - intraretinal fluid. SRF - subretinal fluid. EZ - ellipsoid zone. ERM - epiretinal membrane. ORA - outer retinal atrophy. ORT - outer retinal tubulation. SRHM - subretinal hyper-reflective material         Fluorescein Angiography Optos (Transit OS)       Right Eye Progression has no prior data. Early phase findings include retinal neovascularization, vascular perfusion defect, staining. Mid/Late phase findings include retinal neovascularization, vascular perfusion defect, staining.   Left Eye Progression has no prior data. Early phase findings include retinal neovascularization, vascular perfusion defect, staining. Mid/Late phase findings include staining, retinal neovascularization, vascular perfusion defect.   Notes Impression:  Active proliferative sickle cell retinopathy OU  OD: peripheral capillary nonperfusion temporally with +NVE/seafan inf temp quadrant; focal areas of hyperperfusion with late staining corresponding to CR scars OS: peripheral capillary nonperfusion temporally with multiple +NVE/seafans temporal periphery; focal areas of hyperperfusion with late staining corresponding to CR scars       Panretinal Photocoagulation - OS - Left Eye       LASER PROCEDURE NOTE  Diagnosis:   Proliferative Sickle Cell Retinopathy, LEFT EYE  Procedure:  Sectoral  pan-retinal photocoagulation using slit lamp laser, LEFT EYE  Anesthesia:  Topical  Surgeon: Rennis Chris, MD, PhD   Informed consent obtained, operative eye marked, and time out performed prior to initiation of laser.   Lumenis [id number] slit lamp laser Pattern: 3x3 square Power: 250 mW Duration: 40 msec  Spot size: 200 microns  # spots: 480 spots -- temporal periphery  Complications: None.  RTC: 2-3 wks for sectoral PRP, OD  Patient tolerated the procedure well and received written and verbal post-procedure care information/education.                  ASSESSMENT/PLAN:    ICD-10-CM   1. Sickle cell retinopathy with crisis (HCC) D57.00 Fluorescein Angiography Optos (Transit OS)   H36 Panretinal Photocoagulation - OS - Left Eye  2. 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 Retinal edema H35.81 OCT, Retina - OU - Both Eyes    Fluorescein Angiography Optos (Transit OS)  4. Nuclear sclerosis of both eyes H25.13     1,2. Sickle cell retinopathy w/ proliferative disease OU (OS > OD) - +NVE and seafans OU (OS > OD) temporal periphery - peripheral nonperfusion temporal periphery - discussed findings and prognosis - recommend sectoral PRP to areas of capillary nonperfusion OU, OS first - pt wishes to proceed with laser PRP OS today 4.16.19 - RBA of procedure discussed, questions answered - informed consent obtained and signed - see procedure note - start PF QID OS x7 days - f/u 2-3 wks for PRP OD  3. No retinal edema on exam or OCT  4. 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 - discussed diagnosis and progression - not yet visually significant - monitor for now   Ophthalmic Meds Ordered this visit:  Meds ordered this encounter  Medications  .  prednisoLONE acetate (PRED FORTE) 1 % ophthalmic suspension    Sig: Place 1 drop into the left eye 4 (four) times daily for 7 days.    Dispense:  10  mL    Refill:  0       No follow-ups on file.  There are no Patient Instructions on file for this visit.   Explained the diagnoses, plan, and follow up with the patient and they expressed understanding.  Patient expressed understanding of the importance of proper follow up care.   This document serves as a record of services personally performed by Karie Chimera, MD, PhD. It was created on their behalf by Virgilio Belling, COA, a certified ophthalmic assistant. The creation of this record is the provider's dictation and/or activities during the visit.  Electronically signed by: Virgilio Belling, COA  07/29/17 4:12 PM   Karie Chimera, M.D., Ph.D. Diseases & Surgery of the Retina and Vitreous [facility] 07/29/17  I have reviewed the above documentation for accuracy and completeness, and I agree with the above. Karie Chimera, M.D., Ph.D. 07/29/17 4:12 PM     Abbreviations: M myopia (nearsighted); A astigmatism; H hyperopia (farsighted); P presbyopia; Mrx spectacle prescription;  CTL contact lenses; OD right eye; OS left eye; OU both eyes  XT exotropia; ET esotropia; PEK punctate epithelial keratitis; PEE punctate epithelial erosions; DES dry eye syndrome; MGD meibomian gland dysfunction; ATs artificial tears; PFAT's preservative free artificial tears; NSC nuclear sclerotic cataract; PSC posterior subcapsular cataract; ERM epi-retinal membrane; PVD posterior vitreous detachment; RD retinal detachment; DM diabetes mellitus; DR diabetic retinopathy; NPDR non-proliferative diabetic retinopathy; PDR proliferative diabetic retinopathy; CSME clinically significant macular edema; DME diabetic macular edema; dbh dot blot hemorrhages; CWS cotton wool spot; POAG primary open angle glaucoma; C/D cup-to-disc ratio; HVF humphrey  visual field; GVF goldmann visual field; OCT optical coherence tomography; IOP intraocular pressure; BRVO Branch retinal vein occlusion; CRVO central retinal vein occlusion; CRAO central retinal artery occlusion; BRAO branch retinal artery occlusion; RT retinal tear; SB scleral buckle; PPV pars plana vitrectomy; VH Vitreous hemorrhage; PRP panretinal laser photocoagulation; IVK intravitreal kenalog; VMT vitreomacular traction; MH Macular hole;  NVD neovascularization of the disc; NVE neovascularization elsewhere; AREDS age related eye disease study; ARMD age related macular degeneration; POAG primary open angle glaucoma; EBMD epithelial/anterior basement membrane dystrophy; ACIOL anterior chamber intraocular lens; IOL intraocular lens; PCIOL posterior chamber intraocular lens; Phaco/IOL phacoemulsification with intraocular lens placement; PRK photorefractive keratectomy; LASIK laser assisted in situ keratomileusis; HTN hypertension; DM diabetes mellitus; COPD chronic obstructive pulmonary disease

## 2017-07-29 ENCOUNTER — Ambulatory Visit (INDEPENDENT_AMBULATORY_CARE_PROVIDER_SITE_OTHER): Payer: Medicaid Other | Admitting: Ophthalmology

## 2017-07-29 ENCOUNTER — Encounter (INDEPENDENT_AMBULATORY_CARE_PROVIDER_SITE_OTHER): Payer: Self-pay | Admitting: Ophthalmology

## 2017-07-29 DIAGNOSIS — H2513 Age-related nuclear cataract, bilateral: Secondary | ICD-10-CM

## 2017-07-29 DIAGNOSIS — H3523 Other non-diabetic proliferative retinopathy, bilateral: Secondary | ICD-10-CM | POA: Diagnosis not present

## 2017-07-29 DIAGNOSIS — H3581 Retinal edema: Secondary | ICD-10-CM | POA: Diagnosis not present

## 2017-07-29 DIAGNOSIS — D5709 Hb-ss disease with crisis with other specified complication: Secondary | ICD-10-CM

## 2017-07-29 DIAGNOSIS — D5700 Hb-SS disease with crisis, unspecified: Secondary | ICD-10-CM | POA: Diagnosis not present

## 2017-07-29 DIAGNOSIS — H36 Retinal disorders in diseases classified elsewhere: Secondary | ICD-10-CM

## 2017-07-29 MED ORDER — PREDNISOLONE ACETATE 1 % OP SUSP: 1.0000 [drp] | Freq: Four times a day (QID) | OPHTHALMIC | 0 refills | Status: AC

## 2017-08-11 NOTE — Progress Notes (Deleted)
[facility] - Clinic Note  08/12/2017     CHIEF COMPLAINT Patient presents for No chief complaint on file.   HISTORY OF PRESENT ILLNESS: Peter Kline is a 52 y.o. 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 [place], [place] [address]  HISTORICAL INFORMATION:   Selected notes from the MEDICAL RECORD NUMBER Referred by Dr. Kirtland Bouchard. 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 Hecker) [BCVA OD: 20/20 OS: 20/20-] Ocular Hx- glaucoma suspect, cataract OU PMH- sickle cell, current smoker    CURRENT MEDICATIONS: No current outpatient medications on file. (Ophthalmic Drugs)   No current facility-administered medications for this visit.  (Ophthalmic Drugs)   Current Outpatient Medications (Other)  Medication Sig  . ergocalciferol (VITAMIN D2) 50000 units capsule Take 1 capsule (50,000 Units total) by mouth once a week.  . folic acid (FOLVITE) 1 MG tablet Take 1 tablet (1 mg total) by mouth daily.  Marland Kitchen gabapentin (NEURONTIN) 300 MG capsule Take 1 capsule (300 mg total) by mouth 3 (three) times daily.  Marland Kitchen omeprazole (PRILOSEC) 40 MG capsule Take 1 capsule (40 mg total) by mouth daily.  . Oxycodone HCl 10 MG TABS Take 10 mg by mouth 4 (four) times daily.   No current facility-administered medications for this visit.  (Other)      REVIEW OF SYSTEMS:    ALLERGIES No Known Allergies  PAST MEDICAL HISTORY Past Medical History:  Diagnosis Date  . Shortness of breath   . Sickle cell anemia (HCC)    Past Surgical History:  Procedure Laterality Date  . LAPAROSCOPIC GASTROTOMY W/ REPAIR OF ULCER      FAMILY HISTORY Family History  Problem Relation Age of Onset  . Diabetes Mother   . Hypertension Mother     SOCIAL HISTORY Social History   Tobacco Use  . Smoking status: Current Every Day Smoker   Packs/day: 0.50    Types: Cigarettes  . Smokeless tobacco: Never Used  Substance Use Topics  . Alcohol use: Yes    Alcohol/week: 3.6 oz    Types: 6 Cans of beer per week    Comment: occ  . Drug use: Yes    Types: Marijuana    Comment: occ         OPHTHALMIC EXAM:   Not recorded      IMAGING AND PROCEDURES  Imaging and Procedures for 08/11/17           ASSESSMENT/PLAN:    ICD-10-CM   1. 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 Retinal edema H35.81   4. Nuclear sclerosis of both eyes H25.13     1,2. Sickle cell retinopathy w/ proliferative disease OU (OS > OD) - +NVE and seafans OU (OS > OD) temporal periphery - S/P PRP OS (04.16.19) - peripheral nonperfusion temporal periphery - discussed findings and prognosis - recommend sectoral PRP to areas of capillary nonperfusion OU - pt wishes to proceed with laser PRP OD today *** - RBA of procedure discussed, questions answered - informed consent obtained and signed - see procedure note - start PF QID OD x7 days - f/u 2-3 wks   3. No retinal edema on exam or OCT  4.  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 - discussed diagnosis and progression - not yet visually significant - monitor for now   Ophthalmic Meds Ordered this visit:  No orders of the defined types were placed in this encounter.      No follow-ups on file.  There are no Patient Instructions on file for this visit.   Explained the diagnoses, plan, and follow up with the patient and they expressed understanding.  Patient expressed understanding of the importance of proper follow up care.   This document serves as a record of services personally performed by Karie Chimera, MD, PhD. It was created on their behalf by Virgilio Belling,  COA, a certified ophthalmic assistant. The creation of this record is the provider's dictation and/or activities during the visit.  Electronically signed by: Virgilio Belling, COA  08/11/17 12:50 PM   Karie Chimera, M.D., Ph.D. Diseases & Surgery of the Retina and Vitreous [facility] 08/11/17    Abbreviations: M myopia (nearsighted); A astigmatism; H hyperopia (farsighted); P presbyopia; Mrx spectacle prescription;  CTL contact lenses; OD right eye; OS left eye; OU both eyes  XT exotropia; ET esotropia; PEK punctate epithelial keratitis; PEE punctate epithelial erosions; DES dry eye syndrome; MGD meibomian gland dysfunction; ATs artificial tears; PFAT's preservative free artificial tears; NSC nuclear sclerotic cataract; PSC posterior subcapsular cataract; ERM epi-retinal membrane; PVD posterior vitreous detachment; RD retinal detachment; DM diabetes mellitus; DR diabetic retinopathy; NPDR non-proliferative diabetic retinopathy; PDR proliferative diabetic retinopathy; CSME clinically significant macular edema; DME diabetic macular edema; dbh dot blot hemorrhages; CWS cotton wool spot; POAG primary open angle glaucoma; C/D cup-to-disc ratio; HVF humphrey visual field; GVF goldmann visual field; OCT optical coherence tomography; IOP intraocular pressure; BRVO Branch retinal vein occlusion; CRVO central retinal vein occlusion; CRAO central retinal artery occlusion; BRAO branch retinal artery occlusion; RT retinal tear; SB scleral buckle; PPV pars plana vitrectomy; VH Vitreous hemorrhage; PRP panretinal laser photocoagulation; IVK intravitreal kenalog; VMT vitreomacular traction; MH Macular hole;  NVD neovascularization of the disc; NVE neovascularization elsewhere; AREDS age related eye disease study; ARMD age related macular degeneration; POAG primary open angle glaucoma; EBMD epithelial/anterior basement membrane dystrophy; ACIOL anterior chamber intraocular lens; IOL intraocular  lens; PCIOL posterior chamber intraocular lens; Phaco/IOL phacoemulsification with intraocular lens placement; PRK photorefractive keratectomy; LASIK laser assisted in situ keratomileusis; HTN hypertension; DM diabetes mellitus; COPD chronic obstructive pulmonary disease

## 2017-08-12 ENCOUNTER — Encounter (INDEPENDENT_AMBULATORY_CARE_PROVIDER_SITE_OTHER): Payer: Medicaid Other | Admitting: Ophthalmology

## 2017-08-14 ENCOUNTER — Encounter (INDEPENDENT_AMBULATORY_CARE_PROVIDER_SITE_OTHER): Payer: Medicaid Other | Admitting: Ophthalmology

## 2017-08-14 NOTE — Progress Notes (Deleted)
[facility] - Clinic Note  08/14/2017     CHIEF COMPLAINT Patient presents for No chief complaint on file.   HISTORY OF PRESENT ILLNESS: Peter Kline is a 52 y.o. 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 [place], [place] [address]  HISTORICAL INFORMATION:   Selected notes from the MEDICAL RECORD NUMBER Referred by Dr. Kirtland Bouchard. 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 Hecker) [BCVA OD: 20/20 OS: 20/20-] Ocular Hx- glaucoma suspect, cataract OU PMH- sickle cell, current smoker    CURRENT MEDICATIONS: No current outpatient medications on file. (Ophthalmic Drugs)   No current facility-administered medications for this visit.  (Ophthalmic Drugs)   Current Outpatient Medications (Other)  Medication Sig  . ergocalciferol (VITAMIN D2) 50000 units capsule Take 1 capsule (50,000 Units total) by mouth once a week.  . folic acid (FOLVITE) 1 MG tablet Take 1 tablet (1 mg total) by mouth daily.  Marland Kitchen gabapentin (NEURONTIN) 300 MG capsule Take 1 capsule (300 mg total) by mouth 3 (three) times daily.  Marland Kitchen omeprazole (PRILOSEC) 40 MG capsule Take 1 capsule (40 mg total) by mouth daily.  . Oxycodone HCl 10 MG TABS Take 10 mg by mouth 4 (four) times daily.   No current facility-administered medications for this visit.  (Other)      REVIEW OF SYSTEMS:    ALLERGIES No Known Allergies  PAST MEDICAL HISTORY Past Medical History:  Diagnosis Date  . Shortness of breath   . Sickle cell anemia (HCC)    Past Surgical History:  Procedure Laterality Date  . LAPAROSCOPIC GASTROTOMY W/ REPAIR OF ULCER      FAMILY HISTORY Family History  Problem Relation Age of Onset  . Diabetes Mother   . Hypertension Mother     SOCIAL HISTORY Social History   Tobacco Use  . Smoking status: Current Every Day Smoker   Packs/day: 0.50    Types: Cigarettes  . Smokeless tobacco: Never Used  Substance Use Topics  . Alcohol use: Yes    Alcohol/week: 3.6 oz    Types: 6 Cans of beer per week    Comment: occ  . Drug use: Yes    Types: Marijuana    Comment: occ         OPHTHALMIC EXAM:   Not recorded      IMAGING AND PROCEDURES  Imaging and Procedures for 08/14/17           ASSESSMENT/PLAN:    ICD-10-CM   1. 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 Retinal edema H35.81   4. Nuclear sclerosis of both eyes H25.13     1,2. Sickle cell retinopathy w/ proliferative disease OU (OS > OD) - +NVE and seafans OU (OS > OD) temporal periphery - S/P PRP OS (04.16.19) - peripheral nonperfusion temporal periphery - discussed findings and prognosis - recommend sectoral PRP to areas of capillary nonperfusion OU - pt wishes to proceed with laser PRP OD today *** - RBA of procedure discussed, questions answered - informed consent obtained and signed - see procedure note - start PF QID OD x7 days - f/u 2-3 wks   3. No retinal edema on exam or OCT  4.  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 - discussed diagnosis and progression - not yet visually significant - monitor for now   Ophthalmic Meds Ordered this visit:  No orders of the defined types were placed in this encounter.      No follow-ups on file.  There are no Patient Instructions on file for this visit.   Explained the diagnoses, plan, and follow up with the patient and they expressed understanding.  Patient expressed understanding of the importance of proper follow up care.   This document serves as a record of services personally performed by Karie Chimera, MD, PhD. It was created on their behalf by Virgilio Belling,  COA, a certified ophthalmic assistant. The creation of this record is the provider's dictation and/or activities during the visit.  Electronically signed by: Virgilio Belling, COA  08/14/17 7:50 AM   Karie Chimera, M.D., Ph.D. Diseases & Surgery of the Retina and Vitreous [facility] 08/14/17    Abbreviations: M myopia (nearsighted); A astigmatism; H hyperopia (farsighted); P presbyopia; Mrx spectacle prescription;  CTL contact lenses; OD right eye; OS left eye; OU both eyes  XT exotropia; ET esotropia; PEK punctate epithelial keratitis; PEE punctate epithelial erosions; DES dry eye syndrome; MGD meibomian gland dysfunction; ATs artificial tears; PFAT's preservative free artificial tears; NSC nuclear sclerotic cataract; PSC posterior subcapsular cataract; ERM epi-retinal membrane; PVD posterior vitreous detachment; RD retinal detachment; DM diabetes mellitus; DR diabetic retinopathy; NPDR non-proliferative diabetic retinopathy; PDR proliferative diabetic retinopathy; CSME clinically significant macular edema; DME diabetic macular edema; dbh dot blot hemorrhages; CWS cotton wool spot; POAG primary open angle glaucoma; C/D cup-to-disc ratio; HVF humphrey visual field; GVF goldmann visual field; OCT optical coherence tomography; IOP intraocular pressure; BRVO Branch retinal vein occlusion; CRVO central retinal vein occlusion; CRAO central retinal artery occlusion; BRAO branch retinal artery occlusion; RT retinal tear; SB scleral buckle; PPV pars plana vitrectomy; VH Vitreous hemorrhage; PRP panretinal laser photocoagulation; IVK intravitreal kenalog; VMT vitreomacular traction; MH Macular hole;  NVD neovascularization of the disc; NVE neovascularization elsewhere; AREDS age related eye disease study; ARMD age related macular degeneration; POAG primary open angle glaucoma; EBMD epithelial/anterior basement membrane dystrophy; ACIOL anterior chamber intraocular lens; IOL intraocular  lens; PCIOL posterior chamber intraocular lens; Phaco/IOL phacoemulsification with intraocular lens placement; PRK photorefractive keratectomy; LASIK laser assisted in situ keratomileusis; HTN hypertension; DM diabetes mellitus; COPD chronic obstructive pulmonary disease

## 2017-08-18 ENCOUNTER — Ambulatory Visit: Payer: Medicaid Other | Admitting: Family Medicine

## 2017-08-19 NOTE — Progress Notes (Signed)
[facility] - Clinic Note  08/20/2017     CHIEF COMPLAINT Patient presents for Retina Follow Up   HISTORY OF PRESENT ILLNESS: Peter Kline is a 52 y.o. male who presents to the clinic today for:   HPI    Retina Follow Up    Patient presents with  Other.  In both eyes.  Severity is moderate.  Since onset it is stable.  I, the attending physician,  performed the HPI with the patient and updated documentation appropriately.          Comments    F/U sickle cell retinopathy OU; Pt states OU VA is stable; Pt states he tolerated laser well at last visit; Pt report he is prepared to have another laser today if needed; pt states he used and finished PF as directed; Pt states Ou are comfortable; Pt denies floaters, denies flashes, denies wavy VA;        Last edited by Rennis Chris, MD on 08/20/2017  3:41 PM. (History)       Referring physician: Massie Maroon, FNP [address] [place], [place] [address]  HISTORICAL INFORMATION:   Selected notes from the MEDICAL RECORD NUMBER Referred by Dr. Kirtland Bouchard. 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 Hecker) [BCVA OD: 20/20 OS: 20/20-] Ocular Hx- glaucoma suspect, cataract OU PMH- sickle cell, current smoker    CURRENT MEDICATIONS: No current outpatient medications on file. (Ophthalmic Drugs)   No current facility-administered medications for this visit.  (Ophthalmic Drugs)   Current Outpatient Medications (Other)  Medication Sig  . ergocalciferol (VITAMIN D2) 50000 units capsule Take 1 capsule (50,000 Units total) by mouth once a week.  . folic acid (FOLVITE) 1 MG tablet Take 1 tablet (1 mg total) by mouth daily.  Marland Kitchen gabapentin (NEURONTIN) 300 MG capsule Take 1 capsule (300 mg total) by mouth 3 (three) times daily.  Marland Kitchen omeprazole (PRILOSEC) 40 MG capsule Take 1 capsule (40 mg total) by mouth daily.  . Oxycodone HCl 10 MG TABS Take 10 mg by mouth 4 (four) times daily.   No current  facility-administered medications for this visit.  (Other)      REVIEW OF SYSTEMS: ROS    Positive for: Eyes, Respiratory, Heme/Lymph   Negative for: Constitutional, Gastrointestinal, Neurological, Skin, Genitourinary, Musculoskeletal, HENT, Endocrine, Cardiovascular, Psychiatric, Allergic/Imm   Last edited by Concepcion Elk, COA on 08/20/2017  2:41 PM. (History)       ALLERGIES No Known Allergies  PAST MEDICAL HISTORY Past Medical History:  Diagnosis Date  . Shortness of breath   . Sickle cell anemia (HCC)    Past Surgical History:  Procedure Laterality Date  . LAPAROSCOPIC GASTROTOMY W/ REPAIR OF ULCER      FAMILY HISTORY Family History  Problem Relation Age of Onset  . Diabetes Mother   . Hypertension Mother     SOCIAL HISTORY Social History   Tobacco Use  . Smoking status: Current Every Day Smoker    Packs/day: 0.50    Types: Cigarettes  . Smokeless tobacco: Never Used  Substance Use Topics  . Alcohol use: Yes    Alcohol/week: 3.6 oz    Types: 6 Cans of beer per week    Comment: occ  . Drug use: Yes    Types: Marijuana    Comment: occ         OPHTHALMIC EXAM:  Base Eye Exam    Visual Acuity (Snellen - Linear)  Right Left   Dist [place] 20/50 20/80   Dist ph [place] 20/30 20/30       Tonometry (Tonopen, 2:50 PM)      Right Left   Pressure 23 20       Tonometry #2 (Tonopen, 2:50 PM)      Right Left   Pressure 23 21       Pupils      Dark Light Shape React APD   Right 5 4 Round Slow None   Left 5 4 Round Slow None       Visual Fields (Counting fingers)      Left Right    Full Full       Extraocular Movement      Right Left    Full, Ortho Full, Ortho       Neuro/Psych    Oriented x3:  Yes   Mood/Affect:  Normal       Dilation    Both eyes:  1.0% Mydriacyl, 2.5% Phenylephrine @ 2:50 PM        Slit Lamp and Fundus Exam    Slit Lamp Exam      Right Left   Lids/Lashes Dermatochalasis - upper lid Dermatochalasis - upper  lid   Conjunctiva/Sclera Melanosis Melanosis   Cornea Mild Arcus, trace Punctate epithelial erosions Mild Arcus, trace Punctate epithelial erosions   Anterior Chamber Deep and quiet Deep and quiet   Iris Round and dilated Round and dilated   Lens 1+ Nuclear sclerosis, 2+ Cortical cataract 1+ Nuclear sclerosis, 2+ Cortical cataract   Vitreous Mild Vitreous syneresis Mild Vitreous syneresis, vitreous debris IT       Fundus Exam      Right Left   Disc deep cup cupped   C/D Ratio 0.8 0.8   Macula Good foveal reflex, mild Retinal pigment epithelial mottling, No heme or edema, temporal atrophy Flat, Retinal pigment epithelial mottling, temporal atrophy   Vessels Vascular attenuation; sclerotic vessels temporal periphery Vascular attenuation; sclerotic vessels temporal periphery   Periphery Attached, pigmented Chorioretinal scar at 1200, pigmented chorioretinal scars inferiorly, vitreous condensations at 0730 overlying chorioretinal scarring, pigmented scarring with overlying fibrosis at 0900, sunburst lesion at 1030 mid-zone, Attached, pigmented Chorioretinal scar temporally, fibro-vascular tissue at 0500 with hemorrhage, looks like seafan, sunburst lesion at 0730, Light laser scars in place to temporal areas of nonperfusion          IMAGING AND PROCEDURES  Imaging and Procedures for 07/29/17  OCT, Retina - OU - Both Eyes       Right Eye Quality was good. Central Foveal Thickness: 266. Progression has been stable. Findings include normal foveal contour, no IRF, no SRF, inner retinal atrophy, outer retinal atrophy, epiretinal membrane.   Left Eye Quality was good. Central Foveal Thickness: 278. Progression has been stable. Findings include normal foveal contour, no IRF, no SRF, inner retinal atrophy, outer retinal atrophy, epiretinal membrane.   Notes *Images captured and stored on drive  Diagnosis / Impression:  NFP; no IRF/SRF OU Temporal atrophy OU Mild ERM OU  Clinical  management:  See below  Abbreviations: NFP - Normal foveal profile. CME - cystoid macular edema. PED - pigment epithelial detachment. IRF - intraretinal fluid. SRF - subretinal fluid. EZ - ellipsoid zone. ERM - epiretinal membrane. ORA - outer retinal atrophy. ORT - outer retinal tubulation. SRHM - subretinal hyper-reflective material         Panretinal Photocoagulation - OD - Right Eye       LASER  PROCEDURE NOTE  Diagnosis:   Proliferative Sickle Cell Retinopathy, RIGHT EYE  Procedure:  Pan-retinal photocoagulation using slit lamp laser, RIGHT EYE  Anesthesia:  Topical  Surgeon: Rennis Chris, MD, PhD   Informed consent obtained, operative eye marked, and time out performed prior to initiation of laser.   Lumenis [id number] slit lamp laser Pattern: 2x2 square, 3x3 square Power: 210 mW Duration: 30 msec  Spot size: 200 microns  # spots: 700 spots to anterior / temporal areas of nonperfused retina  Complications: None.   RTC: 2-3 wks   Patient tolerated the procedure well and received written and verbal post-procedure care information/education.                ASSESSMENT/PLAN:    ICD-10-CM   1. 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 Retinal edema H35.81 OCT, Retina - OU - Both Eyes  4. Nuclear sclerosis of both eyes H25.13     1,2. Sickle cell retinopathy w/ proliferative disease OU (OS > OD) - +NVE and seafans OU (OS > OD) temporal periphery - S/P PRP OS (04.16.19) - peripheral nonperfusion temporal periphery - discussed findings and prognosis - recommend sectoral PRP to areas of capillary nonperfusion OD - pt wishes to proceed with laser PRP OD today 05.07.19 - RBA of procedure discussed, questions answered - informed consent obtained and signed - see procedure note - start PF QID  OD x7 days - f/u 2-3 wks -- possible PRP fill in OS  3. No retinal edema on exam or OCT  4. 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 - discussed diagnosis and progression - not yet visually significant - monitor for now   Ophthalmic Meds Ordered this visit:  No orders of the defined types were placed in this encounter.      Return in about 3 weeks (around 09/10/2017) for F/U sickle cell OU, DFE, OCT.  There are no Patient Instructions on file for this visit.   Explained the diagnoses, plan, and follow up with the patient and they expressed understanding.  Patient expressed understanding of the importance of proper follow up care.   This document serves as a record of services personally performed by Karie Chimera, MD, PhD. It was created on their behalf by Virgilio Belling, COA, a certified ophthalmic assistant. The creation of this record is the provider's dictation and/or activities during the visit.  Electronically signed by: Virgilio Belling, COA  05.07.19 4:20 PM   Karie Chimera, M.D., Ph.D. Diseases & Surgery of the Retina and Vitreous [facility] 08/19/17  I have reviewed the above documentation for accuracy and completeness, and I agree with the above. Karie Chimera, M.D., Ph.D. 08/20/17 4:20 PM    Abbreviations: M myopia (nearsighted); A astigmatism; H hyperopia (farsighted); P presbyopia; Mrx spectacle prescription;  CTL contact lenses; OD right eye; OS left eye; OU both eyes  XT exotropia; ET esotropia; PEK punctate epithelial keratitis; PEE punctate epithelial erosions; DES dry eye syndrome; MGD meibomian gland dysfunction; ATs artificial tears; PFAT's preservative free artificial tears; NSC nuclear sclerotic cataract; PSC posterior subcapsular cataract; ERM epi-retinal membrane; PVD posterior vitreous detachment; RD retinal detachment; DM  diabetes mellitus; DR diabetic retinopathy; NPDR non-proliferative diabetic retinopathy; PDR proliferative diabetic retinopathy; CSME  clinically significant macular edema; DME diabetic macular edema; dbh dot blot hemorrhages; CWS cotton wool spot; POAG primary open angle glaucoma; C/D cup-to-disc ratio; HVF humphrey visual field; GVF goldmann visual field; OCT optical coherence tomography; IOP intraocular pressure; BRVO Branch retinal vein occlusion; CRVO central retinal vein occlusion; CRAO central retinal artery occlusion; BRAO branch retinal artery occlusion; RT retinal tear; SB scleral buckle; PPV pars plana vitrectomy; VH Vitreous hemorrhage; PRP panretinal laser photocoagulation; IVK intravitreal kenalog; VMT vitreomacular traction; MH Macular hole;  NVD neovascularization of the disc; NVE neovascularization elsewhere; AREDS age related eye disease study; ARMD age related macular degeneration; POAG primary open angle glaucoma; EBMD epithelial/anterior basement membrane dystrophy; ACIOL anterior chamber intraocular lens; IOL intraocular lens; PCIOL posterior chamber intraocular lens; Phaco/IOL phacoemulsification with intraocular lens placement; [place] photorefractive keratectomy; LASIK laser assisted in situ keratomileusis; HTN hypertension; DM diabetes mellitus; COPD chronic obstructive pulmonary disease

## 2017-08-20 ENCOUNTER — Ambulatory Visit (INDEPENDENT_AMBULATORY_CARE_PROVIDER_SITE_OTHER): Payer: Medicaid Other | Admitting: Ophthalmology

## 2017-08-20 ENCOUNTER — Encounter (INDEPENDENT_AMBULATORY_CARE_PROVIDER_SITE_OTHER): Payer: Self-pay | Admitting: Ophthalmology

## 2017-08-20 DIAGNOSIS — H2513 Age-related nuclear cataract, bilateral: Secondary | ICD-10-CM | POA: Diagnosis not present

## 2017-08-20 DIAGNOSIS — H3523 Other non-diabetic proliferative retinopathy, bilateral: Secondary | ICD-10-CM

## 2017-08-20 DIAGNOSIS — D5700 Hb-SS disease with crisis, unspecified: Secondary | ICD-10-CM

## 2017-08-20 DIAGNOSIS — H36 Retinal disorders in diseases classified elsewhere: Secondary | ICD-10-CM | POA: Diagnosis not present

## 2017-08-20 DIAGNOSIS — H3581 Retinal edema: Secondary | ICD-10-CM | POA: Diagnosis not present

## 2017-09-03 ENCOUNTER — Encounter: Payer: Self-pay | Admitting: Family Medicine

## 2017-09-03 ENCOUNTER — Ambulatory Visit (INDEPENDENT_AMBULATORY_CARE_PROVIDER_SITE_OTHER): Payer: Self-pay | Admitting: Family Medicine

## 2017-09-03 VITALS — BP 128/90 | HR 70 | Temp 99.1°F | Resp 16 | Ht 72.0 in | Wt 192.0 lb

## 2017-09-03 DIAGNOSIS — D5720 Sickle-cell/Hb-C disease without crisis: Secondary | ICD-10-CM

## 2017-09-03 DIAGNOSIS — F17200 Nicotine dependence, unspecified, uncomplicated: Secondary | ICD-10-CM

## 2017-09-03 DIAGNOSIS — E559 Vitamin D deficiency, unspecified: Secondary | ICD-10-CM

## 2017-09-03 MED ORDER — FOLIC ACID 1 MG PO TABS: 1.0000 mg | ORAL_TABLET | Freq: Every day | ORAL | 11 refills | Status: DC

## 2017-09-03 MED ORDER — GABAPENTIN 300 MG PO CAPS: 300.0000 mg | ORAL_CAPSULE | Freq: Three times a day (TID) | ORAL | 1 refills | Status: DC

## 2017-09-03 NOTE — Patient Instructions (Signed)
Sickle Cell Anemia, Adult °Sickle cell anemia is a condition where your red blood cells are shaped like sickles. Red blood cells carry oxygen through the body. Sickle-shaped red blood cells do not live as long as normal red blood cells. They also clump together and block blood from flowing through the blood vessels. These things prevent the body from getting enough oxygen. Sickle cell anemia causes organ damage and pain. It also increases the risk of infection. °Follow these instructions at home: °· Drink enough fluid to keep your pee (urine) clear or pale yellow. Drink more in hot weather and during exercise. °· Do not smoke. Smoking lowers oxygen levels in the blood. °· Only take over-the-counter or prescription medicines as told by your doctor. °· Take antibiotic medicines as told by your doctor. Make sure you finish them even if you start to feel better. °· Take supplements as told by your doctor. °· Consider wearing a medical alert bracelet. This tells anyone caring for you in an emergency of your condition. °· When traveling, keep your medical information, doctors' names, and the medicines you take with you at all times. °· If you have a fever, do not take fever medicines right away. This could cover up a problem. Tell your doctor. °· Keep all follow-up visits with your doctor. Sickle cell anemia requires regular medical care. °Contact a doctor if: °You have a fever. °Get help right away if: °· You feel dizzy or faint. °· You have new belly (abdominal) pain, especially on the left side near the stomach area. °· You have a lasting, often uncomfortable and painful erection of the penis (priapism). If it is not treated right away, you will become unable to have sex (impotence). °· You have numbness in your arms or legs or you have a hard time moving them. °· You have a hard time talking. °· You have a fever or lasting symptoms for more than 2-3 days. °· You have a fever and your symptoms suddenly get  worse. °· You have signs or symptoms of infection. These include: °? Chills. °? Being more tired than normal (lethargy). °? Irritability. °? Poor eating. °? Throwing up (vomiting). °· You have pain that is not helped with medicine. °· You have shortness of breath. °· You have pain in your chest. °· You are coughing up pus-like or bloody mucus. °· You have a stiff neck. °· Your feet or hands swell or have pain. °· Your belly looks bloated. °· Your joints hurt. °This information is not intended to replace advice given to you by your health care provider. Make sure you discuss any questions you have with your health care provider. °Document Released: 01/20/2013 Document Revised: 09/07/2015 Document Reviewed: 11/11/2012 °Elsevier Interactive Patient Education © 2017 [employer] ° °

## 2017-09-03 NOTE — Progress Notes (Signed)
Subjective:    Patient ID: Peter Kline, male    DOB: 11/15/1965, 52 y.o.   MRN: [id number]  HPI Peter Kline, a 52 year old male with a history of sickle cell anemia, HbSC presents for follow-up of sickle cell anemia.  Mr. Sol Blazing states that he is doing well and has minimal complaints.  Patient also has a history of chronic pain, pain is managed by Dr. Lerry Liner in [facility].  Patient typically takes oxycodone 10 mg every 6 hours as needed for moderate to severe pain.  Current pain intensity is 4/10 characterized as intermittent and aching.  Patient last had medication this a.m. with maximum relief.  He continues to take folic acid and vitamin D consistently.  He is also hydrating consistently.  He currently denies headache, chest pains, shortness of breath, dysuria, paresthesias, nausea, vomiting, or diarrhea. To establish care. Mr. Oblinger was a patient of Dr. Billee Cashing prior to establishing care. He says that he was last evaluated in office over 1 month ago. He is complaining of chronic pain related to sickle cell anemia. Pain is typically in knees and groin. Current pain intensity is 4/10 characterized as intermittent and aching. Patient takes gabapentin 300 mg TID and Oxycodone 10 mg 4 times per day. He is requesting refills on both medications. He typically takes folic acid 1 mg daily and hydrates consistently. Patient says that he has not been hospitalized for a sickle cell crisis in greater than 1 year. He smokes marijuana periodically. Initially, patient stated that it had been a month since last used. However, he changed last marijuana use to 1 week ago. He is also a chronic everyday tobacco user. He typically smokes 1 pack every 2 days.   Past Medical History:  Diagnosis Date  . Shortness of breath   . Sickle cell anemia (HCC)    Immunization History  Administered Date(s) Administered  . Influenza,inj,Quad PF,6+ Mos 03/09/2013  . Pneumococcal Polysaccharide-23 05/02/2014   . Tdap 03/09/2013   Social History   Socioeconomic History  . Marital status: Single    Spouse name: Not on file  . Number of children: Not on file  . Years of education: Not on file  . Highest education level: Not on file  Occupational History  . Not on file  Social Needs  . Financial resource strain: Not on file  . Food insecurity:    Worry: Not on file    Inability: Not on file  . Transportation needs:    Medical: Not on file    Non-medical: Not on file  Tobacco Use  . Smoking status: Current Every Day Smoker    Packs/day: 0.50    Types: Cigarettes  . Smokeless tobacco: Never Used  Substance and Sexual Activity  . Alcohol use: Yes    Alcohol/week: 3.6 oz    Types: 6 Cans of beer per week    Comment: occ  . Drug use: Yes    Types: Marijuana    Comment: occ  . Sexual activity: Yes  Lifestyle  . Physical activity:    Days per week: Not on file    Minutes per session: Not on file  . Stress: Not on file  Relationships  . Social connections:    Talks on phone: Not on file    Gets together: Not on file    Attends religious service: Not on file    Active member of club or organization: Not on file    Attends meetings  of clubs or organizations: Not on file    Relationship status: Not on file  . Intimate partner violence:    Fear of current or ex partner: Not on file    Emotionally abused: Not on file    Physically abused: Not on file    Forced sexual activity: Not on file  Other Topics Concern  . Not on file  Social History Narrative  . Not on file   No Known Allergies  Review of Systems  Constitutional: Negative for fatigue and fever.  HENT: Negative.   Respiratory: Negative for cough and shortness of breath.   Cardiovascular: Negative.   Gastrointestinal: Negative.   Endocrine: Negative.   Genitourinary: Negative.   Musculoskeletal: Negative.   Skin: Positive for rash.  Hematological: Negative.   Psychiatric/Behavioral: Negative.         Objective:   Physical Exam  Constitutional: He is oriented to person, place, and time. He appears well-developed and well-nourished.  Eyes: Pupils are equal, round, and reactive to light.  Neck: Normal range of motion.  Cardiovascular: Normal rate, normal heart sounds and intact distal pulses.  Pulmonary/Chest: Effort normal and breath sounds normal.  Abdominal: Soft. Bowel sounds are normal.  Neurological: He is alert and oriented to person, place, and time.  Skin: Skin is warm and dry.  Psychiatric: He has a normal mood and affect. His behavior is normal. Judgment and thought content normal.         BP 128/90 (BP Location: Left Arm, Patient Position: Sitting, Cuff Size: Large)   Pulse 70   Temp 99.1 F (37.3 C) (Oral)   Resp 16   Ht 6' (1.829 m)   Wt 192 lb (87.1 kg)   SpO2 100%   BMI 26.04 kg/m  Assessment & Plan:   Sickle cell-hemoglobin C disease without crisis (HCC)  Continue folic acid 1 mg daily to prevent aplastic bone marrow crises.   Pulmonary evaluation - Patient denies severe recurrent wheezes, shortness of breath with exercise, or persistent cough. If these symptoms develop, pulmonary function tests with spirometry will be ordered, and if abnormal, plan on referral to Pulmonology for further evaluation.  . Cardiac - Routine screening for pulmonary hypertension is not recommended.  Eye - High risk of proliferative retinopathy. Annual eye exam with retinal exam recommended to patient.   Acute and chronic painful episodes -recommended patient continues to follow-up with Dr. Mayford Knife for pain management and prescription opiates. Advised patient that we will not prescribe opiates at this clinic, he expressed understanding.  - gabapentin (NEURONTIN) 300 MG capsule; Take 1 capsule (300 mg total) by mouth 3 (three) times daily.  Dispense: 90 capsule; Refill: 1 - folic acid (FOLVITE) 1 MG tablet; Take 1 tablet (1 mg total) by mouth daily.  Dispense: 30 tablet; Refill:  11 - CBC with Differential - Comprehensive metabolic panel  2. Tobacco dependence Smoking cessation instruction/counseling given:  counseled patient on the dangers of tobacco use, advised patient to stop smoking, and reviewed strategies to maximize success  3. Vitamin D deficiency - Vitamin D, 25-hydroxy; Future      The patient was given clear instructions to go to ER or return to medical center if symptoms do not improve, worsen or new problems develop. The patient verbalized understanding.     Nolon Nations  MSN, FNP-C Patient [facility] [address] [place], [place] [address] [phone]  RTC: 6 months for chronic conditions

## 2017-09-04 LAB — COMPREHENSIVE METABOLIC PANEL
ALBUMIN: 4.2 g/dL (ref 3.5–5.5)
ALK PHOS: 95 IU/L (ref 39–117)
ALT: 21 IU/L (ref 0–44)
AST: 28 IU/L (ref 0–40)
Albumin/Globulin Ratio: 1.4 (ref 1.2–2.2)
BILIRUBIN TOTAL: 0.9 mg/dL (ref 0.0–1.2)
BUN / CREAT RATIO: 15 (ref 9–20)
BUN: 14 mg/dL (ref 6–24)
CHLORIDE: 106 mmol/L (ref 96–106)
CO2: 22 mmol/L (ref 20–29)
CREATININE: 0.96 mg/dL (ref 0.76–1.27)
Calcium: 9.5 mg/dL (ref 8.7–10.2)
GFR calc non Af Amer: 91 mL/min/{1.73_m2} (ref >59)
GFR, EST AFRICAN AMERICAN: 105 mL/min/{1.73_m2} (ref >59)
GLUCOSE: 103 mg/dL — AB (ref 65–99)
Globulin, Total: 3.1 g/dL (ref 1.5–4.5)
Potassium: 3.9 mmol/L (ref 3.5–5.2)
Sodium: 144 mmol/L (ref 134–144)
TOTAL PROTEIN: 7.3 g/dL (ref 6.0–8.5)

## 2017-09-04 LAB — CBC WITH DIFFERENTIAL/PLATELET
BASOS: 1 %
Basophils Absolute: 0.1 10*3/uL (ref 0.0–0.2)
EOS (ABSOLUTE): 0.4 10*3/uL (ref 0.0–0.4)
EOS: 4 %
HEMATOCRIT: 40.9 % (ref 37.5–51.0)
HEMOGLOBIN: 13.9 g/dL (ref 13.0–17.7)
IMMATURE GRANULOCYTES: 0 %
Immature Grans (Abs): 0 10*3/uL (ref 0.0–0.1)
Lymphocytes Absolute: 2.7 10*3/uL (ref 0.7–3.1)
Lymphs: 23 %
MCH: 29.7 pg (ref 26.6–33.0)
MCHC: 34 g/dL (ref 31.5–35.7)
MCV: 87 fL (ref 79–97)
MONOCYTES: 11 %
MONOS ABS: 1.3 10*3/uL — AB (ref 0.1–0.9)
Neutrophils Absolute: 7.1 10*3/uL — ABNORMAL HIGH (ref 1.4–7.0)
Neutrophils: 61 %
PLATELETS: 175 10*3/uL (ref 150–450)
RBC: 4.68 x10E6/uL (ref 4.14–5.80)
RDW: 16.4 % — ABNORMAL HIGH (ref 12.3–15.4)
WBC: 11.5 10*3/uL — AB (ref 3.4–10.8)

## 2017-09-04 LAB — VITAMIN D 25 HYDROXY (VIT D DEFICIENCY, FRACTURES): VIT D 25 HYDROXY: 29.4 ng/mL — AB (ref 30.0–100.0)

## 2017-09-12 ENCOUNTER — Ambulatory Visit: Payer: Medicaid Other | Admitting: Family Medicine

## 2017-09-15 ENCOUNTER — Encounter (INDEPENDENT_AMBULATORY_CARE_PROVIDER_SITE_OTHER): Payer: Medicaid Other | Admitting: Ophthalmology

## 2017-11-03 ENCOUNTER — Other Ambulatory Visit: Payer: PRIVATE HEALTH INSURANCE

## 2018-03-06 ENCOUNTER — Ambulatory Visit: Payer: Medicaid Other | Admitting: Family Medicine

## 2018-09-04 ENCOUNTER — Ambulatory Visit: Payer: PRIVATE HEALTH INSURANCE | Admitting: Family Medicine

## 2018-11-16 ENCOUNTER — Ambulatory Visit: Payer: Medicaid Other | Admitting: Family Medicine

## 2018-11-19 ENCOUNTER — Ambulatory Visit: Payer: Medicaid Other | Admitting: Family Medicine

## 2018-11-23 ENCOUNTER — Ambulatory Visit: Payer: Medicaid Other | Admitting: Family Medicine

## 2018-11-25 ENCOUNTER — Encounter: Payer: Self-pay | Admitting: Family Medicine

## 2018-11-25 ENCOUNTER — Other Ambulatory Visit: Payer: Self-pay

## 2018-11-25 ENCOUNTER — Ambulatory Visit (INDEPENDENT_AMBULATORY_CARE_PROVIDER_SITE_OTHER): Payer: Self-pay | Admitting: Family Medicine

## 2018-11-25 ENCOUNTER — Ambulatory Visit: Payer: Medicaid Other | Admitting: Family Medicine

## 2018-11-25 VITALS — BP 146/98 | HR 61 | Temp 98.2°F | Resp 16 | Ht 72.0 in | Wt 182.0 lb

## 2018-11-25 DIAGNOSIS — K219 Gastro-esophageal reflux disease without esophagitis: Secondary | ICD-10-CM

## 2018-11-25 DIAGNOSIS — E559 Vitamin D deficiency, unspecified: Secondary | ICD-10-CM

## 2018-11-25 DIAGNOSIS — D5720 Sickle-cell/Hb-C disease without crisis: Secondary | ICD-10-CM

## 2018-11-25 DIAGNOSIS — A048 Other specified bacterial intestinal infections: Secondary | ICD-10-CM

## 2018-11-25 LAB — POCT URINALYSIS DIPSTICK
Bilirubin, UA: NEGATIVE
Blood, UA: NEGATIVE
Glucose, UA: NEGATIVE
Ketones, UA: NEGATIVE
Leukocytes, UA: NEGATIVE
Nitrite, UA: NEGATIVE
Protein, UA: NEGATIVE
Spec Grav, UA: 1.02 (ref 1.010–1.025)
Urobilinogen, UA: 0.2 E.U./dL
pH, UA: 7 (ref 5.0–8.0)

## 2018-11-25 MED ORDER — FOLIC ACID 1 MG PO TABS: 1.0000 mg | ORAL_TABLET | Freq: Every day | ORAL | 11 refills | Status: DC

## 2018-11-25 MED ORDER — OXYCODONE HCL 5 MG PO TABS: 5.0000 mg | ORAL_TABLET | Freq: Four times a day (QID) | ORAL | 0 refills | Status: DC | PRN

## 2018-11-25 MED ORDER — ERGOCALCIFEROL 1.25 MG (50000 UT) PO CAPS: 50000.0000 [IU] | ORAL_CAPSULE | ORAL | 1 refills | Status: DC

## 2018-11-25 MED ORDER — GABAPENTIN 300 MG PO CAPS: 300.0000 mg | ORAL_CAPSULE | Freq: Three times a day (TID) | ORAL | 2 refills | Status: DC

## 2018-11-25 MED ORDER — OMEPRAZOLE 40 MG PO CPDR: 40.0000 mg | DELAYED_RELEASE_CAPSULE | Freq: Every day | ORAL | 1 refills | Status: DC

## 2018-11-25 NOTE — Progress Notes (Signed)
[facility] INTERNAL MEDICINE AND SICKLE CELL CARE  SICKLE CELL ANEMIA FOLLOW UP VISIT PROVIDER: Mike GipAndre Toye Rouillard, FNP    Subjective:   Peter Kline  is a 53 y.o.  male who  has a past medical history of Shortness of breath and Sickle cell anemia (HCC). presents for a follow up for Sickle Cell Anemia. Patient last seen in this clinic 08/2017. Patient has been followed by [facility] with the last visit on 08/07/2018. He states that he has not been able to come to appts due to working long hours. He states that he has been having several crises over the past few months and has not had pain medications. He states that he has a history of severe heartburn and had surgery for the removal of an ulcer. He takes otc tums without relief.   Review of Systems  Constitutional: Negative.   HENT: Negative.   Eyes: Negative.   Respiratory: Negative.   Cardiovascular: Negative.   Gastrointestinal: Negative.   Genitourinary: Negative.   Musculoskeletal: Negative.   Skin: Negative.   Neurological: Negative.   Psychiatric/Behavioral: Negative.     Objective:   Objective  BP (!) 146/98 (BP Location: Left Arm, Patient Position: Sitting, Cuff Size: Normal) Comment: manually  Pulse 61   Temp 98.2 F (36.8 C) (Oral)   Resp 16   Ht 6' (1.829 m)   Wt 182 lb (82.6 kg)   SpO2 100%   BMI 24.68 kg/m   Wt Readings from Last 3 Encounters:  11/25/18 182 lb (82.6 kg)  09/03/17 192 lb (87.1 kg)  06/18/17 201 lb (91.2 kg)     Physical Exam   Assessment/Plan:   Assessment   Encounter Diagnoses  Name Primary?  . Sickle cell-hemoglobin C disease without crisis (HCC) Yes  . Vitamin D deficiency   . Gastroesophageal reflux disease without esophagitis      Plan  1. Sickle cell-hemoglobin C disease without crisis (HCC) - Urinalysis Dipstick - folic acid (FOLVITE) 1 MG tablet; Take 1 tablet (1 mg total) by mouth daily.  Dispense: 30 tablet; Refill: 11 - gabapentin (NEURONTIN) 300 MG  capsule; Take 1 capsule (300 mg total) by mouth 3 (three) times daily.  Dispense: 90 capsule; Refill: 2 - Comprehensive metabolic panel - Ferritin - VITAMIN D 25 Hydroxy (Vit-D Deficiency, Fractures) - Hemoglobinopathy Evaluation - oxyCODONE (OXY IR/ROXICODONE) 5 MG immediate release tablet; Take 1 tablet (5 mg total) by mouth every 6 (six) hours as needed for up to 15 days for moderate pain or severe pain.  Dispense: 60 tablet; Refill: 0  2. Vitamin D deficiency - ergocalciferol (VITAMIN D2) 1.25 MG (50000 UT) capsule; Take 1 capsule (50,000 Units total) by mouth once a week.  Dispense: 30 capsule; Refill: 1 - VITAMIN D 25 Hydroxy (Vit-D Deficiency, Fractures)  3. Gastroesophageal reflux disease without esophagitis - omeprazole (PRILOSEC) 40 MG capsule; Take 1 capsule (40 mg total) by mouth daily.  Dispense: 30 capsule; Refill: 1 - H. pylori breath test  Return to care as scheduled and prn. Patient verbalized understanding and agreed with plan of care.   1. Sickle cell disease -  We discussed the need for good hydration, monitoring of hydration status, avoidance of heat, cold, stress, and infection triggers. We discussed the risks and benefits of Hydrea, including bone marrow suppression, the possibility of GI upset, skin ulcers, hair thinning, and teratogenicity. The patient was reminded of the need to seek medical attention of any symptoms of bleeding, anemia, or infection.  Continue folic acid 1 mg daily to prevent aplastic bone marrow crises.   2. Pulmonary evaluation - Patient denies severe recurrent wheezes, shortness of breath with exercise, or persistent cough. If these symptoms develop, pulmonary function tests with spirometry will be ordered, and if abnormal, plan on referral to Pulmonology for further evaluation.  3. Cardiac - Routine screening for pulmonary hypertension is not recommended.  4. Eye - High risk of proliferative retinopathy. Annual eye exam with retinal exam  recommended to patient.  5. Immunization status -  Yearly influenza vaccination is recommended, as well as being up to date with Meningococcal and Pneumococcal vaccines.   6. Acute and chronic painful episodes - We discussed that pt is to receive Schedule II prescriptions only from [place]. Pt is also aware that the prescription history is available to [place] online through the [facility] CSRS. Controlled substance agreement signed. We reminded BUELL PARCEL that all patients receiving Schedule II narcotics must be seen for follow within one month of prescription being requested. We reviewed the terms of our pain agreement, including the need to keep medicines in a safe locked location away from children or pets, and the need to report excess sedation or constipation, measures to avoid constipation, and policies related to early refills and stolen prescriptions. According to the [place] Chronic Pain Initiative program, we have reviewed details related to analgesia, adverse effects, aberrant behaviors.  7. Iron overload from chronic transfusion.  Not applicable at this time.  If this occurs will use Exjade for management.   8. Vitamin D deficiency - Drisdol 50,000 units weekly. Patient encouraged to take as prescribed.   The above recommendations are taken from the NIH Evidence-Based Management of Sickle Cell Disease: Expert Panel Report, 20149.   Ms. Andr L. Nathaneil Canary, FNP-BC Patient [facility] [address] [place], [place] [address] [phone]  This note has been created with Dragon speech recognition software and smart phrase technology. Any transcriptional errors are unintentional.

## 2018-11-25 NOTE — Patient Instructions (Signed)
Sickle Cell Anemia, Adult ° °Sickle cell anemia is a condition where your red blood cells are shaped like sickles. Red blood cells carry oxygen through the body. Sickle-shaped cells do not live as long as normal red blood cells. They also clump together and block blood from flowing through the blood vessels. This prevents the body from getting enough oxygen. Sickle cell anemia causes organ damage and pain. It also increases the risk of infection. °Follow these instructions at home: °Medicines °· Take over-the-counter and prescription medicines only as told by your doctor. °· If you were prescribed an antibiotic medicine, take it as told by your doctor. Do not stop taking the antibiotic even if you start to feel better. °· If you develop a fever, do not take medicines to lower the fever right away. Tell your doctor about the fever. °Managing pain, stiffness, and swelling °· Try these methods to help with pain: °? Use a heating pad. °? Take a warm bath. °? Distract yourself, such as by watching TV. °Eating and drinking °· Drink enough fluid to keep your pee (urine) clear or pale yellow. Drink more in hot weather and during exercise. °· Limit or avoid alcohol. °· Eat a healthy diet. Eat plenty of fruits, vegetables, whole grains, and lean protein. °· Take vitamins and supplements as told by your doctor. °Traveling °· When traveling, keep these with you: °? Your medical information. °? The names of your doctors. °? Your medicines. °· If you need to take an airplane, talk to your doctor first. °Activity °· Rest often. °· Avoid exercises that make your heart beat much faster, such as jogging. °General instructions °· Do not use products that have nicotine or tobacco, such as cigarettes and e-cigarettes. If you need help quitting, ask your doctor. °· Consider wearing a medical alert bracelet. °· Avoid being in high places (high altitudes), such as mountains. °· Avoid very hot or cold temperatures. °· Avoid places where the  temperature changes a lot. °· Keep all follow-up visits as told by your doctor. This is important. °Contact a doctor if: °· A joint hurts. °· Your feet or hands hurt or swell. °· You feel tired (fatigued). °Get help right away if: °· You have symptoms of infection. These include: °? Fever. °? Chills. °? Being very tired. °? Irritability. °? Poor eating. °? Throwing up (vomiting). °· You feel dizzy or faint. °· You have new stomach pain, especially on the left side. °· You have a an erection (priapism) that lasts more than 4 hours. °· You have numbness in your arms or legs. °· You have a hard time moving your arms or legs. °· You have trouble talking. °· You have pain that does not go away when you take medicine. °· You are short of breath. °· You are breathing fast. °· You have a long-term cough. °· You have pain in your chest. °· You have a bad headache. °· You have a stiff neck. °· Your stomach looks bloated even though you did not eat much. °· Your skin is pale. °· You suddenly cannot see well. °Summary °· Sickle cell anemia is a condition where your red blood cells are shaped like sickles. °· Follow your doctor's advice on ways to manage pain, food to eat, activities to do, and steps to take for safe travel. °· Get medical help right away if you have any signs of infection, such as a fever. °This information is not intended to replace advice given to you by   your health care provider. Make sure you discuss any questions you have with your health care provider. °Document Released: 01/20/2013 Document Revised: 07/24/2018 Document Reviewed: 05/07/2016 °Elsevier Patient Education © 2020 Elsevier Inc. ° °

## 2018-11-26 ENCOUNTER — Ambulatory Visit: Payer: Medicaid Other | Admitting: Family Medicine

## 2018-11-27 ENCOUNTER — Telehealth: Payer: Self-pay

## 2018-11-27 LAB — H. PYLORI BREATH TEST: H pylori Breath Test: POSITIVE — AB

## 2018-11-27 MED ORDER — CLARITHROMYCIN 500 MG PO TABS: 500.0000 mg | ORAL_TABLET | Freq: Two times a day (BID) | ORAL | 0 refills | Status: AC

## 2018-11-27 MED ORDER — AMOXICILLIN 500 MG PO CAPS: 1000.0000 mg | ORAL_CAPSULE | Freq: Two times a day (BID) | ORAL | 0 refills | Status: AC

## 2018-11-27 MED ORDER — OMEPRAZOLE 20 MG PO CPDR: 20.0000 mg | DELAYED_RELEASE_CAPSULE | Freq: Two times a day (BID) | ORAL | 0 refills | Status: DC

## 2018-11-27 NOTE — Addendum Note (Signed)
Addended by: Genelle Bal on: 11/27/2018 04:04 PM   Modules accepted: Orders

## 2018-11-27 NOTE — Telephone Encounter (Signed)
Called and spoke with patient, advised that he was positive for H pylori and that this is a bacteria that can cause heartburn, stomach pain and ulcers. Advised that amoxicillin, clarithromycin and omeprazole has been sent to pharmacy for him to take twice daily for 14 days. Advised that he needs to finish all meds and stop omeprazole 40 that was given yesterday for the 2 weeks while he takes these. Patient verbalized understanding. Thanks!

## 2018-11-27 NOTE — Telephone Encounter (Signed)
-----   Message from Lanae Boast, [place] sent at 11/27/2018  4:07 PM EDT ----- Please let patient know that he was positive for H pylori. This is a bacteria that can cause heartburn, stomach pain and ulcers. I sent amoxicillin, clarithromycin and omeprazole to the pharmacy on file. He is to take these medication BID x 14 days. Please finish all meds. I wrote a prescription yesterday for omeprazole 40 mg. He can stop this for now and take the other omeprazole that is 2 times per day. If the medications are too expensive, please let the office know and I can send them to [facility].

## 2018-11-30 ENCOUNTER — Telehealth: Payer: Self-pay

## 2018-11-30 LAB — HEMOGLOBINOPATHY EVALUATION
Ferritin: 259 ng/mL (ref 30–400)
Hematocrit: 41.1 % (ref 37.5–51.0)
Hemoglobin: 13.6 g/dL (ref 13.0–17.7)
Hgb A2 Quant: 4.7 % — ABNORMAL HIGH (ref 1.8–3.2)
Hgb A: 0 % — ABNORMAL LOW (ref 96.4–98.8)
Hgb C: 44.6 % — ABNORMAL HIGH
Hgb F Quant: 0 % (ref 0.0–2.0)
Hgb S: 50.7 % — ABNORMAL HIGH
Hgb Solubility: POSITIVE — AB
Hgb Variant: 0 %
MCH: 28.9 pg (ref 26.6–33.0)
MCHC: 33.1 g/dL (ref 31.5–35.7)
MCV: 87 fL (ref 79–97)
Platelets: 198 10*3/uL (ref 150–450)
RBC: 4.7 x10E6/uL (ref 4.14–5.80)
RDW: 15.6 % — ABNORMAL HIGH (ref 11.6–15.4)
WBC: 11.6 10*3/uL — ABNORMAL HIGH (ref 3.4–10.8)

## 2018-11-30 LAB — COMPREHENSIVE METABOLIC PANEL
ALT: 21 IU/L (ref 0–44)
AST: 30 IU/L (ref 0–40)
Albumin/Globulin Ratio: 1.5 (ref 1.2–2.2)
Albumin: 4.5 g/dL (ref 3.8–4.9)
Alkaline Phosphatase: 91 IU/L (ref 39–117)
BUN/Creatinine Ratio: 8 — ABNORMAL LOW (ref 9–20)
BUN: 7 mg/dL (ref 6–24)
Bilirubin Total: 0.8 mg/dL (ref 0.0–1.2)
CO2: 22 mmol/L (ref 20–29)
Calcium: 9.7 mg/dL (ref 8.7–10.2)
Chloride: 99 mmol/L (ref 96–106)
Creatinine, Ser: 0.91 mg/dL (ref 0.76–1.27)
GFR calc Af Amer: 110 mL/min/{1.73_m2} (ref >59)
GFR calc non Af Amer: 95 mL/min/{1.73_m2} (ref >59)
Globulin, Total: 3 g/dL (ref 1.5–4.5)
Glucose: 91 mg/dL (ref 65–99)
Potassium: 4.1 mmol/L (ref 3.5–5.2)
Sodium: 137 mmol/L (ref 134–144)
Total Protein: 7.5 g/dL (ref 6.0–8.5)

## 2018-11-30 LAB — VITAMIN D 25 HYDROXY (VIT D DEFICIENCY, FRACTURES): Vit D, 25-Hydroxy: 28.8 ng/mL — ABNORMAL LOW (ref 30.0–100.0)

## 2018-12-01 NOTE — Telephone Encounter (Signed)
Called and spoke with patient, he states he only has medicaid family planning. I advised that they do not cover medications and he would need to pay out of pocket if no other coverage. Patient verbalized understanding. Thanks!

## 2018-12-16 ENCOUNTER — Other Ambulatory Visit: Payer: Self-pay | Admitting: Family Medicine

## 2018-12-16 ENCOUNTER — Telehealth: Payer: Self-pay

## 2018-12-16 DIAGNOSIS — D5720 Sickle-cell/Hb-C disease without crisis: Secondary | ICD-10-CM

## 2018-12-16 MED ORDER — OXYCODONE HCL 5 MG PO TABS: 5.0000 mg | ORAL_TABLET | Freq: Four times a day (QID) | ORAL | 0 refills | Status: DC | PRN

## 2018-12-16 NOTE — Progress Notes (Signed)
Reviewed  Substance Reporting system prior to prescribing opiate medications. No inconsistencies noted.   

## 2018-12-23 ENCOUNTER — Encounter (HOSPITAL_COMMUNITY): Payer: Self-pay | Admitting: *Deleted

## 2018-12-23 ENCOUNTER — Encounter (HOSPITAL_COMMUNITY): Payer: Self-pay

## 2018-12-31 ENCOUNTER — Telehealth: Payer: Self-pay | Admitting: Family Medicine

## 2018-12-31 DIAGNOSIS — D5720 Sickle-cell/Hb-C disease without crisis: Secondary | ICD-10-CM

## 2018-12-31 MED ORDER — OXYCODONE HCL 5 MG PO TABS: 5.0000 mg | ORAL_TABLET | Freq: Four times a day (QID) | ORAL | 0 refills | Status: DC | PRN

## 2018-12-31 NOTE — Telephone Encounter (Signed)
Refill request for oxycodone. Please advise.  

## 2019-01-19 ENCOUNTER — Telehealth: Payer: Self-pay | Admitting: Family Medicine

## 2019-01-19 ENCOUNTER — Other Ambulatory Visit: Payer: Self-pay | Admitting: Internal Medicine

## 2019-01-19 DIAGNOSIS — D5720 Sickle-cell/Hb-C disease without crisis: Secondary | ICD-10-CM

## 2019-01-19 MED ORDER — OXYCODONE HCL 5 MG PO TABS: 5.0000 mg | ORAL_TABLET | Freq: Four times a day (QID) | ORAL | 0 refills | Status: DC | PRN

## 2019-01-19 NOTE — Telephone Encounter (Signed)
Refilled

## 2019-01-19 NOTE — Telephone Encounter (Signed)
Refill request for oxycodone.  

## 2019-02-04 ENCOUNTER — Telehealth: Payer: Self-pay | Admitting: Internal Medicine

## 2019-02-04 ENCOUNTER — Other Ambulatory Visit: Payer: Self-pay | Admitting: Internal Medicine

## 2019-02-04 DIAGNOSIS — D5720 Sickle-cell/Hb-C disease without crisis: Secondary | ICD-10-CM

## 2019-02-04 MED ORDER — OXYCODONE HCL 5 MG PO TABS: 5.0000 mg | ORAL_TABLET | Freq: Four times a day (QID) | ORAL | 0 refills | Status: DC | PRN

## 2019-02-04 NOTE — Telephone Encounter (Signed)
Refilled

## 2019-02-23 ENCOUNTER — Other Ambulatory Visit: Payer: Self-pay | Admitting: Internal Medicine

## 2019-02-23 ENCOUNTER — Telehealth: Payer: Self-pay | Admitting: Internal Medicine

## 2019-02-23 DIAGNOSIS — D5720 Sickle-cell/Hb-C disease without crisis: Secondary | ICD-10-CM

## 2019-02-23 MED ORDER — OXYCODONE HCL 5 MG PO TABS: 5.0000 mg | ORAL_TABLET | Freq: Four times a day (QID) | ORAL | 0 refills | Status: DC | PRN

## 2019-02-23 NOTE — Telephone Encounter (Signed)
Refill request for oxycodone. Please advise.  

## 2019-02-23 NOTE — Telephone Encounter (Signed)
Refilled

## 2019-02-25 ENCOUNTER — Ambulatory Visit: Payer: Medicaid Other | Admitting: Family Medicine

## 2019-03-10 ENCOUNTER — Other Ambulatory Visit: Payer: Self-pay | Admitting: Internal Medicine

## 2019-03-10 ENCOUNTER — Telehealth: Payer: Self-pay | Admitting: Internal Medicine

## 2019-03-10 DIAGNOSIS — D5720 Sickle-cell/Hb-C disease without crisis: Secondary | ICD-10-CM

## 2019-03-10 MED ORDER — OXYCODONE HCL 5 MG PO TABS: 5.0000 mg | ORAL_TABLET | Freq: Four times a day (QID) | ORAL | 0 refills | Status: DC | PRN

## 2019-03-10 NOTE — Telephone Encounter (Signed)
Refilled

## 2019-03-10 NOTE — Telephone Encounter (Signed)
Refill request for oxycodone. Please advise.  

## 2019-03-22 ENCOUNTER — Other Ambulatory Visit: Payer: Self-pay | Admitting: Family Medicine

## 2019-03-22 ENCOUNTER — Telehealth: Payer: Self-pay | Admitting: Internal Medicine

## 2019-03-22 DIAGNOSIS — E559 Vitamin D deficiency, unspecified: Secondary | ICD-10-CM

## 2019-03-22 DIAGNOSIS — D5720 Sickle-cell/Hb-C disease without crisis: Secondary | ICD-10-CM

## 2019-03-22 DIAGNOSIS — A048 Other specified bacterial intestinal infections: Secondary | ICD-10-CM

## 2019-03-22 MED ORDER — OMEPRAZOLE 20 MG PO CPDR: 20.0000 mg | DELAYED_RELEASE_CAPSULE | Freq: Two times a day (BID) | ORAL | 0 refills | Status: DC

## 2019-03-22 MED ORDER — FOLIC ACID 1 MG PO TABS: 1.0000 mg | ORAL_TABLET | Freq: Every day | ORAL | 11 refills | Status: DC

## 2019-03-22 MED ORDER — ERGOCALCIFEROL 1.25 MG (50000 UT) PO CAPS: 50000.0000 [IU] | ORAL_CAPSULE | ORAL | 1 refills | Status: DC

## 2019-03-22 MED ORDER — OXYCODONE HCL 5 MG PO TABS: 5.0000 mg | ORAL_TABLET | Freq: Four times a day (QID) | ORAL | 0 refills | Status: AC | PRN

## 2019-03-22 MED ORDER — GABAPENTIN 300 MG PO CAPS: 300.0000 mg | ORAL_CAPSULE | Freq: Three times a day (TID) | ORAL | 2 refills | Status: DC

## 2019-03-22 NOTE — Progress Notes (Signed)
Reviewed patient's chart. He has not had a recent UDS. Also, reviewed PDMP, no inconsistencies. He has a scheduled appointment on 04/06/2019.   Meds ordered this encounter  Medications  . ergocalciferol (VITAMIN D2) 1.25 MG (50000 UT) capsule    Sig: Take 1 capsule (50,000 Units total) by mouth once a week.    Dispense:  30 capsule    Refill:  1    Order Specific Question:   Supervising Provider    Answer:   Tresa Garter [id number]  . gabapentin (NEURONTIN) 300 MG capsule    Sig: Take 1 capsule (300 mg total) by mouth 3 (three) times daily.    Dispense:  90 capsule    Refill:  2    Order Specific Question:   Supervising Provider    Answer:   Tresa Garter [id number]  . omeprazole (PRILOSEC) 20 MG capsule    Sig: Take 1 capsule (20 mg total) by mouth 2 (two) times daily for 14 days.    Dispense:  28 capsule    Refill:  0    Order Specific Question:   Supervising Provider    Answer:   Tresa Garter [id number]  . folic acid (FOLVITE) 1 MG tablet    Sig: Take 1 tablet (1 mg total) by mouth daily.    Dispense:  30 tablet    Refill:  11    Order Specific Question:   Supervising Provider    Answer:   Tresa Garter [id number]  . oxyCODONE (OXY IR/ROXICODONE) 5 MG immediate release tablet    Sig: Take 1 tablet (5 mg total) by mouth every 6 (six) hours as needed for up to 15 days for moderate pain or severe pain.    Dispense:  60 tablet    Refill:  0    Order Specific Question:   Supervising Provider    Answer:   Tresa Garter [[id number]]    Donia Pounds  APRN, MSN, FNP-C Patient [facility] [address] [place], [place] [address] [phone]

## 2019-03-23 NOTE — Telephone Encounter (Signed)
done

## 2019-04-06 ENCOUNTER — Ambulatory Visit: Payer: Medicaid Other | Admitting: Family Medicine

## 2019-04-10 ENCOUNTER — Telehealth: Payer: Self-pay | Admitting: Family Medicine
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
## 2019-04-12 ENCOUNTER — Other Ambulatory Visit: Payer: Self-pay | Admitting: Family Medicine

## 2019-04-12 ENCOUNTER — Telehealth: Payer: Self-pay | Admitting: Internal Medicine

## 2019-04-13 ENCOUNTER — Other Ambulatory Visit: Payer: Self-pay | Admitting: Family Medicine

## 2019-04-13 DIAGNOSIS — Z79891 Long term (current) use of opiate analgesic: Secondary | ICD-10-CM

## 2019-04-13 MED ORDER — OXYCODONE HCL 5 MG PO CAPS: 5.0000 mg | ORAL_CAPSULE | Freq: Four times a day (QID) | ORAL | 0 refills | Status: AC | PRN

## 2019-04-13 NOTE — Progress Notes (Unsigned)
Reviewed PDMP substance reporting system prior to prescribing opiate medications. No inconsistencies noted. Patient warrants first available appointment for medication management.  Meds ordered this encounter  Medications  . oxycodone (OXY-IR) 5 MG capsule    Sig: Take 1 capsule (5 mg total) by mouth every 6 (six) hours as needed for up to 7 days. Needs clinic appointment    Dispense:  28 capsule    Refill:  0    Order Specific Question:   Supervising Provider    Answer:   Tresa Garter [[id number]]    Donia Pounds  APRN, MSN, FNP-C Patient [facility] [address] [place], [place] [address] [phone]

## 2019-04-13 NOTE — Telephone Encounter (Signed)
done

## 2019-04-14 ENCOUNTER — Telehealth: Payer: Self-pay | Admitting: Family Medicine

## 2019-04-19 NOTE — Telephone Encounter (Signed)
done

## 2019-04-20 ENCOUNTER — Ambulatory Visit: Payer: Medicaid Other | Admitting: Family Medicine

## 2020-12-24 ENCOUNTER — Emergency Department (HOSPITAL_COMMUNITY): Payer: Self-pay

## 2020-12-24 ENCOUNTER — Other Ambulatory Visit: Payer: Self-pay

## 2020-12-24 ENCOUNTER — Inpatient Hospital Stay (HOSPITAL_COMMUNITY)
Admission: EM | Admit: 2020-12-24 | Discharge: 2020-12-26 | DRG: 183 | Disposition: A | Discharge status: Home / Self Care | Payer: Self-pay | Attending: Surgery | Admitting: Surgery

## 2020-12-24 ENCOUNTER — Emergency Department (HOSPITAL_COMMUNITY)
Admission: EM | Admit: 2020-12-24 | Discharge: 2020-12-24 | Disposition: A | Discharge status: Home / Self Care | Payer: Self-pay | Attending: Physician Assistant | Admitting: Physician Assistant

## 2020-12-24 ENCOUNTER — Encounter (HOSPITAL_COMMUNITY): Payer: Self-pay

## 2020-12-24 DIAGNOSIS — F1010 Alcohol abuse, uncomplicated: Secondary | ICD-10-CM | POA: Diagnosis present

## 2020-12-24 DIAGNOSIS — F17210 Nicotine dependence, cigarettes, uncomplicated: Secondary | ICD-10-CM | POA: Diagnosis present

## 2020-12-24 DIAGNOSIS — S0003XA Contusion of scalp, initial encounter: Secondary | ICD-10-CM | POA: Diagnosis present

## 2020-12-24 DIAGNOSIS — J189 Pneumonia, unspecified organism: Secondary | ICD-10-CM | POA: Diagnosis present

## 2020-12-24 DIAGNOSIS — R0782 Intercostal pain: Secondary | ICD-10-CM | POA: Insufficient documentation

## 2020-12-24 DIAGNOSIS — Z20822 Contact with and (suspected) exposure to covid-19: Secondary | ICD-10-CM | POA: Diagnosis present

## 2020-12-24 DIAGNOSIS — D571 Sickle-cell disease without crisis: Secondary | ICD-10-CM | POA: Diagnosis present

## 2020-12-24 DIAGNOSIS — D5700 Hb-SS disease with crisis, unspecified: Secondary | ICD-10-CM | POA: Diagnosis present

## 2020-12-24 DIAGNOSIS — K219 Gastro-esophageal reflux disease without esophagitis: Secondary | ICD-10-CM | POA: Diagnosis present

## 2020-12-24 DIAGNOSIS — E559 Vitamin D deficiency, unspecified: Secondary | ICD-10-CM | POA: Diagnosis present

## 2020-12-24 DIAGNOSIS — Z5321 Procedure and treatment not carried out due to patient leaving prior to being seen by health care provider: Secondary | ICD-10-CM | POA: Insufficient documentation

## 2020-12-24 DIAGNOSIS — S2242XA Multiple fractures of ribs, left side, initial encounter for closed fracture: Secondary | ICD-10-CM

## 2020-12-24 DIAGNOSIS — Y92008 Other place in unspecified non-institutional (private) residence as the place of occurrence of the external cause: Secondary | ICD-10-CM

## 2020-12-24 DIAGNOSIS — H1132 Conjunctival hemorrhage, left eye: Secondary | ICD-10-CM | POA: Diagnosis present

## 2020-12-24 DIAGNOSIS — S0001XA Abrasion of scalp, initial encounter: Secondary | ICD-10-CM | POA: Diagnosis present

## 2020-12-24 DIAGNOSIS — Z833 Family history of diabetes mellitus: Secondary | ICD-10-CM

## 2020-12-24 DIAGNOSIS — S225XXA Flail chest, initial encounter for closed fracture: Principal | ICD-10-CM | POA: Diagnosis present

## 2020-12-24 DIAGNOSIS — S2232XA Fracture of one rib, left side, initial encounter for closed fracture: Secondary | ICD-10-CM | POA: Diagnosis present

## 2020-12-24 DIAGNOSIS — Z8249 Family history of ischemic heart disease and other diseases of the circulatory system: Secondary | ICD-10-CM

## 2020-12-24 LAB — I-STAT CHEM 8, ED
BUN: 7 mg/dL (ref 6–20)
Calcium, Ion: 1.05 mmol/L — ABNORMAL LOW (ref 1.15–1.40)
Chloride: 104 mmol/L (ref 98–111)
Creatinine, Ser: 0.8 mg/dL (ref 0.61–1.24)
Glucose, Bld: 100 mg/dL — ABNORMAL HIGH (ref 70–99)
HCT: 42 % (ref 39.0–52.0)
Hemoglobin: 14.3 g/dL (ref 13.0–17.0)
Potassium: 3.3 mmol/L — ABNORMAL LOW (ref 3.5–5.1)
Sodium: 139 mmol/L (ref 135–145)
TCO2: 25 mmol/L (ref 22–32)

## 2020-12-24 LAB — ETHANOL: Alcohol, Ethyl (B): 51 mg/dL — ABNORMAL HIGH (ref <10)

## 2020-12-24 LAB — COMPREHENSIVE METABOLIC PANEL
ALT: 37 U/L (ref 0–44)
AST: 47 U/L — ABNORMAL HIGH (ref 15–41)
Albumin: 3.8 g/dL (ref 3.5–5.0)
Alkaline Phosphatase: 90 U/L (ref 38–126)
Anion gap: 12 (ref 5–15)
BUN: 7 mg/dL (ref 6–20)
CO2: 23 mmol/L (ref 22–32)
Calcium: 9.1 mg/dL (ref 8.9–10.3)
Chloride: 102 mmol/L (ref 98–111)
Creatinine, Ser: 0.87 mg/dL (ref 0.61–1.24)
GFR, Estimated: >60 mL/min (ref >60)
Glucose, Bld: 103 mg/dL — ABNORMAL HIGH (ref 70–99)
Potassium: 3.3 mmol/L — ABNORMAL LOW (ref 3.5–5.1)
Sodium: 137 mmol/L (ref 135–145)
Total Bilirubin: 1 mg/dL (ref 0.3–1.2)
Total Protein: 7.3 g/dL (ref 6.5–8.1)

## 2020-12-24 LAB — CBC
HCT: 37.4 % — ABNORMAL LOW (ref 39.0–52.0)
Hemoglobin: 13.7 g/dL (ref 13.0–17.0)
MCH: 30.5 pg (ref 26.0–34.0)
MCHC: 36.6 g/dL — ABNORMAL HIGH (ref 30.0–36.0)
MCV: 83.3 fL (ref 80.0–100.0)
Platelets: 204 10*3/uL (ref 150–400)
RBC: 4.49 MIL/uL (ref 4.22–5.81)
RDW: 13.5 % (ref 11.5–15.5)
WBC: 11.1 10*3/uL — ABNORMAL HIGH (ref 4.0–10.5)
nRBC: 0.5 % — ABNORMAL HIGH (ref 0.0–0.2)

## 2020-12-24 LAB — SAMPLE TO BLOOD BANK

## 2020-12-24 LAB — PROTIME-INR
INR: 0.9 (ref 0.8–1.2)
Prothrombin Time: 12 seconds (ref 11.4–15.2)

## 2020-12-24 LAB — LACTIC ACID, PLASMA: Lactic Acid, Venous: 3 mmol/L (ref 0.5–1.9)

## 2020-12-24 MED ORDER — ONDANSETRON 4 MG PO TBDP: 4.0000 mg | ORAL_TABLET | Freq: Once | ORAL | Status: DC

## 2020-12-24 NOTE — ED Notes (Signed)
Pt asked about wait time explained the wait time. Pt family came in and Pt left with family.
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
## 2020-12-25 ENCOUNTER — Emergency Department (HOSPITAL_COMMUNITY): Payer: Self-pay

## 2020-12-25 ENCOUNTER — Encounter (HOSPITAL_COMMUNITY): Payer: Self-pay

## 2020-12-25 DIAGNOSIS — D5700 Hb-SS disease with crisis, unspecified: Secondary | ICD-10-CM | POA: Diagnosis present

## 2020-12-25 DIAGNOSIS — S2232XA Fracture of one rib, left side, initial encounter for closed fracture: Secondary | ICD-10-CM | POA: Diagnosis present

## 2020-12-25 DIAGNOSIS — S225XXA Flail chest, initial encounter for closed fracture: Secondary | ICD-10-CM | POA: Diagnosis present

## 2020-12-25 LAB — URINALYSIS, ROUTINE W REFLEX MICROSCOPIC
Bilirubin Urine: NEGATIVE
Glucose, UA: NEGATIVE mg/dL
Ketones, ur: NEGATIVE mg/dL
Leukocytes,Ua: NEGATIVE
Nitrite: NEGATIVE
Protein, ur: NEGATIVE mg/dL
Specific Gravity, Urine: 1.005 (ref 1.005–1.030)
pH: 6 (ref 5.0–8.0)

## 2020-12-25 LAB — RETICULOCYTES
Immature Retic Fract: 24.8 % — ABNORMAL HIGH (ref 2.3–15.9)
RBC.: 4.36 MIL/uL (ref 4.22–5.81)
Retic Count, Absolute: 146.1 10*3/uL (ref 19.0–186.0)
Retic Ct Pct: 3.4 % — ABNORMAL HIGH (ref 0.4–3.1)

## 2020-12-25 LAB — CBC WITH DIFFERENTIAL/PLATELET
Band Neutrophils: 1 %
Basophils Relative: 1 %
Blasts: NONE SEEN %
Eosinophils Relative: 0 %
HCT: 36.3 % — ABNORMAL LOW (ref 39.0–52.0)
Hemoglobin: 13.3 g/dL (ref 13.0–17.0)
Lymphocytes Relative: 12 %
MCH: 30.1 pg (ref 26.0–34.0)
MCHC: 36.6 g/dL — ABNORMAL HIGH (ref 30.0–36.0)
MCV: 82.1 fL (ref 80.0–100.0)
Metamyelocytes Relative: NONE SEEN %
Monocytes Relative: 9 %
Myelocytes: NONE SEEN %
Neutrophils Relative %: 77 %
Platelets: 206 10*3/uL (ref 150–400)
Promyelocytes Relative: NONE SEEN %
RBC Morphology: NORMAL
RBC: 4.42 MIL/uL (ref 4.22–5.81)
RDW: 13.5 % (ref 11.5–15.5)
WBC Morphology: NORMAL
WBC: 14 10*3/uL — ABNORMAL HIGH (ref 4.0–10.5)
nRBC: NONE SEEN % (ref 0.0–0.2)
nRBC: NONE SEEN /100 WBC

## 2020-12-25 LAB — COMPREHENSIVE METABOLIC PANEL
ALT: 36 U/L (ref 0–44)
AST: 46 U/L — ABNORMAL HIGH (ref 15–41)
Albumin: 4.1 g/dL (ref 3.5–5.0)
Alkaline Phosphatase: 87 U/L (ref 38–126)
Anion gap: 12 (ref 5–15)
BUN: 8 mg/dL (ref 6–20)
CO2: 23 mmol/L (ref 22–32)
Calcium: 9.2 mg/dL (ref 8.9–10.3)
Chloride: 102 mmol/L (ref 98–111)
Creatinine, Ser: 0.7 mg/dL (ref 0.61–1.24)
GFR, Estimated: >60 mL/min (ref >60)
Glucose, Bld: 114 mg/dL — ABNORMAL HIGH (ref 70–99)
Potassium: 3.6 mmol/L (ref 3.5–5.1)
Sodium: 137 mmol/L (ref 135–145)
Total Bilirubin: 1.2 mg/dL (ref 0.3–1.2)
Total Protein: 7.9 g/dL (ref 6.5–8.1)

## 2020-12-25 LAB — HIV ANTIBODY (ROUTINE TESTING W REFLEX): HIV Screen 4th Generation wRfx: NONREACTIVE

## 2020-12-25 LAB — RESP PANEL BY RT-PCR (FLU A&B, COVID) ARPGX2
Influenza A by PCR: NEGATIVE
Influenza B by PCR: NEGATIVE
SARS Coronavirus 2 by RT PCR: NEGATIVE

## 2020-12-25 LAB — LACTIC ACID, PLASMA
Lactic Acid, Venous: 1 mmol/L (ref 0.5–1.9)
Lactic Acid, Venous: 1.6 mmol/L (ref 0.5–1.9)

## 2020-12-25 MED ORDER — HYDROMORPHONE HCL 1 MG/ML IJ SOLN
1.0000 mg | Freq: Once | INTRAMUSCULAR | Status: AC
  Administered 2020-12-25: 1 mg via INTRAVENOUS
  Filled 2020-12-25: qty 1

## 2020-12-25 MED ORDER — ONDANSETRON HCL 4 MG/2ML IJ SOLN: 4.0000 mg | Freq: Four times a day (QID) | INTRAMUSCULAR | Status: DC | PRN

## 2020-12-25 MED ORDER — LORAZEPAM 1 MG PO TABS
1.0000 mg | ORAL_TABLET | ORAL | Status: DC | PRN
  Filled 2020-12-25: qty 2

## 2020-12-25 MED ORDER — THIAMINE HCL 100 MG PO TABS
100.0000 mg | ORAL_TABLET | Freq: Every day | ORAL | Status: DC
  Administered 2020-12-25 – 2020-12-26 (×2): 100 mg via ORAL
  Filled 2020-12-25 (×2): qty 1

## 2020-12-25 MED ORDER — LORAZEPAM 2 MG/ML IJ SOLN: 1.0000 mg | INTRAMUSCULAR | Status: DC | PRN

## 2020-12-25 MED ORDER — ADULT MULTIVITAMIN W/MINERALS CH
1.0000 | ORAL_TABLET | Freq: Every day | ORAL | Status: DC
  Administered 2020-12-25 – 2020-12-26 (×2): 1 via ORAL
  Filled 2020-12-25 (×2): qty 1

## 2020-12-25 MED ORDER — HYDROMORPHONE HCL 1 MG/ML IJ SOLN: 1.0000 mg | INTRAMUSCULAR | Status: DC | PRN

## 2020-12-25 MED ORDER — HYDROMORPHONE HCL 1 MG/ML IJ SOLN
0.5000 mg | INTRAMUSCULAR | Status: DC | PRN
  Administered 2020-12-25 – 2020-12-26 (×5): 0.5 mg via INTRAVENOUS
  Filled 2020-12-25 (×5): qty 0.5

## 2020-12-25 MED ORDER — IOHEXOL 350 MG/ML SOLN
80.0000 mL | Freq: Once | INTRAVENOUS | Status: AC | PRN
  Administered 2020-12-25: 80 mL via INTRAVENOUS

## 2020-12-25 MED ORDER — FOLIC ACID 1 MG PO TABS
1.0000 mg | ORAL_TABLET | Freq: Every day | ORAL | Status: DC
  Administered 2020-12-25 – 2020-12-26 (×2): 1 mg via ORAL
  Filled 2020-12-25 (×2): qty 1

## 2020-12-25 MED ORDER — ONDANSETRON 4 MG PO TBDP: 4.0000 mg | ORAL_TABLET | Freq: Four times a day (QID) | ORAL | Status: DC | PRN

## 2020-12-25 MED ORDER — THIAMINE HCL 100 MG/ML IJ SOLN: 100.0000 mg | Freq: Every day | INTRAMUSCULAR | Status: DC

## 2020-12-25 MED ORDER — SODIUM CHLORIDE 0.9 % IV SOLN
2.0000 g | Freq: Once | INTRAVENOUS | Status: AC
  Administered 2020-12-25: 2 g via INTRAVENOUS
  Filled 2020-12-25: qty 2

## 2020-12-25 MED ORDER — VANCOMYCIN HCL 1500 MG/300ML IV SOLN
1500.0000 mg | Freq: Two times a day (BID) | INTRAVENOUS | Status: DC
  Filled 2020-12-25: qty 300

## 2020-12-25 MED ORDER — ENOXAPARIN SODIUM 30 MG/0.3ML IJ SOSY
30.0000 mg | PREFILLED_SYRINGE | Freq: Two times a day (BID) | INTRAMUSCULAR | Status: DC
  Filled 2020-12-25: qty 0.3

## 2020-12-25 MED ORDER — DEXTROSE-NACL 5-0.9 % IV SOLN: INTRAVENOUS | Status: DC

## 2020-12-25 MED ORDER — OXYCODONE HCL 5 MG PO TABS
5.0000 mg | ORAL_TABLET | ORAL | Status: DC | PRN
  Administered 2020-12-25 – 2020-12-26 (×3): 10 mg via ORAL
  Filled 2020-12-25 (×3): qty 2

## 2020-12-25 MED ORDER — ACETAMINOPHEN 500 MG PO TABS
1000.0000 mg | ORAL_TABLET | Freq: Four times a day (QID) | ORAL | Status: DC
  Administered 2020-12-25 – 2020-12-26 (×4): 1000 mg via ORAL
  Filled 2020-12-25 (×4): qty 2

## 2020-12-25 MED ORDER — VANCOMYCIN HCL 1500 MG/300ML IV SOLN
1500.0000 mg | Freq: Once | INTRAVENOUS | Status: AC
  Administered 2020-12-25: 1500 mg via INTRAVENOUS
  Filled 2020-12-25: qty 300
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
## 2020-12-25 NOTE — ED Notes (Signed)
ED Provider at bedside. 

## 2020-12-25 NOTE — ED Notes (Signed)
Report called and given to jasmine, RN [id number].
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
## 2020-12-25 NOTE — ED Notes (Signed)
[facility] called for transport. 

## 2020-12-25 NOTE — ED Notes (Signed)
Attempted to obtain urine specimen, but pt states he had already gone
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
## 2020-12-25 NOTE — Progress Notes (Signed)
Patient arrived to [address] room 12 alert and oriented x4. Pain level 0. Call light in reach and bed in lowest position. Will continue to monitor pt.
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
## 2020-12-26 LAB — CBC
HCT: 34.2 % — ABNORMAL LOW (ref 39.0–52.0)
Hemoglobin: 12.6 g/dL — ABNORMAL LOW (ref 13.0–17.0)
MCH: 30.1 pg (ref 26.0–34.0)
MCHC: 36.8 g/dL — ABNORMAL HIGH (ref 30.0–36.0)
MCV: 81.8 fL (ref 80.0–100.0)
Platelets: 191 10*3/uL (ref 150–400)
RBC: 4.18 MIL/uL — ABNORMAL LOW (ref 4.22–5.81)
RDW: 13.3 % (ref 11.5–15.5)
WBC: 11.6 10*3/uL — ABNORMAL HIGH (ref 4.0–10.5)
nRBC: 0.5 % — ABNORMAL HIGH (ref 0.0–0.2)

## 2020-12-26 LAB — BASIC METABOLIC PANEL
Anion gap: 6 (ref 5–15)
BUN: <5 mg/dL — ABNORMAL LOW (ref 6–20)
CO2: 28 mmol/L (ref 22–32)
Calcium: 8.9 mg/dL (ref 8.9–10.3)
Chloride: 102 mmol/L (ref 98–111)
Creatinine, Ser: 0.84 mg/dL (ref 0.61–1.24)
GFR, Estimated: >60 mL/min (ref >60)
Glucose, Bld: 96 mg/dL (ref 70–99)
Potassium: 3.6 mmol/L (ref 3.5–5.1)
Sodium: 136 mmol/L (ref 135–145)

## 2020-12-26 MED ORDER — METHOCARBAMOL 750 MG PO TABS: 750.0000 mg | ORAL_TABLET | Freq: Three times a day (TID) | ORAL | 0 refills | Status: AC

## 2020-12-26 MED ORDER — ACETAMINOPHEN 500 MG PO TABS: 1000.0000 mg | ORAL_TABLET | Freq: Four times a day (QID) | ORAL | Status: AC

## 2020-12-26 MED ORDER — OXYCODONE HCL 5 MG PO TABS: 5.0000 mg | ORAL_TABLET | ORAL | 0 refills | Status: AC | PRN

## 2020-12-26 MED ORDER — METHOCARBAMOL 750 MG PO TABS
750.0000 mg | ORAL_TABLET | Freq: Three times a day (TID) | ORAL | Status: DC
  Administered 2020-12-26: 750 mg via ORAL
  Filled 2020-12-26: qty 1

## 2020-12-26 MED ORDER — HYDROMORPHONE HCL 1 MG/ML IJ SOLN: 0.5000 mg | INTRAMUSCULAR | Status: DC | PRN
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
## 2020-12-26 NOTE — Progress Notes (Signed)
Discharge instructions (including medications) discussed with and copy provided to patient/caregiver 

## 2020-12-26 NOTE — Plan of Care (Signed)
  Problem: Education: Goal: Knowledge of General Education information will improve Description: Including pain rating scale, medication(s)/side effects and non-pharmacologic comfort measures Outcome: Adequate for Discharge   

## 2020-12-30 LAB — CULTURE, BLOOD (ROUTINE X 2)
Culture: NO GROWTH
Culture: NO GROWTH
Special Requests: ADEQUATE
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
## 2023-01-09 ENCOUNTER — Encounter (HOSPITAL_COMMUNITY): Payer: Self-pay | Admitting: Emergency Medicine

## 2023-01-09 ENCOUNTER — Other Ambulatory Visit: Payer: Self-pay

## 2023-01-09 ENCOUNTER — Emergency Department (HOSPITAL_COMMUNITY)
Admission: EM | Admit: 2023-01-09 | Discharge: 2023-01-09 | Disposition: A | Discharge status: Home / Self Care | Payer: 59 | Attending: Emergency Medicine | Admitting: Emergency Medicine

## 2023-01-09 DIAGNOSIS — Z5321 Procedure and treatment not carried out due to patient leaving prior to being seen by health care provider: Secondary | ICD-10-CM | POA: Insufficient documentation

## 2023-01-09 DIAGNOSIS — L0211 Cutaneous abscess of neck: Secondary | ICD-10-CM | POA: Diagnosis present

## 2023-01-09 NOTE — ED Triage Notes (Signed)
Pt arrived POV c/o abscess/cyst on left side of neck that has been present x3-5 days. Pt reports pain at site, denies fever or any other symptoms.

## 2023-02-11 ENCOUNTER — Emergency Department (HOSPITAL_COMMUNITY): Payer: 59

## 2023-02-11 ENCOUNTER — Inpatient Hospital Stay (HOSPITAL_COMMUNITY)
Admission: EM | Admit: 2023-02-11 | Discharge: 2023-03-03 | DRG: 870 | Disposition: A | Discharge status: Home / Self Care | Payer: 59 | Attending: Internal Medicine | Admitting: Internal Medicine

## 2023-02-11 ENCOUNTER — Other Ambulatory Visit: Payer: Self-pay

## 2023-02-11 ENCOUNTER — Encounter (HOSPITAL_COMMUNITY): Payer: Self-pay

## 2023-02-11 DIAGNOSIS — G0490 Encephalitis and encephalomyelitis, unspecified: Secondary | ICD-10-CM | POA: Diagnosis not present

## 2023-02-11 DIAGNOSIS — Z794 Long term (current) use of insulin: Secondary | ICD-10-CM

## 2023-02-11 DIAGNOSIS — G928 Other toxic encephalopathy: Secondary | ICD-10-CM | POA: Diagnosis present

## 2023-02-11 DIAGNOSIS — Y848 Other medical procedures as the cause of abnormal reaction of the patient, or of later complication, without mention of misadventure at the time of the procedure: Secondary | ICD-10-CM | POA: Diagnosis not present

## 2023-02-11 DIAGNOSIS — J1529 Pneumonia due to other staphylococcus: Secondary | ICD-10-CM | POA: Diagnosis not present

## 2023-02-11 DIAGNOSIS — A419 Sepsis, unspecified organism: Principal | ICD-10-CM | POA: Insufficient documentation

## 2023-02-11 DIAGNOSIS — E870 Hyperosmolality and hypernatremia: Secondary | ICD-10-CM | POA: Diagnosis not present

## 2023-02-11 DIAGNOSIS — I6340 Cerebral infarction due to embolism of unspecified cerebral artery: Secondary | ICD-10-CM | POA: Diagnosis present

## 2023-02-11 DIAGNOSIS — I82453 Acute embolism and thrombosis of peroneal vein, bilateral: Secondary | ICD-10-CM | POA: Diagnosis not present

## 2023-02-11 DIAGNOSIS — R4182 Altered mental status, unspecified: Secondary | ICD-10-CM | POA: Diagnosis not present

## 2023-02-11 DIAGNOSIS — E785 Hyperlipidemia, unspecified: Secondary | ICD-10-CM | POA: Diagnosis present

## 2023-02-11 DIAGNOSIS — Q2112 Patent foramen ovale: Secondary | ICD-10-CM

## 2023-02-11 DIAGNOSIS — T501X5A Adverse effect of loop [high-ceiling] diuretics, initial encounter: Secondary | ICD-10-CM | POA: Diagnosis not present

## 2023-02-11 DIAGNOSIS — G629 Polyneuropathy, unspecified: Secondary | ICD-10-CM | POA: Diagnosis present

## 2023-02-11 DIAGNOSIS — Z79899 Other long term (current) drug therapy: Secondary | ICD-10-CM

## 2023-02-11 DIAGNOSIS — F1210 Cannabis abuse, uncomplicated: Secondary | ICD-10-CM | POA: Diagnosis present

## 2023-02-11 DIAGNOSIS — E43 Unspecified severe protein-calorie malnutrition: Secondary | ICD-10-CM | POA: Insufficient documentation

## 2023-02-11 DIAGNOSIS — E872 Acidosis, unspecified: Secondary | ICD-10-CM | POA: Diagnosis present

## 2023-02-11 DIAGNOSIS — I10 Essential (primary) hypertension: Secondary | ICD-10-CM | POA: Diagnosis present

## 2023-02-11 DIAGNOSIS — Z8249 Family history of ischemic heart disease and other diseases of the circulatory system: Secondary | ICD-10-CM

## 2023-02-11 DIAGNOSIS — D571 Sickle-cell disease without crisis: Secondary | ICD-10-CM | POA: Diagnosis present

## 2023-02-11 DIAGNOSIS — J9602 Acute respiratory failure with hypercapnia: Secondary | ICD-10-CM | POA: Diagnosis present

## 2023-02-11 DIAGNOSIS — G8929 Other chronic pain: Secondary | ICD-10-CM | POA: Diagnosis present

## 2023-02-11 DIAGNOSIS — I749 Embolism and thrombosis of unspecified artery: Secondary | ICD-10-CM

## 2023-02-11 DIAGNOSIS — I63443 Cerebral infarction due to embolism of bilateral cerebellar arteries: Secondary | ICD-10-CM | POA: Diagnosis present

## 2023-02-11 DIAGNOSIS — J95851 Ventilator associated pneumonia: Secondary | ICD-10-CM | POA: Diagnosis not present

## 2023-02-11 DIAGNOSIS — Z781 Physical restraint status: Secondary | ICD-10-CM

## 2023-02-11 DIAGNOSIS — E559 Vitamin D deficiency, unspecified: Secondary | ICD-10-CM | POA: Diagnosis present

## 2023-02-11 DIAGNOSIS — R300 Dysuria: Secondary | ICD-10-CM | POA: Diagnosis not present

## 2023-02-11 DIAGNOSIS — Y95 Nosocomial condition: Secondary | ICD-10-CM | POA: Diagnosis not present

## 2023-02-11 DIAGNOSIS — N4832 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: Secondary | ICD-10-CM | POA: Diagnosis present

## 2023-02-11 DIAGNOSIS — Z9889 Other specified postprocedural states: Secondary | ICD-10-CM

## 2023-02-11 DIAGNOSIS — F10131 Alcohol abuse with withdrawal delirium: Secondary | ICD-10-CM | POA: Diagnosis present

## 2023-02-11 DIAGNOSIS — E860 Dehydration: Secondary | ICD-10-CM | POA: Diagnosis not present

## 2023-02-11 DIAGNOSIS — D75838 Other thrombocytosis: Secondary | ICD-10-CM

## 2023-02-11 DIAGNOSIS — I739 Peripheral vascular disease, unspecified: Secondary | ICD-10-CM | POA: Diagnosis present

## 2023-02-11 DIAGNOSIS — Z6824 Body mass index (BMI) 24.0-24.9, adult: Secondary | ICD-10-CM

## 2023-02-11 DIAGNOSIS — Z7901 Long term (current) use of anticoagulants: Secondary | ICD-10-CM

## 2023-02-11 DIAGNOSIS — R6520 Severe sepsis without septic shock: Secondary | ICD-10-CM | POA: Diagnosis present

## 2023-02-11 DIAGNOSIS — J189 Pneumonia, unspecified organism: Secondary | ICD-10-CM

## 2023-02-11 DIAGNOSIS — R29723 NIHSS score 23: Secondary | ICD-10-CM | POA: Diagnosis present

## 2023-02-11 DIAGNOSIS — R471 Dysarthria and anarthria: Secondary | ICD-10-CM | POA: Diagnosis present

## 2023-02-11 DIAGNOSIS — Z87828 Personal history of other (healed) physical injury and trauma: Secondary | ICD-10-CM

## 2023-02-11 DIAGNOSIS — R1310 Dysphagia, unspecified: Secondary | ICD-10-CM | POA: Diagnosis not present

## 2023-02-11 DIAGNOSIS — E7220 Disorder of urea cycle metabolism, unspecified: Secondary | ICD-10-CM | POA: Diagnosis not present

## 2023-02-11 DIAGNOSIS — K0889 Other specified disorders of teeth and supporting structures: Secondary | ICD-10-CM | POA: Diagnosis present

## 2023-02-11 DIAGNOSIS — Z8711 Personal history of peptic ulcer disease: Secondary | ICD-10-CM

## 2023-02-11 DIAGNOSIS — J690 Pneumonitis due to inhalation of food and vomit: Secondary | ICD-10-CM

## 2023-02-11 DIAGNOSIS — D721 Eosinophilia, unspecified: Secondary | ICD-10-CM | POA: Diagnosis not present

## 2023-02-11 DIAGNOSIS — H9190 Unspecified hearing loss, unspecified ear: Secondary | ICD-10-CM | POA: Diagnosis present

## 2023-02-11 DIAGNOSIS — D696 Thrombocytopenia, unspecified: Secondary | ICD-10-CM | POA: Diagnosis present

## 2023-02-11 DIAGNOSIS — E876 Hypokalemia: Secondary | ICD-10-CM | POA: Diagnosis not present

## 2023-02-11 DIAGNOSIS — H36829 Proliferative sickle-cell retinopathy, unspecified eye: Secondary | ICD-10-CM | POA: Diagnosis present

## 2023-02-11 DIAGNOSIS — G9389 Other specified disorders of brain: Secondary | ICD-10-CM | POA: Diagnosis not present

## 2023-02-11 DIAGNOSIS — Z1152 Encounter for screening for COVID-19: Secondary | ICD-10-CM

## 2023-02-11 DIAGNOSIS — I2699 Other pulmonary embolism without acute cor pulmonale: Secondary | ICD-10-CM | POA: Diagnosis not present

## 2023-02-11 DIAGNOSIS — Z1624 Resistance to multiple antibiotics: Secondary | ICD-10-CM | POA: Diagnosis not present

## 2023-02-11 DIAGNOSIS — G9341 Metabolic encephalopathy: Secondary | ICD-10-CM

## 2023-02-11 DIAGNOSIS — I82443 Acute embolism and thrombosis of tibial vein, bilateral: Secondary | ICD-10-CM | POA: Diagnosis not present

## 2023-02-11 DIAGNOSIS — R590 Localized enlarged lymph nodes: Secondary | ICD-10-CM | POA: Diagnosis present

## 2023-02-11 DIAGNOSIS — Z833 Family history of diabetes mellitus: Secondary | ICD-10-CM

## 2023-02-11 DIAGNOSIS — G9340 Encephalopathy, unspecified: Secondary | ICD-10-CM

## 2023-02-11 DIAGNOSIS — K219 Gastro-esophageal reflux disease without esophagitis: Secondary | ICD-10-CM | POA: Diagnosis present

## 2023-02-11 DIAGNOSIS — F17210 Nicotine dependence, cigarettes, uncomplicated: Secondary | ICD-10-CM | POA: Diagnosis present

## 2023-02-11 DIAGNOSIS — I6300 Cerebral infarction due to thrombosis of unspecified precerebral artery: Secondary | ICD-10-CM

## 2023-02-11 DIAGNOSIS — I639 Cerebral infarction, unspecified: Secondary | ICD-10-CM

## 2023-02-11 DIAGNOSIS — T510X4A Toxic effect of ethanol, undetermined, initial encounter: Secondary | ICD-10-CM | POA: Diagnosis present

## 2023-02-11 DIAGNOSIS — J9601 Acute respiratory failure with hypoxia: Secondary | ICD-10-CM

## 2023-02-11 LAB — LACTIC ACID, PLASMA: Lactic Acid, Venous: 0.8 mmol/L (ref 0.5–1.9)

## 2023-02-11 LAB — RAPID URINE DRUG SCREEN, HOSP PERFORMED
Amphetamines: NOT DETECTED
Barbiturates: NOT DETECTED
Benzodiazepines: NOT DETECTED
Cocaine: NOT DETECTED
Opiates: NOT DETECTED
Tetrahydrocannabinol: POSITIVE — AB

## 2023-02-11 LAB — CBC
HCT: 33.6 % — ABNORMAL LOW (ref 39.0–52.0)
Hemoglobin: 11.8 g/dL — ABNORMAL LOW (ref 13.0–17.0)
MCH: 28.1 pg (ref 26.0–34.0)
MCHC: 35.1 g/dL (ref 30.0–36.0)
MCV: 80 fL (ref 80.0–100.0)
Platelets: 127 10*3/uL — ABNORMAL LOW (ref 150–400)
RBC: 4.2 MIL/uL — ABNORMAL LOW (ref 4.22–5.81)
RDW: 15.9 % — ABNORMAL HIGH (ref 11.5–15.5)
WBC: 30 10*3/uL — ABNORMAL HIGH (ref 4.0–10.5)
nRBC: 0.1 % (ref 0.0–0.2)

## 2023-02-11 LAB — URINALYSIS, ROUTINE W REFLEX MICROSCOPIC
Bacteria, UA: NONE SEEN
Bilirubin Urine: NEGATIVE
Glucose, UA: NEGATIVE mg/dL
Ketones, ur: NEGATIVE mg/dL
Leukocytes,Ua: NEGATIVE
Nitrite: NEGATIVE
Protein, ur: NEGATIVE mg/dL
Specific Gravity, Urine: 1.013 (ref 1.005–1.030)
pH: 5 (ref 5.0–8.0)

## 2023-02-11 LAB — I-STAT CHEM 8, ED
BUN: 19 mg/dL (ref 6–20)
Calcium, Ion: 1.17 mmol/L (ref 1.15–1.40)
Chloride: 113 mmol/L — ABNORMAL HIGH (ref 98–111)
Creatinine, Ser: 1 mg/dL (ref 0.61–1.24)
Glucose, Bld: 107 mg/dL — ABNORMAL HIGH (ref 70–99)
HCT: 38 % — ABNORMAL LOW (ref 39.0–52.0)
Hemoglobin: 12.9 g/dL — ABNORMAL LOW (ref 13.0–17.0)
Potassium: 3.6 mmol/L (ref 3.5–5.1)
Sodium: 144 mmol/L (ref 135–145)
TCO2: 20 mmol/L — ABNORMAL LOW (ref 22–32)

## 2023-02-11 LAB — COMPREHENSIVE METABOLIC PANEL
ALT: 28 U/L (ref 0–44)
AST: 46 U/L — ABNORMAL HIGH (ref 15–41)
Albumin: 3.8 g/dL (ref 3.5–5.0)
Alkaline Phosphatase: 99 U/L (ref 38–126)
Anion gap: 11 (ref 5–15)
BUN: 16 mg/dL (ref 6–20)
CO2: 20 mmol/L — ABNORMAL LOW (ref 22–32)
Calcium: 9.3 mg/dL (ref 8.9–10.3)
Chloride: 111 mmol/L (ref 98–111)
Creatinine, Ser: 1.07 mg/dL (ref 0.61–1.24)
GFR, Estimated: >60 mL/min (ref >60)
Glucose, Bld: 105 mg/dL — ABNORMAL HIGH (ref 70–99)
Potassium: 4.2 mmol/L (ref 3.5–5.1)
Sodium: 142 mmol/L (ref 135–145)
Total Bilirubin: 1.9 mg/dL — ABNORMAL HIGH (ref 0.3–1.2)
Total Protein: 8.1 g/dL (ref 6.5–8.1)

## 2023-02-11 LAB — CBG MONITORING, ED: Glucose-Capillary: 114 mg/dL — ABNORMAL HIGH (ref 70–99)

## 2023-02-11 LAB — DIFFERENTIAL
Abs Immature Granulocytes: 0.2 10*3/uL — ABNORMAL HIGH (ref 0.00–0.07)
Basophils Absolute: 0.1 10*3/uL (ref 0.0–0.1)
Basophils Relative: 0 %
Eosinophils Absolute: 0.1 10*3/uL (ref 0.0–0.5)
Eosinophils Relative: 0 %
Immature Granulocytes: 1 %
Lymphocytes Relative: 5 %
Lymphs Abs: 1.5 10*3/uL (ref 0.7–4.0)
Monocytes Absolute: 2.3 10*3/uL — ABNORMAL HIGH (ref 0.1–1.0)
Monocytes Relative: 8 %
Neutro Abs: 25.7 10*3/uL — ABNORMAL HIGH (ref 1.7–7.7)
Neutrophils Relative %: 86 %

## 2023-02-11 LAB — ACETAMINOPHEN LEVEL: Acetaminophen (Tylenol), Serum: <10 ug/mL — ABNORMAL LOW (ref 10–30)

## 2023-02-11 LAB — APTT: aPTT: 21 s — ABNORMAL LOW (ref 24–36)

## 2023-02-11 LAB — AMMONIA: Ammonia: 29 umol/L (ref 9–35)

## 2023-02-11 LAB — PROTIME-INR
INR: 1 (ref 0.8–1.2)
Prothrombin Time: 13.8 s (ref 11.4–15.2)

## 2023-02-11 LAB — SALICYLATE LEVEL: Salicylate Lvl: <7 mg/dL — ABNORMAL LOW (ref 7.0–30.0)

## 2023-02-11 LAB — ETHANOL: Alcohol, Ethyl (B): <10 mg/dL (ref <10)

## 2023-02-11 MED ORDER — KETAMINE HCL 50 MG/5ML IJ SOSY
PREFILLED_SYRINGE | INTRAMUSCULAR | Status: AC
  Administered 2023-02-11: 50 mg via INTRAVENOUS
  Filled 2023-02-11: qty 5

## 2023-02-11 MED ORDER — VANCOMYCIN HCL 1750 MG/350ML IV SOLN
1750.0000 mg | Freq: Once | INTRAVENOUS | Status: AC
  Administered 2023-02-11: 1750 mg via INTRAVENOUS
  Filled 2023-02-11: qty 350

## 2023-02-11 MED ORDER — SODIUM CHLORIDE 0.9 % IV SOLN: INTRAVENOUS | Status: AC

## 2023-02-11 MED ORDER — KETAMINE HCL 50 MG/5ML IJ SOSY
PREFILLED_SYRINGE | INTRAMUSCULAR | Status: AC
  Filled 2023-02-11: qty 5

## 2023-02-11 MED ORDER — KETAMINE HCL 50 MG/5ML IJ SOSY
50.0000 mg | PREFILLED_SYRINGE | Freq: Once | INTRAMUSCULAR | Status: AC
  Administered 2023-02-11: 33 mg via INTRAVENOUS

## 2023-02-11 MED ORDER — SODIUM CHLORIDE 0.9 % IV BOLUS
1000.0000 mL | Freq: Once | INTRAVENOUS | Status: AC
  Administered 2023-02-11: 1000 mL via INTRAVENOUS

## 2023-02-11 MED ORDER — KETAMINE HCL 50 MG/5ML IJ SOSY
1.0000 mg/kg | PREFILLED_SYRINGE | Freq: Once | INTRAMUSCULAR | Status: AC
  Administered 2023-02-11: 83 mg via INTRAVENOUS
  Filled 2023-02-11: qty 10

## 2023-02-11 MED ORDER — DROPERIDOL 2.5 MG/ML IJ SOLN
5.0000 mg | Freq: Once | INTRAMUSCULAR | Status: AC
  Administered 2023-02-11: 5 mg via INTRAVENOUS
  Filled 2023-02-11: qty 2

## 2023-02-11 MED ORDER — CEFTRIAXONE SODIUM 2 G IJ SOLR
2.0000 g | Freq: Once | INTRAMUSCULAR | Status: AC
  Administered 2023-02-11: 2 g via INTRAVENOUS
  Filled 2023-02-11: qty 20

## 2023-02-11 MED ORDER — KETAMINE HCL 50 MG/5ML IJ SOSY: 1.0000 mg/kg | PREFILLED_SYRINGE | Freq: Once | INTRAMUSCULAR | Status: AC

## 2023-02-11 MED ORDER — DEXMEDETOMIDINE HCL IN NACL 400 MCG/100ML IV SOLN
0.0000 ug/kg/h | INTRAVENOUS | Status: DC
  Filled 2023-02-11: qty 100

## 2023-02-11 MED ORDER — IOHEXOL 350 MG/ML SOLN
75.0000 mL | Freq: Once | INTRAVENOUS | Status: AC | PRN
  Administered 2023-02-12: 75 mL via INTRAVENOUS

## 2023-02-11 MED ORDER — LORAZEPAM 2 MG/ML IJ SOLN
INTRAMUSCULAR | Status: AC
  Filled 2023-02-11: qty 1

## 2023-02-11 MED ORDER — KETAMINE HCL 50 MG/5ML IJ SOSY
17.0000 mg | PREFILLED_SYRINGE | Freq: Once | INTRAMUSCULAR | Status: AC
  Administered 2023-02-11: 17 mg via INTRAVENOUS

## 2023-02-11 MED ORDER — DROPERIDOL 2.5 MG/ML IJ SOLN: 2.5000 mg | Freq: Once | INTRAMUSCULAR | Status: DC

## 2023-02-11 MED ORDER — KETAMINE HCL 50 MG/5ML IJ SOSY: 1.0000 mg/kg | PREFILLED_SYRINGE | Freq: Once | INTRAMUSCULAR | Status: DC

## 2023-02-11 MED ORDER — DEXMEDETOMIDINE HCL IN NACL 400 MCG/100ML IV SOLN
0.2000 ug/kg/h | INTRAVENOUS | Status: DC
Start: 2023-02-11 — End: 2023-02-13
  Administered 2023-02-11: 0.2 ug/kg/h via INTRAVENOUS
  Administered 2023-02-12: 0.4 ug/kg/h via INTRAVENOUS
  Filled 2023-02-11 (×2): qty 100

## 2023-02-11 MED ORDER — SODIUM CHLORIDE 0.9 % IV SOLN
2.0000 g | Freq: Once | INTRAVENOUS | Status: AC
  Administered 2023-02-11: 2 g via INTRAVENOUS
  Filled 2023-02-11: qty 2000

## 2023-02-11 MED ORDER — LORAZEPAM 2 MG/ML IJ SOLN
2.0000 mg | Freq: Once | INTRAMUSCULAR | Status: AC
  Administered 2023-02-11: 2 mg via INTRAVENOUS
  Filled 2023-02-11: qty 1

## 2023-02-11 MED ORDER — DEXTROSE 5 % IV SOLN
10.0000 mg/kg | Freq: Three times a day (TID) | INTRAVENOUS | Status: DC
  Administered 2023-02-12 – 2023-02-13 (×5): 825 mg via INTRAVENOUS
  Filled 2023-02-11 (×9): qty 16.5
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
## 2023-02-11 NOTE — Progress Notes (Addendum)
ED Pharmacy Antibiotic Sign Off An antibiotic consult was received from an ED provider for vancomycin per pharmacy dosing for meningitis. A chart review was completed to assess appropriateness.  A single dose of ceftriaxone and ampicillin  was placed by the ED provider.   The following one time order(s) were placed per pharmacy consult:  vancomycin 1750 mg x 1 dose Acyclovir 10mg /kg (825 mg) x1 --NS at 125 ml/hr  Further antibiotic and/or antibiotic pharmacy consults should be ordered by the admitting provider if indicated.   Thank you for allowing pharmacy to be a part of this patient's care.   Delmar Landau, PharmD, BCPS 02/11/2023 9:38 PM ED Clinical Pharmacist -  [phone]

## 2023-02-11 NOTE — Progress Notes (Signed)
Patient not seen for substance abuse concerns.

## 2023-02-11 NOTE — ED Provider Notes (Signed)
[facility] Provider Note  CSN: [id number] Arrival date & time: 02/11/23 1936  Chief Complaint(s) Altered Mental Status  HPI Peter Kline is a 57 y.o. male with history of sickle cell here today for altered mental status.  Patient reportedly confused, unsteady in the street.  EMS activated him as a code stroke.  Patient unable to provide history.  History obtained entirely via EMS.   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 Authorizing Provider  ergocalciferol (VITAMIN D2) 1.25 MG (50000 UT) capsule Take 1 capsule (50,000 Units total) by mouth once a week. Patient taking differently: Take 1,000 Units by mouth daily. 03/22/19   Massie Maroon, FNP  folic acid (FOLVITE) 1 MG tablet Take 1 tablet (1 mg total) by mouth daily. 03/22/19   Massie Maroon, FNP  gabapentin (NEURONTIN) 300 MG capsule Take 1 capsule (300 mg total) by mouth 3 (three) times daily. Patient taking differently: Take 300 mg by mouth every other day. 03/22/19   Massie Maroon, FNP  omeprazole (PRILOSEC) 20 MG capsule Take 1 capsule (20 mg total) by  mouth 2 (two) times daily for 14 days. Patient taking differently: Take 20 mg by mouth daily. 03/22/19 12/25/20  Massie Maroon, FNP                                                                                                                                    Past Surgical History Past Surgical History:  Procedure Laterality Date   LAPAROSCOPIC GASTROTOMY W/ REPAIR OF ULCER     Family History Family History  Problem Relation Age of Onset   Diabetes Mother    Hypertension Mother     Social History Social History   Tobacco Use   Smoking status: Every Day    Current packs/day: 1.00    Types: Cigarettes   Smokeless tobacco: Never  Vaping Use   Vaping status: Never Used  Substance Use Topics   Alcohol use: Yes    Alcohol/week: 6.0 standard drinks of alcohol    Types:  6 Cans of beer per week    Comment: most days   Drug use: Yes    Types: Marijuana    Comment: occ   Allergies Patient has no known allergies.  Review of Systems Review of Systems  Physical Exam Vital Signs  I have reviewed the triage vital signs BP (!) 139/90   Pulse 96   Temp 98.4 F (36.9 C) (Oral)   Resp 20   Ht 6' (1.829 m)   Wt 82.6 kg   SpO2 98%   BMI 24.70 kg/m   Physical Exam Vitals reviewed.  Constitutional:      Appearance: He is ill-appearing.  HENT:     Head: Normocephalic and atraumatic.  Neck:     Comments: Patient does have a lump on the left side of his neck, warm Cardiovascular:     Rate and Rhythm: Tachycardia present.     Pulses: Normal pulses.  Pulmonary:     Effort: Pulmonary effort is normal.     Breath sounds: Normal breath sounds.  Abdominal:     General: Abdomen is flat.     Palpations: Abdomen is soft.  Musculoskeletal:        General: Normal range of motion.  Neurological:     Comments: Patient confused, not able to follow commands.  Is moving all extremities spontaneously.     ED Results and Treatments Labs (all labs ordered are listed, but only  abnormal results are displayed) Labs Reviewed  CBC - Abnormal; Notable for the following components:      Result Value   WBC 30.0 (*)    RBC 4.20 (*)    Hemoglobin 11.8 (*)    HCT 33.6 (*)    RDW 15.9 (*)    Platelets 127 (*)    All other components within normal limits  DIFFERENTIAL - Abnormal; Notable for the following components:   Neutro Abs 25.7 (*)    Monocytes Absolute 2.3 (*)    Abs Immature Granulocytes 0.20 (*)    All other components within normal limits  APTT - Abnormal; Notable for the following components:   aPTT 21 (*)    All other components within normal limits  I-STAT CHEM 8, ED - Abnormal; Notable for the following components:   Chloride 113 (*)    Glucose, Bld 107 (*)    TCO2 20 (*)    Hemoglobin 12.9 (*)    HCT 38.0 (*)    All other components within normal limits  CBG MONITORING, ED - Abnormal; Notable for the following components:   Glucose-Capillary 114 (*)    All other components within normal limits  CSF CULTURE W GRAM STAIN  CULTURE, BLOOD (ROUTINE X 2)  CULTURE, BLOOD (ROUTINE X 2)  ETHANOL  PROTIME-INR  RAPID URINE DRUG SCREEN, HOSP PERFORMED  URINALYSIS, ROUTINE W REFLEX MICROSCOPIC  CSF CELL COUNT WITH DIFFERENTIAL  CSF CELL COUNT WITH DIFFERENTIAL  PROTEIN AND GLUCOSE, CSF  HIV ANTIBODY (ROUTINE TESTING W REFLEX)  COMPREHENSIVE METABOLIC PANEL  Radiology CT HEAD CODE STROKE WO CONTRAST  Result Date: 02/11/2023 CLINICAL DATA:  Code stroke. Acute neurologic deficit. No further clinical history available. EXAM: CT HEAD WITHOUT CONTRAST TECHNIQUE: Contiguous axial images were obtained from the base of the skull through the vertex without intravenous contrast. RADIATION DOSE REDUCTION: This exam was performed according to the departmental dose-optimization program which includes automated exposure control, adjustment of the  mA and/or kV according to patient size and/or use of iterative reconstruction technique. COMPARISON:  None Available. FINDINGS: Motion degraded examination complicated by multiple streak artifacts from objects outside the field of view. Brain: There is no mass, hemorrhage or extra-axial collection. The size and configuration of the ventricles and extra-axial CSF spaces are normal. The brain parenchyma is normal, without evidence of acute or chronic infarction. Vascular: No abnormal hyperdensity of the major intracranial arteries or dural venous sinuses. No intracranial atherosclerosis. Skull: The visualized skull base, calvarium and extracranial soft tissues are normal. Sinuses/Orbits: No fluid levels or advanced mucosal thickening of the visualized paranasal sinuses. No mastoid or middle ear effusion. The orbits are normal. ASPECTS [facility] Stroke Program Early CT Score) - Ganglionic level infarction (caudate, lentiform nuclei, internal capsule, insula, M1-M3 cortex): 7 - Supraganglionic infarction (M4-M6 cortex): 3 Total score (0-10 with 10 being normal): 10 IMPRESSION: 1. Motion degraded examination complicated by multiple streak artifacts from objects outside the field of view. 2. No acute intracranial abnormality. 3. ASPECTS is 10. These results were communicated to Dr. Caryl Pina at 7:57 pm on 02/11/2023 by text page via the [employer] messaging system. Electronically Signed   By: Deatra Robinson M.D.   On: 02/11/2023 19:57    Pertinent labs & imaging results that were available during my care of the patient were reviewed by me and considered in my medical decision making (see MDM for details).  Medications Ordered in ED Medications  cefTRIAXone (ROCEPHIN) 2 g in sodium chloride 0.9 % 100 mL IVPB (2 g Intravenous New Bag/Given 02/11/23 2153)  vancomycin (VANCOREADY) IVPB 1750 mg/350 mL (has no administration in time range)  ampicillin (OMNIPEN) 2 g in sodium chloride 0.9 % 100 mL IVPB (has no administration  in time range)  droperidol (INAPSINE) 2.5 MG/ML injection 2.5 mg (has no administration in time range)  ketamine HCl 50 MG/5ML SOSY (  Canceled Entry 02/11/23 2153)  ketamine 50 mg in normal saline 5 mL (10 mg/mL) syringe (50 mg Intravenous Given 02/11/23 2115)  ketamine 50 mg in normal saline 5 mL (10 mg/mL) syringe (83 mg Intravenous Given 02/11/23 2126)  LORazepam (ATIVAN) injection 2 mg (2 mg Intravenous Given 02/11/23 2141)  ketamine 50 mg in normal saline 5 mL (10 mg/mL) syringe (17 mg Intravenous Given 02/11/23 2145)  ketamine 50 mg in normal saline 5 mL (10 mg/mL) syringe (33 mg Intravenous Given 02/11/23 2125)  Procedures .Critical Care  Performed by: Arletha Pili, DO Authorized by: Arletha Pili, DO   Critical care provider statement:    Critical care time (minutes):  55   Critical care was necessary to treat or prevent imminent or life-threatening deterioration of the following conditions:  Toxidrome and sepsis   Critical care was time spent personally by me on the following activities:  Development of treatment plan with patient or surrogate, discussions with consultants, evaluation of patient's response to treatment, examination of patient, ordering and review of laboratory studies, ordering and review of radiographic studies, ordering and performing treatments and interventions, pulse oximetry, re-evaluation of patient's condition and review of old charts   (including critical care time)  Medical Decision Making / ED Course   This patient presents to the ED for concern of altered mental status, this involves an extensive number of treatment options, and is a complaint that carries with it a high risk of complications and morbidity.  The differential diagnosis includes sepsis, meningitis, CVA, encephalitis.  MDM: 57 year old male here  today with altered mental status.  Patient was initially brought to the CT scanner, we obtained a limited CT head on the patient.  Both the neurologist and myself did not believe that this patient was likely having acute CVA, and concern shifted to sepsis versus toxidrome.  Looking at the patient's chart, it looks as though he attempted to be seen at [facility] on 26 September for question of an abscess on his neck.  He does have a small, fluctuant area that feels more like a simple cyst.  Will obtain imaging.  Patient very altered, is required multiple sedating medications.  Gave patient ketamine and attempted lumbar puncture on the patient, however with the positioning and was unable to successfully tap.  Will empirically treat for meningitis.  Out of concern for fluid overload, will provide the patient with 1 L of IV fluids and reassess.    Additional history obtained: -Additional history obtained from EMS -External records from outside source obtained and reviewed including: Chart review including previous notes, labs, imaging, consultation notes   Lab Tests: -I ordered, reviewed, and interpreted labs.   The pertinent results include: Leukocytosis Labs Reviewed  CBC - Abnormal; Notable for the following components:      Result Value   WBC 30.0 (*)    RBC 4.20 (*)    Hemoglobin 11.8 (*)    HCT 33.6 (*)    RDW 15.9 (*)    Platelets 127 (*)    All other components within normal limits  DIFFERENTIAL - Abnormal; Notable for the following components:   Neutro Abs 25.7 (*)    Monocytes Absolute 2.3 (*)    Abs Immature Granulocytes 0.20 (*)    All other components within normal limits  APTT - Abnormal; Notable for the following components:   aPTT 21 (*)    All other components within normal limits  I-STAT CHEM 8, ED - Abnormal; Notable for the following components:   Chloride 113 (*)    Glucose, Bld 107 (*)    TCO2 20 (*)    Hemoglobin 12.9 (*)    HCT 38.0 (*)    All other  components within normal limits  CBG MONITORING, ED - Abnormal; Notable for the following components:   Glucose-Capillary 114 (*)    All other components within normal limits  CSF CULTURE W GRAM STAIN  CULTURE, BLOOD (ROUTINE X 2)  CULTURE, BLOOD (ROUTINE X 2)  ETHANOL  PROTIME-INR  RAPID URINE DRUG SCREEN, HOSP PERFORMED  URINALYSIS, ROUTINE W REFLEX MICROSCOPIC  CSF CELL COUNT WITH DIFFERENTIAL  CSF CELL COUNT WITH DIFFERENTIAL  PROTEIN AND GLUCOSE, CSF  HIV ANTIBODY (ROUTINE TESTING W REFLEX)  COMPREHENSIVE METABOLIC PANEL      EKG sinus tachycardia  EKG Interpretation Date/Time:    Ventricular Rate:    PR Interval:    QRS Duration:    QT Interval:    QTC Calculation:   R Axis:      Text Interpretation:           Imaging Studies ordered: I ordered imaging studies including CT imaging of the head I independently visualized and interpreted imaging. I agree with the radiologist interpretation   Medicines ordered and prescription drug management: Meds ordered this encounter  Medications   cefTRIAXone (ROCEPHIN) 2 g in sodium chloride 0.9 % 100 mL IVPB    Order Specific Question:   Antibiotic Indication:    Answer:   Meningitis   ketamine 50 mg in normal saline 5 mL (10 mg/mL) syringe   ketamine HCl 50 MG/5ML SOSY    Darryll Capers J: cabinet override   ketamine 50 mg in normal saline 5 mL (10 mg/mL) syringe   DISCONTD: ketamine 50 mg in normal saline 5 mL (10 mg/mL) syringe   vancomycin (VANCOREADY) IVPB 1750 mg/350 mL    Order Specific Question:   Indication:    Answer:   Other Indication (list below)    Order Specific Question:   Other Indication:    Answer:   CNS infection   LORazepam (ATIVAN) injection 2 mg   ampicillin (OMNIPEN) 2 g in sodium chloride 0.9 % 100 mL IVPB    Order Specific Question:   Antibiotic Indication:    Answer:   Meningitis   droperidol (INAPSINE) 2.5 MG/ML injection 2.5 mg   ketamine HCl 50 MG/5ML SOSY    Stephenie Acres N:  cabinet override   ketamine 50 mg in normal saline 5 mL (10 mg/mL) syringe   ketamine 50 mg in normal saline 5 mL (10 mg/mL) syringe    -I have reviewed the patients home medicines and have made adjustments as needed  Critical interventions Sedation, treatment of probable sepsis   Cardiac Monitoring: The patient was maintained on a cardiac monitor.  I personally viewed and interpreted the cardiac monitored which showed an underlying rhythm of: Sinus tachycardia  Social Determinants of Health:  Factors impacting patients care include:    Reevaluation: After the interventions noted above, I reevaluated the patient and found that they have :stayed the same  Co morbidities that complicate the patient evaluation  Past Medical History:  Diagnosis Date   Shortness of breath    Sickle cell anemia (HCC)       Dispostion: Patient signed out to Dr. Eudelia Bunch pending CT imaging of the neck and admission.     Final Clinical Impression(s) / ED Diagnoses Final diagnoses:  None     @PCDICTATION @    Anders Simmonds T, DO 02/11/23 2238

## 2023-02-11 NOTE — ED Triage Notes (Signed)
Pt BIB [facility] d/t AMS, slurred speech and dysarthria.  Pt's friend called saying he was walking around altered.  The friend was a bad historian but know he has sickle Cell and non- compliant with meds.  CODE Stroke was called by EMS at [phone]

## 2023-02-12 ENCOUNTER — Emergency Department (HOSPITAL_COMMUNITY): Payer: 59

## 2023-02-12 ENCOUNTER — Inpatient Hospital Stay (HOSPITAL_COMMUNITY): Payer: 59

## 2023-02-12 DIAGNOSIS — G9340 Encephalopathy, unspecified: Secondary | ICD-10-CM | POA: Diagnosis not present

## 2023-02-12 DIAGNOSIS — I2699 Other pulmonary embolism without acute cor pulmonale: Secondary | ICD-10-CM | POA: Diagnosis not present

## 2023-02-12 DIAGNOSIS — E43 Unspecified severe protein-calorie malnutrition: Secondary | ICD-10-CM | POA: Diagnosis present

## 2023-02-12 DIAGNOSIS — A419 Sepsis, unspecified organism: Secondary | ICD-10-CM | POA: Diagnosis present

## 2023-02-12 DIAGNOSIS — E870 Hyperosmolality and hypernatremia: Secondary | ICD-10-CM | POA: Diagnosis not present

## 2023-02-12 DIAGNOSIS — I82443 Acute embolism and thrombosis of tibial vein, bilateral: Secondary | ICD-10-CM | POA: Diagnosis not present

## 2023-02-12 DIAGNOSIS — I82453 Acute embolism and thrombosis of peroneal vein, bilateral: Secondary | ICD-10-CM | POA: Diagnosis not present

## 2023-02-12 DIAGNOSIS — I10 Essential (primary) hypertension: Secondary | ICD-10-CM | POA: Diagnosis present

## 2023-02-12 DIAGNOSIS — Y848 Other medical procedures as the cause of abnormal reaction of the patient, or of later complication, without mention of misadventure at the time of the procedure: Secondary | ICD-10-CM | POA: Diagnosis not present

## 2023-02-12 DIAGNOSIS — D75838 Other thrombocytosis: Secondary | ICD-10-CM | POA: Diagnosis not present

## 2023-02-12 DIAGNOSIS — G0490 Encephalitis and encephalomyelitis, unspecified: Secondary | ICD-10-CM | POA: Diagnosis present

## 2023-02-12 DIAGNOSIS — Y95 Nosocomial condition: Secondary | ICD-10-CM | POA: Diagnosis not present

## 2023-02-12 DIAGNOSIS — J9601 Acute respiratory failure with hypoxia: Secondary | ICD-10-CM | POA: Diagnosis present

## 2023-02-12 DIAGNOSIS — I6340 Cerebral infarction due to embolism of unspecified cerebral artery: Secondary | ICD-10-CM | POA: Diagnosis present

## 2023-02-12 DIAGNOSIS — I739 Peripheral vascular disease, unspecified: Secondary | ICD-10-CM | POA: Diagnosis present

## 2023-02-12 DIAGNOSIS — G928 Other toxic encephalopathy: Secondary | ICD-10-CM | POA: Diagnosis present

## 2023-02-12 DIAGNOSIS — N4832 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: Secondary | ICD-10-CM | POA: Diagnosis present

## 2023-02-12 DIAGNOSIS — J1529 Pneumonia due to other staphylococcus: Secondary | ICD-10-CM | POA: Diagnosis not present

## 2023-02-12 DIAGNOSIS — I38 Endocarditis, valve unspecified: Secondary | ICD-10-CM | POA: Diagnosis not present

## 2023-02-12 DIAGNOSIS — Z1152 Encounter for screening for COVID-19: Secondary | ICD-10-CM | POA: Diagnosis not present

## 2023-02-12 DIAGNOSIS — J95851 Ventilator associated pneumonia: Secondary | ICD-10-CM | POA: Diagnosis not present

## 2023-02-12 DIAGNOSIS — I6389 Other cerebral infarction: Secondary | ICD-10-CM | POA: Diagnosis not present

## 2023-02-12 DIAGNOSIS — J159 Unspecified bacterial pneumonia: Secondary | ICD-10-CM | POA: Diagnosis not present

## 2023-02-12 DIAGNOSIS — J189 Pneumonia, unspecified organism: Secondary | ICD-10-CM

## 2023-02-12 DIAGNOSIS — F10131 Alcohol abuse with withdrawal delirium: Secondary | ICD-10-CM | POA: Diagnosis present

## 2023-02-12 DIAGNOSIS — R410 Disorientation, unspecified: Secondary | ICD-10-CM | POA: Diagnosis not present

## 2023-02-12 DIAGNOSIS — R569 Unspecified convulsions: Secondary | ICD-10-CM | POA: Diagnosis not present

## 2023-02-12 DIAGNOSIS — I639 Cerebral infarction, unspecified: Secondary | ICD-10-CM | POA: Diagnosis not present

## 2023-02-12 DIAGNOSIS — I6300 Cerebral infarction due to thrombosis of unspecified precerebral artery: Secondary | ICD-10-CM | POA: Diagnosis present

## 2023-02-12 DIAGNOSIS — D696 Thrombocytopenia, unspecified: Secondary | ICD-10-CM | POA: Diagnosis present

## 2023-02-12 DIAGNOSIS — I63443 Cerebral infarction due to embolism of bilateral cerebellar arteries: Secondary | ICD-10-CM | POA: Diagnosis present

## 2023-02-12 DIAGNOSIS — R4182 Altered mental status, unspecified: Secondary | ICD-10-CM | POA: Diagnosis present

## 2023-02-12 DIAGNOSIS — J690 Pneumonitis due to inhalation of food and vomit: Secondary | ICD-10-CM | POA: Diagnosis not present

## 2023-02-12 DIAGNOSIS — F1210 Cannabis abuse, uncomplicated: Secondary | ICD-10-CM | POA: Diagnosis present

## 2023-02-12 DIAGNOSIS — J9602 Acute respiratory failure with hypercapnia: Secondary | ICD-10-CM | POA: Diagnosis present

## 2023-02-12 DIAGNOSIS — R400 Somnolence: Secondary | ICD-10-CM | POA: Diagnosis not present

## 2023-02-12 DIAGNOSIS — E872 Acidosis, unspecified: Secondary | ICD-10-CM | POA: Diagnosis present

## 2023-02-12 DIAGNOSIS — I749 Embolism and thrombosis of unspecified artery: Secondary | ICD-10-CM | POA: Diagnosis not present

## 2023-02-12 DIAGNOSIS — G9341 Metabolic encephalopathy: Secondary | ICD-10-CM

## 2023-02-12 HISTORY — DX: Pneumonia, unspecified organism: J18.9

## 2023-02-12 LAB — CBC
HCT: 33.1 % — ABNORMAL LOW (ref 39.0–52.0)
Hemoglobin: 12.2 g/dL — ABNORMAL LOW (ref 13.0–17.0)
MCH: 29.3 pg (ref 26.0–34.0)
MCHC: 36.9 g/dL — ABNORMAL HIGH (ref 30.0–36.0)
MCV: 79.4 fL — ABNORMAL LOW (ref 80.0–100.0)
Platelets: 168 10*3/uL (ref 150–400)
RBC: 4.17 MIL/uL — ABNORMAL LOW (ref 4.22–5.81)
RDW: 15 % (ref 11.5–15.5)
WBC: 21.6 10*3/uL — ABNORMAL HIGH (ref 4.0–10.5)
nRBC: 0 % (ref 0.0–0.2)

## 2023-02-12 LAB — I-STAT ARTERIAL BLOOD GAS, ED
Acid-base deficit: 4 mmol/L — ABNORMAL HIGH (ref 0.0–2.0)
Bicarbonate: 23.3 mmol/L (ref 20.0–28.0)
Calcium, Ion: 1.28 mmol/L (ref 1.15–1.40)
HCT: 40 % (ref 39.0–52.0)
Hemoglobin: 13.6 g/dL (ref 13.0–17.0)
O2 Saturation: 100 %
Patient temperature: 97.7
Potassium: 3.2 mmol/L — ABNORMAL LOW (ref 3.5–5.1)
Sodium: 145 mmol/L (ref 135–145)
TCO2: 25 mmol/L (ref 22–32)
pCO2 arterial: 49.6 mm[Hg] — ABNORMAL HIGH (ref 32–48)
pH, Arterial: 7.277 — ABNORMAL LOW (ref 7.35–7.45)
pO2, Arterial: 276 mm[Hg] — ABNORMAL HIGH (ref 83–108)

## 2023-02-12 LAB — PHOSPHORUS
Phosphorus: 2.8 mg/dL (ref 2.5–4.6)
Phosphorus: 2.3 mg/dL — ABNORMAL LOW (ref 2.5–4.6)

## 2023-02-12 LAB — GLUCOSE, CAPILLARY
Glucose-Capillary: 113 mg/dL — ABNORMAL HIGH (ref 70–99)
Glucose-Capillary: 114 mg/dL — ABNORMAL HIGH (ref 70–99)
Glucose-Capillary: 124 mg/dL — ABNORMAL HIGH (ref 70–99)
Glucose-Capillary: 146 mg/dL — ABNORMAL HIGH (ref 70–99)

## 2023-02-12 LAB — CBG MONITORING, ED
Glucose-Capillary: 91 mg/dL (ref 70–99)
Glucose-Capillary: 122 mg/dL — ABNORMAL HIGH (ref 70–99)

## 2023-02-12 LAB — STREP PNEUMONIAE URINARY ANTIGEN: Strep Pneumo Urinary Antigen: NEGATIVE

## 2023-02-12 LAB — MRSA NEXT GEN BY PCR, NASAL: MRSA by PCR Next Gen: NOT DETECTED

## 2023-02-12 LAB — MAGNESIUM
Magnesium: 2 mg/dL (ref 1.7–2.4)
Magnesium: 2 mg/dL (ref 1.7–2.4)

## 2023-02-12 LAB — CREATININE, SERUM
Creatinine, Ser: 0.98 mg/dL (ref 0.61–1.24)
GFR, Estimated: >60 mL/min (ref >60)

## 2023-02-12 LAB — RESP PANEL BY RT-PCR (RSV, FLU A&B, COVID)  RVPGX2
Influenza A by PCR: NEGATIVE
Influenza B by PCR: NEGATIVE
Resp Syncytial Virus by PCR: NEGATIVE
SARS Coronavirus 2 by RT PCR: NEGATIVE

## 2023-02-12 LAB — HIV ANTIBODY (ROUTINE TESTING W REFLEX): HIV Screen 4th Generation wRfx: NONREACTIVE

## 2023-02-12 MED ORDER — PROPOFOL 1000 MG/100ML IV EMUL
INTRAVENOUS | Status: AC
  Administered 2023-02-12: 20 ug/kg/min via INTRAVENOUS
  Filled 2023-02-12: qty 100

## 2023-02-12 MED ORDER — VANCOMYCIN HCL IN DEXTROSE 1-5 GM/200ML-% IV SOLN
1000.0000 mg | Freq: Three times a day (TID) | INTRAVENOUS | Status: DC
  Administered 2023-02-12 – 2023-02-13 (×4): 1000 mg via INTRAVENOUS
  Filled 2023-02-12 (×4): qty 200

## 2023-02-12 MED ORDER — PROSOURCE TF20 ENFIT COMPATIBL EN LIQD
60.0000 mL | Freq: Two times a day (BID) | ENTERAL | Status: DC
  Administered 2023-02-12 – 2023-02-26 (×28): 60 mL
  Filled 2023-02-12 (×28): qty 60

## 2023-02-12 MED ORDER — CHLORHEXIDINE GLUCONATE CLOTH 2 % EX PADS
6.0000 | MEDICATED_PAD | Freq: Every day | CUTANEOUS | Status: DC
  Administered 2023-02-12 – 2023-02-21 (×10): 6 via TOPICAL

## 2023-02-12 MED ORDER — INSULIN ASPART 100 UNIT/ML IJ SOLN
0.0000 [IU] | INTRAMUSCULAR | Status: DC
  Administered 2023-02-12 (×2): 1 [IU] via SUBCUTANEOUS
  Administered 2023-02-13: 2 [IU] via SUBCUTANEOUS
  Administered 2023-02-13 (×3): 1 [IU] via SUBCUTANEOUS
  Administered 2023-02-13: 2 [IU] via SUBCUTANEOUS
  Administered 2023-02-13 – 2023-02-20 (×10): 1 [IU] via SUBCUTANEOUS
  Administered 2023-02-20: 2 [IU] via SUBCUTANEOUS
  Administered 2023-02-20: 1 [IU] via SUBCUTANEOUS
  Administered 2023-02-21: 2 [IU] via SUBCUTANEOUS
  Administered 2023-02-21 (×2): 1 [IU] via SUBCUTANEOUS
  Administered 2023-02-21: 2 [IU] via SUBCUTANEOUS
  Administered 2023-02-21 – 2023-02-22 (×4): 1 [IU] via SUBCUTANEOUS
  Administered 2023-02-22: 2 [IU] via SUBCUTANEOUS
  Administered 2023-02-22 – 2023-02-24 (×8): 1 [IU] via SUBCUTANEOUS
  Administered 2023-02-24: 2 [IU] via SUBCUTANEOUS
  Administered 2023-02-24 – 2023-02-26 (×7): 1 [IU] via SUBCUTANEOUS

## 2023-02-12 MED ORDER — LACTATED RINGERS IV BOLUS
1000.0000 mL | Freq: Once | INTRAVENOUS | Status: AC
  Administered 2023-02-12: 1000 mL via INTRAVENOUS

## 2023-02-12 MED ORDER — IPRATROPIUM-ALBUTEROL 0.5-2.5 (3) MG/3ML IN SOLN
3.0000 mL | Freq: Four times a day (QID) | RESPIRATORY_TRACT | Status: DC
  Administered 2023-02-12 – 2023-02-19 (×30): 3 mL via RESPIRATORY_TRACT
  Filled 2023-02-12 (×29): qty 3

## 2023-02-12 MED ORDER — ADULT MULTIVITAMIN W/MINERALS CH
1.0000 | ORAL_TABLET | Freq: Every day | ORAL | Status: DC
  Administered 2023-02-12 – 2023-02-13 (×2): 1
  Filled 2023-02-12 (×2): qty 1

## 2023-02-12 MED ORDER — FAMOTIDINE 20 MG PO TABS
20.0000 mg | ORAL_TABLET | Freq: Two times a day (BID) | ORAL | Status: DC
  Administered 2023-02-12 – 2023-02-19 (×16): 20 mg
  Filled 2023-02-12 (×16): qty 1

## 2023-02-12 MED ORDER — ETOMIDATE 2 MG/ML IV SOLN
20.0000 mg | Freq: Once | INTRAVENOUS | Status: AC
  Administered 2023-02-12: 20 mg via INTRAVENOUS
  Filled 2023-02-12: qty 10

## 2023-02-12 MED ORDER — FENTANYL CITRATE PF 50 MCG/ML IJ SOSY
50.0000 ug | PREFILLED_SYRINGE | Freq: Once | INTRAMUSCULAR | Status: DC
  Filled 2023-02-12: qty 1

## 2023-02-12 MED ORDER — IPRATROPIUM-ALBUTEROL 0.5-2.5 (3) MG/3ML IN SOLN
3.0000 mL | Freq: Four times a day (QID) | RESPIRATORY_TRACT | Status: DC | PRN
  Administered 2023-02-22 – 2023-02-26 (×4): 3 mL via RESPIRATORY_TRACT
  Filled 2023-02-12 (×7): qty 3

## 2023-02-12 MED ORDER — ROCURONIUM BROMIDE 10 MG/ML (PF) SYRINGE
100.0000 mg | PREFILLED_SYRINGE | Freq: Once | INTRAVENOUS | Status: AC
  Administered 2023-02-12: 100 mg via INTRAVENOUS
  Filled 2023-02-12: qty 10

## 2023-02-12 MED ORDER — SODIUM CHLORIDE 0.9 % IV SOLN
2.0000 g | INTRAVENOUS | Status: DC
  Administered 2023-02-12 – 2023-02-13 (×7): 2 g via INTRAVENOUS
  Filled 2023-02-12 (×8): qty 2000

## 2023-02-12 MED ORDER — SODIUM CHLORIDE 0.9 % IV SOLN: INTRAVENOUS | Status: DC

## 2023-02-12 MED ORDER — LIDOCAINE HCL (PF) 1 % IJ SOLN
5.0000 mL | Freq: Once | INTRAMUSCULAR | Status: AC
  Administered 2023-02-12: 5 mL via INTRADERMAL

## 2023-02-12 MED ORDER — ALBUMIN HUMAN 5 % IV SOLN
12.5000 g | Freq: Once | INTRAVENOUS | Status: AC
  Administered 2023-02-12: 12.5 g via INTRAVENOUS
  Filled 2023-02-12: qty 250

## 2023-02-12 MED ORDER — OSMOLITE 1.5 CAL PO LIQD
1000.0000 mL | ORAL | Status: DC
  Administered 2023-02-12 – 2023-02-19 (×5): 1000 mL

## 2023-02-12 MED ORDER — MIDAZOLAM HCL 2 MG/2ML IJ SOLN
1.0000 mg | INTRAMUSCULAR | Status: DC | PRN
  Administered 2023-02-12 – 2023-02-13 (×9): 2 mg via INTRAVENOUS
  Administered 2023-02-13: 1 mg via INTRAVENOUS
  Administered 2023-02-13 – 2023-02-16 (×10): 2 mg via INTRAVENOUS
  Filled 2023-02-12 (×23): qty 2

## 2023-02-12 MED ORDER — PROPOFOL 1000 MG/100ML IV EMUL
5.0000 ug/kg/min | INTRAVENOUS | Status: DC
  Administered 2023-02-13 (×2): 30 ug/kg/min via INTRAVENOUS
  Administered 2023-02-13: 35 ug/kg/min via INTRAVENOUS
  Administered 2023-02-14: 40 ug/kg/min via INTRAVENOUS
  Administered 2023-02-14: 35 ug/kg/min via INTRAVENOUS
  Administered 2023-02-14: 40 ug/kg/min via INTRAVENOUS
  Administered 2023-02-15 (×2): 20 ug/kg/min via INTRAVENOUS
  Administered 2023-02-16: 35 ug/kg/min via INTRAVENOUS
  Administered 2023-02-16: 20 ug/kg/min via INTRAVENOUS
  Administered 2023-02-16: 40 ug/kg/min via INTRAVENOUS
  Administered 2023-02-17 (×2): 30 ug/kg/min via INTRAVENOUS
  Administered 2023-02-17 – 2023-02-18 (×4): 40 ug/kg/min via INTRAVENOUS
  Administered 2023-02-18 (×2): 30 ug/kg/min via INTRAVENOUS
  Administered 2023-02-19: 40 ug/kg/min via INTRAVENOUS
  Administered 2023-02-19: 30 ug/kg/min via INTRAVENOUS
  Filled 2023-02-12 (×14): qty 100
  Filled 2023-02-12: qty 200
  Filled 2023-02-12: qty 100
  Filled 2023-02-12: qty 200
  Filled 2023-02-12 (×6): qty 100

## 2023-02-12 MED ORDER — DOCUSATE SODIUM 50 MG/5ML PO LIQD
100.0000 mg | Freq: Two times a day (BID) | ORAL | Status: DC | PRN
  Administered 2023-02-14: 100 mg
  Filled 2023-02-12: qty 10

## 2023-02-12 MED ORDER — SODIUM CHLORIDE 0.9 % IV SOLN: 250.0000 mL | INTRAVENOUS | Status: DC

## 2023-02-12 MED ORDER — POLYETHYLENE GLYCOL 3350 17 G PO PACK: 17.0000 g | PACK | Freq: Every day | ORAL | Status: DC | PRN

## 2023-02-12 MED ORDER — FENTANYL BOLUS VIA INFUSION
50.0000 ug | INTRAVENOUS | Status: DC | PRN
Start: 2023-02-12 — End: 2023-02-19
  Administered 2023-02-12 (×5): 100 ug via INTRAVENOUS
  Administered 2023-02-12 – 2023-02-13 (×2): 50 ug via INTRAVENOUS
  Administered 2023-02-13: 100 ug via INTRAVENOUS
  Administered 2023-02-13: 50 ug via INTRAVENOUS
  Administered 2023-02-14 – 2023-02-15 (×6): 100 ug via INTRAVENOUS
  Administered 2023-02-19 (×2): 50 ug via INTRAVENOUS

## 2023-02-12 MED ORDER — DEXAMETHASONE SODIUM PHOSPHATE 10 MG/ML IJ SOLN
10.0000 mg | Freq: Four times a day (QID) | INTRAMUSCULAR | Status: DC
  Administered 2023-02-12 – 2023-02-13 (×4): 10 mg via INTRAVENOUS
  Filled 2023-02-12 (×4): qty 1

## 2023-02-12 MED ORDER — FENTANYL 2500MCG IN NS 250ML (10MCG/ML) PREMIX INFUSION
0.0000 ug/h | INTRAVENOUS | Status: DC
  Administered 2023-02-12: 50 ug/h via INTRAVENOUS
  Administered 2023-02-12: 200 ug/h via INTRAVENOUS
  Administered 2023-02-12: 150 ug/h via INTRAVENOUS
  Administered 2023-02-13: 175 ug/h via INTRAVENOUS
  Administered 2023-02-13: 250 ug/h via INTRAVENOUS
  Administered 2023-02-13: 200 ug/h via INTRAVENOUS
  Administered 2023-02-14: 250 ug/h via INTRAVENOUS
  Administered 2023-02-14 (×2): 200 ug/h via INTRAVENOUS
  Administered 2023-02-15: 125 ug/h via INTRAVENOUS
  Administered 2023-02-16: 150 ug/h via INTRAVENOUS
  Administered 2023-02-16 – 2023-02-18 (×4): 100 ug/h via INTRAVENOUS
  Filled 2023-02-12 (×15): qty 250

## 2023-02-12 MED ORDER — SODIUM CHLORIDE 0.9 % IV SOLN
2.0000 g | Freq: Two times a day (BID) | INTRAVENOUS | Status: DC
  Administered 2023-02-12 – 2023-02-13 (×3): 2 g via INTRAVENOUS
  Filled 2023-02-12 (×3): qty 20

## 2023-02-12 MED ORDER — HEPARIN SODIUM (PORCINE) 5000 UNIT/ML IJ SOLN
5000.0000 [IU] | Freq: Three times a day (TID) | INTRAMUSCULAR | Status: DC
  Administered 2023-02-12: 5000 [IU] via SUBCUTANEOUS
  Filled 2023-02-12: qty 1

## 2023-02-12 MED ORDER — NOREPINEPHRINE 4 MG/250ML-% IV SOLN: 2.0000 ug/min | INTRAVENOUS | Status: DC

## 2023-02-12 MED ORDER — THIAMINE MONONITRATE 100 MG PO TABS
100.0000 mg | ORAL_TABLET | Freq: Every day | ORAL | Status: DC
  Administered 2023-02-12: 100 mg
  Filled 2023-02-12: qty 1

## 2023-02-12 MED ORDER — NOREPINEPHRINE 4 MG/250ML-% IV SOLN
INTRAVENOUS | Status: AC
  Filled 2023-02-12: qty 250

## 2023-02-12 NOTE — ED Notes (Signed)
Pt care taken, adjusted his sedation medication, pt was biting the tube.

## 2023-02-12 NOTE — Progress Notes (Signed)
Transported pt from ED to CT on vent with no incident

## 2023-02-12 NOTE — H&P (Signed)
NAME:  Peter Kline, MRN:  [id number], DOB:  April 27, 1965, LOS: 0 ADMISSION DATE:  02/11/2023, CONSULTATION DATE:  02/12/23 REFERRING MD:  EDP, CHIEF COMPLAINT:  acute resp failure   History of Present Illness:  57 yo male who's pmh and hpi are severely limited 2/2 acute encephalopathy and resp failure now on ventilator and sedation. Pt was reportedly found wandering the streets of [place] altered. Pt was brought in by EMS with slurred speech and confusion. He was orginally evaluated as code stroke that was negative. Pt's mental status failed to improve and agitation was difficult to allow for evaluation and work up so he was subsequently intubated for airway protection.   Imaging noted multifocal pna, leukocytosis and metabolic acidosis  Ccm was asked to admit to ICU  Pertinent  Medical History  H/o sickle cell neuropathy  Significant Hospital Events: Including procedures, antibiotic start and stop dates in addition to other pertinent events   Admitted to [facility] 10/30  Interim History / Subjective:    Objective   Blood pressure 106/80, pulse 69, temperature (!) 97.4 F (36.3 C), resp. rate 19, height 6' (1.829 m), weight 82.6 kg, SpO2 100%.    Vent Mode: PRVC FiO2 (%):  [50 %-100 %] 50 % Set Rate:  [18 bmp] 18 bmp Vt Set:  [500 mL] 500 mL PEEP:  [5 cmH20] 5 cmH20   Intake/Output Summary (Last 24 hours) at 02/12/2023 0545 Last data filed at 02/12/2023 0231 Gross per 24 hour  Intake 1766.7 ml  Output --  Net 1766.7 ml   Filed Weights   02/11/23 2001  Weight: 82.6 kg    Examination: General: sedated unresponsive on vent HENT: ncat perrla pinpoint Lungs: ctab Cardiovascular: rrr Abdomen: soft nt/nd Extremities: no c/c/e Neuro: sedated, previously non focal GU: deferred  Resolved Hospital Problem list     Assessment & Plan:  Acute encephalopathy of undetermined etiology Sepsis, undetermined etiology Leukocytosis Acute hypoxic resp failure Multilobar  pna Acute delirium s/p ett chronic normocytic anemia thrombocytopenia -cont empiric abx -f/u cx, viral panel -cth negative for acute change -unable to obtain LP with agitation and ams per EDP and started on empiric abx prior to CCM consult -consider repeat attempts vs empiric coverage for > meninigitis -lactate surprisingly normal.  -titrate vent, sat/sbt when able -cont nebs while intubated -uds negative with exception of THC -Ammonia negative -may warrant mri +/- eeg if unable to arouse  Best Practice (right click and "Reselect all SmartList Selections" daily)   Diet/type: NPO DVT prophylaxis: prophylactic heparin  GI prophylaxis: H2B Lines: N/A Foley:  N/A Code Status:  full code Last date of multidisciplinary goals of care discussion [pending]  Labs   CBC: Recent Labs  Lab 02/11/23 1944 02/11/23 2007 02/12/23 0132  WBC  --  30.0*  --   NEUTROABS  --  25.7*  --   HGB 12.9* 11.8* 13.6  HCT 38.0* 33.6* 40.0  MCV  --  80.0  --   PLT  --  127*  --     Basic Metabolic Panel: Recent Labs  Lab 02/11/23 1944 02/11/23 2145 02/12/23 0132  NA 144 142 145  K 3.6 4.2 3.2*  CL 113* 111  --   CO2  --  20*  --   GLUCOSE 107* 105*  --   BUN 19 16  --   CREATININE 1.00 1.07  --   CALCIUM  --  9.3  --    GFR: Estimated Creatinine Clearance: 82.6 mL/min (by C-G  formula based on SCr of 1.07 mg/dL). Recent Labs  Lab 02/11/23 2007 02/11/23 2313  WBC 30.0*  --   LATICACIDVEN  --  0.8    Liver Function Tests: Recent Labs  Lab 02/11/23 2145  AST 46*  ALT 28  ALKPHOS 99  BILITOT 1.9*  PROT 8.1  ALBUMIN 3.8   No results for input(s): "LIPASE", "AMYLASE" in the last 168 hours. Recent Labs  Lab 02/11/23 2313  AMMONIA 29    ABG    Component Value Date/Time   PHART 7.277 (L) 02/12/2023 0132   PCO2ART 49.6 (H) 02/12/2023 0132   PO2ART 276 (H) 02/12/2023 0132   HCO3 23.3 02/12/2023 0132   TCO2 25 02/12/2023 0132   ACIDBASEDEF 4.0 (H) 02/12/2023 0132    O2SAT 100 02/12/2023 0132     Coagulation Profile: Recent Labs  Lab 02/11/23 2110  INR 1.0    Cardiac Enzymes: No results for input(s): "CKTOTAL", "CKMB", "CKMBINDEX", "TROPONINI" in the last 168 hours.  HbA1C: Hgb A1c MFr Bld  Date/Time Value Ref Range Status  10/01/2013 01:29 PM 4.4 <5.7 % Final    Comment:                                                                           According to the ADA Clinical Practice Recommendations for 2011, when HbA1c is used as a screening test:     >=6.5%   Diagnostic of Diabetes Mellitus            (if abnormal result is confirmed)   5.7-6.4%   Increased risk of developing Diabetes Mellitus   References:Diagnosis and Classification of Diabetes Mellitus,Diabetes Care,2011,34(Suppl 1):S62-S69 and Standards of Medical Care in         Diabetes - 2011,Diabetes Care,2011,34 (Suppl 1):S11-S61.      CBG: Recent Labs  Lab 02/11/23 1939  GLUCAP 114*    Review of Systems:   Unobtainable 2/2 ett  Past Medical History:  He,  has a past medical history of Shortness of breath and Sickle cell anemia (HCC).   Surgical History:   Past Surgical History:  Procedure Laterality Date   LAPAROSCOPIC GASTROTOMY W/ REPAIR OF ULCER       Social History:   reports that he has been smoking cigarettes. He has never used smokeless tobacco. He reports current alcohol use of about 6.0 standard drinks of alcohol per week. He reports current drug use. Drug: Marijuana.   Family History:  His family history includes Diabetes in his mother; Hypertension in his mother.   Allergies No Known Allergies   Home Medications  Prior to Admission medications   Medication Sig Start Date End Date Taking? Authorizing Provider  ergocalciferol (VITAMIN D2) 1.25 MG (50000 UT) capsule Take 1 capsule (50,000 Units total) by mouth once a week. Patient taking differently: Take 1,000 Units by mouth daily. 03/22/19   Massie Maroon, FNP  folic acid (FOLVITE) 1 MG  tablet Take 1 tablet (1 mg total) by mouth daily. 03/22/19   Massie Maroon, FNP  gabapentin (NEURONTIN) 300 MG capsule Take 1 capsule (300 mg total) by mouth 3 (three) times daily. Patient taking differently: Take 300 mg by mouth every other day. 03/22/19   Hart Rochester,  Rosalene Billings, FNP  omeprazole (PRILOSEC) 20 MG capsule Take 1 capsule (20 mg total) by mouth 2 (two) times daily for 14 days. Patient taking differently: Take 20 mg by mouth daily. 03/22/19 12/25/20  Massie Maroon, FNP     Critical care time: 

## 2023-02-12 NOTE — ED Provider Notes (Signed)
I assumed care of this patient from previous provider.  Please see their note for further details of history, exam, and MDM.   Briefly patient is a 57 y.o. male who presented as a code stroke for altered mental status and gait instability.  Noted to have significant leukocytosis at 30,000.  LP was previously attempted without success despite extensive sedation efforts.  He was treated empirically for meningitis.  CT head was negative.  Currently awaiting CT imaging of the neck due to lump noted on exam.  Plan is to follow-up rest of the labs and imaging and admit.  CTs were delayed due to patient's altered mental status and not being able to cooperate, ripping off IVs and Foley catheter.  Patient required numerous staff to restrain patient and keep him in bed.  He altered and we were unable to redirect.  He required deep sedation and intubation to expedite care as well as his and staff safety.   Ultimately, patient was noted to have evidence of pneumonia.  Soft tissue neck without deep tissue abscess. UDS + only for THC.  Consulted ICU for admission for further workup and management.  .Critical Care  Performed by: Nira Conn, MD Authorized by: Nira Conn, MD   Critical care provider statement:    Critical care time (minutes):  45   Critical care time was exclusive of:  Separately billable procedures and treating other patients   Critical care was necessary to treat or prevent imminent or life-threatening deterioration of the following conditions:  CNS failure or compromise and sepsis   Critical care was time spent personally by me on the following activities:  Development of treatment plan with patient or surrogate, discussions with consultants, evaluation of patient's response to treatment, examination of patient, obtaining history from patient or surrogate, review of old charts, re-evaluation of patient's condition, pulse oximetry, ordering and review of radiographic  studies, ordering and review of laboratory studies and ordering and performing treatments and interventions   Care discussed with: admitting provider          Nira Conn, MD 02/12/23 [phone]

## 2023-02-12 NOTE — Progress Notes (Signed)
Pharmacy Antibiotic Note  Peter Kline is a 57 y.o. male admitted on 02/11/2023 with altered mental status with concerns for pneumonia/possible meningitis.  Pharmacy has been consulted for vancomycin dosing.  -WBC 30, sCr 1.07, lactate 0.8, afebrile -On CTX 2g IV every 12h, ampicillin 2g IV every 4h, acyclovir 10mg /kg IV every 8h, Vanc 1750mg  IV x1 -blood, sputum cultures collected, CSF culture canceled  Plan: -Vancomycin 1000mg  IV every 8h (aim for trough levels between 15-20) -Check levels at steady state -Monitor renal function -Order MRSA PCR -Follow up signs of clinical improvement, LOT, de-escalation of antibiotics   Height: 6' (182.9 cm) Weight: 82.6 kg (182 lb 1.6 oz) IBW/kg (Calculated) : 77.6  Temp (24hrs), Avg:97.8 F (36.6 C), Min:97.4 F (36.3 C), Max:98.4 F (36.9 C)  Recent Labs  Lab 02/11/23 1944 02/11/23 2007 02/11/23 2145 02/11/23 2313  WBC  --  30.0*  --   --   CREATININE 1.00  --  1.07  --   LATICACIDVEN  --   --   --  0.8    Estimated Creatinine Clearance: 82.6 mL/min (by C-G formula based on SCr of 1.07 mg/dL).    No Known Allergies  Antimicrobials this admission: Acyclovir 10/30 >>  Ampicillin 10/30 >>  Ceftriaxone 10/30 >> Vancomycin 10/30 >>  Microbiology results: 10/30 BCx:  10/30 Sputum:   10/30 MRSA PCR:   Thank you for allowing pharmacy to be a part of this patient's care.  Arabella Merles, PharmD. Clinical Pharmacist 02/12/2023 7:00 AM

## 2023-02-12 NOTE — Progress Notes (Signed)
Initial Nutrition Assessment  DOCUMENTATION CODES:   Severe malnutrition in context of social or environmental circumstances  INTERVENTION:   Initiate tube feeding via OG tube: Osmolite 1.5 at 25 ml/h and increase by 10 ml every 8 hours to goal rate of 55 ml/hr  (1320 ml per day) Prosource TF20 60 ml BID  Provides 2140 kcal, 122 gm protein, 1003 ml free water daily  MVI daily  Thiamine 100 mg daily x 7 days  Monitor magnesium and phosphorus every 12 hours x 4 occurrences, MD to replete as needed, as pt is at risk for refeeding syndrome given pt meets criteria for severe malnutrition.   NUTRITION DIAGNOSIS:   Severe Malnutrition related to social / environmental circumstances as evidenced by severe fat depletion, severe muscle depletion.  GOAL:   Patient will meet greater than or equal to 90% of their needs  MONITOR:   TF tolerance  REASON FOR ASSESSMENT:   Consult Enteral/tube feeding initiation and management  ASSESSMENT:   Pt with PMH of sickle cell, chronic pain, retinal hemorrhage, tobacco use, MJ use, vitamin D deficiency, and GERD admitted with acute encephalopathy, found wandering [place] altered. Pt intubated.   Pt discussed during ICU rounds and with RN and MD.  Per MD ok to start TF after LP in IR.   Pt meets criteria for severe malnutrition.   Patient is currently intubated on ventilator support MV: 10.2 L/min Temp (24hrs), Avg:98.2 F (36.8 C), Min:97.4 F (36.3 C), Max:100.5 F (38.1 C)   Admit weight: 82.6 kg   Current weight:  Last Weight  Most recent update: 02/11/2023  8:01 PM    Weight  82.6 kg (182 lb 1.6 oz)             Drains/Lines: 18 F OG tube; tip within stomach per xray    Nutritionally Relevant Medications: Scheduled Meds:  dexamethasone (DECADRON) injection  10 mg Intravenous Q6H   famotidine  20 mg Per Tube BID   insulin aspart  0-9 Units Subcutaneous Q4H   Continuous Infusions:  sodium chloride     acyclovir  Stopped (02/12/23 0748)   ampicillin (OMNIPEN) IV Stopped (02/12/23 3016)   cefTRIAXone (ROCEPHIN)  IV 2 g (02/12/23 1033)   dexmedetomidine (PRECEDEX) IV infusion 0.5 mcg/kg/hr (02/12/23 0814)   fentaNYL infusion INTRAVENOUS 160 mcg/hr (02/12/23 0814)   vancomycin 1,000 mg (02/12/23 0856)    Labs Reviewed: K 3.2 CBG ranges from 91-122 mg/dL over the last 24 hours    NUTRITION - FOCUSED PHYSICAL EXAM:  Flowsheet Row Most Recent Value  Orbital Region Moderate depletion  Upper Arm Region Severe depletion  Thoracic and Lumbar Region Severe depletion  Buccal Region Unable to assess  Temple Region Severe depletion  Clavicle Bone Region Severe depletion  Clavicle and Acromion Bone Region Severe depletion  Scapular Bone Region Unable to assess  Dorsal Hand Unable to assess  Patellar Region Moderate depletion  Anterior Thigh Region Moderate depletion  Posterior Calf Region Moderate depletion  Edema (RD Assessment) None  Hair Reviewed  Eyes Unable to assess  Mouth Unable to assess  Skin Reviewed  Nails Unable to assess       Diet Order:   Diet Order             Diet NPO time specified  Diet effective now                   EDUCATION NEEDS:   Not appropriate for education at this time  Skin:  Skin Assessment: Reviewed RN Assessment  Last BM:  unknown  Height:   Ht Readings from Last 1 Encounters:  02/11/23 6' (1.829 m)    Weight:   Wt Readings from Last 1 Encounters:  02/11/23 82.6 kg   BMI:  Body mass index is 24.7 kg/m.  Estimated Nutritional Needs:   Kcal:  2100-2400  Protein:  120-140 grams  Fluid:  >2 L/day  Cammy Copa., RD, LDN, CNSC See AMiON for contact information

## 2023-02-12 NOTE — Progress Notes (Signed)
Fluoro guided LP performed at L2-3, L3-4, and L5-S1, unable to obtain any CSF.  Ordering NP notified.  Patient transferred back to 4N.   Please call radiology/IR for questions and concerns.   Willette Brace PA-C 02/12/2023 2:37 PM

## 2023-02-12 NOTE — Progress Notes (Signed)
Intubation Procedure Note  Peter Kline  [id number]  04-15-1966  Date:02/12/23  Time:1:04 AM   Provider Performing:Tekla Malachowski A Octave Montrose    Procedure: Assisted with Intubation  Indication(s) Rt called for intubation Time Out Verified patient identification, verified procedure, site/side was marked, verified correct patient position, special equipment/implants available, medications/allergies/relevant history reviewed, required imaging and test results available. Sterile Technique Usual hand hygeine, masks, and gloves were used   Procedure Description Patient positioned in bed supine.Patient was intubated with endotracheal tube using Glidescope.  View was Grade 1 full glottis .  Number of attempts was 1.  Colorimetric CO2 detector was consistent with tracheal placement.   Complications/Tolerance None; patient tolerated the procedure well. Chest X-ray is ordered to verify placement. Abg to follow in hour

## 2023-02-12 NOTE — Progress Notes (Addendum)
NAME:  Peter Kline, MRN:  [id number], DOB:  1965/12/25, LOS: 0 ADMISSION DATE:  02/11/2023, CONSULTATION DATE:  02/12/23 REFERRING MD:  EDP, CHIEF COMPLAINT:  acute resp failure   History of Present Illness:  57 yo male who's hpi is severely limited 2/2 acute encephalopathy and resp failure now on ventilator and sedation. Pt was reportedly found wandering the streets of [place] altered. Pt was brought in by EMS with slurred speech and confusion. He was orginally evaluated as code stroke that was negative. Pt's mental status failed to improve and agitation was difficult to allow for evaluation and work up so he was subsequently intubated for airway protection.  PMH sickle cell, chronic pain, retinal hemorrhage, tobacco use, MJ use, Vit D def, Priapism, GERD   Imaging noted multifocal pna, leukocytosis and metabolic acidosis  Ccm was asked to admit to ICU  Pertinent  Medical History  Sickle cell, chronic pain, retinal hemorrhage, tobacco use, MJ use, Vit D def, Priapism, GERD  Significant Hospital Events: Including procedures, antibiotic start and stop dates in addition to other pertinent events   Admitted to [facility] 10/30  Interim History / Subjective:    Objective   Blood pressure 106/71, pulse 78, temperature (!) 100.5 F (38.1 C), resp. rate 15, height 6' (1.829 m), weight 82.6 kg, SpO2 100%.    Vent Mode: PRVC FiO2 (%):  [40 %-100 %] 40 % Set Rate:  [18 bmp] 18 bmp Vt Set:  [500 mL] 500 mL PEEP:  [5 cmH20] 5 cmH20   Intake/Output Summary (Last 24 hours) at 02/12/2023 0948 Last data filed at 02/12/2023 0806 Gross per 24 hour  Intake 1766.7 ml  Output 1250 ml  Net 516.7 ml   Filed Weights   02/11/23 2001  Weight: 82.6 kg    Examination: General: sedated unresponsive on vent HENT: + nuchal rigidity. ETT secure  Lungs: Mechanically ventilated, L sided coarse sounds  Cardiovascular:  rr cap refill  < 3sec  Abdomen:  soft ndnt  Extremities: no acute joint deformity   Neuro: disconjugate upward gaze. Anisocoria.  GU: deferred   Resolved Hospital Problem list     Assessment & Plan:   Acute encephalopathy R/o meningitis  Sepsis: multilobar PNA and concern for meningitis  Acute hypoxic resp failure Multilobar PNA  Substance use disorder Tobacco use  Sickle cell dz (associated retinopathy, chronic pain, priapism, splenic sequestration)  Chronic anemia  P -will try to obtain LP -empiric meningitis coverage  -May need MRI at some point. Story doesn't sounds like it aligns w sz so dont think we need an eeg right now  -1L LR bolus + mIVF while on acyclovir  -f/u micro data  -Fent and precedex for sedation -VAP, pulm hygiene -not weaning sedation or vent while we are trying to obtain LP -- apparently gets pretty combative    Best Practice (right click and "Reselect all SmartList Selections" daily)   Diet/type: NPO DVT prophylaxis: SCD - -will add back chem vte ppx after LP  GI prophylaxis: H2B Lines: N/A Foley:  N/A Code Status:  full code Last date of multidisciplinary goals of care discussion [pending] Multiple attempts at reaching family unsuccessful so far.   Labs   CBC: Recent Labs  Lab 02/11/23 1944 02/11/23 2007 02/12/23 0132 02/12/23 0626  WBC  --  30.0*  --  21.6*  NEUTROABS  --  25.7*  --   --   HGB 12.9* 11.8* 13.6 12.2*  HCT 38.0* 33.6* 40.0 33.1*  MCV  --  80.0  --  79.4*  PLT  --  127*  --  168    Basic Metabolic Panel: Recent Labs  Lab 02/11/23 1944 02/11/23 2145 02/12/23 0132 02/12/23 0626  NA 144 142 145  --   K 3.6 4.2 3.2*  --   CL 113* 111  --   --   CO2  --  20*  --   --   GLUCOSE 107* 105*  --   --   BUN 19 16  --   --   CREATININE 1.00 1.07  --  0.98  CALCIUM  --  9.3  --   --    GFR: Estimated Creatinine Clearance: 90.2 mL/min (by C-G formula based on SCr of 0.98 mg/dL). Recent Labs  Lab 02/11/23 2007 02/11/23 2313 02/12/23 0626  WBC 30.0*  --  21.6*  LATICACIDVEN  --  0.8  --      Liver Function Tests: Recent Labs  Lab 02/11/23 2145  AST 46*  ALT 28  ALKPHOS 99  BILITOT 1.9*  PROT 8.1  ALBUMIN 3.8   No results for input(s): "LIPASE", "AMYLASE" in the last 168 hours. Recent Labs  Lab 02/11/23 2313  AMMONIA 29    ABG    Component Value Date/Time   PHART 7.277 (L) 02/12/2023 0132   PCO2ART 49.6 (H) 02/12/2023 0132   PO2ART 276 (H) 02/12/2023 0132   HCO3 23.3 02/12/2023 0132   TCO2 25 02/12/2023 0132   ACIDBASEDEF 4.0 (H) 02/12/2023 0132   O2SAT 100 02/12/2023 0132     Coagulation Profile: Recent Labs  Lab 02/11/23 2110  INR 1.0    Cardiac Enzymes: No results for input(s): "CKTOTAL", "CKMB", "CKMBINDEX", "TROPONINI" in the last 168 hours.  HbA1C: Hgb A1c MFr Bld  Date/Time Value Ref Range Status  10/01/2013 01:29 PM 4.4 <5.7 % Final    Comment:                                                                           According to the ADA Clinical Practice Recommendations for 2011, when HbA1c is used as a screening test:     >=6.5%   Diagnostic of Diabetes Mellitus            (if abnormal result is confirmed)   5.7-6.4%   Increased risk of developing Diabetes Mellitus   References:Diagnosis and Classification of Diabetes Mellitus,Diabetes Care,2011,34(Suppl 1):S62-S69 and Standards of Medical Care in         Diabetes - 2011,Diabetes Care,2011,34 (Suppl 1):S11-S61.      CBG: Recent Labs  Lab 02/11/23 1939 02/12/23 0555 02/12/23 0740  GLUCAP 114* 91 122*    CRITICAL CARE Performed by: Lanier Clam   Total critical care time: 36 minutes  Critical care time was exclusive of separately billable procedures and treating other patients. Critical care was necessary to treat or prevent imminent or life-threatening deterioration.  Critical care was time spent personally by me on the following activities: development of treatment plan with patient and/or surrogate as well as nursing, discussions with consultants, evaluation  of patient's response to treatment, examination of patient, obtaining history from patient or surrogate, ordering and performing treatments and interventions, ordering and review of laboratory studies,  ordering and review of radiographic studies, pulse oximetry and re-evaluation of patient's condition.  Tessie Fass MSN, AGACNP-BC [facility] Pulmonary/Critical Care Medicine Amion for LP  02/12/2023, 9:48 AM

## 2023-02-12 NOTE — Progress Notes (Signed)
Pt transported on the ventilator from 4N15 to [facility] and back without complication. RT, RN and transport accompanied pt.

## 2023-02-12 NOTE — ED Provider Notes (Signed)
  [place] EMERGENCY DEPARTMENT AT [facility] Provider Note      Procedures Procedure Name: Intubation Date/Time: 02/12/2023 12:46 AM  Performed by: Roxy Horseman, PA-CPre-anesthesia Checklist: Patient identified, Patient being monitored, Emergency Drugs available, Timeout performed and Suction available Oxygen Delivery Method: Non-rebreather mask Preoxygenation: Pre-oxygenation with 100% oxygen Induction Type: Rapid sequence Ventilation: Mask ventilation without difficulty Laryngoscope Size: Glidescope and 3 Tube size: 7.5 mm Number of attempts: 1 Airway Equipment and Method: Video-laryngoscopy Placement Confirmation: ETT inserted through vocal cords under direct vision, CO2 detector and Breath sounds checked- equal and bilateral Secured at: 26 cm Tube secured with: ETT holder Dental Injury: Teeth and Oropharynx as per pre-operative assessment       Dr. Eudelia Bunch asked me to assist with intubation to facilitate workup.  Patient combative, agitated, and confused.   Roxy Horseman, PA-C 02/12/23 1610    Nira Conn, MD 02/12/23 [phone]

## 2023-02-12 NOTE — Progress Notes (Signed)
Request received for fluoro guided LP.   57 yo male who was found wandering around [place] altered, currently intubated for airway protection. There is suspicion for meningitis. Multiple attempts at reaching family for surrogate decision making, without success. Sister Dorette Grate was called this morning, she did not answer and unable to leave a voice message.   Bedside LP was attempted by the critical care but unsuccessful. Discussed with critical care team, decision was made to proceed with an emergent consent due to patient's incapacity to make decisions, and inability to reach surrogate decision maker.    Lynann Bologna Ho Parisi PA-C 02/12/2023 11:34 AM

## 2023-02-12 NOTE — Progress Notes (Signed)
eLink Physician-Brief Progress Note Patient Name: Peter Kline DOB: [birth year] MRN: [id number]   Date of Service  02/12/2023  HPI/Events of Note  Patient with sub-optimal sedation and extreme agitation on the ventilator. He has also been bradycardic.  eICU Interventions  Precedex discontinued and Propofol substituted, Fentanyl ceiling increased to 300 mcg.        Migdalia Dk 02/12/2023, 8:49 PM

## 2023-02-12 NOTE — Progress Notes (Signed)
Patient transported to 4N15 from ED without complications. RN at bedside.

## 2023-02-12 NOTE — Plan of Care (Signed)
Peter Kline is critically ill, admitted to the ICU after presenting to the ED with AMS. He was intubated in the ED and is unable to make decisions at this time. There have been multiple attempts at reaching family for surrogate decision making, without success.   There is clinical suspicion for possible meningitis, and for further workup a lumbar puncture is indicated. I have discussed the case with IR, and we agree that given pts incapacity to make decisions, and inability to reach surrogate decision maker that proceeding with LP with emergent consent is indicated.    Tessie Fass MSN, AGACNP-BC [facility] Pulmonary/Critical Care Medicine 02/12/2023, 11:07 AM

## 2023-02-13 ENCOUNTER — Inpatient Hospital Stay (HOSPITAL_COMMUNITY): Payer: 59

## 2023-02-13 DIAGNOSIS — J9601 Acute respiratory failure with hypoxia: Secondary | ICD-10-CM

## 2023-02-13 LAB — BASIC METABOLIC PANEL
Anion gap: 10 (ref 5–15)
BUN: 21 mg/dL — ABNORMAL HIGH (ref 6–20)
CO2: 19 mmol/L — ABNORMAL LOW (ref 22–32)
Calcium: 8.5 mg/dL — ABNORMAL LOW (ref 8.9–10.3)
Chloride: 109 mmol/L (ref 98–111)
Creatinine, Ser: 1.02 mg/dL (ref 0.61–1.24)
GFR, Estimated: >60 mL/min (ref >60)
Glucose, Bld: 168 mg/dL — ABNORMAL HIGH (ref 70–99)
Potassium: 3.7 mmol/L (ref 3.5–5.1)
Sodium: 138 mmol/L (ref 135–145)

## 2023-02-13 LAB — RESPIRATORY PANEL BY PCR

## 2023-02-13 LAB — GLUCOSE, CAPILLARY
Glucose-Capillary: 149 mg/dL — ABNORMAL HIGH (ref 70–99)
Glucose-Capillary: 180 mg/dL — ABNORMAL HIGH (ref 70–99)
Glucose-Capillary: 161 mg/dL — ABNORMAL HIGH (ref 70–99)
Glucose-Capillary: 149 mg/dL — ABNORMAL HIGH (ref 70–99)
Glucose-Capillary: 139 mg/dL — ABNORMAL HIGH (ref 70–99)
Glucose-Capillary: 141 mg/dL — ABNORMAL HIGH (ref 70–99)

## 2023-02-13 LAB — CBC
HCT: 26.6 % — ABNORMAL LOW (ref 39.0–52.0)
Hemoglobin: 9.5 g/dL — ABNORMAL LOW (ref 13.0–17.0)
MCH: 28.2 pg (ref 26.0–34.0)
MCHC: 35.7 g/dL (ref 30.0–36.0)
MCV: 78.9 fL — ABNORMAL LOW (ref 80.0–100.0)
Platelets: 158 10*3/uL (ref 150–400)
RBC: 3.37 MIL/uL — ABNORMAL LOW (ref 4.22–5.81)
RDW: 14.8 % (ref 11.5–15.5)
WBC: 17.6 10*3/uL — ABNORMAL HIGH (ref 4.0–10.5)
nRBC: 0.1 % (ref 0.0–0.2)

## 2023-02-13 LAB — HEMOGLOBIN AND HEMATOCRIT, BLOOD
HCT: 25.9 % — ABNORMAL LOW (ref 39.0–52.0)
Hemoglobin: 9.3 g/dL — ABNORMAL LOW (ref 13.0–17.0)

## 2023-02-13 LAB — PHOSPHORUS
Phosphorus: 3.3 mg/dL (ref 2.5–4.6)
Phosphorus: 3.3 mg/dL (ref 2.5–4.6)

## 2023-02-13 LAB — HEMOGLOBIN A1C
Hgb A1c MFr Bld: 4.8 % (ref 4.8–5.6)
Mean Plasma Glucose: 91 mg/dL

## 2023-02-13 LAB — TYPE AND SCREEN
ABO/RH(D): O NEG
Antibody Screen: NEGATIVE

## 2023-02-13 LAB — MAGNESIUM
Magnesium: 2.2 mg/dL (ref 1.7–2.4)
Magnesium: 2.5 mg/dL — ABNORMAL HIGH (ref 1.7–2.4)

## 2023-02-13 LAB — TRIGLYCERIDES: Triglycerides: 60 mg/dL (ref <150)

## 2023-02-13 MED ORDER — THIAMINE MONONITRATE 100 MG PO TABS
100.0000 mg | ORAL_TABLET | Freq: Every day | ORAL | Status: DC
  Administered 2023-02-19 – 2023-02-24 (×6): 100 mg
  Filled 2023-02-13 (×6): qty 1

## 2023-02-13 MED ORDER — SODIUM CHLORIDE 0.9 % IV SOLN
2.0000 g | INTRAVENOUS | Status: DC
  Administered 2023-02-14: 2 g via INTRAVENOUS
  Filled 2023-02-13: qty 20

## 2023-02-13 MED ORDER — PHENOBARBITAL 32.4 MG PO TABS
32.4000 mg | ORAL_TABLET | Freq: Three times a day (TID) | ORAL | Status: AC
  Administered 2023-02-17 – 2023-02-19 (×5): 32.4 mg
  Filled 2023-02-13 (×5): qty 1

## 2023-02-13 MED ORDER — ORAL CARE MOUTH RINSE: 15.0000 mL | OROMUCOSAL | Status: DC | PRN

## 2023-02-13 MED ORDER — ORAL CARE MOUTH RINSE
15.0000 mL | OROMUCOSAL | Status: DC
  Administered 2023-02-13 – 2023-02-20 (×87): 15 mL via OROMUCOSAL

## 2023-02-13 MED ORDER — GADOBUTROL 1 MMOL/ML IV SOLN
8.0000 mL | Freq: Once | INTRAVENOUS | Status: AC | PRN
  Administered 2023-02-13: 8 mL via INTRAVENOUS

## 2023-02-13 MED ORDER — ADULT MULTIVITAMIN W/MINERALS CH
1.0000 | ORAL_TABLET | Freq: Every day | ORAL | Status: DC
  Administered 2023-02-14 – 2023-02-24 (×11): 1
  Filled 2023-02-13 (×11): qty 1

## 2023-02-13 MED ORDER — POLYETHYLENE GLYCOL 3350 17 G PO PACK
17.0000 g | PACK | Freq: Every day | ORAL | Status: DC
  Administered 2023-02-13: 17 g
  Filled 2023-02-13: qty 1

## 2023-02-13 MED ORDER — DOXYCYCLINE HYCLATE 100 MG PO TABS
100.0000 mg | ORAL_TABLET | Freq: Two times a day (BID) | ORAL | Status: DC
  Administered 2023-02-13 – 2023-02-14 (×3): 100 mg
  Filled 2023-02-13 (×3): qty 1

## 2023-02-13 MED ORDER — PHENOBARBITAL 32.4 MG PO TABS
64.8000 mg | ORAL_TABLET | Freq: Three times a day (TID) | ORAL | Status: AC
  Administered 2023-02-15 – 2023-02-17 (×6): 64.8 mg
  Filled 2023-02-13 (×6): qty 2

## 2023-02-13 MED ORDER — FOLIC ACID 1 MG PO TABS
1.0000 mg | ORAL_TABLET | Freq: Every day | ORAL | Status: DC
  Administered 2023-02-13 – 2023-02-24 (×12): 1 mg
  Filled 2023-02-13 (×12): qty 1

## 2023-02-13 MED ORDER — ENOXAPARIN SODIUM 40 MG/0.4ML IJ SOSY
40.0000 mg | PREFILLED_SYRINGE | INTRAMUSCULAR | Status: DC
  Administered 2023-02-13 – 2023-02-15 (×3): 40 mg via SUBCUTANEOUS
  Filled 2023-02-13 (×3): qty 0.4

## 2023-02-13 MED ORDER — POTASSIUM CHLORIDE 20 MEQ PO PACK
20.0000 meq | PACK | Freq: Once | ORAL | Status: AC
  Administered 2023-02-13: 20 meq
  Filled 2023-02-13: qty 1

## 2023-02-13 MED ORDER — PHENOBARBITAL 32.4 MG PO TABS
97.2000 mg | ORAL_TABLET | Freq: Three times a day (TID) | ORAL | Status: AC
  Administered 2023-02-13 – 2023-02-15 (×6): 97.2 mg
  Filled 2023-02-13 (×6): qty 3

## 2023-02-13 MED ORDER — THIAMINE MONONITRATE 100 MG PO TABS
500.0000 mg | ORAL_TABLET | Freq: Three times a day (TID) | ORAL | Status: AC
  Administered 2023-02-13 – 2023-02-15 (×9): 500 mg
  Filled 2023-02-13 (×9): qty 5

## 2023-02-13 MED ORDER — THIAMINE MONONITRATE 100 MG PO TABS
250.0000 mg | ORAL_TABLET | Freq: Every day | ORAL | Status: AC
  Administered 2023-02-16 – 2023-02-18 (×3): 250 mg
  Filled 2023-02-13 (×3): qty 3

## 2023-02-13 NOTE — Plan of Care (Signed)
  Problem: Nutritional: Goal: Maintenance of adequate nutrition will improve Outcome: Progressing   Problem: Skin Integrity: Goal: Risk for impaired skin integrity will decrease Outcome: Progressing   Problem: Tissue Perfusion: Goal: Adequacy of tissue perfusion will improve Outcome: Progressing   

## 2023-02-13 NOTE — Progress Notes (Signed)
RT transported pt to MRI from 4N15 and back on ventilator without complication.  RNx2 at bedside for transport.

## 2023-02-13 NOTE — Progress Notes (Signed)
vLTM set up   All impedances below 10kohms  [facility] monitoring   Patient event button tested
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
## 2023-02-14 ENCOUNTER — Inpatient Hospital Stay (HOSPITAL_COMMUNITY): Payer: 59

## 2023-02-14 DIAGNOSIS — R4182 Altered mental status, unspecified: Secondary | ICD-10-CM | POA: Diagnosis not present

## 2023-02-14 DIAGNOSIS — G9340 Encephalopathy, unspecified: Secondary | ICD-10-CM

## 2023-02-14 DIAGNOSIS — A419 Sepsis, unspecified organism: Secondary | ICD-10-CM | POA: Diagnosis not present

## 2023-02-14 DIAGNOSIS — J9601 Acute respiratory failure with hypoxia: Secondary | ICD-10-CM | POA: Diagnosis not present

## 2023-02-14 LAB — COMPREHENSIVE METABOLIC PANEL
ALT: 23 U/L (ref 0–44)
AST: 30 U/L (ref 15–41)
Albumin: 2.8 g/dL — ABNORMAL LOW (ref 3.5–5.0)
Alkaline Phosphatase: 74 U/L (ref 38–126)
Anion gap: 6 (ref 5–15)
BUN: 22 mg/dL — ABNORMAL HIGH (ref 6–20)
CO2: 24 mmol/L (ref 22–32)
Calcium: 8.5 mg/dL — ABNORMAL LOW (ref 8.9–10.3)
Chloride: 111 mmol/L (ref 98–111)
Creatinine, Ser: 0.85 mg/dL (ref 0.61–1.24)
GFR, Estimated: >60 mL/min (ref >60)
Glucose, Bld: 127 mg/dL — ABNORMAL HIGH (ref 70–99)
Potassium: 3.5 mmol/L (ref 3.5–5.1)
Sodium: 141 mmol/L (ref 135–145)
Total Bilirubin: 0.8 mg/dL (ref 0.3–1.2)
Total Protein: 6.2 g/dL — ABNORMAL LOW (ref 6.5–8.1)

## 2023-02-14 LAB — CBC
HCT: 27.4 % — ABNORMAL LOW (ref 39.0–52.0)
Hemoglobin: 9.9 g/dL — ABNORMAL LOW (ref 13.0–17.0)
MCH: 28.6 pg (ref 26.0–34.0)
MCHC: 36.1 g/dL — ABNORMAL HIGH (ref 30.0–36.0)
MCV: 79.2 fL — ABNORMAL LOW (ref 80.0–100.0)
Platelets: 134 10*3/uL — ABNORMAL LOW (ref 150–400)
RBC: 3.46 MIL/uL — ABNORMAL LOW (ref 4.22–5.81)
RDW: 15.3 % (ref 11.5–15.5)
WBC: 21 10*3/uL — ABNORMAL HIGH (ref 4.0–10.5)
nRBC: 0.1 % (ref 0.0–0.2)

## 2023-02-14 LAB — COOXEMETRY PANEL
Carboxyhemoglobin: 2.7 % — ABNORMAL HIGH (ref 0.5–1.5)
Methemoglobin: <0.7 % (ref 0.0–1.5)
O2 Saturation: 99.3 %
Total hemoglobin: 9.5 g/dL — ABNORMAL LOW (ref 12.0–16.0)

## 2023-02-14 LAB — LEGIONELLA PNEUMOPHILA SEROGP 1 UR AG: L. pneumophila Serogp 1 Ur Ag: NEGATIVE

## 2023-02-14 LAB — VITAMIN B12: Vitamin B-12: 539 pg/mL (ref 180–914)

## 2023-02-14 LAB — AMMONIA: Ammonia: 39 umol/L — ABNORMAL HIGH (ref 9–35)

## 2023-02-14 LAB — GLUCOSE, CAPILLARY
Glucose-Capillary: 119 mg/dL — ABNORMAL HIGH (ref 70–99)
Glucose-Capillary: 125 mg/dL — ABNORMAL HIGH (ref 70–99)
Glucose-Capillary: 126 mg/dL — ABNORMAL HIGH (ref 70–99)
Glucose-Capillary: 109 mg/dL — ABNORMAL HIGH (ref 70–99)
Glucose-Capillary: 104 mg/dL — ABNORMAL HIGH (ref 70–99)
Glucose-Capillary: 97 mg/dL (ref 70–99)

## 2023-02-14 LAB — TSH: TSH: 2.547 u[IU]/mL (ref 0.350–4.500)

## 2023-02-14 LAB — FOLATE: Folate: 7.8 ng/mL (ref >5.9)

## 2023-02-14 MED ORDER — POTASSIUM CHLORIDE 20 MEQ PO PACK: 40.0000 meq | PACK | Freq: Once | ORAL | Status: DC

## 2023-02-14 MED ORDER — LORAZEPAM 2 MG/ML PO CONC
1.0000 mg | Freq: Two times a day (BID) | ORAL | Status: DC
  Administered 2023-02-14 – 2023-02-15 (×3): 1 mg
  Filled 2023-02-14: qty 0.5
  Filled 2023-02-14 (×2): qty 1

## 2023-02-14 MED ORDER — CLONAZEPAM 1 MG PO TABS
1.0000 mg | ORAL_TABLET | Freq: Two times a day (BID) | ORAL | Status: DC
  Administered 2023-02-14: 1 mg
  Filled 2023-02-14: qty 1

## 2023-02-14 MED ORDER — POTASSIUM CHLORIDE 20 MEQ PO PACK
40.0000 meq | PACK | Freq: Once | ORAL | Status: AC
  Administered 2023-02-14: 40 meq
  Filled 2023-02-14: qty 2

## 2023-02-14 MED ORDER — SODIUM CHLORIDE 0.9 % IV SOLN
3.0000 g | Freq: Four times a day (QID) | INTRAVENOUS | Status: DC
  Administered 2023-02-14 – 2023-02-17 (×12): 3 g via INTRAVENOUS
  Filled 2023-02-14 (×12): qty 8

## 2023-02-14 NOTE — Progress Notes (Signed)
Pharmacy Antibiotic Note  Peter Kline is a 57 y.o. male admitted on 02/11/2023 with  AMS and concerns for PNA vs. Meningitis . MRI brain was negative for meningitis. Patient was initially improving, but had emesis on 11/1 AM and now there is concern for aspiration pneumonia.  Pharmacy has been consulted for Unasyn (ampicillin-sulbactam) dosing.  Plan: Discontinue ceftriaxone and doxycycline Start Unasyn 3g IV q6h  Monitor daily CBC, temp, SCr, and for clinical signs of improvement  F/u cultures and de-escalate antibiotics as able   Height: 6' 0.01" (182.9 cm) Weight: 75.1 kg (165 lb 9.1 oz) IBW/kg (Calculated) : 77.62  Temp (24hrs), Avg:98.2 F (36.8 C), Min:97.2 F (36.2 C), Max:99.3 F (37.4 C)  Recent Labs  Lab 02/11/23 1944 02/11/23 2007 02/11/23 2145 02/11/23 2313 02/12/23 0626 02/13/23 0735 02/14/23 0655 02/14/23 0859  WBC  --  30.0*  --   --  21.6* 17.6* 21.0*  --   CREATININE 1.00  --  1.07  --  0.98 1.02  --  0.85  LATICACIDVEN  --   --   --  0.8  --   --   --   --     Estimated Creatinine Clearance: 100.6 mL/min (by C-G formula based on SCr of 0.85 mg/dL).    No Known Allergies  Antimicrobials this admission: CTX 10/30 >> 11/1 Doxy 10/31 >> 11/1 Acyclovir 10/30  Ampicillin 10/30  Vancomycin 10/30  Decadron (meningitis) 10/30 Unasyn 11/1 >>   Dose adjustments this admission: N/A  Microbiology results: 10/29 MRSA neg 10/29 strep pneumo urine neg 10/29 Bcx: ngtd x3 days 10/31 Resp cx: pending  Thank you for allowing pharmacy to be a part of this patient's care.  Wilburn Cornelia, PharmD, BCPS Clinical Pharmacist 02/14/2023 11:00 AM   Please refer to [facility] for pharmacy phone number la

## 2023-02-14 NOTE — TOC CM/SW Note (Signed)
Transition of Care [facility]) - Inpatient Brief Assessment   Patient Details  Name: Peter Kline MRN: [id number] Date of Birth: 1965-04-29  [facility] of [facility] CM/SW Contact:    Mearl Latin, LCSW Phone Number: 02/14/2023, 9:41 AM   Clinical Narrative: Patient admitted from home and is currently intubated. No TOC needs identified at this time but please place consult as needs arise.    Transition of Care Asessment: Insurance and Status: Insurance coverage has been reviewed Patient has primary care physician: Yes Home environment has been reviewed: From home Prior level of function:: Independent Prior/Current Home Services: No current home services Social Determinants of Health Reivew: SDOH reviewed no interventions necessary Readmission risk has been reviewed: Yes Transition of care needs: no transition of care needs at this time
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
## 2023-02-14 NOTE — Consult Note (Signed)
NEUROLOGY CONSULT NOTE   Date of service: February 14, 2023 Patient Name: Peter Kline MRN:  [id number] DOB:  Aug 29, 1965 Chief Complaint: "encephalopathy, persistent" Requesting Provider: Oretha Milch, MD  History of Present Illness  Peter Kline is a 57 y.o. male with hx of sickle cell, chronic pain, tobacco use, GERD, retinal hemorrhage who was found wandering the streets confused. He was brought in to the ED by EMS with confusion and slurred speech. He was intially evaluated as a code stroke and was not deemed candidate for tnkase due to unclear LKW. Felt to be toxidrome vs septic and thus code stroke was cancelled.  He was agitated in the ED despite multiple sedating medications and was intubated. LP at bedside was unsuccessful. He was started on empiric meningitis coverage which was narrowed in the Neuro ICU after negative MRI, unsucessful LP under fluoro.  Further workup with MRI Brain w + w/o C notable for a small acute L occipital lobe white matter stroke. CXR with lung opacities suspicious for pneumonia. LTM EEG negative for seizures.  Neurology consulted for further evaluation of persistent encephalopathy, requiring fentanyl and propofol.  Fiance at bedside reports EtOH and Marijuana use. Drinks as much as he can. Last time he seemed fine was on Sunday. Monday was still mostly with it but stumbling and confused. She did not know that he was taken to hosptial later.   ROS   Unable to ascertain due to intubation and sedation.  Past History   Past Medical History:  Diagnosis Date   Shortness of breath    Sickle cell anemia (HCC)     Past Surgical History:  Procedure Laterality Date   LAPAROSCOPIC GASTROTOMY W/ REPAIR OF ULCER      Family History: Family History  Problem Relation Age of Onset   Diabetes Mother    Hypertension Mother     Social History  reports that he has been smoking cigarettes. He has never used smokeless tobacco. He reports current alcohol  use of about 6.0 standard drinks of alcohol per week. He reports current drug use. Drug: Marijuana.  No Known Allergies  Medications   Current Facility-Administered Medications:    Ampicillin-Sulbactam (UNASYN) 3 g in sodium chloride 0.9 % 100 mL IVPB, 3 g, Intravenous, Q6H, Doristine Counter, RPH, Stopped at 02/14/23 1224   Chlorhexidine Gluconate Cloth 2 % PADS 6 each, 6 each, Topical, Daily, Briant Sites, DO, 6 each at 02/13/23 2136   docusate (COLACE) 50 MG/5ML liquid 100 mg, 100 mg, Per Tube, BID PRN, Briant Sites, DO, 100 mg at 02/14/23 0905   enoxaparin (LOVENOX) injection 40 mg, 40 mg, Subcutaneous, Q24H, Simpson, Paula B, NP, 40 mg at 02/14/23 0905   famotidine (PEPCID) tablet 20 mg, 20 mg, Per Tube, BID, Briant Sites, DO, 20 mg at 02/14/23 0905   feeding supplement (OSMOLITE 1.5 CAL) liquid 1,000 mL, 1,000 mL, Per Tube, Continuous, Agarwala, Daleen Bo, MD, Last Rate: 25 mL/hr at 02/14/23 1300, Infusion Verify at 02/14/23 1300   feeding supplement (PROSource TF20) liquid 60 mL, 60 mL, Per Tube, BID, Agarwala, Ravi, MD, 60 mL at 02/13/23 2136   fentaNYL (SUBLIMAZE) bolus via infusion 50-100 mcg, 50-100 mcg, Intravenous, Q15 min PRN, Cardama, Amadeo Garnet, MD, 100 mcg at 02/14/23 0744   fentaNYL (SUBLIMAZE) injection 50 mcg, 50 mcg, Intravenous, Once, Cardama, Amadeo Garnet, MD   fentaNYL in NS (36mcg/ml) infusion-PREMIX, 50-300 mcg/hr, Intravenous, Continuous, Ogan, Okoronkwo U, MD, Last Rate: 25 mL/hr at 02/14/23 1300, 250  mcg/hr at 02/14/23 1300   folic acid (FOLVITE) tablet 1 mg, 1 mg, Per Tube, Daily, Selmer Dominion B, NP, 1 mg at 02/14/23 0905   insulin aspart (novoLOG) injection 0-9 Units, 0-9 Units, Subcutaneous, Q4H, Briant Sites, DO, 1 Units at 02/14/23 1152   ipratropium-albuterol (DUONEB) 0.5-2.5 (3) MG/3ML nebulizer solution 3 mL, 3 mL, Nebulization, Q6H, Gaynell Face, Jessica, DO, 3 mL at 02/14/23 1350   ipratropium-albuterol (DUONEB) 0.5-2.5 (3)  MG/3ML nebulizer solution 3 mL, 3 mL, Nebulization, Q6H PRN, Briant Sites, DO   LORazepam (ATIVAN) 2 MG/ML concentrated solution 1 mg, 1 mg, Per Tube, BID, Oretha Milch, MD, 1 mg at 02/14/23 1242   midazolam (VERSED) injection 1-2 mg, 1-2 mg, Intravenous, Q1H PRN, Cardama, Amadeo Garnet, MD, 2 mg at 02/14/23 0503   multivitamin with minerals tablet 1 tablet, 1 tablet, Per Tube, Daily, Knute Neu, RPH   norepinephrine (LEVOPHED) 4mg  in (0.016 mg/mL) premix infusion, 2-10 mcg/min, Intravenous, Titrated, Agarwala, Ravi, MD   Oral care mouth rinse, 15 mL, Mouth Rinse, Q2H, Agarwala, Ravi, MD, 15 mL at 02/14/23 1153   Oral care mouth rinse, 15 mL, Mouth Rinse, PRN, Agarwala, Daleen Bo, MD   PHENobarbital (LUMINAL) tablet 97.2 mg, 97.2 mg, Per Tube, Q8H, 97.2 mg at 02/14/23 0905 **FOLLOWED BY** [START ON 02/15/2023] PHENobarbital (LUMINAL) tablet 64.8 mg, 64.8 mg, Per Tube, Q8H **FOLLOWED BY** [START ON 02/17/2023] PHENobarbital (LUMINAL) tablet 32.4 mg, 32.4 mg, Per Tube, Q8H, Clinton Sawyer, Erin R, RPH   polyethylene glycol (MIRALAX / GLYCOLAX) packet 17 g, 17 g, Per Tube, Daily, Selmer Dominion B, NP, 17 g at 02/13/23 0925   propofol (DIPRIVAN) 1000 MG/100ML infusion, 5-50 mcg/kg/min, Intravenous, Titrated, Ogan, Okoronkwo U, MD, Last Rate: 17.35 mL/hr at 02/14/23 1351, 35 mcg/kg/min at 02/14/23 1351   thiamine (VITAMIN B1) tablet 500 mg, 500 mg, Per Tube, Q8H, 500 mg at 02/14/23 0503 **FOLLOWED BY** [START ON 02/16/2023] thiamine (VITAMIN B1) tablet 250 mg, 250 mg, Per Tube, Daily **FOLLOWED BY** [START ON 02/19/2023] thiamine (VITAMIN B1) tablet 100 mg, 100 mg, Per Tube, Daily, Agarwala, Daleen Bo, MD  Vitals   Vitals:   02/14/23 1104 02/14/23 1200 02/14/23 1300 02/14/23 1350  BP:  108/66 108/61   Pulse: 93 75 85 77  Resp: 16 17 18 17   Temp: 99.7 F (37.6 C) 99.7 F (37.6 C) 99.7 F (37.6 C) 99.9 F (37.7 C)  TempSrc:      SpO2: 94% 92% 94% 95%  Weight:      Height:        Body mass index  is 22.45 kg/m.  Physical Exam   Constitutional: Appears well-developed and well-nourished.  Psych: Affect appropriate to situation.  Eyes: No scleral injection.  HENT: No OP obstruction.  Head: Normocephalic.  Cardiovascular: Normal rate and regular rhythm.  Respiratory: breathing over vent. GI: Soft.  No distension. There is no tenderness.  Skin: WDI.   Neurologic Examination on propofol and fnetanyl  Mental status/Cognition: partial and brief eye opening to loud clap. No response to voice or tactile stimulation. To nares stimulation, vigorously moves his head left to right and trying to move away. Speech/language: mute, no speech, no attempts to communicate. Cranial nerves:   CN II Pupils equal and reactive to light, R pupil was slightly oblong   CN III,IV,VI Dysconjugate gaze, no nystagmus, dolls eyes reflex is intact.   CN V Corneals intact BL   CN VII Symmetric upper facial grimace noted to nares stimulation, difficult to assess for lower facial asymmetry  with tube hardware   CN VIII Opened eyes partially to loud clap   CN IX & X Cough intact, bites down hard and unable to get the tongue depressor far back enough to elicit a gag.   CN XI Does move head left to right w nare sstimulation   CN XII Unable to assess.   Sensory/Motor:  Muscle bulk: normal, tone flaccid in all extremities. In restraints but no response to proximal pinch. To nares stimulation, vigorous pulling BL upper extremities towards his nose. No response to proximal pinch in BL lower extremities.   Coordination/Complex Motor:  Unable to assess coordination.  Labs   CBC:  Recent Labs  Lab 02/11/23 2007 02/12/23 0132 02/13/23 0735 02/13/23 1652 02/14/23 0655  WBC 30.0*   < > 17.6*  --  21.0*  NEUTROABS 25.7*  --   --   --   --   HGB 11.8*   < > 9.5* 9.3* 9.9*  HCT 33.6*   < > 26.6* 25.9* 27.4*  MCV 80.0   < > 78.9*  --  79.2*  PLT 127*   < > 158  --  134*   < > = values in this interval not  displayed.    Basic Metabolic Panel:  Lab Results  Component Value Date   NA 141 02/14/2023   K 3.5 02/14/2023   CO2 24 02/14/2023   GLUCOSE 127 (H) 02/14/2023   BUN 22 (H) 02/14/2023   CREATININE 0.85 02/14/2023   CALCIUM 8.5 (L) 02/14/2023   GFRNONAA >60 02/14/2023   GFRAA 110 11/25/2018   Lipid Panel:  Lab Results  Component Value Date   LDLCALC 62 07/30/2012   HgbA1c:  Lab Results  Component Value Date   HGBA1C 4.8 02/12/2023   Urine Drug Screen:     Component Value Date/Time   LABOPIA NONE DETECTED 02/11/2023 2128   COCAINSCRNUR NONE DETECTED 02/11/2023 2128   LABBENZ NONE DETECTED 02/11/2023 2128   AMPHETMU NONE DETECTED 02/11/2023 2128   THCU POSITIVE (A) 02/11/2023 2128   LABBARB NONE DETECTED 02/11/2023 2128    Alcohol Level     Component Value Date/Time   ETH <10 02/11/2023 2007   INR  Lab Results  Component Value Date   INR 1.0 02/11/2023   APTT  Lab Results  Component Value Date   APTT 21 (L) 02/11/2023   AED levels: No results found for: "PHENYTOIN", "ZONISAMIDE", "LAMOTRIGINE", "LEVETIRACETA"   CT Head without contrast(Personally reviewed): CTH was negative for a large hypodensity concerning for a large territory infarct or hyperdensity concerning for an ICH  MRI Brain(Personally reviewed): No evidence of infection, small left occipital lobe infarct.  cEEG:  No seizures  Impression   Peter Kline is a 57 y.o. male ith hx of sickle cell, chronic pain, tobacco use, GERD, retinal hemorrhage who was found wandering the streets confused and got intubated for persistent agitation. Etiology is unclear. MRI brain, cEEG unrevealing, failed LP at bedside and is being treated for aspiration pneumonia. Difficulty weaning off sedation due to agitaition and vent dyssnychrony.   Agree with treating as acute EtOH withdrawal.  Recommendations  - Co-oximetry panel - Volatile EtOH panel. - wean sedation as able - LTM EEG, can be discontinued if  no seizures overnight. - If continues to be agitation when attempting to wean off vent, can try IV valproic acid, gabapentin or clonidine to help. ______________________________________________________________________    Welton Flakes

## 2023-02-14 NOTE — Procedures (Signed)
Patient Name: Peter Kline  MRN: [id number]  Epilepsy Attending: Charlsie Quest  Referring Physician/Provider: Lynnell Catalan, MD  Duration: 02/13/2023 1008 to 02/14/2023 1008  Patient history: 57yo M with ams getting eeg to evaluate for seizure  Level of alertness:  comatose  AEDs during EEG study: Propofol, Phenobarb  Technical aspects: This EEG study was done with scalp electrodes positioned according to the 10-20 International system of electrode placement. Electrical activity was reviewed with band pass filter of 1-70Hz , sensitivity of 7 uV/mm, display speed of 62mm/sec with a 60Hz  notched filter applied as appropriate. EEG data were recorded continuously and digitally stored.  Video monitoring was available and reviewed as appropriate.  Description: EEG showed continuous generalized predominantly 5 to 6 Hz theta slowing admixed with intermittent 2-3hz  delta slowing. Hyperventilation and photic stimulation were not performed.     ABNORMALITY - Continuous slow, generalized  IMPRESSION: This study is suggestive of moderate to severe diffuse encephalopathy. No seizures or epileptiform discharges were seen throughout the recording.  Ria Redcay [name]

## 2023-02-14 NOTE — Plan of Care (Signed)
  Problem: Activity: Goal: Risk for activity intolerance will decrease Outcome: Progressing   Problem: Nutrition: Goal: Adequate nutrition will be maintained Outcome: Progressing   Problem: Coping: Goal: Level of anxiety will decrease Outcome: Progressing   Problem: Pain Management: Goal: General experience of comfort will improve Outcome: Progressing   Problem: Safety: Goal: Ability to remain free from injury will improve Outcome: Progressing

## 2023-02-14 NOTE — Progress Notes (Signed)
0.5 mL ativan wasted in stericycle with Forde Dandy.

## 2023-02-15 ENCOUNTER — Inpatient Hospital Stay (HOSPITAL_COMMUNITY): Payer: 59

## 2023-02-15 DIAGNOSIS — J159 Unspecified bacterial pneumonia: Secondary | ICD-10-CM | POA: Diagnosis not present

## 2023-02-15 DIAGNOSIS — R4182 Altered mental status, unspecified: Secondary | ICD-10-CM | POA: Diagnosis not present

## 2023-02-15 DIAGNOSIS — E43 Unspecified severe protein-calorie malnutrition: Secondary | ICD-10-CM

## 2023-02-15 DIAGNOSIS — J9601 Acute respiratory failure with hypoxia: Secondary | ICD-10-CM | POA: Diagnosis not present

## 2023-02-15 DIAGNOSIS — R569 Unspecified convulsions: Secondary | ICD-10-CM | POA: Diagnosis not present

## 2023-02-15 LAB — BASIC METABOLIC PANEL
Anion gap: 6 (ref 5–15)
BUN: 13 mg/dL (ref 6–20)
CO2: 24 mmol/L (ref 22–32)
Calcium: 8.6 mg/dL — ABNORMAL LOW (ref 8.9–10.3)
Chloride: 115 mmol/L — ABNORMAL HIGH (ref 98–111)
Creatinine, Ser: 0.79 mg/dL (ref 0.61–1.24)
GFR, Estimated: >60 mL/min (ref >60)
Glucose, Bld: 113 mg/dL — ABNORMAL HIGH (ref 70–99)
Potassium: 4.2 mmol/L (ref 3.5–5.1)
Sodium: 145 mmol/L (ref 135–145)

## 2023-02-15 LAB — CBC
HCT: 25.9 % — ABNORMAL LOW (ref 39.0–52.0)
Hemoglobin: 9.3 g/dL — ABNORMAL LOW (ref 13.0–17.0)
MCH: 28.4 pg (ref 26.0–34.0)
MCHC: 35.9 g/dL (ref 30.0–36.0)
MCV: 79 fL — ABNORMAL LOW (ref 80.0–100.0)
Platelets: 142 10*3/uL — ABNORMAL LOW (ref 150–400)
RBC: 3.28 MIL/uL — ABNORMAL LOW (ref 4.22–5.81)
RDW: 14.9 % (ref 11.5–15.5)
WBC: 14.6 10*3/uL — ABNORMAL HIGH (ref 4.0–10.5)
nRBC: 0.4 % — ABNORMAL HIGH (ref 0.0–0.2)

## 2023-02-15 LAB — GLUCOSE, CAPILLARY
Glucose-Capillary: 135 mg/dL — ABNORMAL HIGH (ref 70–99)
Glucose-Capillary: 102 mg/dL — ABNORMAL HIGH (ref 70–99)
Glucose-Capillary: 117 mg/dL — ABNORMAL HIGH (ref 70–99)
Glucose-Capillary: 129 mg/dL — ABNORMAL HIGH (ref 70–99)
Glucose-Capillary: 91 mg/dL (ref 70–99)
Glucose-Capillary: 110 mg/dL — ABNORMAL HIGH (ref 70–99)

## 2023-02-15 LAB — MAGNESIUM: Magnesium: 2.3 mg/dL (ref 1.7–2.4)

## 2023-02-15 MED ORDER — DOCUSATE SODIUM 50 MG/5ML PO LIQD
100.0000 mg | Freq: Two times a day (BID) | ORAL | Status: DC
Start: 2023-02-15 — End: 2023-02-15
  Administered 2023-02-15: 100 mg
  Filled 2023-02-15: qty 10

## 2023-02-15 MED ORDER — OXYCODONE HCL 5 MG PO TABS
5.0000 mg | ORAL_TABLET | ORAL | Status: DC
  Administered 2023-02-15 – 2023-02-22 (×40): 5 mg
  Filled 2023-02-15 (×41): qty 1

## 2023-02-15 MED ORDER — POLYETHYLENE GLYCOL 3350 17 G PO PACK
17.0000 g | PACK | Freq: Every day | ORAL | Status: DC
Start: 2023-02-16 — End: 2023-02-18
  Administered 2023-02-16 – 2023-02-18 (×3): 17 g
  Filled 2023-02-15 (×3): qty 1

## 2023-02-15 MED ORDER — LACTULOSE 10 GM/15ML PO SOLN
10.0000 g | Freq: Two times a day (BID) | ORAL | Status: DC
  Administered 2023-02-15 – 2023-02-17 (×6): 10 g
  Filled 2023-02-15 (×6): qty 15

## 2023-02-15 MED ORDER — LORAZEPAM 2 MG/ML PO CONC
1.0000 mg | Freq: Three times a day (TID) | ORAL | Status: DC
  Administered 2023-02-15 – 2023-02-16 (×3): 1 mg
  Filled 2023-02-15 (×3): qty 1

## 2023-02-15 MED ORDER — ACETAMINOPHEN 325 MG PO TABS
650.0000 mg | ORAL_TABLET | Freq: Four times a day (QID) | ORAL | Status: DC | PRN
  Administered 2023-02-15 – 2023-02-18 (×8): 650 mg
  Filled 2023-02-15 (×8): qty 2

## 2023-02-15 MED ORDER — ENOXAPARIN SODIUM 40 MG/0.4ML IJ SOSY
40.0000 mg | PREFILLED_SYRINGE | INTRAMUSCULAR | Status: DC
  Administered 2023-02-16 – 2023-02-18 (×3): 40 mg via SUBCUTANEOUS
  Filled 2023-02-15 (×3): qty 0.4

## 2023-02-15 MED ORDER — POLYETHYLENE GLYCOL 3350 17 G PO PACK
17.0000 g | PACK | Freq: Two times a day (BID) | ORAL | Status: DC
Start: 2023-02-15 — End: 2023-02-15
  Administered 2023-02-15: 17 g
  Filled 2023-02-15: qty 1

## 2023-02-15 MED ORDER — LORAZEPAM 2 MG/ML PO CONC
2.0000 mg | Freq: Two times a day (BID) | ORAL | Status: DC
Start: 2023-02-15 — End: 2023-02-15

## 2023-02-15 MED ORDER — BISACODYL 10 MG RE SUPP
10.0000 mg | Freq: Every day | RECTAL | Status: DC | PRN
  Administered 2023-02-15: 10 mg via RECTAL
  Filled 2023-02-15: qty 1

## 2023-02-15 NOTE — Procedures (Addendum)
Patient Name: DORWIN FITZHENRY  MRN: [id number]  Epilepsy Attending: Charlsie Quest  Referring Physician/Provider: Lynnell Catalan, MD  Duration: 02/14/2023 1008 to 02/15/2023 1008   Patient history: 57yo M with ams getting eeg to evaluate for seizure   Level of alertness:  comatose   AEDs during EEG study: Propofol, Phenobarb   Technical aspects: This EEG study was done with scalp electrodes positioned according to the 10-20 International system of electrode placement. Electrical activity was reviewed with band pass filter of 1-70Hz , sensitivity of 7 uV/mm, display speed of 11mm/sec with a 60Hz  notched filter applied as appropriate. EEG data were recorded continuously and digitally stored.  Video monitoring was available and reviewed as appropriate.   Description: EEG showed continuous generalized predominantly 5 to 6 Hz theta slowing admixed with intermittent 2-3hz  delta slowing. Hyperventilation and photic stimulation were not performed.      ABNORMALITY - Continuous slow, generalized   IMPRESSION: This study is suggestive of moderate to severe diffuse encephalopathy. No seizures or epileptiform discharges were seen throughout the recording.   Fletcher Rathbun [name]

## 2023-02-15 NOTE — Progress Notes (Signed)
LTM maint complete - no skin breakdown under: Fp1 Fp2 [facility] monitored, Event button test confirmed by [facility].  

## 2023-02-15 NOTE — Procedures (Signed)
Patient Name: [name]  MRN: [id number]  Epilepsy Attending: Charlsie Quest  Referring Physician/Provider: Lynnell Catalan, MD  Duration: 02/15/2023 1008 to 02/15/2023 1340   Patient history: 57yo M with ams getting eeg to evaluate for seizure   Level of alertness:  comatose   AEDs during EEG study: Propofol, Phenobarb   Technical aspects: This EEG study was done with scalp electrodes positioned according to the 10-20 International system of electrode placement. Electrical activity was reviewed with band pass filter of 1-70Hz , sensitivity of 7 uV/mm, display speed of 82mm/sec with a 60Hz  notched filter applied as appropriate. EEG data were recorded continuously and digitally stored.  Video monitoring was available and reviewed as appropriate.   Description: EEG showed continuous generalized predominantly 5 to 6 Hz theta slowing admixed with intermittent 2-3hz  delta slowing. Hyperventilation and photic stimulation were not performed.      ABNORMALITY - Continuous slow, generalized   IMPRESSION: This study is suggestive of moderate to severe diffuse encephalopathy. No seizures or epileptiform discharges were seen throughout the recording.   [name] [name]

## 2023-02-15 NOTE — Progress Notes (Signed)
NEUROLOGY CONSULT FOLLOW UP NOTE   Date of service: February 15, 2023 Patient Name: Peter Kline MRN:  [id number] DOB:  02-20-1966  Brief HPI  Peter Kline is a 57 y.o. male  has a past medical history of Shortness of breath and Sickle cell anemia (HCC). who presented was found wandering the streets confused and got intubated for persistent agitation. Etiology is unclear. MRI brain, cEEG unrevealing, failed LP at bedside and is being treated for aspiration pneumonia. Difficulty weaning off sedation due to agitaition and vent dyssnychrony.    Interval Hx/subjective  - Received Midazolam 2 mg at 5 am, 9 am and noon - secretions are a barrier to extubation today  - febrile to 100.8 at 9:30 AM  Vitals   Vitals:   02/15/23 1130 02/15/23 1145 02/15/23 1153 02/15/23 1200  BP: 133/80 124/76  118/72  Pulse: 82 82 100 91  Resp: 19 (!) 21 (!) 24 (!) 23  Temp: 100.2 F (37.9 C) 100.2 F (37.9 C) 100.2 F (37.9 C) 100.2 F (37.9 C)  TempSrc:    Bladder  SpO2: 94% 94% 94% 93%  Weight:      Height:         Body mass index is 22.45 kg/m.  Physical Exam   Constitutional: Appears mildly ill  Psych: Minimally interactive Eyes: No scleral injection.  HENT: ETT in place Head: Normocephalic, EEG in place  Cardiovascular: Normal rate and regular rhythm.  Respiratory: Effort normal, non-labored breathing on vent GI: Soft.  No distension. There is no tenderness.  Skin: WDI.   Neurologic Examination   Mental status/Cognition: partial and brief eye opening to voice. Speech/language: mute, ETT, no attempts to communicate. Cranial nerves:   CN II Left pupil > right pupil, both sluggish   CN III,IV,VI Dysconjugate gaze, no nystagmus, dolls eyes reflex is intact.   CN V Corneals intact BL, slightly stronger on the left    CN VII Symmetric upper facial grimace noted to nares stimulation, difficult to assess for lower facial asymmetry with tube hardware   CN VIII Opened eyes partially to  loud clap   CN IX & X Cough intact, gag not assessed   CN XI Nare stimulation not completed today   CN XII Unable to assess.    Sensory/Motor:  Muscle bulk: normal, tone flaccid in all extremities. In restraints but no response to proximal pinch. [To nares stimulation, vigorous pulling BL upper extremities towards his nose on 10/31 exam] No response to proximal pinch in BL lower extremities.  Labs and Diagnostic Imaging   CBC:  Recent Labs  Lab 02/11/23 2007 02/12/23 0132 02/14/23 0655 02/15/23 0213  WBC 30.0*   < > 21.0* 14.6*  NEUTROABS 25.7*  --   --   --   HGB 11.8*   < > 9.9* 9.3*  HCT 33.6*   < > 27.4* 25.9*  MCV 80.0   < > 79.2* 79.0*  PLT 127*   < > 134* 142*   < > = values in this interval not displayed.    Basic Metabolic Panel:  Lab Results  Component Value Date   NA 145 02/15/2023   K 4.2 02/15/2023   CO2 24 02/15/2023   GLUCOSE 113 (H) 02/15/2023   BUN 13 02/15/2023   CREATININE 0.79 02/15/2023   CALCIUM 8.6 (L) 02/15/2023   GFRNONAA >60 02/15/2023   GFRAA 110 11/25/2018   Lipid Panel:  Lab Results  Component Value Date   LDLCALC 62 07/30/2012  HgbA1c:  Lab Results  Component Value Date   HGBA1C 4.8 02/12/2023   Urine Drug Screen:     Component Value Date/Time   LABOPIA NONE DETECTED 02/11/2023 2128   COCAINSCRNUR NONE DETECTED 02/11/2023 2128   LABBENZ NONE DETECTED 02/11/2023 2128   AMPHETMU NONE DETECTED 02/11/2023 2128   THCU POSITIVE (A) 02/11/2023 2128   LABBARB NONE DETECTED 02/11/2023 2128    Alcohol Level     Component Value Date/Time   ETH <10 02/11/2023 2007   INR  Lab Results  Component Value Date   INR 1.0 02/11/2023   APTT  Lab Results  Component Value Date   APTT 21 (L) 02/11/2023   AED levels: No results found for: "PHENYTOIN", "ZONISAMIDE", "LAMOTRIGINE", "LEVETIRACETA"  MRI Brain(Personally reviewed): no acute intracranial process   cEEG 10/31 - 11/2  Negative for seizures (though on significant  sedation)  Impression   Peter Kline is a 57 y.o. male with altered mental status of unclear etiology at this time.  Should be falling out of the window for significant alcohol withdrawal, agree with phenobarbital taper for this.  Has had a neck mass and LP was attempted but failed at bedside with ED team and CCM and with fluoro. Cannot definitively rule out meningitis but imaging reassuring and white count is normalizing  Recommendations  - continue phenobarbital taper - discontinue LTM EEG - If continues to be agitation when attempting to wean off vent, can try IV valproic acid, gabapentin or clonidine to help - consider decreasing benzos but defer to primary team - continue to monitor leukocytosis and fever curve - Neurology will follow along, discussed with CCM via secure chat  ______________________________________________________________________   Thank you for the opportunity to take part in the care of this patient. If you have any further questions, please contact the neurology consultation team on call. Updated oncall schedule is listed on AMION.  Signed,  Jerri Hargadon L Lasaundra Riche  CRITICAL CARE Performed by: Gordy Councilman   Total critical care time: 35 minutes  Critical care time was exclusive of separately billable procedures and treating other patients.  Critical care was necessary to treat or prevent imminent or life-threatening deterioration.  Critical care was time spent personally by me on the following activities: development of treatment plan with patient and/or surrogate as well as nursing, discussions with consultants, evaluation of patient's response to treatment, examination of patient, obtaining history from patient or surrogate, ordering and performing treatments and interventions, ordering and review of laboratory studies, ordering and review of radiographic studies, pulse oximetry and re-evaluation of patient's condition.

## 2023-02-15 NOTE — Progress Notes (Signed)
LTM EEG discontinued - no skin breakdown at unhook.   

## 2023-02-15 NOTE — Plan of Care (Signed)
Patient admitted s/p altered mental status. Upon initial assessment patient was found to have increased agitation with suctioning and patient care. Patient had multiple desaturation episodes while suctioning, copious secretions noted. Provider aware and at bedside to assess. PRN versed given as needed, patient showed signs of relief. PRN Tylenol given for fever, temp within normal limits. Adequate UOP noted, PRN suppository given, awaiting results. Fiance at bedside, updated in plan of care. ICU status maintained.   Problem: Education: Goal: Ability to describe self-care measures that may prevent or decrease complications (Diabetes Survival Skills Education) will improve 02/15/2023 1633 by Jeannine Kitten, RN Outcome: Not Progressing 02/15/2023 1633 by Jeannine Kitten, RN Outcome: Not Progressing Goal: Individualized Educational Video(s) 02/15/2023 1633 by Jeannine Kitten, RN Outcome: Not Progressing 02/15/2023 1633 by Jeannine Kitten, RN Outcome: Not Progressing   Problem: Coping: Goal: Ability to adjust to condition or change in health will improve 02/15/2023 1633 by Jeannine Kitten, RN Outcome: Not Progressing 02/15/2023 1633 by Jeannine Kitten, RN Outcome: Not Progressing   Problem: Fluid Volume: Goal: Ability to maintain a balanced intake and output will improve 02/15/2023 1633 by Jeannine Kitten, RN Outcome: Not Progressing 02/15/2023 1633 by Jeannine Kitten, RN Outcome: Not Progressing   Problem: Health Behavior/Discharge Planning: Goal: Ability to identify and utilize available resources and services will improve 02/15/2023 1633 by Jeannine Kitten, RN Outcome: Not Progressing 02/15/2023 1633 by Jeannine Kitten, RN Outcome: Not Progressing Goal: Ability to manage health-related needs will improve 02/15/2023 1633 by Jeannine Kitten, RN Outcome: Not Progressing 02/15/2023 1633 by Jeannine Kitten, RN Outcome: Not Progressing   Problem:  Metabolic: Goal: Ability to maintain appropriate glucose levels will improve 02/15/2023 1633 by Jeannine Kitten, RN Outcome: Not Progressing 02/15/2023 1633 by Jeannine Kitten, RN Outcome: Not Progressing   Problem: Nutritional: Goal: Maintenance of adequate nutrition will improve 02/15/2023 1633 by Jeannine Kitten, RN Outcome: Not Progressing 02/15/2023 1633 by Jeannine Kitten, RN Outcome: Not Progressing Goal: Progress toward achieving an optimal weight will improve 02/15/2023 1633 by Jeannine Kitten, RN Outcome: Not Progressing 02/15/2023 1633 by Jeannine Kitten, RN Outcome: Not Progressing   Problem: Skin Integrity: Goal: Risk for impaired skin integrity will decrease 02/15/2023 1633 by Jeannine Kitten, RN Outcome: Not Progressing 02/15/2023 1633 by Jeannine Kitten, RN Outcome: Not Progressing   Problem: Tissue Perfusion: Goal: Adequacy of tissue perfusion will improve 02/15/2023 1633 by Jeannine Kitten, RN Outcome: Not Progressing 02/15/2023 1633 by Jeannine Kitten, RN Outcome: Not Progressing   Problem: Safety: Goal: Non-violent Restraint(s) 02/15/2023 1633 by Jeannine Kitten, RN Outcome: Not Progressing 02/15/2023 1633 by Jeannine Kitten, RN Outcome: Not Progressing   Problem: Education: Goal: Knowledge of General Education information will improve Description: Including pain rating scale, medication(s)/side effects and non-pharmacologic comfort measures 02/15/2023 1633 by Jeannine Kitten, RN Outcome: Not Progressing 02/15/2023 1633 by Jeannine Kitten, RN Outcome: Not Progressing   Problem: Health Behavior/Discharge Planning: Goal: Ability to manage health-related needs will improve 02/15/2023 1633 by Jeannine Kitten, RN Outcome: Not Progressing 02/15/2023 1633 by Jeannine Kitten, RN Outcome: Not Progressing   Problem: Clinical Measurements: Goal: Ability to maintain clinical measurements within normal limits will  improve 02/15/2023 1633 by Jeannine Kitten, RN Outcome: Not Progressing 02/15/2023 1633 by Jeannine Kitten, RN Outcome: Not Progressing Goal: Will remain free from infection 02/15/2023 1633 by Jeannine Kitten, RN Outcome: Not Progressing 02/15/2023 1633 by Genelle Gather,  Isabel Caprice, RN Outcome: Not Progressing Goal: Diagnostic test results will improve 02/15/2023 1633 by Jeannine Kitten, RN Outcome: Not Progressing 02/15/2023 1633 by Jeannine Kitten, RN Outcome: Not Progressing Goal: Respiratory complications will improve 02/15/2023 1633 by Jeannine Kitten, RN Outcome: Not Progressing 02/15/2023 1633 by Jeannine Kitten, RN Outcome: Not Progressing Goal: Cardiovascular complication will be avoided 02/15/2023 1633 by Jeannine Kitten, RN Outcome: Not Progressing 02/15/2023 1633 by Jeannine Kitten, RN Outcome: Not Progressing   Problem: Activity: Goal: Risk for activity intolerance will decrease 02/15/2023 1633 by Jeannine Kitten, RN Outcome: Not Progressing 02/15/2023 1633 by Jeannine Kitten, RN Outcome: Not Progressing   Problem: Nutrition: Goal: Adequate nutrition will be maintained 02/15/2023 1633 by Jeannine Kitten, RN Outcome: Not Progressing 02/15/2023 1633 by Jeannine Kitten, RN Outcome: Not Progressing   Problem: Coping: Goal: Level of anxiety will decrease 02/15/2023 1633 by Jeannine Kitten, RN Outcome: Not Progressing 02/15/2023 1633 by Jeannine Kitten, RN Outcome: Not Progressing   Problem: Elimination: Goal: Will not experience complications related to bowel motility 02/15/2023 1633 by Jeannine Kitten, RN Outcome: Not Progressing 02/15/2023 1633 by Jeannine Kitten, RN Outcome: Not Progressing Goal: Will not experience complications related to urinary retention 02/15/2023 1633 by Jeannine Kitten, RN Outcome: Not Progressing 02/15/2023 1633 by Jeannine Kitten, RN Outcome: Not Progressing   Problem: Pain Management: Goal:  General experience of comfort will improve 02/15/2023 1633 by Jeannine Kitten, RN Outcome: Not Progressing 02/15/2023 1633 by Jeannine Kitten, RN Outcome: Not Progressing   Problem: Safety: Goal: Ability to remain free from injury will improve 02/15/2023 1633 by Jeannine Kitten, RN Outcome: Not Progressing 02/15/2023 1633 by Jeannine Kitten, RN Outcome: Not Progressing   Problem: Skin Integrity: Goal: Risk for impaired skin integrity will decrease 02/15/2023 1633 by Jeannine Kitten, RN Outcome: Not Progressing 02/15/2023 1633 by Jeannine Kitten, RN Outcome: Not Progressing

## 2023-02-15 NOTE — Progress Notes (Addendum)
OG noted to be at 30 at the lip after agitation and coughing fit desaturation noted to high 80s. OG advanced to 60 cm. KUB ordered, awaiting results. Tube feeds paused during this time.
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
## 2023-02-16 ENCOUNTER — Inpatient Hospital Stay (HOSPITAL_COMMUNITY): Payer: 59

## 2023-02-16 DIAGNOSIS — J9601 Acute respiratory failure with hypoxia: Secondary | ICD-10-CM | POA: Diagnosis not present

## 2023-02-16 DIAGNOSIS — G9340 Encephalopathy, unspecified: Secondary | ICD-10-CM | POA: Diagnosis not present

## 2023-02-16 DIAGNOSIS — I6389 Other cerebral infarction: Secondary | ICD-10-CM | POA: Diagnosis not present

## 2023-02-16 DIAGNOSIS — R4182 Altered mental status, unspecified: Secondary | ICD-10-CM | POA: Diagnosis not present

## 2023-02-16 LAB — CBC
HCT: 28.5 % — ABNORMAL LOW (ref 39.0–52.0)
Hemoglobin: 10.1 g/dL — ABNORMAL LOW (ref 13.0–17.0)
MCH: 27.7 pg (ref 26.0–34.0)
MCHC: 35.4 g/dL (ref 30.0–36.0)
MCV: 78.3 fL — ABNORMAL LOW (ref 80.0–100.0)
Platelets: 154 10*3/uL (ref 150–400)
RBC: 3.64 MIL/uL — ABNORMAL LOW (ref 4.22–5.81)
RDW: 15 % (ref 11.5–15.5)
WBC: 17.4 10*3/uL — ABNORMAL HIGH (ref 4.0–10.5)
nRBC: 0.3 % — ABNORMAL HIGH (ref 0.0–0.2)

## 2023-02-16 LAB — VOLATILES,BLD-ACETONE,ETHANOL,ISOPROP,METHANOL
Acetone, blood: <0.01 g/dL (ref 0.000–0.010)
Ethanol, blood: <0.01 g/dL (ref 0.000–0.010)
Isopropanol, blood: <0.01 g/dL (ref 0.000–0.010)
Methanol, blood: <0.01 g/dL (ref 0.000–0.010)

## 2023-02-16 LAB — BASIC METABOLIC PANEL
Anion gap: 12 (ref 5–15)
BUN: 14 mg/dL (ref 6–20)
CO2: 26 mmol/L (ref 22–32)
Calcium: 9 mg/dL (ref 8.9–10.3)
Chloride: 109 mmol/L (ref 98–111)
Creatinine, Ser: 0.83 mg/dL (ref 0.61–1.24)
GFR, Estimated: >60 mL/min (ref >60)
Glucose, Bld: 101 mg/dL — ABNORMAL HIGH (ref 70–99)
Potassium: 3.7 mmol/L (ref 3.5–5.1)
Sodium: 147 mmol/L — ABNORMAL HIGH (ref 135–145)

## 2023-02-16 LAB — CULTURE, RESPIRATORY W GRAM STAIN: Culture: NO GROWTH

## 2023-02-16 LAB — AMMONIA: Ammonia: 53 umol/L — ABNORMAL HIGH (ref 9–35)

## 2023-02-16 LAB — CULTURE, BLOOD (ROUTINE X 2)
Culture: NO GROWTH
Culture: NO GROWTH
Special Requests: ADEQUATE
Special Requests: ADEQUATE

## 2023-02-16 LAB — GLUCOSE, CAPILLARY
Glucose-Capillary: 105 mg/dL — ABNORMAL HIGH (ref 70–99)
Glucose-Capillary: 93 mg/dL (ref 70–99)
Glucose-Capillary: 114 mg/dL — ABNORMAL HIGH (ref 70–99)
Glucose-Capillary: 84 mg/dL (ref 70–99)
Glucose-Capillary: 121 mg/dL — ABNORMAL HIGH (ref 70–99)
Glucose-Capillary: 114 mg/dL — ABNORMAL HIGH (ref 70–99)

## 2023-02-16 LAB — ECHOCARDIOGRAM COMPLETE
Area-P 1/2: 4.15 cm2
Height: 72.008 in
MV M vel: 5.23 m/s
MV Peak grad: 109.4 mm[Hg]
Radius: 0.4 cm
S' Lateral: 3.4 cm
Weight: 2649.05 [oz_av]

## 2023-02-16 LAB — TRIGLYCERIDES: Triglycerides: 128 mg/dL (ref <150)

## 2023-02-16 MED ORDER — GADOBUTROL 1 MMOL/ML IV SOLN
7.0000 mL | Freq: Once | INTRAVENOUS | Status: AC | PRN
Start: 2023-02-16 — End: 2023-02-16
  Administered 2023-02-16: 7 mL via INTRAVENOUS

## 2023-02-16 MED ORDER — SODIUM CHLORIDE 3 % IN NEBU
4.0000 mL | INHALATION_SOLUTION | Freq: Two times a day (BID) | RESPIRATORY_TRACT | Status: AC
  Administered 2023-02-16 – 2023-02-18 (×6): 4 mL via RESPIRATORY_TRACT
  Filled 2023-02-16 (×6): qty 4

## 2023-02-16 MED ORDER — LORAZEPAM 2 MG/ML PO CONC
2.0000 mg | Freq: Two times a day (BID) | ORAL | Status: DC
  Administered 2023-02-16 – 2023-02-22 (×12): 2 mg
  Filled 2023-02-16 (×12): qty 1

## 2023-02-16 MED ORDER — STROKE: EARLY STAGES OF RECOVERY BOOK
Freq: Once | Status: AC
Start: 2023-02-17 — End: 2023-02-17
  Filled 2023-02-16: qty 1

## 2023-02-16 MED ORDER — IOHEXOL 350 MG/ML SOLN
75.0000 mL | Freq: Once | INTRAVENOUS | Status: AC | PRN
  Administered 2023-02-16: 75 mL via INTRAVENOUS

## 2023-02-16 MED ORDER — FREE WATER
200.0000 mL | Status: DC
  Administered 2023-02-16 – 2023-02-18 (×12): 200 mL

## 2023-02-16 NOTE — Progress Notes (Signed)
Pt transported to and from MRI on vent. No complications noted.

## 2023-02-16 NOTE — Progress Notes (Signed)
Pt transported to and from CTA on vent w/o complications.

## 2023-02-16 NOTE — Plan of Care (Signed)
Pateint admitted s/p altered mental status. Neurology and primary team at bedside. MRI and CT performed today. Bedside echo done.  Copious secretions noted, frequent oral care and suctioning performed. Trickle tube feeds restarted per provider. PRN tylenol given for fever. Fiance at bedside updated in plan of care. ICU status maintained.   Problem: Education: Goal: Ability to describe self-care measures that may prevent or decrease complications (Diabetes Survival Skills Education) will improve Outcome: Not Progressing Goal: Individualized Educational Video(s) Outcome: Not Progressing   Problem: Coping: Goal: Ability to adjust to condition or change in health will improve Outcome: Not Progressing   Problem: Fluid Volume: Goal: Ability to maintain a balanced intake and output will improve Outcome: Not Progressing   Problem: Health Behavior/Discharge Planning: Goal: Ability to identify and utilize available resources and services will improve Outcome: Not Progressing Goal: Ability to manage health-related needs will improve Outcome: Not Progressing   Problem: Metabolic: Goal: Ability to maintain appropriate glucose levels will improve Outcome: Not Progressing   Problem: Nutritional: Goal: Maintenance of adequate nutrition will improve Outcome: Not Progressing Goal: Progress toward achieving an optimal weight will improve Outcome: Not Progressing   Problem: Skin Integrity: Goal: Risk for impaired skin integrity will decrease Outcome: Not Progressing   Problem: Tissue Perfusion: Goal: Adequacy of tissue perfusion will improve Outcome: Not Progressing   Problem: Safety: Goal: Non-violent Restraint(s) Outcome: Not Progressing   Problem: Education: Goal: Knowledge of General Education information will improve Description: Including pain rating scale, medication(s)/side effects and non-pharmacologic comfort measures Outcome: Not Progressing   Problem: Health  Behavior/Discharge Planning: Goal: Ability to manage health-related needs will improve Outcome: Not Progressing   Problem: Clinical Measurements: Goal: Ability to maintain clinical measurements within normal limits will improve Outcome: Not Progressing Goal: Will remain free from infection Outcome: Not Progressing Goal: Diagnostic test results will improve Outcome: Not Progressing Goal: Respiratory complications will improve Outcome: Not Progressing Goal: Cardiovascular complication will be avoided Outcome: Not Progressing   Problem: Activity: Goal: Risk for activity intolerance will decrease Outcome: Not Progressing   Problem: Nutrition: Goal: Adequate nutrition will be maintained Outcome: Not Progressing   Problem: Coping: Goal: Level of anxiety will decrease Outcome: Not Progressing   Problem: Elimination: Goal: Will not experience complications related to bowel motility Outcome: Not Progressing Goal: Will not experience complications related to urinary retention Outcome: Not Progressing   Problem: Pain Management: Goal: General experience of comfort will improve Outcome: Not Progressing   Problem: Safety: Goal: Ability to remain free from injury will improve Outcome: Not Progressing   Problem: Skin Integrity: Goal: Risk for impaired skin integrity will decrease Outcome: Not Progressing   Problem: Education: Goal: Knowledge of disease or condition will improve Outcome: Not Progressing Goal: Knowledge of secondary prevention will improve (MUST DOCUMENT ALL) Outcome: Not Progressing Goal: Knowledge of patient specific risk factors will improve [name] N/A or DELETE if not current risk factor) Outcome: Not Progressing   Problem: Ischemic Stroke/TIA Tissue Perfusion: Goal: Complications of ischemic stroke/TIA will be minimized Outcome: Not Progressing   Problem: Coping: Goal: Will verbalize positive feelings about self Outcome: Not Progressing Goal: Will  identify appropriate support needs Outcome: Not Progressing   Problem: Health Behavior/Discharge Planning: Goal: Ability to manage health-related needs will improve Outcome: Not Progressing Goal: Goals will be collaboratively established with patient/family Outcome: Not Progressing   Problem: Self-Care: Goal: Ability to participate in self-care as condition permits will improve Outcome: Not Progressing Goal: Verbalization of feelings and concerns over difficulty with self-care will improve Outcome:  Not Progressing Goal: Ability to communicate needs accurately will improve Outcome: Not Progressing   Problem: Nutrition: Goal: Risk of aspiration will decrease Outcome: Not Progressing Goal: Dietary intake will improve Outcome: Not Progressing
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
## 2023-02-16 NOTE — Progress Notes (Signed)
  Echocardiogram 2D Echocardiogram has been performed.  Delcie Roch 02/16/2023, 2:34 PM

## 2023-02-16 NOTE — Progress Notes (Signed)
While doing patient's 2000 NIH, the patient's right pupil appeared 3, round, and sluggish with a rightward gaze and the left pupil appeared 5, round, and sluggish. Per report, neurology team is aware of the intermittent gaze preference, however, the rightward gaze was not present in any previous assessments.  Neurology team was paged and advised they will come to the bedside to assess patient's pupillary changes.

## 2023-02-16 NOTE — Progress Notes (Addendum)
NEUROLOGY CONSULT FOLLOW UP NOTE   Date of service: February 16, 2023 Patient Name: Peter Kline MRN:  [id number] DOB:  [birth year]  Brief HPI  Peter Kline is a 57 y.o. male  has a past medical history of Shortness of breath and Sickle cell anemia (HCC). who presented was found wandering the streets confused and got intubated for persistent agitation. Etiology is unclear. MRI brain, cEEG unrevealing, failed LP at bedside and is being treated for aspiration pneumonia. Difficulty weaning off sedation due to agitaition and vent dyssnychrony.    Interval Hx/subjective  - Agitation events overnight despite sedation with propofol @ 30 and fentanyl gtt requiring PRN sedation medication; concern for further aspiration - Received Midazolam 2 mg at 5:04 am and 07:17 am for significant agitation - Increased secretions via bedside RN, febrile with overnight TMAX 101.5, worsening leukocytosis 14.6 -> 17.4 - Neurologic exam fluctuations per bedside RN with varying gaze preferences, pupillary fluctuations, and patient extreme agitation with sedation paused.  Persistent encephalopathy, patient not following commands.  - Reported 3 attempts at LP, 2 at bedside and 1 under fluoro since admission, unsuccessful; however all early in course may have been volume depleted  Vitals   Vitals:   02/16/23 0700 02/16/23 0704 02/16/23 0746 02/16/23 0800  BP: (!) 155/99   139/89  Pulse: 68  68 79  Resp: 18  18 16   Temp: 99.3 F (37.4 C)  99.7 F (37.6 C) 99.9 F (37.7 C)  TempSrc:    Bladder  SpO2: 99%  99% 100%  Weight:  75.1 kg    Height:        Body mass index is 22.45 kg/m.  Physical Exam   Constitutional: Critically ill appearing African American male laying in ICU bed, in no acute distress this morning with sedation briefly paused for exam. Remains in bilateral soft wrist restraints for agitation episodes overnight. No overt signs of meningismus on examination.  Psych: Minimally interactive Eyes:  No scleral injection.  HENT: ETT remains in place with securement device. Left neck mass with appears stable from prior notes, appreciate CCM eval Head: Normocephalic Cardiovascular: Normal rate and regular rhythm.  Respiratory: Respirations assisted via mechanical ventilator, patient initiates spontaneous respirations on vent. No respiratory distress noted on exam.  GI: Soft.  No distension. There is no tenderness.  Skin: WDI.   Neurologic Examination   Mental status/Cognition: Spontaneous, partial eye opening throughout exam.  Does not follow commands.   Speech/language: mute, ETT, no attempts to communicate. Cranial nerves:   CN II Ovoid right pupil > round left pupil, both sluggishly reactive to light   CN III,IV,VI Right gaze preference, dolls eyes reflex is intact, does not fixate or track.    CN V Corneals intact BL to eyelash brush, does not BTT on the left, BTT intact on thr right.   CN VII Facial symmetry obscured due to ETT and securement device, within that limitation, no overt facial asymmetry noted   CN VIII Unable to assess due to patient's condition   CN IX & X Cough intact, gag not assessed   CN XI Nare stimulation not completed today   CN XII Does not protrude tongue to command    Sensory/Motor:  Muscle bulk: normal, tone flaccid in all extremities. In bilateral soft wrist restraints but no response to nailbed pressure pinch. [Bedside RN reports strong agitated movements throughout when sedation paused without asymmetry or noted weakness]  Slight foot movement to tickle bilaterally   Labs and  Diagnostic Imaging   CBC:  Recent Labs  Lab 02/11/23 2007 02/12/23 0132 02/15/23 0213 02/16/23 0733  WBC 30.0*   < > 14.6* 17.4*  NEUTROABS 25.7*  --   --   --   HGB 11.8*   < > 9.3* 10.1*  HCT 33.6*   < > 25.9* 28.5*  MCV 80.0   < > 79.0* 78.3*  PLT 127*   < > 142* 154   < > = values in this interval not displayed.   Basic Metabolic Panel:  Lab Results  Component  Value Date   NA 147 (H) 02/16/2023   K 3.7 02/16/2023   CO2 26 02/16/2023   GLUCOSE 101 (H) 02/16/2023   BUN 14 02/16/2023   CREATININE 0.83 02/16/2023   CALCIUM 9.0 02/16/2023   GFRNONAA >60 02/16/2023   GFRAA 110 11/25/2018   Lipid Panel:  Lab Results  Component Value Date   CHOL 119 07/30/2012   HDL 45 07/30/2012   LDLCALC 62 07/30/2012   TRIG 128 02/16/2023     HgbA1c:  Lab Results  Component Value Date   HGBA1C 4.8 02/12/2023   Urine Drug Screen:     Component Value Date/Time   LABOPIA NONE DETECTED 02/11/2023 2128   COCAINSCRNUR NONE DETECTED 02/11/2023 2128   LABBENZ NONE DETECTED 02/11/2023 2128   AMPHETMU NONE DETECTED 02/11/2023 2128   THCU POSITIVE (A) 02/11/2023 2128   LABBARB NONE DETECTED 02/11/2023 2128    Alcohol Level     Component Value Date/Time   ETH <10 02/11/2023 2007   INR  Lab Results  Component Value Date   INR 1.0 02/11/2023   APTT  Lab Results  Component Value Date   APTT 21 (L) 02/11/2023   AED levels: No results found for: "PHENYTOIN", "ZONISAMIDE", "LAMOTRIGINE", "LEVETIRACETA"  MRI Brain 10/31 (Personally reviewed): no acute intracranial process   cEEG 10/31 - 11/2  Negative for seizures (though on significant sedation)  Impression   Peter Kline is a 57 y.o. male with acute encephalopathy of unclear etiology at this time.  - Should be falling out of the window for significant alcohol withdrawal, Day 4 since admission now. Agree with phenobarbital taper for this.  - Has had a neck mass and LP was attempted but failed at bedside with ED team and CCM and with fluoro.  - Cannot definitively rule out meningitis but imaging reassuring and white count previously normalizing prior to discontinuation of meningitic coverage. Worsening leukocytosis noted 02/16/23: WBC 21.0 -> 14.6 -> 17.4, febrile overnight to 101.5 degrees Fahrenheit, and with increased oral secretions via bedside RN, persistent encephalopathy.  Will obtain  repeat MRI brain with and without contrast prior to possible repeat bedside LP attempt (enoxaparin dose scheduled appropriately for repeat attempt as needed pending MRI results).  Recommendations  - repeat MRI brain with and without contrast STAT - pending MRI results, consider re-attempt bedside LP with ongoing concern for possible meningitis with persistent encephalopathy and worsening leukocytosis and fever following meningitis coverage discontinuation  - continue phenobarbital taper - If continues to be agitated when attempting to wean off vent, can try IV valproic acid, gabapentin or clonidine to help - continue to monitor leukocytosis and fever curve - Neurology will follow along, discussed with CCM at bedside  Addendum  Repeat MRI brain on my read concerning for multifocal strokes in multiple vascular distributions (radiology read pending) Highly concerning for potential endocarditis - Stroke labs HgbA1c meeting goal, fasting lipid panel ordered  - Repeat blood  cultures - CTA  - Frequent neuro checks - Echocardiogram w/ bubble, may need TEE if negative (ordered TEE as well at request of Dr. Pearlean Brownie) - Hold antiplatelets due to concern for endocarditis  - Risk factor modification - Telemetry monitoring - Blood pressure goal   - Normotension due to no clear last known well  - PT consult, OT consult, Speech consult to be ordered when patient's mental status improves  - Stroke team to follow  ______________________________________________________________________  Thank you for the opportunity to take part in the care of this patient. If you have any further questions, please contact the neurology consultation team on call. Updated oncall schedule is listed on AMION.  Signed,  Lanae Boast, AGACNP-BC [facility] Pager: [phone]  Attending Neurologist's note:  I personally saw this patient, gathering history, performing a full neurologic examination, reviewing  relevant labs, personally reviewing relevant imaging including MRI brain, and formulated the assessment and plan, adding the note above for completeness and clarity to accurately reflect my thoughts   CRITICAL CARE Performed by: Gordy Councilman   Total critical care time: 35 minutes  Critical care time was exclusive of separately billable procedures and treating other patients.  Critical care was necessary to treat or prevent imminent or life-threatening deterioration.  Critical care was time spent personally by me on the following activities: development of treatment plan with patient and/or surrogate as well as nursing, discussions with consultants, evaluation of patient's response to treatment, examination of patient, obtaining history from patient or surrogate, ordering and performing treatments and interventions, ordering and review of laboratory studies, ordering and review of radiographic studies, pulse oximetry and re-evaluation of patient's condition.

## 2023-02-17 ENCOUNTER — Inpatient Hospital Stay (HOSPITAL_COMMUNITY): Payer: 59

## 2023-02-17 DIAGNOSIS — R4182 Altered mental status, unspecified: Secondary | ICD-10-CM | POA: Diagnosis not present

## 2023-02-17 DIAGNOSIS — R400 Somnolence: Secondary | ICD-10-CM | POA: Diagnosis not present

## 2023-02-17 LAB — HEPATIC FUNCTION PANEL
ALT: 47 U/L — ABNORMAL HIGH (ref 0–44)
AST: 35 U/L (ref 15–41)
Albumin: 2.6 g/dL — ABNORMAL LOW (ref 3.5–5.0)
Alkaline Phosphatase: 123 U/L (ref 38–126)
Bilirubin, Direct: 0.5 mg/dL — ABNORMAL HIGH (ref 0.0–0.2)
Indirect Bilirubin: 0.5 mg/dL (ref 0.3–0.9)
Total Bilirubin: 1 mg/dL (ref <1.2)
Total Protein: 7.1 g/dL (ref 6.5–8.1)

## 2023-02-17 LAB — RETICULOCYTES
Immature Retic Fract: 15.8 % (ref 2.3–15.9)
RBC.: 3.52 MIL/uL — ABNORMAL LOW (ref 4.22–5.81)
Retic Count, Absolute: 87.6 10*3/uL (ref 19.0–186.0)
Retic Ct Pct: 2.5 % (ref 0.4–3.1)

## 2023-02-17 LAB — MAGNESIUM: Magnesium: 2.3 mg/dL (ref 1.7–2.4)

## 2023-02-17 LAB — CBC
HCT: 27.5 % — ABNORMAL LOW (ref 39.0–52.0)
Hemoglobin: 9.7 g/dL — ABNORMAL LOW (ref 13.0–17.0)
MCH: 27.6 pg (ref 26.0–34.0)
MCHC: 35.3 g/dL (ref 30.0–36.0)
MCV: 78.1 fL — ABNORMAL LOW (ref 80.0–100.0)
Platelets: 164 10*3/uL (ref 150–400)
RBC: 3.52 MIL/uL — ABNORMAL LOW (ref 4.22–5.81)
RDW: 15 % (ref 11.5–15.5)
WBC: 15.3 10*3/uL — ABNORMAL HIGH (ref 4.0–10.5)
nRBC: 0.4 % — ABNORMAL HIGH (ref 0.0–0.2)

## 2023-02-17 LAB — BASIC METABOLIC PANEL
Anion gap: 10 (ref 5–15)
BUN: 14 mg/dL (ref 6–20)
CO2: 26 mmol/L (ref 22–32)
Calcium: 8.7 mg/dL — ABNORMAL LOW (ref 8.9–10.3)
Chloride: 111 mmol/L (ref 98–111)
Creatinine, Ser: 0.79 mg/dL (ref 0.61–1.24)
GFR, Estimated: >60 mL/min (ref >60)
Glucose, Bld: 124 mg/dL — ABNORMAL HIGH (ref 70–99)
Potassium: 3.7 mmol/L (ref 3.5–5.1)
Sodium: 147 mmol/L — ABNORMAL HIGH (ref 135–145)

## 2023-02-17 LAB — SURGICAL PCR SCREEN
MRSA, PCR: NEGATIVE
Staphylococcus aureus: NEGATIVE

## 2023-02-17 LAB — CSF CELL COUNT WITH DIFFERENTIAL
RBC Count, CSF: 1 /mm3 — ABNORMAL HIGH
Tube #: 3
WBC, CSF: 3 /mm3 (ref 0–5)

## 2023-02-17 LAB — LIPID PANEL
Cholesterol: 111 mg/dL (ref 0–200)
HDL: 27 mg/dL — ABNORMAL LOW (ref >40)
LDL Cholesterol: 49 mg/dL (ref 0–99)
Total CHOL/HDL Ratio: 4.1 {ratio}
Triglycerides: 176 mg/dL — ABNORMAL HIGH (ref <150)
VLDL: 35 mg/dL (ref 0–40)

## 2023-02-17 LAB — GLUCOSE, CAPILLARY
Glucose-Capillary: 103 mg/dL — ABNORMAL HIGH (ref 70–99)
Glucose-Capillary: 86 mg/dL (ref 70–99)
Glucose-Capillary: 90 mg/dL (ref 70–99)
Glucose-Capillary: 87 mg/dL (ref 70–99)
Glucose-Capillary: 101 mg/dL — ABNORMAL HIGH (ref 70–99)
Glucose-Capillary: 105 mg/dL — ABNORMAL HIGH (ref 70–99)

## 2023-02-17 LAB — PROTEIN AND GLUCOSE, CSF
Glucose, CSF: 70 mg/dL (ref 40–70)
Total  Protein, CSF: 26 mg/dL (ref 15–45)

## 2023-02-17 LAB — LACTATE DEHYDROGENASE: LDH: 270 U/L — ABNORMAL HIGH (ref 98–192)

## 2023-02-17 MED ORDER — SODIUM CHLORIDE 0.9% FLUSH
10.0000 mL | Freq: Two times a day (BID) | INTRAVENOUS | Status: DC
  Administered 2023-02-17 – 2023-02-19 (×4): 10 mL via INTRAVENOUS

## 2023-02-17 MED ORDER — FUROSEMIDE 10 MG/ML IJ SOLN
40.0000 mg | Freq: Four times a day (QID) | INTRAMUSCULAR | Status: AC
  Administered 2023-02-17 (×2): 40 mg via INTRAVENOUS
  Filled 2023-02-17 (×2): qty 4

## 2023-02-17 MED ORDER — SODIUM CHLORIDE 0.9 % IV SOLN: 250.0000 mL | INTRAVENOUS | Status: DC

## 2023-02-17 MED ORDER — POTASSIUM CHLORIDE 20 MEQ PO PACK
40.0000 meq | PACK | Freq: Once | ORAL | Status: AC
  Administered 2023-02-17: 40 meq
  Filled 2023-02-17: qty 2

## 2023-02-17 MED ORDER — NICOTINE 14 MG/24HR TD PT24
14.0000 mg | MEDICATED_PATCH | Freq: Every day | TRANSDERMAL | Status: DC
  Administered 2023-02-17 – 2023-03-03 (×15): 14 mg via TRANSDERMAL
  Filled 2023-02-17 (×15): qty 1

## 2023-02-17 MED ORDER — ALBUMIN HUMAN 25 % IV SOLN
25.0000 g | Freq: Once | INTRAVENOUS | Status: AC
  Administered 2023-02-18: 12.5 g via INTRAVENOUS
  Filled 2023-02-17: qty 100

## 2023-02-17 MED ORDER — MUPIROCIN 2 % EX OINT
1.0000 | TOPICAL_OINTMENT | Freq: Two times a day (BID) | CUTANEOUS | Status: DC
  Filled 2023-02-17: qty 22

## 2023-02-17 MED ORDER — NOREPINEPHRINE 4 MG/250ML-% IV SOLN
2.0000 ug/min | INTRAVENOUS | Status: DC
  Administered 2023-02-18: 2 ug/min via INTRAVENOUS
  Filled 2023-02-17: qty 250

## 2023-02-17 MED ORDER — LIDOCAINE 1 % OPTIME INJ - NO CHARGE
5.0000 mL | Freq: Once | INTRAMUSCULAR | Status: AC
  Administered 2023-02-17: 1.5 mL via INTRADERMAL

## 2023-02-17 MED ORDER — SODIUM CHLORIDE 0.9 % IV SOLN
2.0000 g | INTRAVENOUS | Status: AC
  Administered 2023-02-17 – 2023-02-18 (×2): 2 g via INTRAVENOUS
  Filled 2023-02-17 (×2): qty 20

## 2023-02-17 NOTE — Procedures (Signed)
Cortrak  Person Inserting Tube:  Ranell Patrick D, RD Tube Type:  Cortrak - 43 inches Tube Size:  10 Tube Location:  Left nare Secured by: Bridle Technique Used to Measure Tube Placement:  Marking at nare/corner of mouth Cortrak Secured At:  79 cm Procedure Comments:  Cortrak Tube Team Note:  Consult received to place a Cortrak feeding tube.   X-ray is required, abdominal x-ray has been ordered by the Cortrak team. Please confirm tube placement before using the Cortrak tube.   If the tube becomes dislodged please keep the tube and contact the Cortrak team at [email or web address] for replacement.  If after hours and replacement cannot be delayed, place a NG tube and confirm placement with an abdominal x-ray.    Ranell Patrick, RD, LDN Clinical Dietitian RD pager # available in [place]  After hours/weekend pager # available in [facility]

## 2023-02-17 NOTE — TOC CAGE-AID Note (Signed)
Transition of Care [facility]) - CAGE-AID Screening   Patient Details  Name: Peter Kline MRN: [id number] Date of Birth: Sep 01, 1965  [facility]) CM/SW Contact:    Mearl Latin, LCSW Phone Number: 02/17/2023, 4:35 PM   Clinical Narrative: Patient currently intubated and not appropriate for screening at this time.    CAGE-AID Screening: Substance Abuse Screening unable to be completed due to: : Patient unable to participate

## 2023-02-17 NOTE — Progress Notes (Signed)
NAME:  Peter Kline, MRN:  [id number], DOB:  01-26-1966, LOS: 5 ADMISSION DATE:  02/11/2023, CONSULTATION DATE:  02/12/23 REFERRING MD:  EDP, CHIEF COMPLAINT:  acute resp failure   History of Present Illness:  57 yo male who's hpi is severely limited 2/2 acute encephalopathy and resp failure now on ventilator and sedation. Pt was reportedly found wandering the streets of [place] altered. Pt was brought in by EMS with slurred speech and confusion. He was orginally evaluated as code stroke that was negative. Pt's mental status failed to improve and agitation was difficult to allow for evaluation and work up so he was subsequently intubated for airway protection.  PMH sickle cell, chronic pain, retinal hemorrhage, tobacco use, MJ use, Vit D def, Priapism, GERD   Imaging noted multifocal pna, leukocytosis and metabolic acidosis  Ccm was asked to admit to ICU  Pertinent  Medical History  Sickle cell, chronic pain, retinal hemorrhage, tobacco use, MJ use, Vit D def, Priapism, GERD  Significant Hospital Events: Including procedures, antibiotic start and stop dates in addition to other pertinent events   Ad[facility] to [facility] 10/30, LP attempted, MRI  10/31 cLTM 11/1 small aspiration event from emesis, increased secretions, neuro consulted 11/2 agitation followed by aspiration event and evening  Interim History / Subjective:  Intubated, sedated. Fiance at bedside.  Objective   Blood pressure 139/88, pulse 62, temperature 99.1 F (37.3 C), resp. rate 12, height 6' 0.01" (1.829 m), weight 72.3 kg, SpO2 98%.    Vent Mode: PRVC FiO2 (%):  [30 %] 30 % Set Rate:  [18 bmp] 18 bmp Vt Set:  [620 mL] 620 mL PEEP:  [5 cmH20] 5 cmH20 Plateau Pressure:  [21 cmH20-26 cmH20] 26 cmH20   Intake/Output Summary (Last 24 hours) at 02/17/2023 0705 Last data filed at 02/17/2023 0600 Gross per 24 hour  Intake 2521.24 ml  Output 2250 ml  Net 271.24 ml   Filed Weights   02/15/23 0704 02/16/23 0704  02/17/23 0500  Weight: 75.1 kg 75.1 kg 72.3 kg   Examination:  No distress, RASS -4 L pupil 5mm, R 4mm, both reactive Disconjugate gaze Passive on vent Vent mechanics okay, lungs diminished bases Trace anasarca Heart sounds regular, sinus on monitor Abd firm, hypoactive but present BS Foley in place  Patient Lines/Drains/Airways Status     Active Line/Drains/Airways     Name Placement date Placement time Site Days   Peripheral IV 02/11/23 18 G 1" Anterior;Distal;Left;Upper Arm 02/11/23  1926  Arm  6   Peripheral IV 02/11/23 20 G 1" Anterior;Right Forearm 02/11/23  2138  Forearm  6   Peripheral IV 02/12/23 20 G 1" Right Antecubital 02/12/23  0101  Antecubital  5   Peripheral IV 02/12/23 22 G 1.75" Anterior;Left Forearm 02/12/23  1139  Forearm  5   NG/OG Vented/Dual Lumen 18 Fr. Oral 02/12/23  0048  Oral  5   Urethral Catheter UTA - present on admission Straight-tip 02/12/23  0800  Straight-tip  5   Airway 7.5 mm 02/12/23  0041  -- 5           BMP mild hypernatremia and hypokalemia Improving WBC CBG ok No new imaging  MRI query embolic small strokes TTE noted  Resolved Hospital Problem list     Assessment & Plan:  Acute encephalopathy- multifactorial in setting of alcohol abuse, query w/d, query hepatic encephalopathy, and query effect of multiple small strokes.  Sedation and vent weans complicated by secretions burden and continued AMS. 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 concerning for embolic phenomena, I guess could be sickling Acute hypoxemic respiratory failure- after aspiration event; less c/w acute chest Hx sickle cell disease -(associated retinopathy, chronic pain, priapism, splenic sequestration)  Hx GERD  - Receiving empiric thiamine, lactulose - Will need TEE, SAT/SBT thereafter; if continued agitation, neuro recommends considering valproate/gabapentin/clonidine; precedex may be good bridge as well although may run into issues with HR  (currently in 60s) - Not sure utility of repeat LP, defer to neurology - Check HgbS, hemolysis markers - lasix  x 2 - unasyn to ceftriaxone x 2 more days - H2b, vent/PAD bundle  Best Practice (right click and "Reselect all SmartList Selections" daily)   Diet/type: TF per RD DVT prophylaxis: lovenox GI prophylaxis: H2B Lines: N/A Foley:  Yes, and it is still needed Code Status:  full code Last date of multidisciplinary goals of care discussion [pending]   SO updated at bedside 11/4.  32 min cc time Myrla Halsted MD PCCM

## 2023-02-17 NOTE — Procedures (Signed)
Fluoroscopically-guided L4-L5 lumbar puncture.  12.5 mL of CSF collected and sent for laboratory studies.  No immediate post-procedure complication.

## 2023-02-17 NOTE — Progress Notes (Addendum)
STROKE TEAM PROGRESS NOTE   BRIEF HPI Mr. DOMINGOS RIGGI is a  57 y.o. male  has a past medical history of Shortness of breath and Sickle cell anemia (HCC). who presented was found wandering the streets confused and got intubated for persistent agitation. Etiology is unclear. MRI brain, cEEG unrevealing, failed LP attempts, being treated for aspiration pneumonia. Difficulty weaning off sedation due to agitaition and vent dyssnychrony.   SIGNIFICANT HOSPITAL EVENTS 11/3 Repeat MRI showed scattered new small acute infarcts bilateral cerebral and cerebellar. CTA negative.   INTERIM HISTORY/SUBJECTIVE Family and RN at bedside.  Currently intubated and sedated, propofol @ 30, fentanyl @ 100. BUE sight withdraws to pain:  LUE withdraws more than RUE. Opens eyes to noxious while on sedation.   TEE planned for 11/5.  OBJECTIVE  CBC    Component Value Date/Time   WBC 15.3 (H) 02/17/2023 0430   RBC 3.52 (L) 02/17/2023 0851   RBC 3.52 (L) 02/17/2023 0430   HGB 9.7 (L) 02/17/2023 0430   HGB 13.6 11/25/2018 1534   HCT 27.5 (L) 02/17/2023 0430   HCT 41.1 11/25/2018 1534   PLT 164 02/17/2023 0430   PLT 198 11/25/2018 1534   MCV 78.1 (L) 02/17/2023 0430   MCV 87 11/25/2018 1534   MCH 27.6 02/17/2023 0430   MCHC 35.3 02/17/2023 0430   RDW 15.0 02/17/2023 0430   RDW 15.6 (H) 11/25/2018 1534   LYMPHSABS 1.5 02/11/2023 2007   LYMPHSABS 2.7 09/03/2017 1402   MONOABS 2.3 (H) 02/11/2023 2007   EOSABS 0.1 02/11/2023 2007   EOSABS 0.4 09/03/2017 1402   BASOSABS 0.1 02/11/2023 2007   BASOSABS 0.1 09/03/2017 1402    BMET    Component Value Date/Time   NA 147 (H) 02/17/2023 0430   NA 137 11/25/2018 1534   K 3.7 02/17/2023 0430   CL 111 02/17/2023 0430   CO2 26 02/17/2023 0430   GLUCOSE 124 (H) 02/17/2023 0430   BUN 14 02/17/2023 0430   BUN 7 11/25/2018 1534   CREATININE 0.79 02/17/2023 0430   CREATININE 0.92 05/02/2014 1430   CALCIUM 8.7 (L) 02/17/2023 0430   GFRNONAA >60 02/17/2023  0430   GFRNONAA >89 05/02/2014 1430    IMAGING past 24 hours CT ANGIO HEAD NECK W WO CM  Result Date: 02/16/2023 CLINICAL DATA:  Follow-up examination for stroke. EXAM: CT ANGIOGRAPHY HEAD AND NECK WITH AND WITHOUT CONTRAST TECHNIQUE: Multidetector CT imaging of the head and neck was performed using the standard protocol during bolus administration of intravenous contrast. Multiplanar CT image reconstructions and MIPs were obtained to evaluate the vascular anatomy. Carotid stenosis measurements (when applicable) are obtained utilizing NASCET criteria, using the distal internal carotid diameter as the denominator. RADIATION DOSE REDUCTION: This exam was performed according to the departmental dose-optimization program which includes automated exposure control, adjustment of the mA and/or kV according to patient size and/or use of iterative reconstruction technique. CONTRAST:  75mL OMNIPAQUE IOHEXOL 350 MG/ML SOLN COMPARISON:  Prior brain MRI from earlier the same day. FINDINGS: CT HEAD FINDINGS Brain: Cerebral volume within normal limits. Previously identified scattered small volume infarcts involving the bilateral cerebral and cerebellar hemispheres are not well visualized by CT. No other acute large vessel territory infarct. No acute intracranial hemorrhage. No mass lesion or midline shift. No hydrocephalus or extra-axial fluid collection. Vascular: No abnormal hyperdense vessel. Skull: Scalp soft tissues demonstrate no acute finding. Calvarium intact. Sinuses/Orbits: Globes and orbital soft tissues within normal limits. Moderate mucosal thickening present throughout the  sphenoid ethmoidal and left maxillary sinus. No significant mastoid effusion. Patient is intubated. Other: None. Review of the MIP images confirms the above findings CTA NECK FINDINGS Aortic arch: Visualized aortic arch within normal limits for caliber with standard branch pattern. No stenosis about the origin the great vessels. Right  carotid system: Right common and internal carotid arteries are patent without dissection. Minimal plaque about the right carotid bulb without stenosis. Left carotid system: Left common and internal carotid arteries are patent without dissection. Minimal plaque about the left carotid bulb without stenosis. Vertebral arteries: Both vertebral arteries arise from subclavian arteries. No proximal subclavian artery stenosis. Vertebral arteries are patent without stenosis or dissection. Left vertebral artery slightly dominant. Skeleton: No discrete or worrisome osseous lesions. Other neck: Endotracheal and enteric tubes in place. Prominent left posterior chain lymph nodes noted, largest of which measures 1.5 cm (series 13, image 109), indeterminate. No other acute abnormality. Upper chest: Scattered patchy opacities within the partially visualized left lung, suspicious for pneumonia. Mildly prominent mediastinal lymph nodes measure up to 1.2 cm. Review of the MIP images confirms the above findings CTA HEAD FINDINGS Anterior circulation: Mild atheromatous change about the carotid siphons without hemodynamically significant stenosis. A1 segments patent bilaterally. Normal anterior communicating artery complex. Anterior cerebral arteries patent without stenosis. No M1 stenosis or occlusion. No proximal MCA branch occlusion or high-grade stenosis. Distal MCA branches perfused and symmetric. Posterior circulation: Both V4 segments patent without stenosis. Both PICA grossly patent at their origins. Basilar patent without stenosis. Superior cerebellar and posterior cerebral arteries patent bilaterally. Venous sinuses: Grossly patent allowing for timing the contrast bolus. Anatomic variants: None significant.  No aneurysm. Review of the MIP images confirms the above findings IMPRESSION: CT HEAD: 1. Previously identified scattered small volume infarcts involving the bilateral cerebral and cerebellar hemispheres not well visualized  by CT. 2. No other acute intracranial abnormality. CTA HEAD AND NECK: 1. Negative CTA for large vessel occlusion or other emergent finding. 2. Mild for age atheromatous change about the carotid bifurcations and carotid siphons without hemodynamically significant stenosis. 3. Scattered patchy opacities within the partially visualized left lung, suspicious for pneumonia. 4. Mildly enlarged mediastinal and left posterior chain lymph nodes, indeterminate, but could be reactive. Electronically Signed   By: Rise Mu M.D.   On: 02/16/2023 22:53   ECHOCARDIOGRAM COMPLETE  Result Date: 02/16/2023    ECHOCARDIOGRAM REPORT   Patient Name:   DUTCH ING Date of Exam: 02/16/2023 Medical Rec #:  [id number]       Height:       72.0 in Accession #:    [id number]      Weight:       165.6 lb Date of Birth:  1965-07-29       BSA:          1.966 m Patient Age:    57 years        BP:           116/74 mmHg Patient Gender: M               HR:           65 bpm. Exam Location:  Inpatient Procedure: 2D Echo, Color Doppler and Cardiac Doppler STAT ECHO Indications:    stroke  History:        Patient has no prior history of Echocardiogram examinations.                 Sepsis.; Signs/Symptoms:Altered Mental Status.  Sonographer:    Delcie Roch RDCS Referring Phys: [id number] SRISHTI L BHAGAT  Sonographer Comments: Echo performed with patient supine and on artificial respirator. IMPRESSIONS  1. Left ventricular ejection fraction, by estimation, is 55 to 60%. The left ventricle has normal function. The left ventricle has no regional wall motion abnormalities. Left ventricular diastolic parameters are consistent with Grade II diastolic dysfunction (pseudonormalization).  2. RV-RA gradient 33 mmHg suggesting at least mild to moderate increase in RVSP depending on CVP. Right ventricular systolic function is normal. The right ventricular size is normal.  3. Left atrial size was moderately dilated.  4. The mitral valve is  abnormal, mildly calcified with somewhat restricted posterior leaflet. Mild to moderate mitral valve regurgitation.  5. The aortic valve is tricuspid. Aortic valve regurgitation is not visualized.  6. The inferior vena cava is normal in size with <50% respiratory variability, suggesting right atrial pressure of 8 mmHg. Comparison(s): No prior Echocardiogram. FINDINGS  Left Ventricle: Left ventricular ejection fraction, by estimation, is 55 to 60%. The left ventricle has normal function. The left ventricle has no regional wall motion abnormalities. The left ventricular internal cavity size was normal in size. There is  no left ventricular hypertrophy. Left ventricular diastolic parameters are consistent with Grade II diastolic dysfunction (pseudonormalization). Right Ventricle: RV-RA gradient 33 mmHg suggesting at least mild to moderate increase in RVSP depending on CVP. The right ventricular size is normal. No increase in right ventricular wall thickness. Right ventricular systolic function is normal. Left Atrium: Left atrial size was moderately dilated. Right Atrium: Right atrial size was normal in size. Pericardium: There is no evidence of pericardial effusion. Mitral Valve: The mitral valve is abnormal. There is mild calcification of the mitral valve leaflet(s). Mild to moderate mitral valve regurgitation. Tricuspid Valve: The tricuspid valve is grossly normal. Tricuspid valve regurgitation is mild. Aortic Valve: The aortic valve is tricuspid. There is mild aortic valve annular calcification. Aortic valve regurgitation is not visualized. Pulmonic Valve: The pulmonic valve was grossly normal. Pulmonic valve regurgitation is trivial. Aorta: The aortic root and ascending aorta are structurally normal, with no evidence of dilitation. Venous: IVC assessment for right atrial pressure unable to be performed due to mechanical ventilation. The inferior vena cava is normal in size with less than 50% respiratory  variability, suggesting right atrial pressure of 8 mmHg. IAS/Shunts: No atrial level shunt detected by color flow Doppler.  LEFT VENTRICLE PLAX 2D LVIDd:         5.60 cm   Diastology LVIDs:         3.40 cm   LV e' medial:    7.94 cm/s LV PW:         1.00 cm   LV E/e' medial:  9.5 LV IVS:        1.00 cm   LV e' lateral:   12.40 cm/s LVOT diam:     2.30 cm   LV E/e' lateral: 6.1 LV SV:         91 LV SV Index:   46 LVOT Area:     4.15 cm  RIGHT VENTRICLE             IVC RV Basal diam:  2.80 cm     IVC diam: 2.00 cm RV S prime:     13.70 cm/s TAPSE (M-mode): 2.7 cm LEFT ATRIUM             Index        RIGHT ATRIUM  Index LA diam:        4.30 cm 2.19 cm/m   RA Area:     17.20 cm LA Vol (A2C):   95.0 ml 48.32 ml/m  RA Volume:   49.40 ml  25.13 ml/m LA Vol (A4C):   83.7 ml 42.57 ml/m LA Biplane Vol: 91.9 ml 46.74 ml/m  AORTIC VALVE LVOT Vmax:   101.00 cm/s LVOT Vmean:  66.900 cm/s LVOT VTI:    0.220 m  AORTA Ao Root diam: 3.10 cm Ao Asc diam:  3.50 cm MITRAL VALVE                  TRICUSPID VALVE MV Area (PHT): 4.15 cm       TR Peak grad:   32.7 mmHg MV Decel Time: 183 msec       TR Vmax:        286.00 cm/s MR Peak grad:    109.4 mmHg MR Mean grad:    76.0 mmHg    SHUNTS MR Vmax:         523.00 cm/s  Systemic VTI:  0.22 m MR Vmean:        419.0 cm/s   Systemic Diam: 2.30 cm MR PISA:         1.01 cm MR PISA Eff ROA: 8 mm MR PISA Radius:  0.40 cm MV E velocity: 75.40 cm/s MV A velocity: 65.40 cm/s MV E/A ratio:  1.15 Nona Dell MD Electronically signed by Nona Dell MD Signature Date/Time: 02/16/2023/2:44:07 PM    Final    MR BRAIN W WO CONTRAST  Result Date: 02/16/2023 CLINICAL DATA:  Altered mental status EXAM: MRI HEAD WITHOUT AND WITH CONTRAST TECHNIQUE: Multiplanar, multiecho pulse sequences of the brain and surrounding structures were obtained without and with intravenous contrast. CONTRAST:  7mL GADAVIST GADOBUTROL 1 MMOL/ML IV SOLN COMPARISON:  Brain MRI 02/13/2023 FINDINGS: Brain:  Again seen is small early subacute infarct in the left occipital periventricular white matter (2-23), unchanged. There are scattered new small acute infarcts in the bilateral cerebral and cerebellar hemispheres, the largest in the left corona radiata/centrum semiovale. There is no associated hemorrhage or mass effect. Findings are favored embolic in etiology. There is no acute intracranial hemorrhage or extra-axial fluid collection. Parenchymal volume is stable. The ventricles are stable in size. A small focus of encephalomalacia in the left frontal lobe, small remote cerebellar infarcts, and small remote infarct in the left pons are unchanged. Parenchymal signal is otherwise essentially normal. The pituitary and suprasellar region are normal. There is no mass lesion or abnormal enhancement. There is no mass effect or midline shift. Vascular: Normal flow voids. Skull and upper cervical spine: Normal marrow signal. Sinuses/Orbits: There is moderate mucosal thickening throughout the paranasal sinuses. The globes and orbits are unremarkable. Other: A midline nasopharyngeal Tornwaldt cyst is noted. IMPRESSION: Scattered small new acute infarcts in the bilateral cerebral and cerebellar hemispheres, favored embolic in etiology. Electronically Signed   By: Lesia Hausen M.D.   On: 02/16/2023 14:12    Vitals:   02/17/23 0700 02/17/23 0726 02/17/23 0800 02/17/23 0900  BP: 130/79  131/88 111/70  Pulse: 63  88 73  Resp: 17 18 18 18   Temp: 99.1 F (37.3 C)  99.5 F (37.5 C) 100 F (37.8 C)  TempSrc:      SpO2: 98%  96% 95%  Weight:      Height:         General: Critically ill appearing African American male laying  in ICU bed, in no acute distress this morning with sedation briefly paused for exam. Remains in bilateral soft wrist restraints for agitation episodes overnight. No overt signs of meningismus on examination.  HENT: ETT remains in place with securement device.  Head: Normocephalic Cardiovascular:  Normal rate and regular rhythm.  Respiratory: Respirations assisted via mechanical ventilator, patient initiates spontaneous respirations on vent. No respiratory distress noted. GI: Soft.  No distension. There is no tenderness.  Skin: WDI.    NEURO Mental status/Cognition: Spontaneous, partial eye opening throughout exam.  Does not follow commands.   Speech/language: mute, ETT, no attempts to communicate. Cranial nerves:   CN II Ovoid right pupil > round left pupil, both sluggishly reactive to light   CN III,IV,VI Right gaze preference, dolls eyes reflex is intact, does not fixate or track.    CN V Corneals intact BL to eyelash brush, does not BTT on the left, BTT intact on thr right.   CN VII Facial symmetry obscured due to ETT and securement device, within that limitation, no overt facial asymmetry noted   CN VIII Unable to assess due to patient's condition   CN IX & X Cough intact, gag not assessed   CN XI Nare stimulation not completed today   CN XII Does not protrude tongue to command    Sensory/Motor:  Withdraws slightly in BUE, left more than right.  In bilateral soft wrist restraints but no response to noxious stimuli. Bedside RN reports strong agitated movements throughout when sedation paused without asymmetry or noted weakness.  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 ASPECTS 10.     10/31 MRI   No evidence of intracranial infection.  Left Occipital white matter acute infarct Small chronic infratentorial infarcts including left pons Left frontal lobe encephalomalacia 11/3 MRI Scattered new small acute infarcts bilateral cerebral and cerebellar hemispheres.   2D Echo: EF 55-60%, Grade II diastolic dysfunction, Moderately dilated left ateria, Mild to Moderate  MVR TEE: PENDING, scheduled for tomorrow.   LDL 49 HgbA1c 4.8 VTE prophylaxis - lovenox No antithrombotic prior to admission, now on No antithrombotic due to concern for endocarditis.   Therapy recommendations:  Pending Disposition:  pending  Acute Encephalopathy Secondary to alcohol abuse, hyperammonemia CIWA protocol in place Rcvd lactulose Currently intubated and sedated d/t increased agitation and continued AMS  Acute Respiratory Failure ?Aspiration Pneumonia Requiring mechanical ventilation Likely d/t aspiration VAP bundle Wean sedation as able On Unasyn  Hx of Hypertension Home meds:  none Stable, on lower end BP goal normotensive, as out of permissive hypertension window due to unclear LKW  Hyperlipidemia Home meds:  none LDL 49, goal < 70 High intensity statin not indicated due to LDL within goal   Diabetes type II, no history Home meds:  none HgbA1c 4.8, goal < 7.0 CBGs SSI  Tobacco Abuse Current smoker      Ready to quit? N/A Nicotine patch ordered   Substance Abuse Patient uses marijuana UDS positive for  THC      Ready to quit? N/A  Dysphagia Patient has post-stroke dysphagia, SLP consulted    Diet   Diet NPO time specified   Advance diet as tolerated  Other Stroke Risk Factors ETOH abuse, alcohol level <10, advised to drink no more than 1-2 drink(s) a day Hx of Sickle Cell disease  Other  Active Problems, per primary team GERD Fever, tMax 101.3 WBC 21.6 -> 17.6 -> 21.0 -> 14.6 -> 17.4 -> 15.3 Currently on Unasyn Anemia, Hbg 9.7 Hypernatremia, Na 138->141->145->147 Fee Water flushes Q4H  Hospital day # 5   Pt seen by Neuro NP/APP and later by MD. Note/plan to be edited by MD as needed.    Lynnae January, DNP, AGACNP-BC [facility] Please use AMION for contact information & EPIC for messaging.  STROKE MD NOTE :  I have personally obtained history,examined this patient, reviewed notes, independently viewed  imaging studies, participated in medical decision making and plan of care.ROS completed by me personally and pertinent positives fully documented  I have made any additions or clarifications directly to the above note. Agree with note above.  Patient presented with altered mental status and confusion and required sedation for agitation and eventually requiring intubation.  Initial MRI scan showed small periventricular lacunar strokes the patient was treated with antibiotics but spinal tap attempts were not successful even with fluoroscopy.  Repeat MRI scan shows additional 2 punctate cerebellar strokes as well raising concern for possible cardiac etiology.  Profound degree of altered mental status from his 2 small initial periventricular stroke is unusual and difficult to explain.  EEG monitoring did not show seizure activity and metabolic parameters are mostly unremarkable except for slightly elevated ammonia.  Recommend send more detailed toxicology screen and attempt spinal tap however low suspicion for meningitis given lack of meningeal enhancement on MRI or significant signs of meningismus.  Long discussion at the bedside with the patient's uncle and fianc about his prognosis and evaluation and treatment plan and answered questions.  Discussed with Dr. Katrinka Blazing critical care medicine This patient is critically ill and at significant risk of neurological worsening, death and care requires constant monitoring of vital signs, hemodynamics,respiratory and cardiac monitoring, extensive review of multiple databases, frequent neurological assessment, discussion with family, other specialists and medical decision making of high complexity.I have made any additions or clarifications directly to the above note.This critical care time does not reflect procedure time, or teaching time or supervisory time of PA/NP/Med Resident etc but could involve care discussion time.  I spent 30 minutes of neurocritical care time  in the  care of  this patient.     Delia Heady, MD Medical Director [facility] Pager: [phone] 02/17/2023 2:24 PM   To contact Stroke Continuity provider, please refer to [email or web address]. After hours, contact General Neurology

## 2023-02-17 NOTE — Progress Notes (Addendum)
eLink Physician-Brief Progress Note Patient Name: Peter Kline DOB: 1965-05-04 MRN: [id number]   Date of Service  02/17/2023  HPI/Events of Note  Anticipated TEE in the morning.  Tube feeds been held since LP earlier today.  eICU Interventions  Continue to hold tube feeds in anticipation of procedure in the morning.   2323 -continue restraints as needed  2350 -patient is now febrile to 38, hypotensive with maps in low 60s.  No central access.  Diuresed 2.6 L today, net positive for the stay overall.  Initiate norepinephrine peripherally as needed.  Add albumin bolus.  Intervention Category Minor Interventions: Routine modifications to care plan (e.g. PRN medications for pain, fever)  Sherley Mckenney 02/17/2023, 7:53 PM

## 2023-02-17 NOTE — Progress Notes (Signed)
RT transported pt on ventilator to IR and back without any complications. RN at bedside.

## 2023-02-17 NOTE — Progress Notes (Signed)
    [facility] has been requested to perform a transesophageal echocardiogram on Burke Keels for evaluation of possible endocarditis/cardioembolic source with patient having acute ischemic infarct, bilateral cerebral and cerebellar hemispheres.  Patient is intubated and sedated. After careful review of history and examination, the risks and benefits of transesophageal echocardiogram have been explained to patient's uncle Elgie Congo via phone, including risks of esophageal damage, perforation (1:10,000 risk), bleeding, pharyngeal hematoma as well as other potential complications associated with conscious sedation including aspiration, arrhythmia, respiratory failure and death. Alternatives to treatment were discussed, questions were answered. Patient's uncle is willing to consent/proceed. Consent confirmed independently by my colleague Dayna Dunn PA-C.  Perlie Gold PA-C 02/17/2023 4:02 PM

## 2023-02-17 NOTE — Plan of Care (Signed)
  Problem: Nutritional: Goal: Maintenance of adequate nutrition will improve Outcome: Progressing   Problem: Skin Integrity: Goal: Risk for impaired skin integrity will decrease Outcome: Progressing   Problem: Tissue Perfusion: Goal: Adequacy of tissue perfusion will improve Outcome: Progressing   Problem: Clinical Measurements: Goal: Will remain free from infection Outcome: Progressing Goal: Diagnostic test results will improve Outcome: Progressing Goal: Cardiovascular complication will be avoided Outcome: Progressing   Problem: Safety: Goal: Non-violent Restraint(s) Outcome: Not Progressing   Problem: Clinical Measurements: Goal: Respiratory complications will improve Outcome: Not Progressing   Problem: Activity: Goal: Risk for activity intolerance will decrease Outcome: Not Progressing   Problem: Nutrition: Goal: Adequate nutrition will be maintained Outcome: Not Progressing   Problem: Education: Goal: Knowledge of disease or condition will improve Outcome: Not Progressing Goal: Knowledge of secondary prevention will improve (MUST DOCUMENT ALL) Outcome: Not Progressing Goal: Knowledge of patient specific risk factors will improve [name] N/A or DELETE if not current risk factor) Outcome: Not Progressing   Problem: Ischemic Stroke/TIA Tissue Perfusion: Goal: Complications of ischemic stroke/TIA will be minimized Outcome: Not Progressing

## 2023-02-18 ENCOUNTER — Inpatient Hospital Stay (HOSPITAL_COMMUNITY): Payer: 59

## 2023-02-18 DIAGNOSIS — I6300 Cerebral infarction due to thrombosis of unspecified precerebral artery: Secondary | ICD-10-CM

## 2023-02-18 DIAGNOSIS — I639 Cerebral infarction, unspecified: Secondary | ICD-10-CM

## 2023-02-18 DIAGNOSIS — I38 Endocarditis, valve unspecified: Secondary | ICD-10-CM

## 2023-02-18 DIAGNOSIS — R400 Somnolence: Secondary | ICD-10-CM | POA: Diagnosis not present

## 2023-02-18 DIAGNOSIS — R4182 Altered mental status, unspecified: Secondary | ICD-10-CM | POA: Diagnosis not present

## 2023-02-18 HISTORY — DX: Cerebral infarction due to thrombosis of unspecified precerebral artery: I63.00

## 2023-02-18 LAB — GLUCOSE, CAPILLARY
Glucose-Capillary: 104 mg/dL — ABNORMAL HIGH (ref 70–99)
Glucose-Capillary: 73 mg/dL (ref 70–99)
Glucose-Capillary: 107 mg/dL — ABNORMAL HIGH (ref 70–99)
Glucose-Capillary: 74 mg/dL (ref 70–99)
Glucose-Capillary: 64 mg/dL — ABNORMAL LOW (ref 70–99)
Glucose-Capillary: 85 mg/dL (ref 70–99)
Glucose-Capillary: 92 mg/dL (ref 70–99)
Glucose-Capillary: 93 mg/dL (ref 70–99)

## 2023-02-18 LAB — BASIC METABOLIC PANEL
Anion gap: 15 (ref 5–15)
BUN: 18 mg/dL (ref 6–20)
CO2: 24 mmol/L (ref 22–32)
Calcium: 8.7 mg/dL — ABNORMAL LOW (ref 8.9–10.3)
Chloride: 108 mmol/L (ref 98–111)
Creatinine, Ser: 0.89 mg/dL (ref 0.61–1.24)
GFR, Estimated: >60 mL/min (ref >60)
Glucose, Bld: 94 mg/dL (ref 70–99)
Potassium: 3.8 mmol/L (ref 3.5–5.1)
Sodium: 147 mmol/L — ABNORMAL HIGH (ref 135–145)

## 2023-02-18 LAB — CBC
HCT: 29.5 % — ABNORMAL LOW (ref 39.0–52.0)
Hemoglobin: 10.4 g/dL — ABNORMAL LOW (ref 13.0–17.0)
MCH: 28 pg (ref 26.0–34.0)
MCHC: 35.3 g/dL (ref 30.0–36.0)
MCV: 79.5 fL — ABNORMAL LOW (ref 80.0–100.0)
Platelets: 220 10*3/uL (ref 150–400)
RBC: 3.71 MIL/uL — ABNORMAL LOW (ref 4.22–5.81)
RDW: 15 % (ref 11.5–15.5)
WBC: 19 10*3/uL — ABNORMAL HIGH (ref 4.0–10.5)
nRBC: 0.4 % — ABNORMAL HIGH (ref 0.0–0.2)

## 2023-02-18 LAB — PROCALCITONIN: Procalcitonin: 0.47 ng/mL

## 2023-02-18 LAB — ECHO TEE

## 2023-02-18 LAB — MAGNESIUM: Magnesium: 2.5 mg/dL — ABNORMAL HIGH (ref 1.7–2.4)

## 2023-02-18 LAB — SICKLE CELL SCREEN: Sickle Cell Screen: POSITIVE — AB

## 2023-02-18 LAB — CYTOLOGY - NON PAP

## 2023-02-18 MED ORDER — DEXTROSE 50 % IV SOLN
INTRAVENOUS | Status: AC
  Administered 2023-02-18: 12.5 g via INTRAVENOUS
  Filled 2023-02-18: qty 50

## 2023-02-18 MED ORDER — ACETAMINOPHEN 10 MG/ML IV SOLN
1000.0000 mg | Freq: Four times a day (QID) | INTRAVENOUS | Status: AC
  Administered 2023-02-18 – 2023-02-19 (×4): 1000 mg via INTRAVENOUS
  Filled 2023-02-18 (×4): qty 100

## 2023-02-18 MED ORDER — ACETAMINOPHEN 650 MG RE SUPP: 650.0000 mg | RECTAL | Status: DC

## 2023-02-18 MED ORDER — ACETAMINOPHEN 160 MG/5ML PO SOLN: 650.0000 mg | ORAL | Status: DC

## 2023-02-18 MED ORDER — LACTULOSE 10 GM/15ML PO SOLN
30.0000 g | Freq: Three times a day (TID) | ORAL | Status: DC
  Administered 2023-02-18 – 2023-02-19 (×2): 30 g
  Filled 2023-02-18 (×2): qty 45

## 2023-02-18 MED ORDER — FREE WATER
200.0000 mL | Freq: Four times a day (QID) | Status: DC
  Administered 2023-02-18 – 2023-02-20 (×5): 200 mL

## 2023-02-18 MED ORDER — PIPERACILLIN-TAZOBACTAM 3.375 G IVPB
3.3750 g | Freq: Three times a day (TID) | INTRAVENOUS | Status: DC
  Administered 2023-02-18 – 2023-02-20 (×6): 3.375 g via INTRAVENOUS
  Filled 2023-02-18 (×7): qty 50

## 2023-02-18 MED ORDER — DEXTROSE 50 % IV SOLN: 12.5000 g | INTRAVENOUS | Status: AC

## 2023-02-18 MED ORDER — BUSPIRONE HCL 10 MG PO TABS
30.0000 mg | ORAL_TABLET | Freq: Three times a day (TID) | ORAL | Status: DC
  Administered 2023-02-18 – 2023-02-24 (×16): 30 mg
  Filled 2023-02-18: qty 3
  Filled 2023-02-18: qty 2
  Filled 2023-02-18: qty 3
  Filled 2023-02-18: qty 2
  Filled 2023-02-18 (×2): qty 3
  Filled 2023-02-18: qty 2
  Filled 2023-02-18: qty 3
  Filled 2023-02-18: qty 2
  Filled 2023-02-18: qty 3
  Filled 2023-02-18: qty 2
  Filled 2023-02-18 (×4): qty 3

## 2023-02-18 MED ORDER — BUSPIRONE HCL 10 MG PO TABS
30.0000 mg | ORAL_TABLET | Freq: Three times a day (TID) | ORAL | Status: DC
Start: 2023-02-18 — End: 2023-03-01
  Administered 2023-02-23 – 2023-02-28 (×6): 30 mg via ORAL
  Filled 2023-02-18 (×15): qty 3

## 2023-02-18 MED ORDER — POTASSIUM CHLORIDE CRYS ER 20 MEQ PO TBCR
40.0000 meq | EXTENDED_RELEASE_TABLET | Freq: Once | ORAL | Status: AC
  Administered 2023-02-18: 40 meq via ORAL

## 2023-02-18 MED ORDER — SODIUM CHLORIDE 0.9% FLUSH
10.0000 mL | Freq: Two times a day (BID) | INTRAVENOUS | Status: DC
  Administered 2023-02-18 – 2023-03-03 (×23): 10 mL via INTRAVENOUS

## 2023-02-18 NOTE — H&P (View-Only) (Signed)
NAME:  GRAYER SPROLES, MRN:  [id number], DOB:  Oct 17, 1965, LOS: 6 ADMISSION DATE:  02/11/2023, CONSULTATION DATE:  02/12/23 REFERRING MD:  EDP, CHIEF COMPLAINT:  acute resp failure   History of Present Illness:  57 yo male who's hpi is severely limited 2/2 acute encephalopathy and resp failure now on ventilator and sedation. Pt was reportedly found wandering the streets of [place] altered. Pt was brought in by EMS with slurred speech and confusion. He was orginally evaluated as code stroke that was negative. Pt's mental status failed to improve and agitation was difficult to allow for evaluation and work up so he was subsequently intubated for airway protection.  PMH sickle cell, chronic pain, retinal hemorrhage, tobacco use, MJ use, Vit D def, Priapism, GERD   Imaging noted multifocal pna, leukocytosis and metabolic acidosis  Ccm was asked to admit to ICU  Pertinent  Medical History  Sickle cell, chronic pain, retinal hemorrhage, tobacco use, MJ use, Vit D def, Priapism, GERD  Significant Hospital Events: Including procedures, antibiotic start and stop dates in addition to other pertinent events   Admitted to [facility] 10/30, LP attempted, MRI  10/31 cLTM 11/1 small aspiration event from emesis, increased secretions, neuro consulted 11/2 agitation followed by aspiration event and evening  Interim History / Subjective:  LP neg Intubated on vent Low grade fever For TEE today  Objective   Blood pressure (!) 90/51, pulse 80, temperature (!) 101.1 F (38.4 C), resp. rate 18, height 6' 0.01" (1.829 m), weight 71.3 kg, SpO2 99%.    Vent Mode: PRVC FiO2 (%):  [30 %] 30 % Set Rate:  [18 bmp] 18 bmp Vt Set:  [620 mL] 620 mL PEEP:  [5 cmH20] 5 cmH20 Plateau Pressure:  [19 cmH20-25 cmH20] 22 cmH20   Intake/Output Summary (Last 24 hours) at 02/18/2023 0706 Last data filed at 02/18/2023 0600 Gross per 24 hour  Intake 2430.43 ml  Output 4140 ml  Net -1709.57 ml   Filed Weights    02/16/23 0704 02/17/23 0500 02/18/23 0444  Weight: 75.1 kg 72.3 kg 71.3 kg   Examination: Stable disconjugate gaze Abnormal ovoid R pupils stable Not tracking or following commands Anasarca improved Heart sounds regular Abd soft hypoactive BS, cortrak in place  LP neg Retic WNL Other labs pending  Resolved Hospital Problem list     Assessment & Plan:  Acute encephalopathy- multifactorial in setting of alcohol abuse, query w/d, query hepatic encephalopathy, and query effect of multiple small strokes.  Sedation and vent weans complicated by secretions burden and continued AMS.  Some unusual exposure with recent sewage cleanup in backyard (?) Multiple embolic CVAs- noted on sequential MRI concerning for embolic phenomena, vessels not c/w sickling events; does not explain degree of encephalopathy Acute hypoxemic respiratory failure- after aspiration event; less c/w acute chest; ongoing high secretion burden Hx sickle cell disease -(associated retinopathy, chronic pain, priapism, splenic sequestration): no e/o flare Hx GERD  - Receiving empiric thiamine, lactulose, continue - TEE today then wean sedation to see where we stand; secretion burden and cough strength may be biggest barriers - f/u AM labs, replete lytes as needed with lasix dose yesterday - unasyn to ceftriaxone x 1 more days - H2b, vent/PAD bundle - Fiance updated at bedside, Uncle vs. Brother is Administrator, [employer] Service (right click and "Control and instrumentation engineer" daily)   Diet/type: on hold for TEE DVT prophylaxis: lovenox GI prophylaxis: H2B Lines: N/A Foley:  Yes, and it is still needed Code Status:  full code Last date of multidisciplinary goals of care discussion [pending]   SO updated at bedside 11/5  31 min cc time Myrla Halsted MD PCCM

## 2023-02-18 NOTE — Progress Notes (Signed)
Nutrition Follow-up  DOCUMENTATION CODES:  Severe malnutrition in context of social or environmental circumstances  INTERVENTION:  Once cortrak replaced : Osmolite 1.5 at 25 ml/h and increase by 10 ml every 4 hours to goal rate of 55 ml/hr  (1320 ml per day) Prosource TF20 60 ml BID Provides 2140 kcal, 122 gm protein, 1003 ml free water daily MVI daily  NUTRITION DIAGNOSIS:  Severe Malnutrition related to social / environmental circumstances as evidenced by severe fat depletion, severe muscle depletion. - remains applicable   GOAL:  Patient will meet greater than or equal to 90% of their needs - being met with TF at goal  MONITOR:  TF tolerance  REASON FOR ASSESSMENT:  Consult Enteral/tube feeding initiation and management  ASSESSMENT:  Pt with PMH of sickle cell, chronic pain, retinal hemorrhage, tobacco use, MJ use, vitamin D deficiency, and GERD admitted with acute encephalopathy, found wandering [place] altered. Pt intubated.  10/29 - admitted with AMS 10/30 - intubated, LP attempted at bedside and in IR, unsuccessful 11/3 Repeat MRI shows scattered new small acute infarcts bilateral cerebral and cerebellar  11/4 - LP in IR 11/5 - TEE (scheduled)   Patient currently remains intubated on ventilator support. TF held in anticipation for TEE after LP yesterday.   Checked in after TEE and cortrak was pulled during procedure. Discussed with MD, will place order for cortrak to be placed again tomorrow and will resume TF once verified.  MV: 10.9 L/min Temp (24hrs), Avg:100.8 F (38.2 C), Min:99.9 F (37.7 C), Max:102 F (38.9 C)  Propofol: 14.87 ml/hr (393 kcal/d)  Admit weight: 82.6 kg   Current weight: 71.3 kg   Intake/Output Summary (Last 24 hours) at 02/18/2023 1552 Last data filed at 02/18/2023 1400 Gross per 24 hour  Intake 2213.04 ml  Output 2420 ml  Net -206.96 ml  Net IO Since Admission: 2,360.62 mL [02/18/23 1552]  Nutritionally Relevant  Medications: Scheduled Meds:  famotidine  20 mg Per Tube BID   feeding supplement (PROSource TF20)  60 mL Per Tube BID   folic acid  1 mg Per Tube Daily   free water  200 mL Per Tube Q4H   insulin aspart  0-9 Units Subcutaneous Q4H   lactulose  30 g Per Tube TID   multivitamin with minerals  1 tablet Per Tube Daily   phenobarbital  32.4 mg Per Tube Q8H   polyethylene glycol  17 g Per Tube Daily   thiamine  250 mg Per Tube Daily   Continuous Infusions:  cefTRIAXone (ROCEPHIN)  IV Stopped (02/17/23 1914)   feeding supplement (OSMOLITE 1.5 CAL) Stopped (02/17/23 1530)   norepinephrine (LEVOPHED) Adult infusion Stopped (02/18/23 0549)   propofol (DIPRIVAN) infusion 30 mcg/kg/min (02/18/23 0600)   PRN Meds: bisacodyl   Labs Reviewed: Na 147 Mg 2.5 CBG ranges from 73-105 mg/dL over the last 24 hours HgbA1c 4.8% (10/30)  NUTRITION - FOCUSED PHYSICAL EXAM: Flowsheet Row Most Recent Value  Orbital Region Moderate depletion  Upper Arm Region Severe depletion  Thoracic and Lumbar Region Severe depletion  Buccal Region Unable to assess  Temple Region Severe depletion  Clavicle Bone Region Severe depletion  Clavicle and Acromion Bone Region Severe depletion  Scapular Bone Region Unable to assess  Dorsal Hand Unable to assess  Patellar Region Moderate depletion  Anterior Thigh Region Moderate depletion  Posterior Calf Region Moderate depletion  Edema (RD Assessment) None  Hair Reviewed  Eyes Unable to assess  Mouth Unable to assess  Skin Reviewed  Nails Unable to assess   Diet Order:   Diet Order             Diet NPO time specified  Diet effective now                   EDUCATION NEEDS:  Not appropriate for education at this time  Skin:  Skin Assessment: Reviewed RN Assessment  Last BM:  11/5 - type 6  Height:  Ht Readings from Last 1 Encounters:  02/15/23 6' 0.01" (1.829 m)    Weight:  Wt Readings from Last 1 Encounters:  02/18/23 71.3 kg    Ideal Body  Weight:  80.9 kg  BMI:  Body mass index is 21.31 kg/m.  Estimated Nutritional Needs:  Kcal:  2100-2400 Protein:  120-140 grams Fluid:  >2 L/day    Greig Castilla, RD, LDN Clinical Dietitian RD pager # available in AMION  After hours/weekend pager # available in [facility]

## 2023-02-18 NOTE — Interval H&P Note (Signed)
History and Physical Interval Note:  02/18/2023 1:43 PM  Peter Kline  has presented today for surgery, with the diagnosis of * No surgery found *.  The various methods of treatment have been discussed with the patient and family. After consideration of risks, benefits and other options for treatment, the patient has consented to  * No surgery found * as a surgical intervention.  The patient's history has been reviewed, patient examined, no change in status, stable for surgery.  I have reviewed the patient's chart and labs.  Questions were answered to the patient's satisfaction.     Hancel Ion [employer]

## 2023-02-18 NOTE — CV Procedure (Signed)
Procedure: TEE  Sedationn: Propofol per CCM  Indication: CVA  Findings: Please see echo section for full report.  Normal LV size with EF 55-60%.  Normal wall motion.  Normal RV size and systolic function.  Normal right atrial size.  Mild left atrial enlargement, no LA appendage thrombus.  Bubble study was positive, suggesting a small PFO (unable to find definitively by color doppler).   No significant tricuspid regurgitation.  Trivial mitral regurgitation.  Trileaflet aortic valve with no stenosis or regurgitation.  Normal caliber thoracic aorta with minimal plaque.   Only possible source of embolus noted was a small PFO.   Peter Kline 02/18/2023 2:03 PM

## 2023-02-18 NOTE — Progress Notes (Signed)
Pharmacy Antibiotic Note  Peter Kline is a 57 y.o. male admitted on 02/11/2023 with continued aspiration with possible pneumonia. Pharmacy has been consulted for vancomycin and piperacillin/tazobactam dosing.   Discussed with MD, MRSA swab negative 11/4, all blood cultures negative. OK to hold vancomycin for now.   Plan: piperacillin/tazobactam 3.375g Q8r EI Monitor cultures, clinical status, renal function Narrow abx as able and f/u duration ,    Height: 6' 0.01" (182.9 cm) Weight: 71.3 kg (157 lb 3 oz) IBW/kg (Calculated) : 77.62  Temp (24hrs), Avg:100.7 F (38.2 C), Min:99.9 F (37.7 C), Max:102 F (38.9 C)  Recent Labs  Lab 02/11/23 2313 02/12/23 0626 02/14/23 0655 02/14/23 0859 02/15/23 0213 02/16/23 0733 02/17/23 0430 02/18/23 0625 02/18/23 1041  WBC  --    < > 21.0*  --  14.6* 17.4* 15.3*  --  19.0*  CREATININE  --    < >  --  0.85 0.79 0.83 0.79 0.89  --   LATICACIDVEN 0.8  --   --   --   --   --   --   --   --    < > = values in this interval not displayed.    Estimated Creatinine Clearance: 91.2 mL/min (by C-G formula based on SCr of 0.89 mg/dL).    No Known Allergies  Antimicrobials this admission: CTX 10/30 >>11/1, 11/4 >> 11/5 Doxy 10/31 >> 11/1 Acyclovir 10/30  Ampicillin 10/30  Vancomycin 10/30  Decadron (meningitis) 10/30 Unasyn 11/1 >> 11/4 Piptazo 11/5 >>   Microbiology:  10/29 MRSA neg 10/29 strep pneumo urine neg 10/29 Bcx: negative 10/31 TA - negative 11/3 BCx - ngtd 11/4 CSF - ngtd 11/4 MRSA neg  11/5 TA: ngtd   Thank you for allowing pharmacy to be a part of this patient's care.  Alphia Moh, PharmD, BCPS, BCCP Clinical Pharmacist  Please check AMION for all [facility] Pharmacy phone numbers After 10:00 PM, call Main Pharmacy [phone]

## 2023-02-18 NOTE — Progress Notes (Addendum)
eLink Physician-Brief Progress Note Patient Name: Peter Kline DOB: 1965-11-30 MRN: [id number]   Date of Service  02/18/2023  HPI/Events of Note  57 year old presented with acute encephalopathy was intubated on the ventilator.  Lost his enteral access today during TEE.  Remains encephalopathic-pending meds include BuSpar, Ativan, lactulose and Pepcid.  eICU Interventions  Place OG tube while waiting for IR guided core track in the morning  No clear indication for rectal tube.  Maintain enteral lactulose, can hold polyethylene glycol and rectal lactulose   2204 -OG tube confirmed to be in the stomach, okay to use.  Reviewed previous orders-targeted temperature management for normothermia 36.5-37.5 ordered.  Would proceed with utilizing external cooling measures.  0119 -patient had copious rectal output, maintain Flexi-Seal.  No indication to trend ammonia as it does not normally correlate with encephalopathy.  Intervention Category Minor Interventions: Routine modifications to care plan (e.g. PRN medications for pain, fever)  Janyce Ellinger 02/18/2023, 7:55 PM

## 2023-02-18 NOTE — Progress Notes (Signed)
EEG complete - results pending 

## 2023-02-18 NOTE — Progress Notes (Addendum)
STROKE TEAM PROGRESS NOTE   BRIEF HPI [name] is a  57 y.o. male  has a past medical history of Shortness of breath and Sickle cell anemia (HCC). who presented was found wandering the streets confused and got intubated for persistent agitation. Etiology is unclear. MRI brain, cEEG unrevealing, failed LP attempts, being treated for aspiration pneumonia. Difficulty weaning off sedation due to agitaition and vent dyssnychrony.   SIGNIFICANT HOSPITAL EVENTS 11/3 Repeat MRI showed scattered new small acute infarcts bilateral cerebral and cerebellar. CTA negative.  11/4: LP completed by CCM. Results negative.   INTERIM HISTORY/SUBJECTIVE Family and RN at bedside.  Currently intubated and sedated, propofol @ 30, fentanyl @ 100. BUE sight withdraws to pain:  LUE withdraws more than RUE. Opens eyes slightly to noxious stimuli, while on sedation.   TEE planned for today, 11/5. Plan to wean off propofol after procedure, possibly switch to precedex. Patient has increased secretions today, respiratory culture sent. Patient also has history of alcohol abuse. Increased secretions and withdrawal symptoms may likely make extubation more difficult.   OBJECTIVE  CBC    Component Value Date/Time   WBC 19.0 (H) 02/18/2023 1041   RBC 3.71 (L) 02/18/2023 1041   HGB 10.4 (L) 02/18/2023 1041   HGB 13.6 11/25/2018 1534   HCT 29.5 (L) 02/18/2023 1041   HCT 41.1 11/25/2018 1534   PLT 220 02/18/2023 1041   PLT 198 11/25/2018 1534   MCV 79.5 (L) 02/18/2023 1041   MCV 87 11/25/2018 1534   MCH 28.0 02/18/2023 1041   MCHC 35.3 02/18/2023 1041   RDW 15.0 02/18/2023 1041   RDW 15.6 (H) 11/25/2018 1534   LYMPHSABS 1.5 02/11/2023 2007   LYMPHSABS 2.7 09/03/2017 1402   MONOABS 2.3 (H) 02/11/2023 2007   EOSABS 0.1 02/11/2023 2007   EOSABS 0.4 09/03/2017 1402   BASOSABS 0.1 02/11/2023 2007   BASOSABS 0.1 09/03/2017 1402    BMET    Component Value Date/Time   NA 147 (H) 02/18/2023 0625   NA 137  11/25/2018 1534   K 3.8 02/18/2023 0625   CL 108 02/18/2023 0625   CO2 24 02/18/2023 0625   GLUCOSE 94 02/18/2023 0625   BUN 18 02/18/2023 0625   BUN 7 11/25/2018 1534   CREATININE 0.89 02/18/2023 0625   CREATININE 0.92 05/02/2014 1430   CALCIUM 8.7 (L) 02/18/2023 0625   GFRNONAA >60 02/18/2023 0625   GFRNONAA >89 05/02/2014 1430    IMAGING past 24 hours DG FL GUIDED LUMBAR PUNCTURE  Result Date: 02/17/2023 CLINICAL DATA:  Provided history: Meningitis. EXAM: DIAGNOSTIC LUMBAR PUNCTURE UNDER FLUOROSCOPIC GUIDANCE COMPARISON:  CT chest/abdomen/pelvis 12/25/2020 FLUOROSCOPY: Radiation Exposure Index (as provided by the fluoroscopic device): 7.90 mGy Kerma PROCEDURE: Due to the patient's altered mental status, informed consent was obtained the patient's uncle Elgie Congo) via telephone prior to the procedure. This process included a discussion of procedure risk. The patient was positioned prone on the fluoroscopy table. An appropriate skin entry site was determined under fluoroscopy and marked. A time-out was performed. The operator donned sterile gloves and a mask. The lower back was prepped and draped in the usual sterile fashion. Local anesthesia was provided with 1% lidocaine. Under intermittent fluoroscopy, lumbar puncture was performed at the L4-L5 level using a 20 gauge spinal needle with return of clear/colorless CSF. 12.5 mL of CSF were collected for laboratory studies. The inner stylet was replaced within the needle and the needle was removed in its entirety. A dressing was applied at  the skin entry site. No immediate post-procedure complication was apparent. IMPRESSION: 1. Fluoroscopically-guided L4-L5 lumbar puncture. 2. 12.5 mL of CSF collected and sent for laboratory studies. 3. No immediate post-procedure complication. Electronically Signed   By: Jackey Loge D.O.   On: 02/17/2023 15:08   DG Abd Portable 1V  Result Date: 02/17/2023 CLINICAL DATA:  Feeding tube placement. EXAM:  PORTABLE ABDOMEN - 1 VIEW COMPARISON:  Radiograph 02/15/2023 FINDINGS: The previous enteric tube has been removed. There is a new weighted enteric tube with tip in the right upper quadrant in the region of the distal stomach. Nonobstructive upper abdominal bowel gas pattern. IMPRESSION: Weighted enteric tube with tip in the region of the distal stomach. Electronically Signed   By: Narda Rutherford M.D.   On: 02/17/2023 15:04    Vitals:   02/18/23 0600 02/18/23 0615 02/18/23 0630 02/18/23 0645  BP: (!) 101/59 107/72 (!) 99/54 (!) 90/51  Pulse: 75 91 84 80  Resp: 18 19 20 18   Temp: 100.2 F (37.9 C) (!) 100.4 F (38 C) (!) 100.8 F (38.2 C) (!) 101.1 F (38.4 C)  TempSrc:      SpO2: 99% 98% 99% 99%  Weight:      Height:         General: Critically ill appearing African American male laying in ICU bed, in no acute distress this morning with sedation briefly paused for exam. Remains in bilateral soft wrist restraints for agitation episodes overnight. No overt signs of meningismus on examination.  HENT: ETT remains in place with securement device.  Head: Normocephalic Cardiovascular: Normal rate and regular rhythm.  Respiratory: Respirations assisted via mechanical ventilator, patient initiates spontaneous respirations on vent. No respiratory distress noted. GI: Soft.  No distension. There is no tenderness.  Skin: WDI.    NEURO Mental status/Cognition: Spontaneous, partial eye opening throughout exam.  Does not follow commands.   Speech/language: mute, ETT, no attempts to communicate. Cranial nerves:   CN II Ovoid right pupil > round left pupil, both sluggishly reactive to light   CN III,IV,VI Right gaze preference, dolls eyes reflex is intact, does not fixate or track.    CN V Corneals intact BL to eyelash brush, does not BTT on the left, BTT intact on thr right.   CN VII Facial symmetry obscured due to ETT and securement device, within that limitation, no overt facial asymmetry noted    CN VIII Unable to assess due to patient's condition   CN IX & X Cough intact, gag not assessed   CN XI Nare stimulation not completed today   CN XII Does not protrude tongue to command    Sensory/Motor:  Withdraws slightly in BUE, left more than right.  In bilateral soft wrist restraints but no response to noxious stimuli. Bedside RN reports strong agitated movements throughout when sedation paused without asymmetry or noted weakness.  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 ASPECTS 10.     10/31 MRI   No evidence of intracranial infection.  Left Occipital white matter acute infarct Small chronic infratentorial infarcts including left pons Left frontal lobe encephalomalacia 11/3 MRI Scattered new small acute infarcts bilateral cerebral and cerebellar hemispheres.   LTM EEG 11/2 1008 to 11/2 1340: suggestive of moderate to severe diffuse encephalopathy. No seizures seen.  2D Echo: EF 55-60%, Grade II diastolic  dysfunction, Moderately dilated left ateria, Mild to Moderate MVR TEE: PENDING, scheduled for today   LDL 49 HgbA1c 4.8 VTE prophylaxis - lovenox No antithrombotic prior to admission, now on No antithrombotic due to concern for endocarditis.   Therapy recommendations:  Pending Disposition:  pending  Acute Encephalopathy Secondary to alcohol abuse, hyperammonemia CIWA protocol in place Lactulose ordered Currently intubated and sedated d/t increased agitation and continued AMS  Acute Respiratory Failure ?Aspiration Pneumonia Requiring mechanical ventilation VAP bundle Wean sedation as able On Unasyn Respiratory Culture sent 11/5  Hx of Hypertension Home meds:  none Stable, on lower end BP goal normotensive, as out of permissive hypertension window  due to unclear LKW  Hyperlipidemia Home meds:  none LDL 49, goal < 70 High intensity statin not indicated due to LDL within goal   Diabetes type II, no history Home meds:  none HgbA1c 4.8, goal < 7.0 CBGs SSI  Tobacco Abuse Current smoker      Ready to quit? N/A Nicotine patch ordered   Substance Abuse Patient uses marijuana UDS positive for  THC      Ready to quit? N/A  Dysphagia Patient has post-stroke dysphagia, SLP consulted    Diet   Diet NPO time specified   Advance diet as tolerated  Other Stroke Risk Factors ETOH abuse, alcohol level <10, advised to drink no more than 1-2 drink(s) a day CIWA protocol Hx of Sickle Cell disease  Other Active Problems, per primary team GERD Fever, tMax 101.3 WBC 21.6 -> 17.6 -> 21.0 -> 14.6 -> 17.4 -> 15.3 -> 19.0 Currently on Unasyn Anemia, Hbg 9.7 -> 10.4 Hypernatremia, Na 138->141->145->147 Free Water flushes Q4H-->Q6H  Hospital day # 6   Pt seen by Neuro NP/APP and later by MD. Note/plan to be edited by MD as needed.    Lynnae January, DNP, AGACNP-BC [facility] Please use AMION for contact information & EPIC for messaging.  I have personally obtained history,examined this patient, reviewed notes, independently viewed imaging studies, participated in medical decision making and plan of care.ROS completed by me personally and pertinent positives fully documented  I have made any additions or clarifications directly to the above note. Agree with note above.  Patient remains heavily sedated and intubated on ventilatory support for his agitation and pneumonia and his neurological exam is limited..  TEE is planned for later today.  Recommend wean off sedation.  Long discussion with fianc at the bedside and answered questions.  Discussed with Dr. Katrinka Blazing critical care medicine. This patient is critically ill and at significant risk of neurological worsening, death and care requires constant monitoring of vital  signs, hemodynamics,respiratory and cardiac monitoring, extensive review of multiple databases, frequent neurological assessment, discussion with family, other specialists and medical decision making of high complexity.I have made any additions or clarifications directly to the above note.This critical care time does not reflect procedure time, or teaching time or supervisory time of PA/NP/Med Resident etc but could involve care discussion time.  I spent 30 minutes of neurocritical care time  in the care of  this patient.     Delia Heady, MD Medical Director [facility] Pager: [phone] 02/18/2023 1:50 PM   To contact Stroke Continuity provider, please refer to [email or web address]. After hours, contact General Neurology

## 2023-02-18 NOTE — Progress Notes (Signed)
NAME:  Peter Kline, MRN:  [id number], DOB:  Oct 17, 1965, LOS: 6 ADMISSION DATE:  02/11/2023, CONSULTATION DATE:  02/12/23 REFERRING MD:  EDP, CHIEF COMPLAINT:  acute resp failure   History of Present Illness:  57 yo male who's hpi is severely limited 2/2 acute encephalopathy and resp failure now on ventilator and sedation. Pt was reportedly found wandering the streets of [place] altered. Pt was brought in by EMS with slurred speech and confusion. He was orginally evaluated as code stroke that was negative. Pt's mental status failed to improve and agitation was difficult to allow for evaluation and work up so he was subsequently intubated for airway protection.  PMH sickle cell, chronic pain, retinal hemorrhage, tobacco use, MJ use, Vit D def, Priapism, GERD   Imaging noted multifocal pna, leukocytosis and metabolic acidosis  Ccm was asked to admit to ICU  Pertinent  Medical History  Sickle cell, chronic pain, retinal hemorrhage, tobacco use, MJ use, Vit D def, Priapism, GERD  Significant Hospital Events: Including procedures, antibiotic start and stop dates in addition to other pertinent events   Admitted to [facility] 10/30, LP attempted, MRI  10/31 cLTM 11/1 small aspiration event from emesis, increased secretions, neuro consulted 11/2 agitation followed by aspiration event and evening  Interim History / Subjective:  LP neg Intubated on vent Low grade fever For TEE today  Objective   Blood pressure (!) 90/51, pulse 80, temperature (!) 101.1 F (38.4 C), resp. rate 18, height 6' 0.01" (1.829 m), weight 71.3 kg, SpO2 99%.    Vent Mode: PRVC FiO2 (%):  [30 %] 30 % Set Rate:  [18 bmp] 18 bmp Vt Set:  [620 mL] 620 mL PEEP:  [5 cmH20] 5 cmH20 Plateau Pressure:  [19 cmH20-25 cmH20] 22 cmH20   Intake/Output Summary (Last 24 hours) at 02/18/2023 0706 Last data filed at 02/18/2023 0600 Gross per 24 hour  Intake 2430.43 ml  Output 4140 ml  Net -1709.57 ml   Filed Weights    02/16/23 0704 02/17/23 0500 02/18/23 0444  Weight: 75.1 kg 72.3 kg 71.3 kg   Examination: Stable disconjugate gaze Abnormal ovoid R pupils stable Not tracking or following commands Anasarca improved Heart sounds regular Abd soft hypoactive BS, cortrak in place  LP neg Retic WNL Other labs pending  Resolved Hospital Problem list     Assessment & Plan:  Acute encephalopathy- multifactorial in setting of alcohol abuse, query w/d, query hepatic encephalopathy, and query effect of multiple small strokes.  Sedation and vent weans complicated by secretions burden and continued AMS.  Some unusual exposure with recent sewage cleanup in backyard (?) Multiple embolic CVAs- noted on sequential MRI concerning for embolic phenomena, vessels not c/w sickling events; does not explain degree of encephalopathy Acute hypoxemic respiratory failure- after aspiration event; less c/w acute chest; ongoing high secretion burden Hx sickle cell disease -(associated retinopathy, chronic pain, priapism, splenic sequestration): no e/o flare Hx GERD  - Receiving empiric thiamine, lactulose, continue - TEE today then wean sedation to see where we stand; secretion burden and cough strength may be biggest barriers - f/u AM labs, replete lytes as needed with lasix dose yesterday - unasyn to ceftriaxone x 1 more days - H2b, vent/PAD bundle - Fiance updated at bedside, Uncle vs. Brother is Administrator, [employer] (right click and "Control and instrumentation engineer" daily)   Diet/type: on hold for TEE DVT prophylaxis: lovenox GI prophylaxis: H2B Lines: N/A Foley:  Yes, and it is still needed Code Status:  full code Last date of multidisciplinary goals of care discussion [pending]   SO updated at bedside 11/5  31 min cc time Myrla Halsted MD PCCM

## 2023-02-19 ENCOUNTER — Inpatient Hospital Stay (HOSPITAL_COMMUNITY): Payer: 59

## 2023-02-19 DIAGNOSIS — R569 Unspecified convulsions: Secondary | ICD-10-CM | POA: Diagnosis not present

## 2023-02-19 DIAGNOSIS — R4182 Altered mental status, unspecified: Secondary | ICD-10-CM | POA: Diagnosis not present

## 2023-02-19 DIAGNOSIS — I749 Embolism and thrombosis of unspecified artery: Secondary | ICD-10-CM | POA: Diagnosis not present

## 2023-02-19 LAB — HEPARIN LEVEL (UNFRACTIONATED): Heparin Unfractionated: <0.1 [IU]/mL — ABNORMAL LOW (ref 0.30–0.70)

## 2023-02-19 LAB — BASIC METABOLIC PANEL
Anion gap: 13 (ref 5–15)
BUN: 21 mg/dL — ABNORMAL HIGH (ref 6–20)
CO2: 27 mmol/L (ref 22–32)
Calcium: 8.7 mg/dL — ABNORMAL LOW (ref 8.9–10.3)
Chloride: 107 mmol/L (ref 98–111)
Creatinine, Ser: 0.78 mg/dL (ref 0.61–1.24)
GFR, Estimated: >60 mL/min (ref >60)
Glucose, Bld: 79 mg/dL (ref 70–99)
Potassium: 3.3 mmol/L — ABNORMAL LOW (ref 3.5–5.1)
Sodium: 147 mmol/L — ABNORMAL HIGH (ref 135–145)

## 2023-02-19 LAB — CK: Total CK: 51 U/L (ref 49–397)

## 2023-02-19 LAB — MAGNESIUM: Magnesium: 2.6 mg/dL — ABNORMAL HIGH (ref 1.7–2.4)

## 2023-02-19 LAB — CBC
HCT: 27.6 % — ABNORMAL LOW (ref 39.0–52.0)
Hemoglobin: 9.8 g/dL — ABNORMAL LOW (ref 13.0–17.0)
MCH: 27.7 pg (ref 26.0–34.0)
MCHC: 35.5 g/dL (ref 30.0–36.0)
MCV: 78 fL — ABNORMAL LOW (ref 80.0–100.0)
Platelets: 208 10*3/uL (ref 150–400)
RBC: 3.54 MIL/uL — ABNORMAL LOW (ref 4.22–5.81)
RDW: 15.2 % (ref 11.5–15.5)
WBC: 21.5 10*3/uL — ABNORMAL HIGH (ref 4.0–10.5)
nRBC: 0.2 % (ref 0.0–0.2)

## 2023-02-19 LAB — TRIGLYCERIDES: Triglycerides: 195 mg/dL — ABNORMAL HIGH (ref <150)

## 2023-02-19 LAB — GLUCOSE, CAPILLARY
Glucose-Capillary: 81 mg/dL (ref 70–99)
Glucose-Capillary: 69 mg/dL — ABNORMAL LOW (ref 70–99)
Glucose-Capillary: 99 mg/dL (ref 70–99)
Glucose-Capillary: 93 mg/dL (ref 70–99)
Glucose-Capillary: 88 mg/dL (ref 70–99)
Glucose-Capillary: 100 mg/dL — ABNORMAL HIGH (ref 70–99)
Glucose-Capillary: 124 mg/dL — ABNORMAL HIGH (ref 70–99)

## 2023-02-19 MED ORDER — HEPARIN (PORCINE) 25000 UT/250ML-% IV SOLN
1050.0000 [IU]/h | INTRAVENOUS | Status: DC
  Administered 2023-02-19: 850 [IU]/h via INTRAVENOUS
  Administered 2023-02-20: 1050 [IU]/h via INTRAVENOUS
  Filled 2023-02-19 (×2): qty 250

## 2023-02-19 MED ORDER — DEXTROSE 50 % IV SOLN
25.0000 mL | Freq: Once | INTRAVENOUS | Status: AC
  Administered 2023-02-19: 25 mL via INTRAVENOUS

## 2023-02-19 MED ORDER — OSMOLITE 1.5 CAL PO LIQD
1000.0000 mL | ORAL | Status: DC
  Administered 2023-02-19 – 2023-02-24 (×5): 1000 mL
  Filled 2023-02-19: qty 1000

## 2023-02-19 MED ORDER — ACETAMINOPHEN 500 MG PO TABS
1000.0000 mg | ORAL_TABLET | Freq: Four times a day (QID) | ORAL | Status: DC
  Administered 2023-02-19 – 2023-02-24 (×20): 1000 mg
  Filled 2023-02-19 (×20): qty 2

## 2023-02-19 MED ORDER — LACTULOSE 10 GM/15ML PO SOLN
30.0000 g | Freq: Two times a day (BID) | ORAL | Status: DC
  Administered 2023-02-19: 30 g
  Filled 2023-02-19: qty 45

## 2023-02-19 MED ORDER — MEPERIDINE HCL 25 MG/ML IJ SOLN
25.0000 mg | Freq: Once | INTRAMUSCULAR | Status: AC
  Administered 2023-02-19: 25 mg via INTRAVENOUS
  Filled 2023-02-19: qty 1

## 2023-02-19 MED ORDER — POTASSIUM CHLORIDE 20 MEQ PO PACK
40.0000 meq | PACK | Freq: Two times a day (BID) | ORAL | Status: AC
Start: 2023-02-19 — End: 2023-02-19
  Administered 2023-02-19 (×2): 40 meq
  Filled 2023-02-19 (×2): qty 2

## 2023-02-19 NOTE — Progress Notes (Signed)
Afternoon rounds: extubated this afternoon to [place]. Strong cough. Was attempting to speak, but not following commands. TEE yesterday with +PFO helps to explain emboli. Now on heparin gtt. Issues with fever/shivering. Given buspar/demerol/tylenol today. Stopping cooling blanket for now. Will see if this helps as shivering could be precipitating some of his fever.

## 2023-02-19 NOTE — Procedures (Addendum)
Extubation Procedure Note  Patient Details:   Name: Peter Kline DOB: [birth year] MRN: [id number]   Airway Documentation:  Airway 7.5 mm (Active)  Secured at (cm) 25 cm 02/19/23 1102  Measured From Lips 02/19/23 1102  Secured Location Right 02/19/23 1102  Secured By [employer] 02/19/23 1102  Tube Holder Repositioned Yes 02/19/23 1102  Prone position No 02/19/23 1102  Cuff Pressure (cm H2O) Green OR 18-26 CmH2O 02/19/23 0727  Site Condition Dry 02/19/23 1102   Vent end date: 02/19/23 Vent end time: 1428   Evaluation  O2 sats: stable throughout Complications: No apparent complications Patient did tolerate procedure well. Bilateral Breath Sounds: Clear, Diminished   Yes Pt extubated per MD Allen Norris 02/19/2023, 2:26 PM

## 2023-02-19 NOTE — Progress Notes (Addendum)
STROKE TEAM PROGRESS NOTE   BRIEF HPI [name] is a  57 y.o. male  has a past medical history of Shortness of breath and Sickle cell anemia (HCC). who presented was found wandering the streets confused and got intubated for persistent agitation. Etiology is unclear. MRI brain, cEEG unrevealing, failed LP attempts, being treated for aspiration pneumonia. Difficulty weaning off sedation due to agitaition and vent dyssnychrony.   SIGNIFICANT HOSPITAL EVENTS 11/3 Repeat MRI showed scattered new small acute infarcts bilateral cerebral and cerebellar. CTA negative.  11/4: LP completed by CCM. Results negative.   INTERIM HISTORY/SUBJECTIVE RN at the bedside. Patient is on IV fentanyl.  EEG - moderate to severe encephalopathy  [place] LE with acute DVT in left posterior tibial and peroneal veins. Will start heparin gtt today  TEE yesterday showed presence of right-to-left shunt hence patient's stroke etiology likely paradoxical embolism  K is 3.3 and being replaced, WBC 21.5, T max 102.2   OBJECTIVE  CBC    Component Value Date/Time   WBC 21.5 (H) 02/19/2023 0545   RBC 3.54 (L) 02/19/2023 0545   HGB 9.8 (L) 02/19/2023 0545   HGB 13.6 11/25/2018 1534   HCT 27.6 (L) 02/19/2023 0545   HCT 41.1 11/25/2018 1534   PLT 208 02/19/2023 0545   PLT 198 11/25/2018 1534   MCV 78.0 (L) 02/19/2023 0545   MCV 87 11/25/2018 1534   MCH 27.7 02/19/2023 0545   MCHC 35.5 02/19/2023 0545   RDW 15.2 02/19/2023 0545   RDW 15.6 (H) 11/25/2018 1534   LYMPHSABS 1.5 02/11/2023 2007   LYMPHSABS 2.7 09/03/2017 1402   MONOABS 2.3 (H) 02/11/2023 2007   EOSABS 0.1 02/11/2023 2007   EOSABS 0.4 09/03/2017 1402   BASOSABS 0.1 02/11/2023 2007   BASOSABS 0.1 09/03/2017 1402    BMET    Component Value Date/Time   NA 147 (H) 02/19/2023 0545   NA 137 11/25/2018 1534   K 3.3 (L) 02/19/2023 0545   CL 107 02/19/2023 0545   CO2 27 02/19/2023 0545   GLUCOSE 79 02/19/2023 0545   BUN 21 (H) 02/19/2023  0545   BUN 7 11/25/2018 1534   CREATININE 0.78 02/19/2023 0545   CREATININE 0.92 05/02/2014 1430   CALCIUM 8.7 (L) 02/19/2023 0545   GFRNONAA >60 02/19/2023 0545   GFRNONAA >89 05/02/2014 1430    IMAGING past 24 hours DG CHEST PORT 1 VIEW  Result Date: 02/19/2023 CLINICAL DATA:  Pneumonia.  Intubated. EXAM: PORTABLE CHEST 1 VIEW COMPARISON:  Chest x-ray dated February 15, 2023. FINDINGS: Endotracheal tube tip 5 cm above the carina. Enteric tube within the stomach. The heart size and mediastinal contours are within normal limits. Basilar and peripheral predominant patchy airspace opacities in both lungs have improved over the past 4 days. No pleural effusion or pneumothorax. No acute osseous abnormality. IMPRESSION: 1. Improving multifocal pneumonia. Electronically Signed   By: Obie Dredge M.D.   On: 02/19/2023 10:32   DG Abd Portable 1V  Result Date: 02/19/2023 CLINICAL DATA:  Enteric tube placement. EXAM: PORTABLE ABDOMEN - 1 VIEW COMPARISON:  Abdominal x-ray from yesterday. FINDINGS: Enteric tube remains appropriately positioned in the stomach. Visualized bowel gas pattern is normal. Patchy airspace disease in both lung bases, not significantly changed. IMPRESSION: 1. Enteric tube appropriately positioned in the stomach. Electronically Signed   By: Obie Dredge M.D.   On: 02/19/2023 10:31   EEG adult  Result Date: 02/19/2023 Charlsie Quest, MD     02/19/2023  8:13 AM Patient Name: Peter Kline MRN: [id number] Epilepsy Attending: Charlsie Quest Referring Physician/Provider: Lorin Glass, MD Date: 02/18/2023 Duration: 24.35 mins Patient history:  57yo M with ams getting eeg to evaluate for seizure  Level of alertness:  comatose  AEDs during EEG study: Propofol, Phenobarb  Technical aspects: This EEG study was done with scalp electrodes positioned according to the 10-20 International system of electrode placement. Electrical activity was reviewed with band pass filter of 1-70Hz ,  sensitivity of 7 uV/mm, display speed of 75mm/sec with a 60Hz  notched filter applied as appropriate. EEG data were recorded continuously and digitally stored.  Video monitoring was available and reviewed as appropriate.  Description: EEG showed continuous generalized predominantly 5 to 6 Hz theta slowing admixed with intermittent 2-3hz  delta slowing. Hyperventilation and photic stimulation were not performed.    ABNORMALITY - Continuous slow, generalized  IMPRESSION: This study is suggestive of moderate to severe diffuse encephalopathy. No seizures or epileptiform discharges were seen throughout the recording.  Charlsie Quest  DG Abd 1 View  Result Date: 02/18/2023 CLINICAL DATA:  Confirm OG tube placement. EXAM: ABDOMEN - 1 VIEW COMPARISON:  02/17/2023. FINDINGS: The bowel gas pattern is normal. An enteric tube terminates in the stomach and appears appropriate in position. Patchy airspace disease is present in the lungs bilaterally and increased from the prior exam. IMPRESSION: 1. OG tube terminates in the stomach and appears appropriate in position. 2. Nonobstructive bowel-gas pattern. 3. Patchy airspace disease at the lung bases, increased from the prior exam, possible edema or multifocal pneumonia. Electronically Signed   By: Thornell Sartorius M.D.   On: 02/18/2023 21:43   VAS [place] LOWER EXTREMITY VENOUS (DVT)  Result Date: 02/18/2023  Lower Venous DVT Study Patient Name:  Peter Kline  Date of Exam:   02/18/2023 Medical Rec #: [id number]        Accession #:    [id number] Date of Birth: April 16, 1965        Patient Gender: M Patient Age:   71 years Exam Location:  [facility] Procedure:      VAS [place] LOWER EXTREMITY VENOUS (DVT) Referring Phys: Levon Hedger --------------------------------------------------------------------------------  Indications: Stroke.  Comparison Study: No prior study. Performing Technologist: Fernande Bras  Examination Guidelines: A complete evaluation includes B-mode  imaging, spectral Doppler, color Doppler, and power Doppler as needed of all accessible portions of each vessel. Bilateral testing is considered an integral part of a complete examination. Limited examinations for reoccurring indications may be performed as noted. The reflux portion of the exam is performed with the patient in reverse Trendelenburg.  +---------+---------------+---------+-----------+----------+-----------------+ RIGHT    CompressibilityPhasicitySpontaneityPropertiesThrombus Aging    +---------+---------------+---------+-----------+----------+-----------------+ CFV      Full           Yes      Yes                                    +---------+---------------+---------+-----------+----------+-----------------+ SFJ      Full                                                           +---------+---------------+---------+-----------+----------+-----------------+ FV Prox  Full                                                           +---------+---------------+---------+-----------+----------+-----------------+  FV Mid   Full                                                           +---------+---------------+---------+-----------+----------+-----------------+ FV DistalFull                                                           +---------+---------------+---------+-----------+----------+-----------------+ PFV      Full                                                           +---------+---------------+---------+-----------+----------+-----------------+ POP      Full           Yes      Yes                                    +---------+---------------+---------+-----------+----------+-----------------+ PTV      None           No       No                   Age Indeterminate +---------+---------------+---------+-----------+----------+-----------------+ PERO     None           No       No                   Age Indeterminate  +---------+---------------+---------+-----------+----------+-----------------+   +---------+---------------+---------+-----------+----------+--------------+ LEFT     CompressibilityPhasicitySpontaneityPropertiesThrombus Aging +---------+---------------+---------+-----------+----------+--------------+ CFV      Full           Yes      Yes                                 +---------+---------------+---------+-----------+----------+--------------+ SFJ      Full                                                        +---------+---------------+---------+-----------+----------+--------------+ FV Prox  Full                                                        +---------+---------------+---------+-----------+----------+--------------+ FV Mid   Full                                                        +---------+---------------+---------+-----------+----------+--------------+ FV DistalFull                                                        +---------+---------------+---------+-----------+----------+--------------+  PFV      Full                                                        +---------+---------------+---------+-----------+----------+--------------+ POP      Full           Yes      Yes                                 +---------+---------------+---------+-----------+----------+--------------+ PTV      Full                                                        +---------+---------------+---------+-----------+----------+--------------+ PERO     None           No       No                   Acute          +---------+---------------+---------+-----------+----------+--------------+    Summary: BILATERAL: -No evidence of popliteal cyst, bilaterally. RIGHT: - Findings consistent with age indeterminate deep vein thrombosis involving the right posterior tibial veins, and right peroneal veins.   LEFT: - Findings consistent with acute deep vein thrombosis  involving the left posterior tibial veins, and left peroneal veins.   *See table(s) above for measurements and observations.    Preliminary    ECHO TEE  Result Date: 02/18/2023    TRANSESOPHOGEAL ECHO REPORT   Patient Name:   MADSEN RIDDLE Date of Exam: 02/18/2023 Medical Rec #:  [id number]       Height:       72.0 in Accession #:    [id number]      Weight:       157.2 lb Date of Birth:  15-Jan-1966       BSA:          1.923 m Patient Age:    57 years        BP:           108/64 mmHg Patient Gender: M               HR:           94 bpm. Exam Location:  Inpatient Procedure: Transesophageal Echo, Cardiac Doppler and Color Doppler Indications:     Endocarditis  History:         Patient has prior history of Echocardiogram examinations, most                  recent 02/16/2023. Stroke.  Sonographer:     Darlys Gales Referring Phys:  [id number] Perlie Gold Diagnosing Phys: Wilfred Lacy PROCEDURE: After discussion of the risks and benefits of a TEE, an informed consent was obtained from a family member. The transesophogeal probe was passed without difficulty through the esophogus of the patient. Sedation performed by different physician. The patient developed no complications during the procedure.  IMPRESSIONS  1. Left ventricular ejection fraction, by estimation, is 55 to 60%. The left ventricle has normal function. The left ventricle has no regional wall motion abnormalities.  2. Right ventricular systolic function is normal. The right ventricular size is normal. Tricuspid regurgitation signal is inadequate for assessing PA pressure.  3. Left atrial size was mildly dilated. No left atrial/left atrial appendage thrombus was detected.  4. The mitral valve is normal in structure. Trivial mitral valve regurgitation. No evidence of mitral stenosis.  5. The aortic valve is tricuspid. Aortic valve regurgitation is not visualized. No aortic stenosis is present.  6. Bubble study positive suggesting PFO. FINDINGS  Left  Ventricle: Left ventricular ejection fraction, by estimation, is 55 to 60%. The left ventricle has normal function. The left ventricle has no regional wall motion abnormalities. The left ventricular internal cavity size was normal in size. There is  no left ventricular hypertrophy. Right Ventricle: The right ventricular size is normal. No increase in right ventricular wall thickness. Right ventricular systolic function is normal. Tricuspid regurgitation signal is inadequate for assessing PA pressure. Left Atrium: Left atrial size was mildly dilated. No left atrial/left atrial appendage thrombus was detected. Right Atrium: Right atrial size was normal in size. Pericardium: There is no evidence of pericardial effusion. Mitral Valve: The mitral valve is normal in structure. Trivial mitral valve regurgitation. No evidence of mitral valve stenosis. Tricuspid Valve: The tricuspid valve is normal in structure. Tricuspid valve regurgitation is trivial. Aortic Valve: The aortic valve is tricuspid. Aortic valve regurgitation is not visualized. No aortic stenosis is present. Pulmonic Valve: The pulmonic valve was normal in structure. Pulmonic valve regurgitation is trivial. Aorta: The aortic root and ascending aorta are structurally normal, with no evidence of dilitation. IAS/Shunts: Bubble study positive suggesting PFO. Dalton McleanMD Electronically signed by Wilfred Lacy Signature Date/Time: 02/18/2023/2:44:53 PM    Final     Vitals:   02/19/23 0955 02/19/23 1000 02/19/23 1100 02/19/23 1102  BP:  133/88 134/83 134/83  Pulse:  (!) 104 (!) 112 (!) 107  Resp:  (!) 23 (!) 34 16  Temp: (!) 100.5 F (38.1 C) (!) 100.5 F (38.1 C) (!) 101.5 F (38.6 C)   TempSrc:  Bladder    SpO2:  99% 97% 97%  Weight:      Height:         General: Critically ill appearing African American male laying in ICU bed, in no acute distress this morning on fentanyl gtt HENT: ETT remains in place with securement device.  Head:  Normocephalic Cardiovascular: Normal rate and regular rhythm.  Respiratory: Respirations assisted via mechanical ventilator, patient initiates spontaneous respirations on vent. No respiratory distress noted. GI: Soft.  No distension. There is no tenderness.  Skin: WDI.    NEURO Mental status/Cognition: Spontaneous, partial eye opening throughout exam.  Does not follow commands.   Speech/language: mute, ETT, no attempts to communicate. Cranial nerves:   CN II Ovoid right pupil > round left pupil, both sluggishly reactive to light   CN III,IV,VI Right gaze preference, dolls eyes reflex is intact, does not fixate or track.    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 ASPECTS 10.     10/31 MRI   No evidence of intracranial infection.  Left Occipital white matter acute infarct Small chronic infratentorial infarcts including left pons Left frontal lobe encephalomalacia 11/3 MRI Scattered new small acute infarcts bilateral cerebral and cerebellar hemispheres.   LTM EEG 11/2 1008 to 11/2 1340: suggestive of moderate to severe diffuse encephalopathy. No seizures seen.  2D Echo: EF 55-60%, Grade II diastolic dysfunction, Moderately dilated left ateria, Mild to Moderate MVR TEE: EF 55-60%. Bubble  study was positive, suggesting a small PFO  [place] LE acute DVT in left posterior tibial and peroneal veins. Will start heparin gtt today   LDL 49 HgbA1c 4.8 VTE prophylaxis -heparin IV  No antithrombotic prior to admission, now on heparin IV .  Transition to oral anticoagulation when patient able to swallow Therapy recommendations:  Pending Disposition:  pending  Acute Encephalopathy Secondary to alcohol abuse, hyperammonemia CIWA protocol in place Lactulose ordered Currently intubated and sedated d/t increased agitation and continued AMS  Acute Respiratory Failure ?Aspiration Pneumonia Requiring mechanical ventilation VAP bundle Wean sedation as able Completed Unasyn and ceftriaxone  Started on Zosyn with worsening leukocytosis and febrile  Respiratory Culture sent 11/5  Hx of Hypertension Home meds:  none Stable, on lower end BP goal normotensive, as out of permissive hypertension window due to unclear LKW  Hyperlipidemia Home meds:  none LDL 49, goal < 70 High intensity statin not indicated due to LDL within goal   Diabetes type II, no history Home meds:  none HgbA1c 4.8, goal < 7.0 CBGs SSI  Tobacco Abuse Current smoker      Ready to quit? N/A Nicotine patch ordered  On thiamine and folate   Substance Abuse Patient uses marijuana UDS positive for  THC      Ready to quit? N/A  Dysphagia Patient has post-stroke dysphagia, SLP consulted    Diet   Diet NPO time specified   Advance diet as tolerated  Other Stroke Risk Factors ETOH abuse, alcohol level <10, advised to drink no more than 1-2 drink(s) a day CIWA protocol Hx of Sickle Cell disease  Other Active Problems, per primary team GERD Fever, tMax 102.2 WBC 21.6 -> 17.6 -> 21.0 -> 14.6 -> 17.4 -> 15.3 -> 19.0->21.5 Currently on zosyn  Anemia, Hbg 9.7 -> 10.4->9.8 Hypokalemia K 3.3- replaced Hypernatremia, Na 138->141->145->147->147 Free Water flushes Q4H-->Q6H  Hospital day # 7   Pt  seen by Neuro NP/APP and later by MD. Note/plan to be edited by MD as needed.    Gevena Mart DNP, ACNPC-AG  [facility] I have personally obtained history,examined this patient, reviewed notes, independently viewed imaging studies, participated in medical decision making and plan of care.ROS completed by me personally and pertinent positives fully documented  I have made any additions or clarifications directly to the above note. Agree with note above.  Patient remains sedated though propofol is off and is on fentanyl still and neurological exam remains limited.  He does not localize partially left more than right to noxious stimuli.  Lower extremity Doppler showed DVT and TEE showed right-to-left shunt hence we will start IV heparin anticoagulation and transition to oral anticoagulation when able to swallow.  Continue to wean off ventilatory support and sedation as per CCM.  Long discussion with fianc at the bedside and answered questions.  Discussed with Dr. Katrinka Blazing. This patient is critically ill and at significant risk of neurological worsening, death and care requires constant monitoring of vital signs, hemodynamics,respiratory and cardiac monitoring, extensive review of multiple databases, frequent neurological assessment, discussion with family, other specialists and medical decision making of high complexity.I have made any additions or clarifications directly to the above note.This critical care time does not reflect procedure time, or teaching time or supervisory time of PA/NP/Med Resident etc but could involve care discussion time.  I spent 30 minutes of neurocritical care time  in the care of  this patient.     Delia Heady, MD Medical Director [facility] Pager: [phone] 02/19/2023 12:42 PM  To contact Stroke Continuity provider, please refer to [email or web address]. After hours, contact General Neurology

## 2023-02-19 NOTE — Plan of Care (Signed)
Patient admitted s/p altered mental status. During morning rounds team at bedside to assess. Sedation paused, patient able to become more alert, extubated around mid-afternoon, tolerating well on 3-4L nasal canula. Patient did have recurring fevers up to 101.5, cooling blanket and ice packs applied. Patient somewhat responded well to interventions. Increased shivering noted, team aware, one time dose of demerol given patient responded well to interventions. Cortrak placed, tube feeds restarted, patient tolerating well. Patient and family updated in plan of care. ICU status maintained.   Problem: Education: Goal: Ability to describe self-care measures that may prevent or decrease complications (Diabetes Survival Skills Education) will improve Outcome: Progressing Goal: Individualized Educational Video(s) Outcome: Progressing   Problem: Coping: Goal: Ability to adjust to condition or change in health will improve Outcome: Progressing   Problem: Fluid Volume: Goal: Ability to maintain a balanced intake and output will improve Outcome: Progressing   Problem: Health Behavior/Discharge Planning: Goal: Ability to identify and utilize available resources and services will improve Outcome: Progressing Goal: Ability to manage health-related needs will improve Outcome: Progressing   Problem: Metabolic: Goal: Ability to maintain appropriate glucose levels will improve Outcome: Progressing   Problem: Nutritional: Goal: Maintenance of adequate nutrition will improve Outcome: Progressing Goal: Progress toward achieving an optimal weight will improve Outcome: Progressing   Problem: Skin Integrity: Goal: Risk for impaired skin integrity will decrease Outcome: Progressing   Problem: Tissue Perfusion: Goal: Adequacy of tissue perfusion will improve Outcome: Progressing   Problem: Safety: Goal: Non-violent Restraint(s) Outcome: Progressing   Problem: Education: Goal: Knowledge of General  Education information will improve Description: Including pain rating scale, medication(s)/side effects and non-pharmacologic comfort measures Outcome: Progressing   Problem: Health Behavior/Discharge Planning: Goal: Ability to manage health-related needs will improve Outcome: Progressing   Problem: Clinical Measurements: Goal: Ability to maintain clinical measurements within normal limits will improve Outcome: Progressing Goal: Will remain free from infection Outcome: Progressing Goal: Diagnostic test results will improve Outcome: Progressing Goal: Respiratory complications will improve Outcome: Progressing Goal: Cardiovascular complication will be avoided Outcome: Progressing   Problem: Activity: Goal: Risk for activity intolerance will decrease Outcome: Progressing   Problem: Nutrition: Goal: Adequate nutrition will be maintained Outcome: Progressing   Problem: Coping: Goal: Level of anxiety will decrease Outcome: Progressing   Problem: Elimination: Goal: Will not experience complications related to bowel motility Outcome: Progressing Goal: Will not experience complications related to urinary retention Outcome: Progressing   Problem: Pain Management: Goal: General experience of comfort will improve Outcome: Progressing   Problem: Safety: Goal: Ability to remain free from injury will improve Outcome: Progressing   Problem: Skin Integrity: Goal: Risk for impaired skin integrity will decrease Outcome: Progressing   Problem: Education: Goal: Knowledge of disease or condition will improve Outcome: Progressing Goal: Knowledge of secondary prevention will improve (MUST DOCUMENT ALL) Outcome: Progressing Goal: Knowledge of patient specific risk factors will improve Peter Kline N/A or DELETE if not current risk factor) Outcome: Progressing   Problem: Ischemic Stroke/TIA Tissue Perfusion: Goal: Complications of ischemic stroke/TIA will be minimized Outcome:  Progressing   Problem: Coping: Goal: Will verbalize positive feelings about self Outcome: Progressing Goal: Will identify appropriate support needs Outcome: Progressing   Problem: Health Behavior/Discharge Planning: Goal: Ability to manage health-related needs will improve Outcome: Progressing Goal: Goals will be collaboratively established with patient/family Outcome: Progressing   Problem: Self-Care: Goal: Ability to participate in self-care as condition permits will improve Outcome: Progressing Goal: Verbalization of feelings and concerns over difficulty with self-care will improve Outcome: Progressing Goal:  Ability to communicate needs accurately will improve Outcome: Progressing   Problem: Nutrition: Goal: Risk of aspiration will decrease Outcome: Progressing Goal: Dietary intake will improve Outcome: Progressing

## 2023-02-19 NOTE — Progress Notes (Signed)
NAME:  Peter Kline, MRN:  [id number], DOB:  12-01-1965, LOS: 7 ADMISSION DATE:  02/11/2023, CONSULTATION DATE:  02/12/2023 REFERRING MD:  EDP, CHIEF COMPLAINT:  acute respiratory failure   History of Present Illness:  57 year old male with past medical history of sickle cell c/b priapism, GERD, chronic pain, retinal hemorrhage, tobacco use, marijuana use, vitamin D deficiency  presented to the emergency department on 10/30 with altered mental status. Was reportedly found wandering streets of [place] altered. He was brought in by EMS with slurred speech and confusion so was evaluated as a code stroke. This was negative. Mental status failed to improve, became agitated and eventually intubated for airway protection. In ED had chest xray concerning for multifocal pneumonia, leukocytosis to 30, ethanol negative, UDS + THC, UA negative, ammonia negative, resp panel negative Pertinent  Medical History  sickle cell c/b priapism, GERD, chronic pain, retinal hemorrhage, tobacco use, marijuana use, vitamin D deficiency  Significant Hospital Events: Including procedures, antibiotic start and stop dates in addition to other pertinent events   10/30: admitted for AMS, resp failure, intubated  10/31: cLTM 11/1: small aspiration event from emesis, increased secretions, neuro consulted  11/2: agitated and further aspiration event 11/3: repeat MRI with scattered new infarcts in bilateral cerebral and cerebellar hemispheres - embolic  11/5: bilateral LE DVT +  11/6: started on heparin gtt, zosyn started yesterday. Getting TTE bubble study for showered MRI. CXR this am with what looks to be infarcts   Interim History / Subjective:  Patient unable to participate in subjective portion of exam. He had LP by IR completed which was negative. He is intubated on the ventilator, minimal settings. Sedated and withdraws to pain stimulus. TEE yesterday negative. But + bilateral LE DVT and embolic strokes on MRI on  11/3  Objective   Blood pressure 97/65, pulse 64, temperature 97.9 F (36.6 C), temperature source Bladder, resp. rate 16, height 6' 0.01" (1.829 m), weight 70.6 kg, SpO2 100%.    Vent Mode: PRVC FiO2 (%):  [30 %] 30 % Set Rate:  [18 bmp] 18 bmp Vt Set:  [841 mL] 620 mL PEEP:  [5 cmH20] 5 cmH20 Plateau Pressure:  [22 cmH20-28 cmH20] 22 cmH20   Intake/Output Summary (Last 24 hours) at 02/19/2023 0738 Last data filed at 02/19/2023 0700 Gross per 24 hour  Intake 1244.91 ml  Output 2420 ml  Net -1175.09 ml   Filed Weights   02/17/23 0500 02/18/23 0444 02/19/23 0500  Weight: 72.3 kg 71.3 kg 70.6 kg    Examination: General: middle aged male, intubated, sedated in no acute distress HENT: disconjugate gaze, pupils sluggish  Lungs: rhonchi bases, otherwise clear  Cardiovascular: s1/s2 without murmur, rub, gallop  Abdomen: soft, +BS, NGT Extremities: no significant edema Neuro: sedated, withdraws to pain in bilateral upper and lower extremities  GU: foley   Resolved Hospital Problem list     Assessment & Plan:  Acute encephalopathy; multifactorial.  Multiple embolic CVA;  Acute bilateral LE DVT  Has history of alcohol abuse ?w/d however past window at this time. Alcohol, volatiles, UDS negative on admission. CSF negative for meningitis. Resp panels negative. TEE negative vegetation. DVT+ 11/5. MRI with multiple emboli. CXR 11/6 with infarct appearing opacities. Would be unusual for DVT to cause MRI findings, but not impossible. Could all just be hypercoagulable state r/t sepsis/inflammation. Fiance at bedside also concerned patient may have taken her Baclofen at home given ongoing AMS - TTE w/ bubble this AM to eval given pulm/cerebral/cerebellar infarcts -  heparin gtt, no bolus, low goal for DVT  - con't lactulose, decreased to BID as he is having large output stool now. If AMS is in part from baclofen, may take a while to clear.  - empiric thiamine  - wean sedation to better  assess mental status; can also reevaluate secretion burden and cough.   Acute hypoxic respiratory failure; CXR with multifocal pneumonia on admission. Hospitalization with aspiration event. Completed unasyn, rocephin - CXR 11/6 AM with ?infarct appearing opacities - broadened to Zosyn 11/5 for worsening fever curve, leukocytosis  - con't H2B - VAP bundle  - wean sedation  - on minimal vent settings, SBT when sedation is weaned   Hypokalemia; likely from Lasix dose yesterday  - replete per protocol  - trend BMP  History of sickle cell disease; has associated retinopathy, chronic pain, priapism, splenic sequestration - no evidence of flare at this time  History of GERD  - receiving H2B here for critical illness.   Best Practice (right click and "Reselect all SmartList Selections" daily)   Diet/type: tubefeeds and NPO DVT prophylaxis: LMWH GI prophylaxis: H2B Lines: N/A Foley:  Yes, and it is still needed Code Status:  full code Last date of multidisciplinary goals of care discussion [pending]  Labs   CBC: Recent Labs  Lab 02/15/23 0213 02/16/23 0733 02/17/23 0430 02/18/23 1041 02/19/23 0545  WBC 14.6* 17.4* 15.3* 19.0* PENDING  HGB 9.3* 10.1* 9.7* 10.4* 9.8*  HCT 25.9* 28.5* 27.5* 29.5* 27.6*  MCV 79.0* 78.3* 78.1* 79.5* 78.0*  PLT 142* 154 164 220 208    Basic Metabolic Panel: Recent Labs  Lab 02/12/23 1312 02/12/23 1832 02/13/23 0735 02/13/23 1652 02/14/23 0859 02/15/23 0213 02/16/23 0733 02/17/23 0430 02/18/23 0625 02/19/23 0545  NA  --   --  138  --    < > 145 147* 147* 147* 147*  K  --   --  3.7  --    < > 4.2 3.7 3.7 3.8 3.3*  CL  --   --  109  --    < > 115* 109 111 108 107  CO2  --   --  19*  --    < > 24 26 26 24 27   GLUCOSE  --   --  168*  --    < > 113* 101* 124* 94 79  BUN  --   --  21*  --    < > 13 14 14 18  21*  CREATININE  --   --  1.02  --    < > 0.79 0.83 0.79 0.89 0.78  CALCIUM  --   --  8.5*  --    < > 8.6* 9.0 8.7* 8.7* 8.7*  MG 2.0  2.0 2.2 2.5*  --  2.3  --  2.3 2.5* 2.6*  PHOS 2.8 2.3* 3.3 3.3  --   --   --   --   --   --    < > = values in this interval not displayed.   GFR: Estimated Creatinine Clearance: 100.5 mL/min (by C-G formula based on SCr of 0.78 mg/dL). Recent Labs  Lab 02/16/23 0733 02/17/23 0430 02/18/23 0625 02/18/23 1041 02/19/23 0545  PROCALCITON  --   --  0.47  --   --   WBC 17.4* 15.3*  --  19.0* PENDING    Liver Function Tests: Recent Labs  Lab 02/14/23 0859 02/17/23 0851  AST 30 35  ALT 23 47*  ALKPHOS 74 123  BILITOT 0.8 1.0  PROT 6.2* 7.1  ALBUMIN 2.8* 2.6*   No results for input(s): "LIPASE", "AMYLASE" in the last 168 hours. Recent Labs  Lab 02/14/23 0859 02/16/23 0753  AMMONIA 39* 53*    ABG    Component Value Date/Time   PHART 7.277 (L) 02/12/2023 0132   PCO2ART 49.6 (H) 02/12/2023 0132   PO2ART 276 (H) 02/12/2023 0132   HCO3 23.3 02/12/2023 0132   TCO2 25 02/12/2023 0132   ACIDBASEDEF 4.0 (H) 02/12/2023 0132   O2SAT 99.3 02/14/2023 1757     Coagulation Profile: No results for input(s): "INR", "PROTIME" in the last 168 hours.  Cardiac Enzymes: No results for input(s): "CKTOTAL", "CKMB", "CKMBINDEX", "TROPONINI" in the last 168 hours.  HbA1C: Hgb A1c MFr Bld  Date/Time Value Ref Range Status  02/12/2023 06:26 AM 4.8 4.8 - 5.6 % Final    Comment:    (NOTE)         Prediabetes: 5.7 - 6.4         Diabetes: >6.4         Glycemic control for adults with diabetes: <7.0   10/01/2013 01:29 PM 4.4 <5.7 % Final    Comment:                                                                           According to the ADA Clinical Practice Recommendations for 2011, when HbA1c is used as a screening test:     >=6.5%   Diagnostic of Diabetes Mellitus            (if abnormal result is confirmed)   5.7-6.4%   Increased risk of developing Diabetes Mellitus   References:Diagnosis and Classification of Diabetes Mellitus,Diabetes Care,2011,34(Suppl 1):S62-S69 and  Standards of Medical Care in         Diabetes - 2011,Diabetes Care,2011,34 (Suppl 1):S11-S61.      CBG: Recent Labs  Lab 02/18/23 1921 02/18/23 2011 02/18/23 2222 02/18/23 2316 02/19/23 0311  GLUCAP 64* 85 92 93 81    Review of Systems:   As above  Past Medical History:  He,  has a past medical history of Shortness of breath and Sickle cell anemia (HCC).   Surgical History:   Past Surgical History:  Procedure Laterality Date   LAPAROSCOPIC GASTROTOMY W/ REPAIR OF ULCER       Social History:   reports that he has been smoking cigarettes. He has never used smokeless tobacco. He reports current alcohol use of about 6.0 standard drinks of alcohol per week. He reports current drug use. Drug: Marijuana.   Family History:  His family history includes Diabetes in his mother; Hypertension in his mother.   Allergies No Known Allergies   Home Medications  Prior to Admission medications   Medication Sig Start Date End Date Taking? Authorizing Provider  folic acid (FOLVITE) 1 MG tablet Take 1 tablet (1 mg total) by mouth daily. 03/22/19  Yes Massie Maroon, FNP  gabapentin (NEURONTIN) 300 MG capsule Take 1 capsule (300 mg total) by mouth 3 (three) times daily. 03/22/19  Yes Massie Maroon, FNP     Critical care time: 35    [facility] 02/19/2023, 7:48  AM  Please see [email or web address] for pager details.  From 7A-7P if no response, please call [phone]. After hours, please call ELink [phone].

## 2023-02-19 NOTE — Procedures (Signed)
Cortrak  Person Inserting Tube:  Loisann Roach T, RD Tube Type:  Cortrak - 43 inches Tube Size:  10 Tube Location:  Left nare Secured by: Bridle Technique Used to Measure Tube Placement:  Marking at nare/corner of mouth Cortrak Secured At:  78 cm   Cortrak Tube Team Note:  Consult received to place a Cortrak feeding tube.   X-ray is required, abdominal x-ray has been ordered by the Cortrak team. Please confirm tube placement before using the Cortrak tube.   If the tube becomes dislodged please keep the tube and contact the Cortrak team at [email or web address] for replacement.  If after hours and replacement cannot be delayed, place a NG tube and confirm placement with an abdominal x-ray.    Shelle Iron RD, LDN For contact information, refer to [facility].

## 2023-02-19 NOTE — Procedures (Signed)
Patient Name: Peter Kline  MRN: [id number]  Epilepsy Attending: Charlsie Quest  Referring Physician/Provider: Lorin Glass, MD  Date: 02/18/2023 Duration: 24.35 mins  Patient history:  57yo M with ams getting eeg to evaluate for seizure   Level of alertness:  comatose   AEDs during EEG study: Propofol, Phenobarb   Technical aspects: This EEG study was done with scalp electrodes positioned according to the 10-20 International system of electrode placement. Electrical activity was reviewed with band pass filter of 1-70Hz , sensitivity of 7 uV/mm, display speed of 34mm/sec with a 60Hz  notched filter applied as appropriate. EEG data were recorded continuously and digitally stored.  Video monitoring was available and reviewed as appropriate.   Description: EEG showed continuous generalized predominantly 5 to 6 Hz theta slowing admixed with intermittent 2-3hz  delta slowing. Hyperventilation and photic stimulation were not performed.      ABNORMALITY - Continuous slow, generalized   IMPRESSION: This study is suggestive of moderate to severe diffuse encephalopathy. No seizures or epileptiform discharges were seen throughout the recording.   [name] [name]

## 2023-02-19 NOTE — Progress Notes (Signed)
PHARMACY - ANTICOAGULATION CONSULT NOTE  Pharmacy Consult:  Heparin Indication: DVT  No Known Allergies  Patient Measurements: Height: 6' 0.01" (182.9 cm) Weight: 70.6 kg (155 lb 10.3 oz) IBW/kg (Calculated) : 77.62 Heparin Dosing Weight: 70 kg  Vital Signs: Temp: 100.4 F (38 C) (11/06 0926) Temp Source: Bladder (11/06 0926) BP: 121/79 (11/06 0900) Pulse Rate: 98 (11/06 0926)  Labs: Recent Labs    02/17/23 0430 02/18/23 0625 02/18/23 1041 02/19/23 0537 02/19/23 0545  HGB 9.7*  --  10.4*  --  9.8*  HCT 27.5*  --  29.5*  --  27.6*  PLT 164  --  220  --  208  CREATININE 0.79 0.89  --   --  0.78  CKTOTAL  --   --   --  51  --     Estimated Creatinine Clearance: 100.5 mL/min (by C-G formula based on SCr of 0.78 mg/dL).   Medical History: Past Medical History:  Diagnosis Date   Shortness of breath    Sickle cell anemia (HCC)      Assessment: 68 YOM with multifactorial encephalopathy.  Pharmacy consulted to dose IV heparin per stroke protocol for acute L DVT and age indeterminate R DVT.  Patient has multiple embolic CVAs.    Prophylactic Lovenox was given 11/5 PM.  CBC stable; no bleeding reported.  Goal of Therapy:  Heparin level 0.3-0.5 units/ml Monitor platelets by anticoagulation protocol: Yes   Plan:  D/C Lovenox No bolus Heparin gtt at 850 units/hr Check 6 hr heparin level Daily heparin level and CBC  Gregroy Dombkowski D. Laney Potash, PharmD, BCPS, BCCCP 02/19/2023, 9:35 AM

## 2023-02-19 NOTE — Progress Notes (Addendum)
PHARMACY - ANTICOAGULATION CONSULT NOTE  Pharmacy Consult:  Heparin Indication: DVT  No Known Allergies  Patient Measurements: Height: 6' 0.01" (182.9 cm) Weight: 70.6 kg (155 lb 10.3 oz) IBW/kg (Calculated) : 77.62 Heparin Dosing Weight: 70 kg  Vital Signs: Temp: 101.3 F (38.5 C) (11/06 1700) Temp Source: Axillary (11/06 1653) BP: 125/80 (11/06 1600) Pulse Rate: 102 (11/06 1653)  Labs: Recent Labs    02/17/23 0430 02/18/23 0625 02/18/23 1041 02/19/23 0537 02/19/23 0545 02/19/23 1613  HGB 9.7*  --  10.4*  --  9.8*  --   HCT 27.5*  --  29.5*  --  27.6*  --   PLT 164  --  220  --  208  --   HEPARINUNFRC  --   --   --   --   --  <0.10*  CREATININE 0.79 0.89  --   --  0.78  --   CKTOTAL  --   --   --  51  --   --     Estimated Creatinine Clearance: 100.5 mL/min (by C-G formula based on SCr of 0.78 mg/dL).   Medical History: Past Medical History:  Diagnosis Date   Shortness of breath    Sickle cell anemia (HCC)     Assessment: 86 YOM with multifactorial encephalopathy.  Pharmacy consulted to dose IV heparin per stroke protocol for acute L DVT and age indeterminate R DVT.  Patient has multiple embolic CVAs.  No anticoagulation prior to admission.   Heparin level < 0.1 is subtherapeutic on 850 units/hr.  No issues with infusion or bleeding per RN.   Goal of Therapy:  Heparin level 0.3-0.5 units/ml Monitor platelets by anticoagulation protocol: Yes   Plan:   Increase Heparin gtt to 1000 units/hr  F/u 8hr heparin level Monitor daily heparin level, CBC, signs/symptoms of bleeding   ADDENDUM 18:52- RN reported moderate bleeding at two PIV sites, blood is staining his gown. RN removed dressing and bleeding was stopped. Will reduce heparin increase to 950 units/hr.    Alphia Moh, PharmD, BCPS, BCCP Clinical Pharmacist  Please check AMION for all [facility] phone numbers After 10:00 PM, call Main Pharmacy [phone]

## 2023-02-19 NOTE — Plan of Care (Signed)
Problem: Education: Goal: Ability to describe self-care measures that may prevent or decrease complications (Diabetes Survival Skills Education) will improve 02/19/2023 1924 by Jeannine Kitten, RN Outcome: Progressing 02/19/2023 1738 by Jeannine Kitten, RN Outcome: Progressing Goal: Individualized Educational Video(s) 02/19/2023 1924 by Jeannine Kitten, RN Outcome: Progressing 02/19/2023 1738 by Jeannine Kitten, RN Outcome: Progressing   Problem: Coping: Goal: Ability to adjust to condition or change in health will improve 02/19/2023 1924 by Jeannine Kitten, RN Outcome: Progressing 02/19/2023 1738 by Jeannine Kitten, RN Outcome: Progressing   Problem: Fluid Volume: Goal: Ability to maintain a balanced intake and output will improve 02/19/2023 1924 by Jeannine Kitten, RN Outcome: Progressing 02/19/2023 1738 by Jeannine Kitten, RN Outcome: Progressing   Problem: Health Behavior/Discharge Planning: Goal: Ability to identify and utilize available resources and services will improve 02/19/2023 1924 by Jeannine Kitten, RN Outcome: Progressing 02/19/2023 1738 by Jeannine Kitten, RN Outcome: Progressing Goal: Ability to manage health-related needs will improve 02/19/2023 1924 by Jeannine Kitten, RN Outcome: Progressing 02/19/2023 1738 by Jeannine Kitten, RN Outcome: Progressing   Problem: Metabolic: Goal: Ability to maintain appropriate glucose levels will improve 02/19/2023 1924 by Jeannine Kitten, RN Outcome: Progressing 02/19/2023 1738 by Jeannine Kitten, RN Outcome: Progressing   Problem: Nutritional: Goal: Maintenance of adequate nutrition will improve 02/19/2023 1924 by Jeannine Kitten, RN Outcome: Progressing 02/19/2023 1738 by Jeannine Kitten, RN Outcome: Progressing Goal: Progress toward achieving an optimal weight will improve 02/19/2023 1924 by Jeannine Kitten, RN Outcome: Progressing 02/19/2023 1738 by Jeannine Kitten,  RN Outcome: Progressing   Problem: Skin Integrity: Goal: Risk for impaired skin integrity will decrease 02/19/2023 1924 by Jeannine Kitten, RN Outcome: Progressing 02/19/2023 1738 by Jeannine Kitten, RN Outcome: Progressing   Problem: Tissue Perfusion: Goal: Adequacy of tissue perfusion will improve 02/19/2023 1924 by Jeannine Kitten, RN Outcome: Progressing 02/19/2023 1738 by Jeannine Kitten, RN Outcome: Progressing   Problem: Safety: Goal: Non-violent Restraint(s) 02/19/2023 1924 by Jeannine Kitten, RN Outcome: Progressing 02/19/2023 1738 by Jeannine Kitten, RN Outcome: Progressing   Problem: Education: Goal: Knowledge of General Education information will improve Description: Including pain rating scale, medication(s)/side effects and non-pharmacologic comfort measures 02/19/2023 1924 by Jeannine Kitten, RN Outcome: Progressing 02/19/2023 1738 by Jeannine Kitten, RN Outcome: Progressing   Problem: Health Behavior/Discharge Planning: Goal: Ability to manage health-related needs will improve 02/19/2023 1924 by Jeannine Kitten, RN Outcome: Progressing 02/19/2023 1738 by Jeannine Kitten, RN Outcome: Progressing   Problem: Clinical Measurements: Goal: Ability to maintain clinical measurements within normal limits will improve 02/19/2023 1924 by Jeannine Kitten, RN Outcome: Progressing 02/19/2023 1738 by Jeannine Kitten, RN Outcome: Progressing Goal: Will remain free from infection 02/19/2023 1924 by Jeannine Kitten, RN Outcome: Progressing 02/19/2023 1738 by Jeannine Kitten, RN Outcome: Progressing Goal: Diagnostic test results will improve 02/19/2023 1924 by Jeannine Kitten, RN Outcome: Progressing 02/19/2023 1738 by Jeannine Kitten, RN Outcome: Progressing Goal: Respiratory complications will improve 02/19/2023 1924 by Jeannine Kitten, RN Outcome: Progressing 02/19/2023 1738 by Jeannine Kitten, RN Outcome:  Progressing Goal: Cardiovascular complication will be avoided 02/19/2023 1924 by Jeannine Kitten, RN Outcome: Progressing 02/19/2023 1738 by Jeannine Kitten, RN Outcome: Progressing   Problem: Activity: Goal: Risk for activity intolerance will decrease 02/19/2023 1924 by Jeannine Kitten, RN Outcome: Progressing 02/19/2023 1738 by Jeannine Kitten, RN Outcome: Progressing   Problem: Nutrition:  Goal: Adequate nutrition will be maintained 02/19/2023 1924 by Jeannine Kitten, RN Outcome: Progressing 02/19/2023 1738 by Jeannine Kitten, RN Outcome: Progressing   Problem: Coping: Goal: Level of anxiety will decrease 02/19/2023 1924 by Jeannine Kitten, RN Outcome: Progressing 02/19/2023 1738 by Jeannine Kitten, RN Outcome: Progressing   Problem: Elimination: Goal: Will not experience complications related to bowel motility 02/19/2023 1924 by Jeannine Kitten, RN Outcome: Progressing 02/19/2023 1738 by Jeannine Kitten, RN Outcome: Progressing Goal: Will not experience complications related to urinary retention 02/19/2023 1924 by Jeannine Kitten, RN Outcome: Progressing 02/19/2023 1738 by Jeannine Kitten, RN Outcome: Progressing   Problem: Pain Management: Goal: General experience of comfort will improve 02/19/2023 1924 by Jeannine Kitten, RN Outcome: Progressing 02/19/2023 1738 by Jeannine Kitten, RN Outcome: Progressing   Problem: Safety: Goal: Ability to remain free from injury will improve 02/19/2023 1924 by Jeannine Kitten, RN Outcome: Progressing 02/19/2023 1738 by Jeannine Kitten, RN Outcome: Progressing   Problem: Skin Integrity: Goal: Risk for impaired skin integrity will decrease 02/19/2023 1924 by Jeannine Kitten, RN Outcome: Progressing 02/19/2023 1738 by Jeannine Kitten, RN Outcome: Progressing   Problem: Education: Goal: Knowledge of disease or condition will improve 02/19/2023 1924 by Jeannine Kitten, RN Outcome:  Progressing 02/19/2023 1738 by Jeannine Kitten, RN Outcome: Progressing Goal: Knowledge of secondary prevention will improve (MUST DOCUMENT ALL) 02/19/2023 1924 by Jeannine Kitten, RN Outcome: Progressing 02/19/2023 1738 by Jeannine Kitten, RN Outcome: Progressing Goal: Knowledge of patient specific risk factors will improve [name] N/A or DELETE if not current risk factor) 02/19/2023 1924 by Jeannine Kitten, RN Outcome: Progressing 02/19/2023 1738 by Jeannine Kitten, RN Outcome: Progressing   Problem: Ischemic Stroke/TIA Tissue Perfusion: Goal: Complications of ischemic stroke/TIA will be minimized 02/19/2023 1924 by Jeannine Kitten, RN Outcome: Progressing 02/19/2023 1738 by Jeannine Kitten, RN Outcome: Progressing   Problem: Coping: Goal: Will verbalize positive feelings about self 02/19/2023 1924 by Jeannine Kitten, RN Outcome: Progressing 02/19/2023 1738 by Jeannine Kitten, RN Outcome: Progressing Goal: Will identify appropriate support needs 02/19/2023 1924 by Jeannine Kitten, RN Outcome: Progressing 02/19/2023 1738 by Jeannine Kitten, RN Outcome: Progressing   Problem: Health Behavior/Discharge Planning: Goal: Ability to manage health-related needs will improve 02/19/2023 1924 by Jeannine Kitten, RN Outcome: Progressing 02/19/2023 1738 by Jeannine Kitten, RN Outcome: Progressing Goal: Goals will be collaboratively established with patient/family 02/19/2023 1924 by Jeannine Kitten, RN Outcome: Progressing 02/19/2023 1738 by Jeannine Kitten, RN Outcome: Progressing   Problem: Self-Care: Goal: Ability to participate in self-care as condition permits will improve 02/19/2023 1924 by Jeannine Kitten, RN Outcome: Progressing 02/19/2023 1738 by Jeannine Kitten, RN Outcome: Progressing Goal: Verbalization of feelings and concerns over difficulty with self-care will improve 02/19/2023 1924 by Jeannine Kitten, RN Outcome:  Progressing 02/19/2023 1738 by Jeannine Kitten, RN Outcome: Progressing Goal: Ability to communicate needs accurately will improve 02/19/2023 1924 by Jeannine Kitten, RN Outcome: Progressing 02/19/2023 1738 by Jeannine Kitten, RN Outcome: Progressing   Problem: Nutrition: Goal: Risk of aspiration will decrease 02/19/2023 1924 by Jeannine Kitten, RN Outcome: Progressing 02/19/2023 1738 by Jeannine Kitten, RN Outcome: Progressing Goal: Dietary intake will improve 02/19/2023 1924 by Jeannine Kitten, RN Outcome: Progressing 02/19/2023 1738 by Jeannine Kitten, RN Outcome: Progressing

## 2023-02-20 ENCOUNTER — Inpatient Hospital Stay (HOSPITAL_COMMUNITY): Payer: 59

## 2023-02-20 DIAGNOSIS — I749 Embolism and thrombosis of unspecified artery: Secondary | ICD-10-CM | POA: Diagnosis not present

## 2023-02-20 DIAGNOSIS — R4182 Altered mental status, unspecified: Secondary | ICD-10-CM | POA: Diagnosis not present

## 2023-02-20 HISTORY — DX: Embolism and thrombosis of unspecified artery: I74.9

## 2023-02-20 LAB — CULTURE, RESPIRATORY W GRAM STAIN

## 2023-02-20 LAB — CBC
HCT: 29.5 % — ABNORMAL LOW (ref 39.0–52.0)
Hemoglobin: 10.4 g/dL — ABNORMAL LOW (ref 13.0–17.0)
MCH: 27.3 pg (ref 26.0–34.0)
MCHC: 35.3 g/dL (ref 30.0–36.0)
MCV: 77.4 fL — ABNORMAL LOW (ref 80.0–100.0)
Platelets: 267 10*3/uL (ref 150–400)
RBC: 3.81 MIL/uL — ABNORMAL LOW (ref 4.22–5.81)
RDW: 15.4 % (ref 11.5–15.5)
WBC: 22.6 10*3/uL — ABNORMAL HIGH (ref 4.0–10.5)
nRBC: 0.2 % (ref 0.0–0.2)

## 2023-02-20 LAB — GLUCOSE, CAPILLARY
Glucose-Capillary: 126 mg/dL — ABNORMAL HIGH (ref 70–99)
Glucose-Capillary: 129 mg/dL — ABNORMAL HIGH (ref 70–99)
Glucose-Capillary: 102 mg/dL — ABNORMAL HIGH (ref 70–99)
Glucose-Capillary: 94 mg/dL (ref 70–99)
Glucose-Capillary: 185 mg/dL — ABNORMAL HIGH (ref 70–99)
Glucose-Capillary: 136 mg/dL — ABNORMAL HIGH (ref 70–99)

## 2023-02-20 LAB — BASIC METABOLIC PANEL
Anion gap: 10 (ref 5–15)
BUN: 21 mg/dL — ABNORMAL HIGH (ref 6–20)
CO2: 26 mmol/L (ref 22–32)
Calcium: 8.8 mg/dL — ABNORMAL LOW (ref 8.9–10.3)
Chloride: 114 mmol/L — ABNORMAL HIGH (ref 98–111)
Creatinine, Ser: 0.91 mg/dL (ref 0.61–1.24)
GFR, Estimated: >60 mL/min (ref >60)
Glucose, Bld: 134 mg/dL — ABNORMAL HIGH (ref 70–99)
Potassium: 3.6 mmol/L (ref 3.5–5.1)
Sodium: 150 mmol/L — ABNORMAL HIGH (ref 135–145)

## 2023-02-20 LAB — MAGNESIUM: Magnesium: 3 mg/dL — ABNORMAL HIGH (ref 1.7–2.4)

## 2023-02-20 LAB — HEPARIN LEVEL (UNFRACTIONATED): Heparin Unfractionated: <0.1 [IU]/mL — ABNORMAL LOW (ref 0.30–0.70)

## 2023-02-20 LAB — PHOSPHORUS: Phosphorus: 2 mg/dL — ABNORMAL LOW (ref 2.5–4.6)

## 2023-02-20 LAB — PROCALCITONIN: Procalcitonin: 1.19 ng/mL

## 2023-02-20 MED ORDER — ORAL CARE MOUTH RINSE: 15.0000 mL | OROMUCOSAL | Status: DC | PRN

## 2023-02-20 MED ORDER — VANCOMYCIN HCL 1500 MG/300ML IV SOLN
1500.0000 mg | Freq: Once | INTRAVENOUS | Status: AC
  Administered 2023-02-20: 1500 mg via INTRAVENOUS
  Filled 2023-02-20: qty 300

## 2023-02-20 MED ORDER — ENOXAPARIN SODIUM 80 MG/0.8ML IJ SOSY
70.0000 mg | PREFILLED_SYRINGE | Freq: Two times a day (BID) | INTRAMUSCULAR | Status: DC
  Administered 2023-02-20 – 2023-02-21 (×2): 70 mg via SUBCUTANEOUS
  Filled 2023-02-20 (×2): qty 0.8

## 2023-02-20 MED ORDER — VANCOMYCIN HCL 1500 MG/300ML IV SOLN
1500.0000 mg | Freq: Two times a day (BID) | INTRAVENOUS | Status: DC
  Administered 2023-02-20 – 2023-02-23 (×6): 1500 mg via INTRAVENOUS
  Filled 2023-02-20 (×8): qty 300

## 2023-02-20 MED ORDER — ORAL CARE MOUTH RINSE
15.0000 mL | OROMUCOSAL | Status: DC
  Administered 2023-02-20 – 2023-03-03 (×31): 15 mL via OROMUCOSAL

## 2023-02-20 MED ORDER — FREE WATER
200.0000 mL | Status: DC
  Administered 2023-02-20 – 2023-02-26 (×38): 200 mL

## 2023-02-20 MED ORDER — POTASSIUM PHOSPHATES 15 MMOLE/5ML IV SOLN
15.0000 mmol | Freq: Once | INTRAVENOUS | Status: AC
  Administered 2023-02-20: 15 mmol via INTRAVENOUS
  Filled 2023-02-20: qty 5

## 2023-02-20 MED ORDER — ENSURE ENLIVE PO LIQD
237.0000 mL | Freq: Two times a day (BID) | ORAL | Status: DC
  Administered 2023-02-20 – 2023-02-27 (×5): 237 mL via ORAL

## 2023-02-20 NOTE — Progress Notes (Signed)
PT Evaluation Pt admitted with/for AMS.  Presently delirious and unable to participate well and needing max to total assist for all mobility.  Pt currently limited functionally due to the problems listed below.  (see problems list.)  Pt will benefit from PT to maximize function and safety to be able to get home safely with available assist     02/20/23 1132  PT Visit Information  Last PT Received On 02/20/23  Assistance Needed +2  History of Present Illness Pt is a 57 y/o male presenting to the ED on 10/30 with AMS, found wandering the streets of [place] confused with slurred speech.  Initially negative for stroke.  Mental status failed to improve, pt became more agitated and eventually was intubated for airway protection.  Chest xray showed multifocal PNA.  11/1 had small aspiration event that led to further agitation and further aspiration, 11/3 repeat MRI showed scattered new infarct in bil cerebrum and cerebellum, 11/5  bil LE DVT+  PMHx:  sickle cell anemia, lap repair of gastric ulcer.  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  Pain Descriptors / Indicators Aching;Grimacing;Guarding  Pain Intervention(s) Limited activity within patient's tolerance  Cognition  Arousal Lethargic (appears/responds as delirius)  Behavior During Therapy  (has been  agitated, presently "loopy and delirius.)  Overall Cognitive Status Impaired/Different from baseline  Area of Impairment Orientation;Attention;Following commands;Safety/judgement;Awareness;Problem solving  Orientation Level Person  Current Attention Level Focused  Following Commands Follows one step commands inconsistently  Safety/Judgement Decreased awareness of safety;Decreased awareness of deficits  Awareness Intellectual  Problem Solving Slow processing;Decreased initiation  Communication  Communication No apparent difficulties  Upper Extremity Assessment  Upper Extremity Assessment Generalized weakness  Lower Extremity Assessment  Lower Extremity Assessment Generalized weakness  Cervical / Trunk Assessment  Cervical / Trunk Assessment Normal  Bed Mobility  Overal bed mobility Needs Assistance  Bed Mobility Supine to Sit;Sit to Supine  Supine to sit Max assist  Sit to supine Max assist;Total assist  General bed mobility comments pt unable to focus on task to give significant assist.  Transfers  Overall transfer level Needs assistance  Transfers Sit to/from Stand  Sit to Stand Total assist;+2 safety/equipment (x2)  General transfer comment face to face assist fot stand at EOB x2.  Pt unable to fully activate LE musculature.  Ambulation/Gait  General Gait Details not able today  Balance  Overall balance assessment Needs assistance  Sitting-balance support Bilateral upper extremity supported;Single extremity supported;No upper extremity supported;Feet supported  Sitting balance-Leahy Scale Zero  Sitting balance - Comments little to no purposeful truncal control  Standing balance support Bilateral upper extremity supported  Standing balance-Leahy Scale Zero  General Comments  General comments (skin integrity, edema, etc.) VSS, pt not able to participate due to ?delirium?  Exercises  Exercises Other exercises  Other Exercises  Other Exercises AA/PROM to all 4's during  assessment  PT - End of Session  Activity Tolerance  Patient tolerated treatment well  Patient left in bed;with call bell/phone within reach;with bed alarm set;with family/visitor present  Nurse Communication Mobility status  PT Assessment  PT Recommendation/Assessment Patient needs continued PT services  PT Visit Diagnosis Other abnormalities of gait and mobility (R26.89);Muscle weakness (generalized) (M62.81);Other symptoms and signs involving the nervous system (R29.898)  PT Problem List Decreased strength;Decreased activity tolerance;Decreased balance;Decreased mobility;Decreased coordination;Decreased cognition;Decreased safety awareness;Pain  PT Plan  PT Frequency (ACUTE ONLY) Min 1X/week  PT Treatment/Interventions (ACUTE ONLY) Gait training;Functional mobility training;Therapeutic activities;Therapeutic exercise;Balance training;Patient/family education  AM-PAC PT "6 Clicks" Mobility Outcome Measure (Version 2)  Help needed turning from your back to your side while in a flat bed without using bedrails? 1  Help needed moving from lying on your back to sitting on the side of a flat bed without using bedrails? 1  Help needed moving to and from a bed to a chair (including a wheelchair)? 1  Help needed standing up from a chair using your arms (e.g., wheelchair or bedside chair)? 1  Help needed to walk in hospital room? 1  Help needed climbing 3-5 steps with a railing?  1  6 Click Score 6  Consider Recommendation of Discharge To: CIR/SNF/LTACH  Progressive Mobility  What is the highest level of mobility based on the progressive mobility assessment? Level 1 (Bedfast) - Unable to balance while sitting on edge of bed  Mobility Referral No  Activity Stood at bedside;Dangled on edge of bed  PT Recommendation  Follow Up Recommendations Other (comment) (TBD once cleared cognitively  SNF vs HHPT)  Patient can return home with the following A lot of help with walking and/or transfers;A lot of  help with bathing/dressing/bathroom;Assistance with cooking/housework;Direct supervision/assist for medications management;Direct supervision/assist for financial management;Assist for transportation;Help with stairs or ramp for entrance  Functional Status Assessment Patient has had a recent decline in their functional status and demonstrates the ability to make significant improvements in function in a reasonable and predictable amount of time.  PT equipment Other (comment) (TBD)  Individuals Consulted  Consulted and Agree with Results and Recommendations Patient  Acute Rehab PT Goals  Patient Stated Goal pt unable to participate  PT Goal Formulation Patient unable to participate in goal setting  Time For Goal Achievement 03/06/23  Potential to Achieve Goals Fair  PT Time Calculation  PT Start Time (ACUTE ONLY) 1110  PT Stop Time (ACUTE ONLY) 1130  PT Time Calculation (min) (ACUTE ONLY) 20 min  PT General Charges  $$ ACUTE PT VISIT 1 Visit  PT Evaluation  $PT Eval Moderate Complexity 1 Mod    02/20/2023  Jacinto Halim., PT Acute Rehabilitation Services [phone]  (office)

## 2023-02-20 NOTE — Evaluation (Signed)
Clinical/Bedside Swallow Evaluation Patient Details  Name: Peter Kline MRN: [id number] Date of Birth: April 07, 1966  Today's Date: 02/20/2023 Time: SLP Start Time (ACUTE ONLY): 0822 SLP Stop Time (ACUTE ONLY): 0844 SLP Time Calculation (min) (ACUTE ONLY): 22 min  Past Medical History:  Past Medical History:  Diagnosis Date   Shortness of breath    Sickle cell anemia (HCC)    Past Surgical History:  Past Surgical History:  Procedure Laterality Date   LAPAROSCOPIC GASTROTOMY W/ REPAIR OF ULCER     HPI:  57 yo male who's pmh and hpi are severely limited 2/2 acute encephalopathy and resp failure now on ventilator and sedation. Pt was reportedly found wandering the streets of [place] altered. Pt was brought in by EMS with slurred speech and confusion. He was orginally evaluated as code stroke that was negative. Pt's mental status failed to improve and agitation was difficult to allow for evaluation and work up so he was subsequently intubated for airway protection from 10/30-11/6. Follow up MRI on 11/3 revealed "scattered small new acute infarcts in the bilateral cerebral and cerebellar hemispheres, favored embolic in etiology".   Assessment / Plan / Recommendation  Clinical Impression  Pt alert, though unwilling to participate in therapy. Fortunately, family member at bedside was able to encourage his participation. She reported pt's speech and cognitive function appears to be at baseline, though speech informally observed to be mildly slurred with reduced intelligibility to this unfamiliar listener.    Pt presents with a grossly functional oropharyngeal swallow function with PO trials pt was willing to accept during clinical swallow evaluation today. Pt accepted trials of thin water/soda (>6oz total), applesauce (1-2 oz), and diced peaches (2-3 oz).  Oral prep and transit observed to be prompt with these consistencies with complete oral clearance. Pharyngeal swallow initiation appeared  timely with laryngeal elevation noted. No overt or subtle s/s of aspiration observed across trials. No change in pt's vocal quality or O2 sats observed.   Recommend a mechanical altered diet and thin liquids as tolerated. Okay for PO meds as tolerated. Unable to assess pt's tolerance of a regular solid due to his refusal despite education.  Therefore, recommend starting with a modified diet and advancing as tolerated. Will consider instrumental swallow assessment if concerns for aspiration arise with PO intake; however, do not anticipate pt would participate in an instrumental swallow assessment based on interaction today.   Plan: SLP will follow up to assess diet tolerance and modify or advance diet as indicated. Consider speech/cognitive-linguistic assessment if pt is willing to participate.   SLP Visit Diagnosis:  (r/o dysphagia)    Aspiration Risk  Mild aspiration risk    Diet Recommendation Dysphagia 2 (Fine chop);Thin liquid    Liquid Administration via: Cup;Straw Medication Administration: Whole meds with liquid Supervision: Staff to assist with self feeding;Full supervision/cueing for compensatory strategies Compensations: Minimize environmental distractions;Slow rate Postural Changes: Seated upright at 90 degrees    Other  Recommendations Oral Care Recommendations: Oral care BID    Recommendations for follow up therapy are one component of a multi-disciplinary discharge planning process, led by the attending physician.  Recommendations may be updated based on patient status, additional functional criteria and insurance authorization.  Follow up Recommendations  (TBD)      Assistance Recommended at Discharge    Functional Status Assessment Patient has had a recent decline in their functional status and demonstrates the ability to make significant improvements in function in a reasonable and predictable amount of time.  Frequency and Duration min 1 x/week  1 week        Prognosis Prognosis for improved oropharyngeal function: Fair Barriers to Reach Goals: Behavior;Motivation      Swallow Study   General Date of Onset: 02/11/23 HPI: 57 yo male who's pmh and hpi are severely limited 2/2 acute encephalopathy and resp failure now on ventilator and sedation. Pt was reportedly found wandering the streets of [place] altered. Pt was brought in by EMS with slurred speech and confusion. He was orginally evaluated as code stroke that was negative. Pt's mental status failed to improve and agitation was difficult to allow for evaluation and work up so he was subsequently intubated for airway protection from 10/30-11/6. Type of Study: Bedside Swallow Evaluation Previous Swallow Assessment: none per chart Diet Prior to this Study: NPO Temperature Spikes Noted: Yes (most recent temp WNL; temp spikes earlier this AM) Respiratory Status: Room air History of Recent Intubation: Yes Total duration of intubation (days): 7 days Date extubated: 02/19/23 Behavior/Cognition: Alert;Agitated;Impulsive;Uncooperative;Requires cueing;Doesn't follow directions Oral Cavity Assessment:  (unable to assess) Oral Care Completed by SLP: No Oral Cavity - Dentition: Missing dentition;Poor condition (does not wear dentures) Vision:  (unable to assess) Patient Positioning: Upright in bed Baseline Vocal Quality: Normal    Oral/Motor/Sensory Function Overall Oral Motor/Sensory Function: Within functional limits (unable to formally assess; informally appeared [facility])   Ice Chips Ice chips: Not tested   Thin Liquid Thin Liquid: Within functional limits    Nectar Thick Nectar Thick Liquid: Not tested   Honey Thick Honey Thick Liquid: Not tested   Puree Puree: Within functional limits   Solid     Solid: Within functional limits Other Comments: soft solid-diced peaches. Declined cracker      Ellery Plunk 02/20/2023,9:22 AM

## 2023-02-20 NOTE — Progress Notes (Addendum)
Nutrition Follow-up  DOCUMENTATION CODES:   Severe malnutrition in context of social or environmental circumstances  INTERVENTION:   Increase Osmolite 1.5 to 65 ml/hr (1560 ml) 60 ml ProSource TF20 BID  Provides: 2500 kcal, 137 grams protein, and 1185 ml free water.   200 ml every 4 hours Total free water: 2385 ml   Continue MVI, folic acid, thiamine Supplement K, Phos as needed  Diet just advanced Ensure Enlive po BID, each supplement provides 350 kcal and 20 grams of protein.   NUTRITION DIAGNOSIS:   Severe Malnutrition related to social / environmental circumstances as evidenced by severe fat depletion, severe muscle depletion. Ongoing, being addressed by TF  GOAL:   Patient will meet greater than or equal to 90% of their needs Met with TF at goal   MONITOR:   TF tolerance  REASON FOR ASSESSMENT:   Consult Enteral/tube feeding initiation and management  ASSESSMENT:   Pt with PMH of sickle cell, chronic pain, retinal hemorrhage, tobacco use, MJ use, vitamin D deficiency, and GERD admitted with acute encephalopathy, found wandering [place] altered. Pt intubated.  Pt discussed during ICU rounds and with RN. Per RN pt has been shivering due to being on a cooling blanket from uncontrolled fevers. T max 103.2 Pt started on buspar, demerol, tylenol and cooling blanket stopped.  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  Family at bedside. Per NP very weak lower extremities, plan for therapies today.  10/29 - admitted with AMS 10/30 - intubated, LP attempted at bedside and in IR, unsuccessful 11/3 Repeat MRI shows scattered new small acute infarcts bilateral cerebral and cerebellar  11/4 - LP in IR, results negative  11/5 - TEE, noted presence of R to L shunt, stroke etiology likely paradoxical embolism  11/6 - extubated; cortrak tube placed;  tip in distal stomach per xray 11/7 - diet advanced by SLP to Dysphagia 3 with thin liquids  Medications reviewed and include: buspar, folic acid, SSI, MVI with minerals, thiamine 100 mg daily 15 mmol Kphos x 1  Labs reviewed: Na 150 K 3.6 PO4 2.0 <- 3.3 CBG"s: 126-129  Current weight: 68.2 kg Admission weight: 82.6 kg  Diet Order:   Diet Order             Diet NPO time specified  Diet effective now                   EDUCATION NEEDS:   Not appropriate for education at this time  Skin:  Skin Assessment: Reviewed RN Assessment  Last BM:  1000 ml via FMS - lactulose stopped  Height:   Ht Readings from Last 1 Encounters:  02/15/23 6' 0.01" (1.829 m)    Weight:   Wt Readings from Last 1 Encounters:  02/20/23 68.2 kg    Ideal Body Weight:  80.9 kg  BMI:  Body mass index is 20.39 kg/m.  Estimated Nutritional Needs:   Kcal:  2100-2400  Protein:  120-140 grams  Fluid:  >2 L/day  Cammy Copa., RD, LDN, CNSC See AMiON for contact information

## 2023-02-20 NOTE — Progress Notes (Signed)
NAME:  Peter Kline, MRN:  [id number], DOB:  [birth year], LOS: 8 ADMISSION DATE:  02/11/2023, CONSULTATION DATE:  02/12/2023 REFERRING MD:  EDP, CHIEF COMPLAINT:  acute respiratory failure   History of Present Illness:  57 year old male with past medical history of sickle cell c/b priapism, GERD, chronic pain, retinal hemorrhage, tobacco use, marijuana use, vitamin D deficiency  presented to the emergency department on 10/30 with altered mental status. Was reportedly found wandering streets of [place] altered. He was brought in by EMS with slurred speech and confusion so was evaluated as a code stroke. This was negative. Mental status failed to improve, became agitated and eventually intubated for airway protection. In ED had chest xray concerning for multifocal pneumonia, leukocytosis to 30, ethanol negative, UDS + THC, UA negative, ammonia negative, resp panel negative Pertinent  Medical History  sickle cell c/b priapism, GERD, chronic pain, retinal hemorrhage, tobacco use, marijuana use, vitamin D deficiency  Significant Hospital Events: Including procedures, antibiotic start and stop dates in addition to other pertinent events   10/30: admitted for AMS, resp failure, intubated  10/31: cLTM 11/1: small aspiration event from emesis, increased secretions, neuro consulted  11/2: agitated and further aspiration event 11/3: repeat MRI with scattered new infarcts in bilateral cerebral and cerebellar hemispheres - embolic  11/5: bilateral LE DVT +  11/6: started on heparin gtt, zosyn started yesterday. Getting TTE bubble study for showered MRI. CXR this am with what looks to be infarcts. Extubated in PM  11/7: transfer to [facility] tomorrow. + vanc with ongoing fevers. Mental status improving. PT/OT/speech. Lower extremities weak, considering spinal imaging if he does not improve with PT.   Interim History / Subjective:  Awake, alert. He is somewhat confused to events leading up to hospitalization.    Objective   Blood pressure 105/64, pulse (!) 101, temperature (!) 100.5 F (38.1 C), temperature source Axillary, resp. rate (!) 28, height 6' 0.01" (1.829 m), weight 68.2 kg, SpO2 98%.    Vent Mode: PSV;CPAP FiO2 (%):  [30 %-32 %] 32 % Set Rate:  [18 bmp] 18 bmp Vt Set:  [620 mL] 620 mL PEEP:  [5 cmH20] 5 cmH20 Pressure Support:  [10 cmH20] 10 cmH20 Plateau Pressure:  [22 cmH20] 22 cmH20   Intake/Output Summary (Last 24 hours) at 02/20/2023 0732 Last data filed at 02/20/2023 0700 Gross per 24 hour  Intake 2100.71 ml  Output 2005 ml  Net 95.71 ml   Filed Weights   02/18/23 0444 02/19/23 0500 02/20/23 0500  Weight: 71.3 kg 70.6 kg 68.2 kg    Examination: General: middle aged male, awake, alert, oriented to self, place HENT: EOMI, anicteric sclera, mucous membranes moist  Lungs: decreased bases, clear uppers  Cardiovascular: s1/s2 without murmur, rub, gallop  Abdomen: soft, +BS, cortrak in place  Extremities: no significant edema Neuro: spontaneously moves upper extremities against gravity. Lower extremities he withdraws to painful stimulus, very weak, sensation intact. No facial droop or dysarthria  GU: foley   Resolved Hospital Problem list   Acute hypoxic resp failure Hypokalemia  Assessment & Plan:  Acute encephalopathy; multifactorial.  Multiple embolic CVA;  Acute bilateral LE DVT  Appears to be multifactorial. Likely polysubstance abuse leading to immobilization. Developed DVTs and showered them given +PFO on echo with multiple small strokes on MRI, pulmonary infarcts on CXR. He likely aspirated at some point leading to his pneumonia on arrival.  - con't heparin gtt, will need transition to Eliquis during hospitalization  - d/c lactulose today, mental  status clearing and large BM - very weak lower extremities. If he does not improve, likely will need spinal imaging  - empiric thiamine  - lights on day/off night  - PT/OT/Speech ordered   Hypernatremia; likely  insensible losses, dehydration. - free water increased to 200cc q4h - trend BMP  Acute hypoxic respiratory failure- resolving; CXR with multifocal pneumonia on admission. Hospitalization with aspiration event. Completed unasyn, rocephin - CXR 11/6 AM with ?infarct appearing opacities, likely from DVTs  - con't zosyn, add Vancomycin 11/7 for ongoing fever (may be neuro component to this) - wean nasal cannula to O2 >92% - incentive spirometry   History of sickle cell disease; has associated retinopathy, chronic pain, priapism, splenic sequestration - no evidence of flare at this time  History of GERD  - receiving H2B here for critical illness.   Best Practice (right click and "Reselect all SmartList Selections" daily)   Diet/type: tubefeeds DVT prophylaxis: systemic heparin GI prophylaxis: N/A Lines: N/A Foley:  removal ordered  Code Status:  full code Last date of multidisciplinary goals of care discussion [updated patient and fiance at bedside 11/7]  Labs   CBC: Recent Labs  Lab 02/16/23 0733 02/17/23 0430 02/18/23 1041 02/19/23 0545 02/20/23 0301  WBC 17.4* 15.3* 19.0* 21.5* 22.6*  HGB 10.1* 9.7* 10.4* 9.8* 10.4*  HCT 28.5* 27.5* 29.5* 27.6* 29.5*  MCV 78.3* 78.1* 79.5* 78.0* 77.4*  PLT 154 164 220 208 267    Basic Metabolic Panel: Recent Labs  Lab 02/13/23 1652 02/14/23 0859 02/15/23 0213 02/16/23 0733 02/17/23 0430 02/18/23 0625 02/19/23 0545 02/20/23 0301  NA  --    < > 145 147* 147* 147* 147* 150*  K  --    < > 4.2 3.7 3.7 3.8 3.3* 3.6  CL  --    < > 115* 109 111 108 107 114*  CO2  --    < > 24 26 26 24 27 26   GLUCOSE  --    < > 113* 101* 124* 94 79 134*  BUN  --    < > 13 14 14 18  21* 21*  CREATININE  --    < > 0.79 0.83 0.79 0.89 0.78 0.91  CALCIUM  --    < > 8.6* 9.0 8.7* 8.7* 8.7* 8.8*  MG 2.5*  --  2.3  --  2.3 2.5* 2.6* 3.0*  PHOS 3.3  --   --   --   --   --   --  2.0*   < > = values in this interval not displayed.   GFR: Estimated  Creatinine Clearance: 85.4 mL/min (by C-G formula based on SCr of 0.91 mg/dL). Recent Labs  Lab 02/17/23 0430 02/18/23 0625 02/18/23 1041 02/19/23 0545 02/20/23 0301  PROCALCITON  --  0.47  --   --  1.19  WBC 15.3*  --  19.0* 21.5* 22.6*    Liver Function Tests: Recent Labs  Lab 02/14/23 0859 02/17/23 0851  AST 30 35  ALT 23 47*  ALKPHOS 74 123  BILITOT 0.8 1.0  PROT 6.2* 7.1  ALBUMIN 2.8* 2.6*   No results for input(s): "LIPASE", "AMYLASE" in the last 168 hours. Recent Labs  Lab 02/14/23 0859 02/16/23 0753  AMMONIA 39* 53*    ABG    Component Value Date/Time   PHART 7.277 (L) 02/12/2023 0132   PCO2ART 49.6 (H) 02/12/2023 0132   PO2ART 276 (H) 02/12/2023 0132   HCO3 23.3 02/12/2023 0132   TCO2 25 02/12/2023 0132  ACIDBASEDEF 4.0 (H) 02/12/2023 0132   O2SAT 99.3 02/14/2023 1757     Coagulation Profile: No results for input(s): "INR", "PROTIME" in the last 168 hours.  Cardiac Enzymes: Recent Labs  Lab 02/19/23 0537  CKTOTAL 51    HbA1C: Hgb A1c MFr Bld  Date/Time Value Ref Range Status  02/12/2023 06:26 AM 4.8 4.8 - 5.6 % Final    Comment:    (NOTE)         Prediabetes: 5.7 - 6.4         Diabetes: >6.4         Glycemic control for adults with diabetes: <7.0   10/01/2013 01:29 PM 4.4 <5.7 % Final    Comment:                                                                           According to the ADA Clinical Practice Recommendations for 2011, when HbA1c is used as a screening test:     >=6.5%   Diagnostic of Diabetes Mellitus            (if abnormal result is confirmed)   5.7-6.4%   Increased risk of developing Diabetes Mellitus   References:Diagnosis and Classification of Diabetes Mellitus,Diabetes Care,2011,34(Suppl 1):S62-S69 and Standards of Medical Care in         Diabetes - 2011,Diabetes Care,2011,34 (Suppl 1):S11-S61.      CBG: Recent Labs  Lab 02/19/23 1502 02/19/23 1922 02/19/23 2309 02/20/23 0311 02/20/23 0729  GLUCAP  88 100* 124* 126* 129*    Review of Systems:   As above  Past Medical History:  He,  has a past medical history of Shortness of breath and Sickle cell anemia (HCC).   Surgical History:   Past Surgical History:  Procedure Laterality Date   LAPAROSCOPIC GASTROTOMY W/ REPAIR OF ULCER       Social History:   reports that he has been smoking cigarettes. He has never used smokeless tobacco. He reports current alcohol use of about 6.0 standard drinks of alcohol per week. He reports current drug use. Drug: Marijuana.   Family History:  His family history includes Diabetes in his mother; Hypertension in his mother.   Allergies No Known Allergies   Home Medications  Prior to Admission medications   Medication Sig Start Date End Date Taking? Authorizing Provider  folic acid (FOLVITE) 1 MG tablet Take 1 tablet (1 mg total) by mouth daily. 03/22/19  Yes Massie Maroon, FNP  gabapentin (NEURONTIN) 300 MG capsule Take 1 capsule (300 mg total) by mouth 3 (three) times daily. 03/22/19  Yes Massie Maroon, FNP     Critical care time: 35    [facility] 02/20/2023, 7:32 AM  Please see [email or web address] for pager details.  From 7A-7P if no response, please call [phone]. After hours, please call ELink [phone].

## 2023-02-20 NOTE — Progress Notes (Signed)
[facility] ADULT ICU REPLACEMENT PROTOCOL   The patient does apply for the [facility] Adult ICU Electrolyte Replacment Protocol based on the criteria listed below:   1.Exclusion criteria: TCTS, ECMO, Dialysis, and Myasthenia Gravis patients 2. Is GFR >/= 30 ml/min? Yes.    Patient's GFR today is >60 3. Is SCr </= 2? Yes.   Patient's SCr is 0.91 mg/dL 4. Did SCr increase >/= 0.5 in 24 hours? No. 5.Pt's weight >40kg  Yes.   6. Abnormal electrolyte(s): K+ = 3.6, Phos = 2.0  7. Electrolytes replaced per protocol 8.  Call MD STAT for K+ </= 2.5, Phos </= 1, or Mag </= 1 Physician:  Delia Chimes, eMD  Faye Ramsay 02/20/2023 3:39 AM

## 2023-02-20 NOTE — Progress Notes (Signed)
Pharmacy Antibiotic Note  Peter Kline is a 57 y.o. male admitted on 02/11/2023 with continued aspiration with possible pneumonia. Discussed with MD on 11/5, MRSA swab negative 11/4, all blood cultures negative. Plan to hold vancomycin. Trach aspirate culture from 11/5 now growing moderate Staphylococcus epidermidis. Pharmacy has been consulted for vancomycin and Zosyn (piperacillin/tazobactam) dosing.   WBC trending up to 22.6 and Tmax 103.2 despite Zosyn for 2 days SCr 0.91   Plan: Initiate loading dose of Vancomycin 1500mg  IV x 1, followed by  Vancomycin 1500mg  IV q12h (eAUC ~529)    > Goal AUC 400-550    > Check vancomycin levels at steady state  Continue Zosyn 3.375g q8h EI Monitor daily CBC, temp, SCr, and for clinical signs of improvement  F/u cultures and de-escalate antibiotics as able   Height: 6' 0.01" (182.9 cm) Weight: 68.2 kg (150 lb 5.7 oz) IBW/kg (Calculated) : 77.62  Temp (24hrs), Avg:100.7 F (38.2 C), Min:99.3 F (37.4 C), Max:103.2 F (39.6 C)  Recent Labs  Lab 02/16/23 0733 02/17/23 0430 02/18/23 0625 02/18/23 1041 02/19/23 0545 02/20/23 0301  WBC 17.4* 15.3*  --  19.0* 21.5* 22.6*  CREATININE 0.83 0.79 0.89  --  0.78 0.91    Estimated Creatinine Clearance: 85.4 mL/min (by C-G formula based on SCr of 0.91 mg/dL).    No Known Allergies  Antimicrobials this admission: CTX 10/30 >>11/1, 11/4 >> 11/5 Doxy 10/31 >> 11/1 Acyclovir 10/30  Ampicillin 10/30  Vancomycin 10/30  Decadron (meningitis) 10/30 Unasyn 11/1 >> 11/4 Piptazo 11/5 >>  Vanc 11/7 >>   Microbiology:  10/29 MRSA neg 10/29 strep pneumo urine neg 10/29 Bcx: negative 10/31 TA - negative 11/3 BCx - ngtd 11/4 CSF - ngtd 11/4 MRSA neg  11/5 TA - mod Staphylococcus epidermidis (sensitivities pending)   Thank you for allowing pharmacy to be a part of this patient's care.  Wilburn Cornelia, PharmD, BCPS Clinical Pharmacist 02/20/2023 7:50 AM   Please refer to AMION for pharmacy  phone number

## 2023-02-20 NOTE — Progress Notes (Addendum)
STROKE TEAM PROGRESS NOTE   BRIEF HPI [name] is a  57 y.o. male  has a past medical history of Shortness of breath and Sickle cell anemia (HCC). who presented was found wandering the streets confused and got intubated for persistent agitation. Etiology is unclear. MRI brain, cEEG unrevealing, failed LP attempts, being treated for aspiration pneumonia. Difficulty weaning off sedation due to agitaition and vent dyssnychrony.   SIGNIFICANT HOSPITAL EVENTS 11/3 Repeat MRI showed scattered new small acute infarcts bilateral cerebral and cerebellar. CTA negative.  11/4: LP completed by CCM. Results negative.   INTERIM HISTORY/SUBJECTIVE RN at the bedside. Family at the bedside.  Patient was extubated yesterday.  He is on Heparin gtt.  Vanc was added today due to continued fevers T max 103.2 and worsening WBC 22.6  Exam he is not answering questions, not following commands, generalized weakness all over Patient has been cleared for a p.o. diet but has not been eating well.  He has been refusing his medication and has been agitated OBJECTIVE  CBC    Component Value Date/Time   WBC 22.6 (H) 02/20/2023 0301   RBC 3.81 (L) 02/20/2023 0301   HGB 10.4 (L) 02/20/2023 0301   HGB 13.6 11/25/2018 1534   HCT 29.5 (L) 02/20/2023 0301   HCT 41.1 11/25/2018 1534   PLT 267 02/20/2023 0301   PLT 198 11/25/2018 1534   MCV 77.4 (L) 02/20/2023 0301   MCV 87 11/25/2018 1534   MCH 27.3 02/20/2023 0301   MCHC 35.3 02/20/2023 0301   RDW 15.4 02/20/2023 0301   RDW 15.6 (H) 11/25/2018 1534   LYMPHSABS 1.5 02/11/2023 2007   LYMPHSABS 2.7 09/03/2017 1402   MONOABS 2.3 (H) 02/11/2023 2007   EOSABS 0.1 02/11/2023 2007   EOSABS 0.4 09/03/2017 1402   BASOSABS 0.1 02/11/2023 2007   BASOSABS 0.1 09/03/2017 1402    BMET    Component Value Date/Time   NA 150 (H) 02/20/2023 0301   NA 137 11/25/2018 1534   K 3.6 02/20/2023 0301   CL 114 (H) 02/20/2023 0301   CO2 26 02/20/2023 0301   GLUCOSE 134  (H) 02/20/2023 0301   BUN 21 (H) 02/20/2023 0301   BUN 7 11/25/2018 1534   CREATININE 0.91 02/20/2023 0301   CREATININE 0.92 05/02/2014 1430   CALCIUM 8.8 (L) 02/20/2023 0301   GFRNONAA >60 02/20/2023 0301   GFRNONAA >89 05/02/2014 1430    IMAGING past 24 hours No results found.  Vitals:   02/20/23 0900 02/20/23 1000 02/20/23 1100 02/20/23 1200  BP: 98/65 106/70 133/89 131/82  Pulse: 86 88 (!) 103 (!) 104  Resp: (!) 28 (!) 33 (!) 24 (!) 25  Temp:    (!) 100.8 F (38.2 C)  TempSrc:    Axillary  SpO2: 93% 92% 90% 94%  Weight:      Height:         General: Critically ill appearing African American male laying in ICU bed, in no acute distress t HENT: atraumatic  Head: Normocephalic Cardiovascular: Normal rate and regular rhythm.  Respiratory: regular and even unlabored  GI: Soft.  No distension. There is no tenderness.  Skin: WDI.    NEURO Mental status/Cognition: Spontaneous,Eyes open, not answering questions, not following commands .  Patient is not speaking. Speech/language:limited, no dysarthria  Cranial nerves:   CN II Ovoid right pupil > round left pupil, both sluggishly reactive to light   CN III,IV,VI Right gaze preference, dolls eyes reflex is intact, does not  fixate or track.    CN V Corneals intact BL to eyelash brush, blinks to threat bilaterally    CN VII Facial symmetry   CN VIII Unable to assess due to patient's condition   CN IX & X Cough intact, gag not assessed   CN XI Nare stimulation not completed today   CN XII Does not protrude tongue to command    Sensory/Motor:  Localization in bilateral uppers and slight withdrawal to bilateral lowers  Mild tremors noted in all 4 extremities ASSESSMENT/PLAN  Acute Ischemic Infarct:  bilateral cerebral and cerebellar hemispheres Etiology: Paradoxical embolism due to right to left cardiac shunt and presence of DVT with some concurrent small vessel disease .  Neurological exam likely complicated by  encephalopathy from sepsis and pneumonia 10/29 CODE STROKE CT head No acute abnormality. ASPECTS 10.     10/31 MRI   No evidence of intracranial infection.  Left Occipital white matter acute infarct Small chronic infratentorial infarcts including left pons Left frontal lobe encephalomalacia 11/3 MRI Scattered new small acute infarcts bilateral cerebral and cerebellar hemispheres.   LTM EEG 11/2 1008 to 11/2 1340: suggestive of moderate to severe diffuse encephalopathy. No seizures seen.  2D Echo: EF 55-60%, Grade II diastolic dysfunction, Moderately dilated left ateria, Mild to Moderate MVR TEE: EF 55-60%. Bubble study was positive, suggesting a small PFO  [place] LE acute DVT in left posterior tibial and peroneal veins. Will start heparin gtt today   LDL 49 HgbA1c 4.8 VTE prophylaxis -heparin IV  No antithrombotic prior to admission, now on heparin IV .  Transition to oral anticoagulation when patient able to swallow Therapy recommendations:  Pending Disposition:  pending  Acute Encephalopathy Secondary to alcohol abuse, hyperammonemia CIWA protocol in place Lactulose ordered Currently intubated and sedated d/t increased agitation and continued AMS  Acute Respiratory Failure ?Aspiration Pneumonia Requiring mechanical ventilation VAP bundle Wean sedation as able Completed Unasyn and ceftriaxone  On Zosyn  Van added today with worsening leukocytosis and febrile  Respiratory Culture sent 11/5  Hx of Hypertension Home meds:  none Stable, on lower end BP goal normotensive, as out of permissive hypertension window due to unclear LKW  Hyperlipidemia Home meds:  none LDL 49, goal < 70 High intensity statin not indicated due to LDL within goal   Diabetes type II, no history Home meds:  none HgbA1c 4.8, goal < 7.0 CBGs SSI  Tobacco Abuse Current smoker      Ready to quit? N/A Nicotine patch ordered  On thiamine and folate   Substance Abuse Patient uses marijuana UDS  positive for  THC      Ready to quit? N/A  Dysphagia Patient has post-stroke dysphagia, SLP consulted    Diet   DIET DYS 2 Room service appropriate? Yes; Fluid consistency: Thin   Advance diet as tolerated On TFs  Passed for diet, poor po intake   Other Stroke Risk Factors ETOH abuse, alcohol level <10, advised to drink no more than 1-2 drink(s) a day CIWA protocol Hx of Sickle Cell disease  Other Active Problems, per primary team GERD Fever, tMax 103.2 WBC 21.6 -> 17.6 -> 21.0 -> 14.6 -> 17.4 -> 15.3 -> 19.0->21.5->22.6 Currently on zosyn and Vanc Anemia, Hbg 9.7 -> 10.4->9.8->10.4 Hypokalemia K 3.3- replaced Hypernatremia, Na 138->141->145->147->147->150 Free Water flushes Q4H-->Q6H  Hospital day # 8   Pt seen by Neuro NP/APP and later by MD. Note/plan to be edited by MD as needed.    Gevena Mart DNP,  ACNPC-AG  [facility] I have personally obtained history,examined this patient, reviewed notes, independently viewed imaging studies, participated in medical decision making and plan of care.ROS completed by me personally and pertinent positives fully documented  I have made any additions or clarifications directly to the above note. Agree with note above.  Patient was extubated yesterday with neurological exam remains compromised by sedation for agitation as well as encephalopathy from his pneumonia and sepsis.  He remained febrile with high white count and is on antibiotics.  Encourage oral feeds for the patient not adequately eating and may need tube feeds.  Continue IV heparin for now and transition to Eliquis only when patient is able to eat reliably and consistently..  Discussed with Drs. Smith critical care medicine.This patient is critically ill and at significant risk of neurological worsening, death and care requires constant monitoring of vital signs, hemodynamics,respiratory and cardiac monitoring, extensive review of multiple databases, frequent  neurological assessment, discussion with family, other specialists and medical decision making of high complexity.I have made any additions or clarifications directly to the above note.This critical care time does not reflect procedure time, or teaching time or supervisory time of PA/NP/Med Resident etc but could involve care discussion time.  I spent 30 minutes of neurocritical care time  in the care of  this patient.      Delia Heady, MD Medical Director [facility] Pager: [phone] 02/20/2023 2:07 PM   To contact Stroke Continuity provider, please refer to [email or web address]. After hours, contact General Neurology

## 2023-02-20 NOTE — Progress Notes (Signed)
PHARMACY - ANTICOAGULATION CONSULT NOTE  Pharmacy Consult:  Heparin >> enox  Indication: DVT  No Known Allergies  Patient Measurements: Height: 6' 0.01" (182.9 cm) Weight: 68.2 kg (150 lb 5.7 oz) IBW/kg (Calculated) : 77.62 Heparin Dosing Weight: 70 kg  Vital Signs: Temp: 100.4 F (38 C) (11/07 1447) Temp Source: Oral (11/07 1447) BP: 110/71 (11/07 1447) Pulse Rate: 87 (11/07 1447)  Labs: Recent Labs    02/18/23 0625 02/18/23 1041 02/18/23 1041 02/19/23 0537 02/19/23 0545 02/19/23 1613 02/20/23 0301  HGB  --  10.4*   < >  --  9.8*  --  10.4*  HCT  --  29.5*  --   --  27.6*  --  29.5*  PLT  --  220  --   --  208  --  267  HEPARINUNFRC  --   --   --   --   --  <0.10* <0.10*  CREATININE 0.89  --   --   --  0.78  --  0.91  CKTOTAL  --   --   --  51  --   --   --    < > = values in this interval not displayed.    Estimated Creatinine Clearance: 85.4 mL/min (by C-G formula based on SCr of 0.91 mg/dL).   Medical History: Past Medical History:  Diagnosis Date   Shortness of breath    Sickle cell anemia (HCC)     Assessment: 69 YOM with multifactorial encephalopathy.  Pharmacy consulted to dose IV heparin per stroke protocol for acute L DVT and age indeterminate R DVT.  Patient has multiple embolic CVAs.  No anticoagulation prior to admission.   Heparin levels have been subtherapeutic for 24 hours. Difficulty getting levels on time due to staffing. Discussed with MD who was ok with switching to enoxaparin.  Team planning to swtich to apixaban when can take PO meds.  Goal of Therapy:  Heparin level 0.3-0.5 units/ml Monitor platelets by anticoagulation protocol: Yes   Plan:   Stop  Heparin  Start enoxaparin 70mg  q12hr  Monitor signs/symptoms of bleeding  F/u switch to apixaban   Alphia Moh, PharmD, BCPS, BCCP Clinical Pharmacist  Please check AMION for all [facility] Pharmacy phone numbers After 10:00 PM, call Main Pharmacy [phone]

## 2023-02-20 NOTE — Progress Notes (Signed)
PHARMACY - ANTICOAGULATION CONSULT NOTE  Pharmacy Consult:  Heparin Indication: DVT  No Known Allergies  Patient Measurements: Height: 6' 0.01" (182.9 cm) Weight: 70.6 kg (155 lb 10.3 oz) IBW/kg (Calculated) : 77.62 Heparin Dosing Weight: 70 kg  Vital Signs: Temp: 103.2 F (39.6 C) (11/07 0000) Temp Source: Bladder (11/07 0000) BP: 131/99 (11/07 0100) Pulse Rate: 118 (11/07 0100)  Labs: Recent Labs    02/18/23 0625 02/18/23 1041 02/19/23 0537 02/19/23 0545 02/19/23 1613 02/20/23 0301  HGB  --  10.4*  --  9.8*  --  10.4*  HCT  --  29.5*  --  27.6*  --  29.5*  PLT  --  220  --  208  --  267  HEPARINUNFRC  --   --   --   --  <0.10* <0.10*  CREATININE 0.89  --   --  0.78  --  0.91  CKTOTAL  --   --  51  --   --   --     Estimated Creatinine Clearance: 88.4 mL/min (by C-G formula based on SCr of 0.91 mg/dL).   Medical History: Past Medical History:  Diagnosis Date   Shortness of breath    Sickle cell anemia (HCC)     Assessment: 51 YOM with multifactorial encephalopathy.  Pharmacy consulted to dose IV heparin per stroke protocol for acute L DVT and age indeterminate R DVT.  Patient has multiple embolic CVAs.  No anticoagulation prior to admission.   Earlier in the day, RN reported moderate bleeding at two PIV sites, blood is staining his gown. RN removed dressing and bleeding was stopped.   11/7 AM: heparin level returned at <0.1 on 950 units/hr (subtherapeutic). Per RN, no bleeding and has stopped from earlier on 11/6. CBC showing slight improvement   Goal of Therapy:  Heparin level 0.3-0.5 units/ml Monitor platelets by anticoagulation protocol: Yes   Plan:   Increase Heparin gtt to 1050 units/hr  F/u 8hr heparin level Monitor daily heparin level, CBC, signs/symptoms of bleeding   Arabella Merles, PharmD. Clinical Pharmacist 02/20/2023 4:10 AM

## 2023-02-21 ENCOUNTER — Inpatient Hospital Stay (HOSPITAL_COMMUNITY): Payer: 59

## 2023-02-21 DIAGNOSIS — I6300 Cerebral infarction due to thrombosis of unspecified precerebral artery: Secondary | ICD-10-CM | POA: Diagnosis not present

## 2023-02-21 DIAGNOSIS — R6520 Severe sepsis without septic shock: Secondary | ICD-10-CM

## 2023-02-21 DIAGNOSIS — J189 Pneumonia, unspecified organism: Secondary | ICD-10-CM | POA: Diagnosis not present

## 2023-02-21 DIAGNOSIS — G9340 Encephalopathy, unspecified: Secondary | ICD-10-CM | POA: Diagnosis not present

## 2023-02-21 DIAGNOSIS — J9601 Acute respiratory failure with hypoxia: Secondary | ICD-10-CM | POA: Diagnosis not present

## 2023-02-21 DIAGNOSIS — R4182 Altered mental status, unspecified: Secondary | ICD-10-CM | POA: Diagnosis not present

## 2023-02-21 DIAGNOSIS — G9341 Metabolic encephalopathy: Secondary | ICD-10-CM

## 2023-02-21 LAB — CSF CULTURE W GRAM STAIN
Culture: NO GROWTH
Gram Stain: NONE SEEN

## 2023-02-21 LAB — CBC WITH DIFFERENTIAL/PLATELET
Abs Immature Granulocytes: 0 10*3/uL (ref 0.00–0.07)
Basophils Absolute: 0.2 10*3/uL — ABNORMAL HIGH (ref 0.0–0.1)
Basophils Relative: 1 %
Eosinophils Absolute: 0.6 10*3/uL — ABNORMAL HIGH (ref 0.0–0.5)
Eosinophils Relative: 3 %
HCT: 27.9 % — ABNORMAL LOW (ref 39.0–52.0)
Hemoglobin: 9.7 g/dL — ABNORMAL LOW (ref 13.0–17.0)
Lymphocytes Relative: 15 %
Lymphs Abs: 3.2 10*3/uL (ref 0.7–4.0)
MCH: 26.8 pg (ref 26.0–34.0)
MCHC: 34.8 g/dL (ref 30.0–36.0)
MCV: 77.1 fL — ABNORMAL LOW (ref 80.0–100.0)
Monocytes Absolute: 0.8 10*3/uL (ref 0.1–1.0)
Monocytes Relative: 4 %
Neutro Abs: 16.3 10*3/uL — ABNORMAL HIGH (ref 1.7–7.7)
Neutrophils Relative %: 77 %
Platelets: 287 10*3/uL (ref 150–400)
RBC: 3.62 MIL/uL — ABNORMAL LOW (ref 4.22–5.81)
RDW: 15.7 % — ABNORMAL HIGH (ref 11.5–15.5)
WBC: 21.2 10*3/uL — ABNORMAL HIGH (ref 4.0–10.5)
nRBC: 0 /100{WBCs}
nRBC: 0.3 % — ABNORMAL HIGH (ref 0.0–0.2)

## 2023-02-21 LAB — CULTURE, BLOOD (ROUTINE X 2)
Culture: NO GROWTH
Culture: NO GROWTH

## 2023-02-21 LAB — MAGNESIUM: Magnesium: 2.6 mg/dL — ABNORMAL HIGH (ref 1.7–2.4)

## 2023-02-21 LAB — CBC
HCT: 27.3 % — ABNORMAL LOW (ref 39.0–52.0)
Hemoglobin: 9.6 g/dL — ABNORMAL LOW (ref 13.0–17.0)
MCH: 27 pg (ref 26.0–34.0)
MCHC: 35.2 g/dL (ref 30.0–36.0)
MCV: 76.7 fL — ABNORMAL LOW (ref 80.0–100.0)
Platelets: 281 10*3/uL (ref 150–400)
RBC: 3.56 MIL/uL — ABNORMAL LOW (ref 4.22–5.81)
RDW: 15.4 % (ref 11.5–15.5)
WBC: 22.1 10*3/uL — ABNORMAL HIGH (ref 4.0–10.5)
nRBC: 0.2 % (ref 0.0–0.2)

## 2023-02-21 LAB — BASIC METABOLIC PANEL
Anion gap: 8 (ref 5–15)
BUN: 19 mg/dL (ref 6–20)
CO2: 25 mmol/L (ref 22–32)
Calcium: 8.3 mg/dL — ABNORMAL LOW (ref 8.9–10.3)
Chloride: 115 mmol/L — ABNORMAL HIGH (ref 98–111)
Creatinine, Ser: 1.06 mg/dL (ref 0.61–1.24)
GFR, Estimated: >60 mL/min (ref >60)
Glucose, Bld: 152 mg/dL — ABNORMAL HIGH (ref 70–99)
Potassium: 3.5 mmol/L (ref 3.5–5.1)
Sodium: 148 mmol/L — ABNORMAL HIGH (ref 135–145)

## 2023-02-21 LAB — GLUCOSE, CAPILLARY
Glucose-Capillary: 147 mg/dL — ABNORMAL HIGH (ref 70–99)
Glucose-Capillary: 159 mg/dL — ABNORMAL HIGH (ref 70–99)
Glucose-Capillary: 141 mg/dL — ABNORMAL HIGH (ref 70–99)
Glucose-Capillary: 124 mg/dL — ABNORMAL HIGH (ref 70–99)

## 2023-02-21 LAB — LACTIC ACID, PLASMA: Lactic Acid, Venous: 1.4 mmol/L (ref 0.5–1.9)

## 2023-02-21 LAB — PROCALCITONIN: Procalcitonin: 0.81 ng/mL

## 2023-02-21 MED ORDER — APIXABAN 5 MG PO TABS: 5.0000 mg | ORAL_TABLET | Freq: Two times a day (BID) | ORAL | Status: DC

## 2023-02-21 MED ORDER — APIXABAN 5 MG PO TABS
5.0000 mg | ORAL_TABLET | Freq: Two times a day (BID) | ORAL | Status: DC
  Administered 2023-02-21 – 2023-02-24 (×6): 5 mg
  Filled 2023-02-21 (×6): qty 1

## 2023-02-21 MED ORDER — IOHEXOL 350 MG/ML SOLN
80.0000 mL | Freq: Once | INTRAVENOUS | Status: AC | PRN
  Administered 2023-02-21: 80 mL via INTRAVENOUS

## 2023-02-21 NOTE — Progress Notes (Signed)
Physical Therapy Treatment Patient Details Name: Peter Kline MRN: [id number] DOB: 20-Oct-1965 Today's Date: 02/21/2023   History of Present Illness Pt is a 57 y/o male presenting to the ED on 10/30 with AMS, found wandering the streets of [facility] confused with slurred speech.  Initially negative for stroke.  Mental status failed to improve, pt became more agitated and eventually was intubated for airway protection.  Chest xray showed multifocal PNA.  11/1 had small aspiration event that led to further agitation and further aspiration, 11/3 repeat MRI showed scattered new infarct in bil cerebrum and cerebellum, 11/5  bil LE DVT+  PMHx:  sickle cell anemia, lap repair of gastric ulcer.    PT Comments  Pt making slow progress towards acute goals, limited by continued impaired cognition with pt unable to follow commands for purposeful mobility. Pt seen with OT to safely progress to EOB mobility however pt found to be soiled with initiation of mobility. Pt requiring total A +2 for bed mobility to roll R/L for peri-care with pt unable to follow commands to assist. Transport then arriving and truncating session to take pt to CT. Current plan remains appropriate to address deficits and maximize functional independence and decrease caregiver burden. Pt continues to benefit from skilled PT services to progress toward functional mobility goals.      If plan is discharge home, recommend the following: A lot of help with walking and/or transfers;A lot of help with bathing/dressing/bathroom;Assistance with cooking/housework;Direct supervision/assist for medications management;Direct supervision/assist for financial management;Assist for transportation;Help with stairs or ramp for entrance   Can travel by private vehicle        Equipment Recommendations  Other (comment) (TBD)    Recommendations for Other Services       Precautions / Restrictions Precautions Precautions: Fall Precaution Comments:  flexi-seal, condom cath, spits (put a mask on him) Restrictions Weight Bearing Restrictions: No     Mobility  Bed Mobility Overal bed mobility: Needs Assistance Bed Mobility: Rolling Rolling: +2 for physical assistance, Total assist         General bed mobility comments: total A +2 for all aspects to roll for peri-care with pt verbalizing in pain and increased cough in sidelying    Transfers                   General transfer comment: nt today as transport arriving to take pt to CT    Ambulation/Gait                   Stairs             Wheelchair Mobility     Tilt Bed    Modified Rankin (Stroke Patients Only)       Balance                                            Cognition Arousal: Lethargic, Alert (waxing/waning) Behavior During Therapy: Restless Overall Cognitive Status: Impaired/Different from baseline Area of Impairment: Orientation, Attention, Following commands, Safety/judgement, Awareness, Problem solving                 Orientation Level: Person Current Attention Level: Focused   Following Commands: Follows one step commands inconsistently Safety/Judgement: Decreased awareness of safety, Decreased awareness of deficits Awareness: Intellectual Problem Solving: Slow processing, Decreased initiation General Comments: pt unable to follow commands for purposful mobility,  moving UEs throughout        Exercises      General Comments General comments (skin integrity, edema, etc.): VSS, pt spouse present at bedside      Pertinent Vitals/Pain Pain Assessment Pain Assessment: Faces Faces Pain Scale: Hurts even more Pain Location: general, bottom with peri-care Pain Descriptors / Indicators: Grimacing, Guarding Pain Intervention(s): Monitored during session, Limited activity within patient's tolerance, Repositioned    Home Living                          Prior Function             PT Goals (current goals can now be found in the care plan section) Acute Rehab PT Goals Patient Stated Goal: none stated PT Goal Formulation: Patient unable to participate in goal setting Time For Goal Achievement: 03/06/23 Progress towards PT goals: Not progressing toward goals - comment (cognition)    Frequency    Min 1X/week      PT Plan      Co-evaluation PT/OT/SLP Co-Evaluation/Treatment: Yes Reason for Co-Treatment: For patient/therapist safety;Necessary to address cognition/behavior during functional activity PT goals addressed during session: Mobility/safety with mobility OT goals addressed during session: Strengthening/ROM      AM-PAC PT "6 Clicks" Mobility   Outcome Measure  Help needed turning from your back to your side while in a flat bed without using bedrails?: Total Help needed moving from lying on your back to sitting on the side of a flat bed without using bedrails?: Total Help needed moving to and from a bed to a chair (including a wheelchair)?: Total Help needed standing up from a chair using your arms (e.g., wheelchair or bedside chair)?: Total Help needed to walk in hospital room?: Total Help needed climbing 3-5 steps with a railing? : Total 6 Click Score: 6    End of Session   Activity Tolerance: Other (comment) (limited by cognition) Patient left: in bed;with call bell/phone within reach;with family/visitor present;Other (comment);with nursing/sitter in room (with trasnport present) Nurse Communication: Mobility status PT Visit Diagnosis: Other abnormalities of gait and mobility (R26.89);Muscle weakness (generalized) (M62.81);Other symptoms and signs involving the nervous system (R29.898)     Time: 3244-0102 PT Time Calculation (min) (ACUTE ONLY): 18 min  Charges:    $Therapeutic Activity: 8-22 mins PT General Charges $$ ACUTE PT VISIT: 1 Visit                     Peter Kline R. PTA Acute Rehabilitation Services Office:  [phone]   Peter Kline 02/21/2023, 1:54 PM

## 2023-02-21 NOTE — Progress Notes (Addendum)
PROGRESS NOTE    Peter Kline  WJX:[id number] DOB: 11-Nov-1965 DOA: 02/11/2023 PCP: Quentin Angst, MD   Brief Narrative: Peter Kline is a 57 y.o. male with a history of sickle cell c disease with priapism, GERD, chronic pain, retinal hemorrhage, tobacco use, marijuana use, vitamin d deficiency.  Patient presented secondary to altered mental status and required ICU admission for airway protection. Workup significant for acute strokes, acute DVT, aspiration pneumonia with hospitalization complicated by sepsis. Workup negative for CNS infection. Patient improved enough for extubation and transfer to the floor, with persistent fevers.   Assessment and Plan:  Acute metabolic encephalopathy Acute toxic encephalopathy Multifactorial in setting of polysubstance abuse and strokes, in addition to likely aspiration pneumonia. Infectious workup negative for CNS infection. Patient still not back to baseline.  Acute embolic CVA MRI on 10/31 confirmed a subcentimeter acute infarct in the left occipital white matter in addition to MRI on 11/3 significant for scattered small new acute infarcts in the bilateral cerebral and cerebellar hemispheres. CTA head and neck negative for large vessel occlusion or other emergent finding. LDL of 49. Hemoglobin A1C of 4.8%. Transthoracic Echocardiogram significant for normal LVEF of 55-60% with grade 2 diastolic dysfunction and no atrial level shunt. Transesophageal Echocardiogram significant for positive bubble study suggesting PFO. Neurology recommendations for Heparin drip with transition to Eliquis when able. PT/OT recommendations for likely SNF pending cognitive improvement.  -Continue Heparin IV  Aspiration pneumonia Patient has been treated with antibiotics. Tracheal aspirate significant for staphylococcus epidermidis; MRSA PCR negative. Persistent fevers. Vancomycin added. -Continue Vancomycin and Zosyn  Sepsis Present on admission. Secondary to  pneumonia and possible other etiology. CNS workup negative. Blood cultures (11/3) no growth. Patient with persistent fevers and leukocytosis. -CT chest/abdomen/pelvis -Repeat blood cultures -Urine culture  Acute bilateral lower extremity DVT Acute DVT involving the left posterior tibial veins and left peroneal veins. Age indeterminate DVT involving the right posterior tibial veins and right peroneal veins. Patient is on Heparin IV secondary to inconsistent oral intake. -Continue Heparin IV with plan to transition to Eliquis  Dysuria -Check urinalysis and urine culture  Hypernatremia Peak sodium of 150. Secondary to insensible losses and dehydration. Patient is NPO and has an NG tube. Patient started on increased free water. Sodium improved slightly to 148 today.  Acute respiratory failure with hypoxia Secondary to altered mental status. Patient was intubated on 10/30 and managed via mechanical ventilation. Patient extubated on 11/6. Patient down to 2-3 L/min of supplemental oxygen. -Wean to room air as able  Sickle cell C disease Noted. Patient is on folic acid as an outpatient. -Continue folic acid  GERD Noted.  Dysphagia NG tube placed. -Continue NG tube and tube feeds   DVT prophylaxis: Lovenox Code Status:   Code Status: Full Code Family Communication: Wife at bedside Disposition Plan: Discharge likely to SNF pending improvement in fevers and mental status   Consultants:  PCCM Neurology  Procedures:  Intubation/extubation EEG Transthoracic Echocardiogram Transesophageal Echocardiogram Lumbar puncture Small bore feeding tube insertion  Antimicrobials: Vancomycin Zosyn   Subjective: Patient is not able to answer questions appropriately  Objective: BP 121/69 (BP Location: Right Arm)   Pulse (!) 110   Temp (!) 102.6 F (39.2 C) (Oral)   Resp 18   Ht 6' 0.01" (1.829 m)   Wt 68.2 kg   SpO2 98%   BMI 20.39 kg/m   Examination:  General exam: Appears  calm and comfortable Respiratory system: Clear to auscultation. Respiratory effort normal.  Cardiovascular system: S1 & S2 heard, tachycardia, normal rhythm. Gastrointestinal system: Abdomen is nondistended, soft and nontender. Normal bowel sounds heard. Central nervous system: Alert and oriented to self. Psychiatry: Judgement and insight appear impaired.   Data Reviewed: I have personally reviewed following labs and imaging studies  CBC Lab Results  Component Value Date   WBC 22.1 (H) 02/21/2023   RBC 3.56 (L) 02/21/2023   HGB 9.6 (L) 02/21/2023   HCT 27.3 (L) 02/21/2023   MCV 76.7 (L) 02/21/2023   MCH 27.0 02/21/2023   PLT 281 02/21/2023   MCHC 35.2 02/21/2023   RDW 15.4 02/21/2023   LYMPHSABS 1.5 02/11/2023   MONOABS 2.3 (H) 02/11/2023   EOSABS 0.1 02/11/2023   BASOSABS 0.1 02/11/2023     Last metabolic panel Lab Results  Component Value Date   NA 148 (H) 02/21/2023   K 3.5 02/21/2023   CL 115 (H) 02/21/2023   CO2 25 02/21/2023   BUN 19 02/21/2023   CREATININE 1.06 02/21/2023   GLUCOSE 152 (H) 02/21/2023   GFRNONAA >60 02/21/2023   GFRAA 110 11/25/2018   CALCIUM 8.3 (L) 02/21/2023   PHOS 2.0 (L) 02/20/2023   PROT 7.1 02/17/2023   ALBUMIN 2.6 (L) 02/17/2023   LABGLOB 3.0 11/25/2018   AGRATIO 1.5 11/25/2018   BILITOT 1.0 02/17/2023   ALKPHOS 123 02/17/2023   AST 35 02/17/2023   ALT 47 (H) 02/17/2023   ANIONGAP 8 02/21/2023    GFR: Estimated Creatinine Clearance: 73.3 mL/min (by C-G formula based on SCr of 1.06 mg/dL).  Recent Results (from the past 240 hour(s))  Culture, blood (Routine X 2)     Status: None   Collection Time: 02/11/23  9:45 PM   Specimen: BLOOD  Result Value Ref Range Status   Specimen Description BLOOD LEFT ANTECUBITAL  Final   Special Requests   Final    BOTTLES DRAWN AEROBIC AND ANAEROBIC Blood Culture adequate volume   Culture   Final    NO GROWTH 5 DAYS Performed at [facility], [address] [place], [place]  [address]    Report Status 02/16/2023 FINAL  Final  Culture, blood (Routine X 2)     Status: None   Collection Time: 02/11/23  9:50 PM   Specimen: BLOOD RIGHT FOREARM  Result Value Ref Range Status   Specimen Description BLOOD RIGHT FOREARM  Final   Special Requests   Final    BOTTLES DRAWN AEROBIC AND ANAEROBIC Blood Culture adequate volume   Culture   Final    NO GROWTH 5 DAYS Performed at [facility], [address], [place], [place] [address]    Report Status 02/16/2023 FINAL  Final  Resp panel by RT-PCR (RSV, Flu A&B, Covid) Anterior Nasal Swab     Status: None   Collection Time: 02/11/23 11:23 PM   Specimen: Anterior Nasal Swab  Result Value Ref Range Status   SARS Coronavirus 2 by RT PCR NEGATIVE NEGATIVE Final   Influenza A by PCR NEGATIVE NEGATIVE Final   Influenza B by PCR NEGATIVE NEGATIVE Final    Comment: (NOTE) The Xpert Xpress SARS-CoV-2/FLU/RSV plus assay is intended as an aid in the diagnosis of influenza from Nasopharyngeal swab specimens and should not be used as a sole basis for treatment. Nasal washings and aspirates are unacceptable for Xpert Xpress SARS-CoV-2/FLU/RSV testing.  Fact Sheet for Patients: BloggerCourse.com  Fact Sheet for Healthcare Providers: SeriousBroker.it  This test is not yet approved or cleared by the [place]  [place] FDA and has been authorized for detection and/or diagnosis of SARS-CoV-2 by FDA under an Emergency Use Authorization (EUA). This EUA will remain in effect (meaning this test can be used) for the duration of the COVID-19 declaration under Section 564(b)(1) of the Act, 21 U.S.C. section 360bbb-3(b)(1), unless the authorization is terminated or revoked.     Resp Syncytial Virus by PCR NEGATIVE NEGATIVE Final    Comment: (NOTE) Fact Sheet for Patients: BloggerCourse.com  Fact Sheet for Healthcare  Providers: SeriousBroker.it  This test is not yet approved or cleared by the [place] FDA and has been authorized for detection and/or diagnosis of SARS-CoV-2 by FDA under an Emergency Use Authorization (EUA). This EUA will remain in effect (meaning this test can be used) for the duration of the COVID-19 declaration under Section 564(b)(1) of the Act, 21 U.S.C. section 360bbb-3(b)(1), unless the authorization is terminated or revoked.  Performed at [facility], [address], [place], [place] [address]   MRSA Next Gen by PCR, Nasal     Status: None   Collection Time: 02/12/23  8:35 AM  Result Value Ref Range Status   MRSA by PCR Next Gen NOT DETECTED NOT DETECTED Final    Comment: (NOTE) The GeneXpert MRSA Assay (FDA approved for NASAL specimens only), is one component of a comprehensive MRSA colonization surveillance program. It is not intended to diagnose MRSA infection nor to guide or monitor treatment for MRSA infections. Test performance is not FDA approved in patients less than 107 years old. Performed at [facility], [address] [place], [place] [address]   Culture, Respiratory w Gram Stain     Status: None   Collection Time: 02/13/23  8:11 AM   Specimen: Tracheal Aspirate; Respiratory  Result Value Ref Range Status   Specimen Description TRACHEAL ASPIRATE  Final   Special Requests NONE  Final   Gram Stain   Final    MODERATE WBC PRESENT, PREDOMINANTLY PMN NO ORGANISMS SEEN    Culture   Final    NO GROWTH 3 DAYS Performed at [facility], [address], [place], [place] [address]    Report Status 02/16/2023 FINAL  Final  Respiratory (~20 pathogens) panel by PCR     Status: None   Collection Time: 02/13/23  8:31 AM   Specimen: Nasopharyngeal Swab; Respiratory  Result Value Ref Range Status   Adenovirus NOT DETECTED NOT DETECTED Final   Coronavirus 229E NOT DETECTED NOT DETECTED Final    Comment: (NOTE) The  Coronavirus on the Respiratory Panel, DOES NOT test for the novel  Coronavirus (2019 nCoV)    Coronavirus HKU1 NOT DETECTED NOT DETECTED Final   Coronavirus NL63 NOT DETECTED NOT DETECTED Final   Coronavirus OC43 NOT DETECTED NOT DETECTED Final   Metapneumovirus NOT DETECTED NOT DETECTED Final   Rhinovirus / Enterovirus NOT DETECTED NOT DETECTED Final   Influenza A NOT DETECTED NOT DETECTED Final   Influenza B NOT DETECTED NOT DETECTED Final   Parainfluenza Virus 1 NOT DETECTED NOT DETECTED Final   Parainfluenza Virus 2 NOT DETECTED NOT DETECTED Final   Parainfluenza Virus 3 NOT DETECTED NOT DETECTED Final   Parainfluenza Virus 4 NOT DETECTED NOT DETECTED Final   Respiratory Syncytial Virus NOT DETECTED NOT DETECTED Final   Bordetella pertussis NOT DETECTED NOT DETECTED Final   Bordetella Parapertussis NOT DETECTED NOT DETECTED Final   Chlamydophila pneumoniae NOT DETECTED NOT DETECTED Final   Mycoplasma pneumoniae NOT DETECTED NOT DETECTED Final  Comment: Performed at [facility], [address] [place], [place] [address]  Culture, blood (Routine X 2) w Reflex to ID Panel     Status: None (Preliminary result)   Collection Time: 02/16/23  8:03 PM   Specimen: BLOOD LEFT HAND  Result Value Ref Range Status   Specimen Description BLOOD LEFT HAND  Final   Special Requests   Final    BOTTLES DRAWN AEROBIC AND ANAEROBIC Blood Culture results may not be optimal due to an excessive volume of blood received in culture bottles   Culture   Final    NO GROWTH 4 DAYS Performed at [facility], [address], [place], [place] [address]    Report Status PENDING  Incomplete  Culture, blood (Routine X 2) w Reflex to ID Panel     Status: None (Preliminary result)   Collection Time: 02/16/23  8:09 PM   Specimen: BLOOD LEFT ARM  Result Value Ref Range Status   Specimen Description BLOOD LEFT ARM  Final   Special Requests   Final    BOTTLES DRAWN AEROBIC AND ANAEROBIC Blood Culture  results may not be optimal due to an excessive volume of blood received in culture bottles   Culture   Final    NO GROWTH 4 DAYS Performed at [facility], [address], [place], [place] [address]    Report Status PENDING  Incomplete  CSF culture w Gram Stain     Status: None (Preliminary result)   Collection Time: 02/17/23  2:35 PM   Specimen: PATH Cytology CSF; Cerebrospinal Fluid  Result Value Ref Range Status   Specimen Description CSF  Final   Special Requests NONE  Final   Gram Stain NO WBC SEEN NO ORGANISMS SEEN CYTOSPIN SMEAR   Final   Culture   Final    NO GROWTH 3 DAYS Performed at [facility], [address], [place], [place] [address]    Report Status PENDING  Incomplete  Surgical PCR screen     Status: Abnormal   Collection Time: 02/17/23  7:47 PM   Specimen: Nasal Mucosa; Nasal Swab  Result Value Ref Range Status   MRSA, PCR (A) NEGATIVE Final    INVALID, UNABLE TO DETERMINE THE PRESENCE OF TARGET DUE TO SPECIMEN INTEGRITY. RECOLLECTION REQUESTED.    Comment: RESULT CALLED TO, READ BACK BY AND VERIFIED WITH: RN C. RUECKEL [id number] @2201  FH    Staphylococcus aureus (A) NEGATIVE Final    INVALID, UNABLE TO DETERMINE THE PRESENCE OF TARGET DUE TO SPECIMEN INTEGRITY. RECOLLECTION REQUESTED.    Comment: Performed at [facility], [address] [place], [place] [address]  Surgical PCR screen     Status: None   Collection Time: 02/17/23 10:10 PM   Specimen: Nasal Mucosa; Nasal Swab  Result Value Ref Range Status   MRSA, PCR NEGATIVE NEGATIVE Final   Staphylococcus aureus NEGATIVE NEGATIVE Final    Comment: (NOTE) The Xpert SA Assay (FDA approved for NASAL specimens in patients 80 years of age and older), is one component of a comprehensive surveillance program. It is not intended to diagnose infection nor to guide or monitor treatment. Performed at [facility], [address], [place], [place] [address]   Culture, Respiratory w Gram  Stain     Status: None   Collection Time: 02/18/23  7:09 AM   Specimen: Tracheal Aspirate; Respiratory  Result Value Ref Range Status   Specimen Description TRACHEAL ASPIRATE  Final   Special Requests NONE  Final   Gram Stain   Final    RARE WBC PRESENT,BOTH PMN AND MONONUCLEAR NO ORGANISMS SEEN Performed at [facility], [address] [place], [place] [address]    Culture MODERATE STAPHYLOCOCCUS EPIDERMIDIS  Final   Report Status 02/20/2023 FINAL  Final   Organism ID, Bacteria STAPHYLOCOCCUS EPIDERMIDIS  Final      Susceptibility   Staphylococcus epidermidis - MIC*    CIPROFLOXACIN >=8 RESISTANT Resistant     ERYTHROMYCIN >=8 RESISTANT Resistant     GENTAMICIN <=0.5 SENSITIVE Sensitive     OXACILLIN >=4 RESISTANT Resistant     TETRACYCLINE <=1 SENSITIVE Sensitive     VANCOMYCIN 2 SENSITIVE Sensitive     TRIMETH/SULFA <=10 SENSITIVE Sensitive     CLINDAMYCIN <=0.25 SENSITIVE Sensitive     RIFAMPIN <=0.5 SENSITIVE Sensitive     Inducible Clindamycin NEGATIVE Sensitive     * MODERATE STAPHYLOCOCCUS EPIDERMIDIS      Radiology Studies: DG Abd Portable 1V  Result Date: 02/19/2023 CLINICAL DATA:  Feeding tube placement. EXAM: PORTABLE ABDOMEN - 1 VIEW COMPARISON:  Same day. FINDINGS: Distal tip of feeding tube is seen in expected position of distal stomach. IMPRESSION: Distal tip of feeding tube is seen in expected position of distal stomach. Electronically Signed   By: Lupita Raider M.D.   On: 02/19/2023 12:24   DG CHEST PORT 1 VIEW  Result Date: 02/19/2023 CLINICAL DATA:  Pneumonia.  Intubated. EXAM: PORTABLE CHEST 1 VIEW COMPARISON:  Chest x-ray dated February 15, 2023. FINDINGS: Endotracheal tube tip 5 cm above the carina. Enteric tube within the stomach. The heart size and mediastinal contours are within normal limits. Basilar and peripheral predominant patchy airspace opacities in both lungs have improved over the past 4 days. No pleural effusion or pneumothorax. No  acute osseous abnormality. IMPRESSION: 1. Improving multifocal pneumonia. Electronically Signed   By: Obie Dredge M.D.   On: 02/19/2023 10:32   DG Abd Portable 1V  Result Date: 02/19/2023 CLINICAL DATA:  Enteric tube placement. EXAM: PORTABLE ABDOMEN - 1 VIEW COMPARISON:  Abdominal x-ray from yesterday. FINDINGS: Enteric tube remains appropriately positioned in the stomach. Visualized bowel gas pattern is normal. Patchy airspace disease in both lung bases, not significantly changed. IMPRESSION: 1. Enteric tube appropriately positioned in the stomach. Electronically Signed   By: Obie Dredge M.D.   On: 02/19/2023 10:31   EEG adult  Result Date: 02/19/2023 Charlsie Quest, MD     02/19/2023  8:13 AM Patient Name: TRUSTYN EWERT MRN: [id number] Epilepsy Attending: Charlsie Quest Referring Physician/Provider: Lorin Glass, MD Date: 02/18/2023 Duration: 24.35 mins Patient history:  57yo M with ams getting eeg to evaluate for seizure  Level of alertness:  comatose  AEDs during EEG study: Propofol, Phenobarb  Technical aspects: This EEG study was done with scalp electrodes positioned according to the 10-20 International system of electrode placement. Electrical activity was reviewed with band pass filter of 1-70Hz , sensitivity of 7 uV/mm, display speed of 37mm/sec with a 60Hz  notched filter applied as appropriate. EEG data were recorded continuously and digitally stored.  Video monitoring was available and reviewed as appropriate.  Description: EEG showed continuous generalized predominantly 5 to 6 Hz theta slowing admixed with intermittent 2-3hz  delta slowing. Hyperventilation and photic stimulation were not performed.    ABNORMALITY - Continuous slow, generalized  IMPRESSION: This study is suggestive of moderate to severe diffuse encephalopathy. No seizures or epileptiform discharges were  seen throughout the recording.  Charlsie Quest     LOS: 9 days    Jacquelin Hawking, MD [facility] 02/21/2023, 7:48 AM   If 7PM-7AM, please contact night-coverage [email or web address]

## 2023-02-21 NOTE — Progress Notes (Addendum)
PHARMACY - ANTICOAGULATION CONSULT NOTE  Pharmacy Consult:  Enoxaparin -> apixaban Indication: DVT  No Known Allergies  Patient Measurements: Height: 6' 0.01" (182.9 cm) Weight: 68.2 kg (150 lb 5.7 oz) IBW/kg (Calculated) : 77.62 Heparin Dosing Weight: 70 kg  Vital Signs: Temp: 100.2 F (37.9 C) (11/08 1521) Temp Source: Oral (11/08 1521) BP: 130/80 (11/08 1521) Pulse Rate: 97 (11/08 1521)  Labs: Recent Labs    02/19/23 0537 02/19/23 0545 02/19/23 0545 02/19/23 1613 02/20/23 0301 02/21/23 0523  HGB  --  9.8*   < >  --  10.4* 9.7*  9.6*  HCT  --  27.6*  --   --  29.5* 27.9*  27.3*  PLT  --  208  --   --  267 287  281  HEPARINUNFRC  --   --   --  <0.10* <0.10*  --   CREATININE  --  0.78  --   --  0.91 1.06  CKTOTAL 51  --   --   --   --   --    < > = values in this interval not displayed.    Estimated Creatinine Clearance: 73.3 mL/min (by C-G formula based on SCr of 1.06 mg/dL).   Medical History: Past Medical History:  Diagnosis Date   Shortness of breath    Sickle cell anemia (HCC)     Assessment: 21 YOM with multifactorial encephalopathy.  Pharmacy consulted to dose IV heparin per stroke protocol for acute L DVT and age indeterminate R DVT.  Patient has multiple embolic CVAs.  No anticoagulation prior to admission.   Heparin levels have been subtherapeutic for 24 hours. Difficulty getting levels on time due to staffing. Discussed with MD who was ok with switching to enoxaparin.  Team planning to swtich to apixaban when can take PO meds.  11/8 pm - okay to switch to apixaban. Discussed with neuro PA and okay with apixaban 5mg  per tube BID (no 7-day loading dose with recent CVA). Last dose of Lovenox this morning at 6am.  Goal of Therapy:  Treatment of DVT Monitor platelets by anticoagulation protocol: Yes   Plan:   Apixaban 5mg  per tube BID Will need education when able and price check  Christoper Fabian, PharmD, BCPS Please see amion for complete clinical  pharmacist phone list 02/21/2023 4:32 PM

## 2023-02-21 NOTE — TOC Progression Note (Addendum)
Transition of Care [facility]) - Progression Note    Patient Details  Name: Peter Kline MRN: [id number] Date of Birth: Apr 18, 1965  Transition of Care [facility]) CM/SW Contact  Kermit Balo, RN Phone Number: 02/21/2023, 2:26 PM  Clinical Narrative:     Pt still with cortak. SNF vs home when medically ready.  TOC following.  Expected Discharge Plan: Skilled Nursing Facility    Expected Discharge Plan and Services                                               Social Determinants of Health (SDOH) Interventions SDOH Screenings   Food Insecurity: No Food Insecurity (11/15/2021)   Received from [facility]  Transportation Needs: No Transportation Needs (11/15/2021)   Received from [facility]  Depression [phone]): Low Risk  (11/25/2018)  Financial Resource Strain: Low Risk  (11/15/2021)   Received from [facility]  Tobacco Use: High Risk (02/11/2023)    Readmission Risk Interventions     No data to display

## 2023-02-21 NOTE — Hospital Course (Signed)
Peter Kline is a 57 y.o. male with a history of sickle cell c disease with priapism, GERD, chronic pain, retinal hemorrhage, tobacco use, marijuana use, vitamin d deficiency.  Patient presented secondary to altered mental status and required ICU admission for airway protection. Workup significant for acute strokes, acute DVT, aspiration pneumonia with hospitalization complicated by sepsis. Workup negative for CNS infection. Patient improved enough for extubation and transfer to the floor, with persistent fevers.

## 2023-02-21 NOTE — Progress Notes (Addendum)
STROKE TEAM PROGRESS NOTE   BRIEF HPI [name] is a  57 y.o. male  has a past medical history of Shortness of breath and Sickle cell anemia (HCC). who presented was found wandering the streets confused and got intubated for persistent agitation. Etiology is unclear. MRI brain, cEEG unrevealing, failed LP attempts, being treated for aspiration pneumonia. Difficulty weaning off sedation due to agitaition and vent dyssnychrony.   SIGNIFICANT Peter Kline EVENTS 11/3 Repeat MRI showed scattered new small acute infarcts bilateral cerebral and cerebellar. CTA negative.  11/4: LP completed by CCM. Results negative.   INTERIM HISTORY/SUBJECTIVE RN at the bedside. Family at the bedside. Peter Kline states improved PO intake.   On exam, he answered a couple of questions (said Peter Kline and said his Peter Kline's name), not following commands, generalized weakness all over.   Will start Eliquis.    OBJECTIVE  CBC    Component Value Date/Time   WBC 22.1 (H) 02/21/2023 0523   WBC 21.2 (H) 02/21/2023 0523   RBC 3.56 (L) 02/21/2023 0523   RBC 3.62 (L) 02/21/2023 0523   HGB 9.6 (L) 02/21/2023 0523   HGB 9.7 (L) 02/21/2023 0523   HGB 13.6 11/25/2018 1534   HCT 27.3 (L) 02/21/2023 0523   HCT 27.9 (L) 02/21/2023 0523   HCT 41.1 11/25/2018 1534   PLT 281 02/21/2023 0523   PLT 287 02/21/2023 0523   PLT 198 11/25/2018 1534   MCV 76.7 (L) 02/21/2023 0523   MCV 77.1 (L) 02/21/2023 0523   MCV 87 11/25/2018 1534   MCH 27.0 02/21/2023 0523   MCH 26.8 02/21/2023 0523   MCHC 35.2 02/21/2023 0523   MCHC 34.8 02/21/2023 0523   RDW 15.4 02/21/2023 0523   RDW 15.7 (H) 02/21/2023 0523   RDW 15.6 (H) 11/25/2018 1534   LYMPHSABS PENDING 02/21/2023 0523   LYMPHSABS 2.7 09/03/2017 1402   MONOABS PENDING 02/21/2023 0523   EOSABS PENDING 02/21/2023 0523   EOSABS 0.4 09/03/2017 1402   BASOSABS PENDING 02/21/2023 0523   BASOSABS 0.1 09/03/2017 1402    BMET    Component Value Date/Time   NA 148 (H)  02/21/2023 0523   NA 137 11/25/2018 1534   K 3.5 02/21/2023 0523   CL 115 (H) 02/21/2023 0523   CO2 25 02/21/2023 0523   GLUCOSE 152 (H) 02/21/2023 0523   BUN 19 02/21/2023 0523   BUN 7 11/25/2018 1534   CREATININE 1.06 02/21/2023 0523   CREATININE 0.92 05/02/2014 1430   CALCIUM 8.3 (L) 02/21/2023 0523   GFRNONAA >60 02/21/2023 0523   GFRNONAA >89 05/02/2014 1430    IMAGING past 24 hours No results found.  Vitals:   02/21/23 0215 02/21/23 0220 02/21/23 0349 02/21/23 0735  BP:  120/68 122/84 121/69  Pulse:  (!) 105 (!) 108 (!) 110  Resp:  18 17 18   Temp: 99.3 F (37.4 C) 99.3 F (37.4 C) 99.2 F (37.3 C) (!) 102.6 F (39.2 C)  TempSrc:  Oral Oral Oral  SpO2:  98% 97% 98%  Weight:      Height:         General: Critically ill appearing African American male laying in ICU bed, in no acute distress t HENT: atraumatic  Head: Normocephalic Cardiovascular: Normal rate and regular rhythm.  Respiratory: regular and even unlabored  GI: Soft.  No distension. There is no tenderness.  Skin: WDI.    NEURO Mental status/Cognition: Spontaneous,Eyes open, only answering a couple of questions then nonsensical, not following commands .  Speech/language:limited, no dysarthria  Cranial nerves:   CN II Ovoid right pupil > round left pupil, both sluggishly reactive to light   CN III,IV,VI Right gaze preference, dolls eyes reflex is intact, does not fixate or track.    CN V Corneals intact BL to eyelash brush, blinks to threat bilaterally    CN VII Facial symmetry   CN VIII Unable to assess due to patient's condition   CN IX & X Cough intact, gag not assessed   CN XI Nare stimulation not completed today   CN XII Does not protrude tongue to command    Sensory/Motor:  Localization in bilateral uppers and withdrawal to bilateral lowers.  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  Neurological exam likely complicated by encephalopathy from sepsis and pneumonia 10/29 CODE STROKE CT head No acute abnormality. ASPECTS 10.     10/31 MRI   No evidence of intracranial infection.  Left Occipital white matter acute infarct Small chronic infratentorial infarcts including left pons Left frontal lobe encephalomalacia 11/3 MRI Scattered new small acute infarcts bilateral cerebral and cerebellar hemispheres.   LTM EEG 11/2 1008 to 11/2 1340: suggestive of moderate to severe diffuse encephalopathy. No seizures seen.  2D Echo: EF 55-60%, Grade II diastolic dysfunction, Moderately dilated left ateria, Mild to Moderate MVR TEE: EF 55-60%. Bubble study was positive, suggesting a small PFO  [place] LE acute DVT in left posterior tibial and peroneal veins. Starting Eliquis.   LDL 49 HgbA1c 4.8 VTE prophylaxis -heparin IV  No antithrombotic prior to admission, now transitioning to Eliquis.  Therapy recommendations:  SNF Disposition:  pending  Acute Encephalopathy Secondary to alcohol abuse, hyperammonemia CIWA protocol in place Lactulose ordered  Acute Respiratory Failure ?Aspiration Pneumonia Extubated 11/6 Completed Unasyn and ceftriaxone  On Zosyn  Van added 11/7 with worsening leukocytosis and febrile  Hx of Hypertension Home meds:  none Stable, on lower end BP goal normotensive, as out of permissive hypertension window due to unclear LKW  Hyperlipidemia Home meds:  none LDL 49, goal < 70 High intensity statin not indicated due to LDL within goal   Diabetes type II, no history Home meds:  none HgbA1c 4.8, goal < 7.0 CBGs SSI  Tobacco Abuse Current smoker      Ready to quit? N/A Nicotine patch ordered  On thiamine and folate   Substance Abuse Patient uses marijuana UDS positive for  THC      Ready to quit? N/A  Dysphagia Patient has post-stroke dysphagia, SLP consulted     Diet   DIET DYS 2 Room service appropriate? Yes; Fluid consistency: Thin   Advance diet as tolerated On Tfs, change to nocturnal to encourage PO intake during the day Passed for diet, poor po intake, continue to monitor.   Other Stroke Risk Factors ETOH abuse, alcohol level <10, advised to drink no more than 1-2 drink(s) a day CIWA protocol Hx of Sickle Cell disease  Other Active Problems, per primary team GERD Fever, tMax 102.6 WBC 21.6 -> 17.6 -> 21.0 -> 14.6 -> 17.4 -> 15.3 -> 19.0->21.5->22.6->21.2 Currently on zosyn and Vanc Blood culture sent 11/8 Anemia, Hbg 9.7 -> 10.4->9.8->10.4->9.7, monitor.  Hypokalemia K 3.3- replaced ->3.5 Hypernatremia, Na 138->141->145->147->147->150->148 Free Water flushes Q4H-->Q6H  Peter Kline day # 9   Pt seen by Neuro NP/APP and later by MD. Note/plan to  be edited by MD as needed.    Peter January, DNP, AGACNP-BC [facility] Please use AMION for contact information & EPIC for messaging.  I have personally obtained history,examined this patient, reviewed notes, independently viewed imaging studies, participated in medical decision making and plan of care.ROS completed by me personally and pertinent positives fully documented  I have made any additions or clarifications directly to the above note. Agree with note above.  Patient is improving and is able to swallow.  Recommend switch from IV heparin to Eliquis.  Continue ongoing therapy and transfer to rehab in the next few days.  Stroke team will sign off.  Kindly call for questions.  Peter Heady, MD Medical Director [facility] Pager: [phone] 02/21/2023 5:17 PM    To contact Stroke Continuity provider, please refer to [email or web address]. After hours, contact General Neurology

## 2023-02-21 NOTE — Progress Notes (Signed)
Speech Language Pathology Treatment: Dysphagia  Patient Details Name: Peter Kline MRN: [id number] DOB: Apr 30, 1965 Today's Date: 02/21/2023 Time: 1100-1111 SLP Time Calculation (min) (ACUTE ONLY): 11 min  Assessment / Plan / Recommendation Clinical Impression  Pt seen for ongoing dysphagia management.  Pt has had change in mental status.  His significant other was present and reports she fed him his whole meal tray.  He also has NGT with tube feeds and is at goal.  Pt with wet coughing on SLP arrival in absence of PO trials.  Pt  exhibited cough x1 with thin liquid by straw.  Pt appeared to swallow small amount of puree, but may have held this orally. Pt has been refusing care this morning and intermittently refused bolus trials.  Majority of bolus trials that were administered were forcefully expectorated.  Pt expectorating secretions as well.  SO reports he had been doing this in ICU as well, raising concerns for difficulty with secretion management. Pt with 8 day intubation and worsening AMS.  It is difficult to tell if what was observed today is 2/2 cognition or represents an oropharyngeal swallowing impairment.  Pt has had fevers today, and increased HR and RR.  RN reports rapid response checked on pt earlier. Most recent chest imaging from 11/6 with resolving multifocal pna (which was present on admission).  RN listened to lungs on request.  Reports clear lungs but some upper respiratory crackling. Unfortunately, pt is unable to participate in further swallowing assessment at this time.   Recommend pt be made NPO and continue alternative means of nutrition, hydration, and medication.  Pt may have ice chips in moderation, after good oral care, when fully awake/alert, with upright positioning and direct supervision.     HPI HPI: Peter Kline is a 57 year old male with presented to the emergency department on 10/30 with altered mental status. Was reportedly found wandering streets of [place]  altered.  Pt with respiratory failure and intubated 10/30-11/6.  MRI 10/31 negative.  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 Pt with past medical history of sickle cell c/b priapism, GERD, chronic pain, retinal hemorrhage, tobacco use, marijuana use, vitamin D deficiency      SLP Plan  Continue with current plan of care      Recommendations for follow up therapy are one component of a multi-disciplinary discharge planning process, led by the attending physician.  Recommendations may be updated based on patient status, additional functional criteria and insurance authorization.    Recommendations  Diet recommendations: NPO Liquids provided via: Straw Medication Administration: Via alternative means                  Oral care QID     Dysphagia, unspecified (R13.10)     Continue with current plan of care     Kerrie Pleasure, MA, CCC-SLP Acute Rehabilitation Services Office: [phone] 02/21/2023, 11:39 AM

## 2023-02-21 NOTE — Progress Notes (Signed)
Occupational Therapy Treatment Patient Details Name: Peter Kline MRN: [id number] DOB: [birth year] Today's Date: 02/21/2023   History of present illness Pt is a 57 y/o male presenting to the ED on 10/30 with AMS, found wandering the streets of [facility] confused with slurred speech.  Initially negative for stroke.  Mental status failed to improve, pt became more agitated and eventually was intubated for airway protection.  Chest xray showed multifocal PNA.  11/1 had small aspiration event that led to further agitation and further aspiration, 11/3 repeat MRI showed scattered new infarct in bil cerebrum and cerebellum, 11/5  bil LE DVT+  PMHx:  sickle cell anemia, lap repair of gastric ulcer.   OT comments  This 57 yo male seen today with PT to see if we could progress bed mobility, sitting balance, and perhaps standing balance. We started to get the bed ready to get him up and noticed lines were soiled so we worked with him on rolling to get cleaned up and as soon as we finished this, transport was here to get him for a test. Pt was not following commands, kept saying oh-oh-oh but could not tell us where exactly he was hurting (did say all over) and was total A +2 to roll in bed. He will continue to benefit from acute OT with follow up from continued inpatient follow up therapy, <3 hours/day.       If plan is discharge home, recommend the following:  Two people to help with walking and/or transfers;Two people to help with bathing/dressing/bathroom;Assistance with feeding;Assist for transportation;Direct supervision/assist for financial management;Direct supervision/assist for medications management;Help with stairs or ramp for entrance;Assistance with cooking/housework   Equipment Recommendations  Other (comment) (TBD next venue)       Precautions / Restrictions Precautions Precautions: Fall Precaution Comments: flexi-seal, condom cath, spits (put a mask on him) Restrictions Weight Bearing  Restrictions: No       Mobility Bed Mobility Overal bed mobility: Needs Assistance Bed Mobility: Rolling Rolling: +2 for physical assistance, Total assist         General bed mobility comments: total A +2 for all aspects to roll for peri-care with pt verbalizing in pain and increased cough in sidelying           ADL either performed or assessed with clinical judgement   ADL                                         General ADL Comments: total A (not following commands)    Extremity/Trunk Assessment Upper Extremity Assessment Upper Extremity Assessment: Generalized weakness (moves both arms spontaneously but not on command)            Vision   Additional Comments: to be further assessed; pt currently not following commands          Cognition Arousal: Lethargic, Alert (waxing and waning) Behavior During Therapy: Restless Overall Cognitive Status: Impaired/Different from baseline Area of Impairment: Orientation, Attention, Following commands, Safety/judgement, Awareness, Problem solving                 Orientation Level: Person Current Attention Level: Focused   Following Commands: Follows one step commands inconsistently Safety/Judgement: Decreased awareness of safety, Decreased awareness of deficits Awareness: Intellectual Problem Solving: Slow processing, Decreased initiation General Comments: pt unable to follow commands for purposful mobility, moving UEs throughout  General Comments VSS, pt spouse present at bedside    Pertinent Vitals/ Pain       Pain Assessment Pain Assessment: Faces Faces Pain Scale: Hurts little more Pain Location: general aches/pain  ("all over") Pain Descriptors / Indicators: Grimacing, Guarding, Moaning Pain Intervention(s): Limited activity within patient's tolerance, Monitored during session, Repositioned         Frequency  Min 1X/week        Progress Toward Goals     Progress towards OT goals:  (limited session today due to transport coming to get him for a test)         Co-evaluation    PT/OT/SLP Co-Evaluation/Treatment: Yes Reason for Co-Treatment: For patient/therapist safety;Necessary to address cognition/behavior during functional activity PT goals addressed during session: Mobility/safety with mobility OT goals addressed during session: Strengthening/ROM      AM-PAC OT "6 Clicks" Daily Activity     Outcome Measure   Help from another person eating meals?: Total Help from another person taking care of personal grooming?: Total Help from another person toileting, which includes using toliet, bedpan, or urinal?: Total Help from another person bathing (including washing, rinsing, drying)?: Total Help from another person to put on and taking off regular upper body clothing?: Total Help from another person to put on and taking off regular lower body clothing?: Total 6 Click Score: 6    End of Session    OT Visit Diagnosis: Other abnormalities of gait and mobility (R26.89);Other symptoms and signs involving the nervous system (R29.898);Other symptoms and signs involving cognitive function;Pain Pain - part of body:  ("generalized")   Activity Tolerance  (limited by transport coming to get him for a test)   Patient Left in bed (getting ready to leave room with transport)           Time: 9562-1308 OT Time Calculation (min): 20 min  Charges: OT General Charges $OT Visit: 1 Visit Lindon Romp OT Acute Rehabilitation Services Office [phone]    Evette Georges 02/21/2023, 2:22 PM

## 2023-02-22 ENCOUNTER — Inpatient Hospital Stay (HOSPITAL_COMMUNITY): Payer: 59

## 2023-02-22 DIAGNOSIS — J189 Pneumonia, unspecified organism: Secondary | ICD-10-CM | POA: Diagnosis not present

## 2023-02-22 DIAGNOSIS — G9340 Encephalopathy, unspecified: Secondary | ICD-10-CM | POA: Diagnosis not present

## 2023-02-22 DIAGNOSIS — J9601 Acute respiratory failure with hypoxia: Secondary | ICD-10-CM | POA: Diagnosis not present

## 2023-02-22 DIAGNOSIS — R4182 Altered mental status, unspecified: Secondary | ICD-10-CM | POA: Diagnosis not present

## 2023-02-22 LAB — CBC
HCT: 30.3 % — ABNORMAL LOW (ref 39.0–52.0)
Hemoglobin: 10.5 g/dL — ABNORMAL LOW (ref 13.0–17.0)
MCH: 26.9 pg (ref 26.0–34.0)
MCHC: 34.7 g/dL (ref 30.0–36.0)
MCV: 77.5 fL — ABNORMAL LOW (ref 80.0–100.0)
Platelets: 293 10*3/uL (ref 150–400)
RBC: 3.91 MIL/uL — ABNORMAL LOW (ref 4.22–5.81)
RDW: 15.9 % — ABNORMAL HIGH (ref 11.5–15.5)
WBC: 26.2 10*3/uL — ABNORMAL HIGH (ref 4.0–10.5)
nRBC: 0.3 % — ABNORMAL HIGH (ref 0.0–0.2)

## 2023-02-22 LAB — BASIC METABOLIC PANEL
Anion gap: 10 (ref 5–15)
BUN: 20 mg/dL (ref 6–20)
CO2: 25 mmol/L (ref 22–32)
Calcium: 8.7 mg/dL — ABNORMAL LOW (ref 8.9–10.3)
Chloride: 114 mmol/L — ABNORMAL HIGH (ref 98–111)
Creatinine, Ser: 1.1 mg/dL (ref 0.61–1.24)
GFR, Estimated: >60 mL/min (ref >60)
Glucose, Bld: 130 mg/dL — ABNORMAL HIGH (ref 70–99)
Potassium: 3.8 mmol/L (ref 3.5–5.1)
Sodium: 149 mmol/L — ABNORMAL HIGH (ref 135–145)

## 2023-02-22 LAB — GLUCOSE, CAPILLARY
Glucose-Capillary: 113 mg/dL — ABNORMAL HIGH (ref 70–99)
Glucose-Capillary: 122 mg/dL — ABNORMAL HIGH (ref 70–99)
Glucose-Capillary: 124 mg/dL — ABNORMAL HIGH (ref 70–99)
Glucose-Capillary: 132 mg/dL — ABNORMAL HIGH (ref 70–99)
Glucose-Capillary: 140 mg/dL — ABNORMAL HIGH (ref 70–99)
Glucose-Capillary: 147 mg/dL — ABNORMAL HIGH (ref 70–99)
Glucose-Capillary: 152 mg/dL — ABNORMAL HIGH (ref 70–99)
Glucose-Capillary: 170 mg/dL — ABNORMAL HIGH (ref 70–99)

## 2023-02-22 LAB — URINE CULTURE: Culture: NO GROWTH

## 2023-02-22 LAB — TRIGLYCERIDES: Triglycerides: 102 mg/dL (ref <150)

## 2023-02-22 LAB — MAGNESIUM: Magnesium: 2.8 mg/dL — ABNORMAL HIGH (ref 1.7–2.4)

## 2023-02-22 MED ORDER — DEXTROSE 5 % IV SOLN: INTRAVENOUS | Status: AC

## 2023-02-22 MED ORDER — OXYCODONE HCL 5 MG PO TABS
5.0000 mg | ORAL_TABLET | ORAL | Status: DC | PRN
  Administered 2023-02-23 – 2023-02-24 (×2): 5 mg
  Filled 2023-02-22 (×2): qty 1

## 2023-02-22 MED ORDER — LORAZEPAM 2 MG/ML PO CONC
2.0000 mg | Freq: Two times a day (BID) | ORAL | Status: DC | PRN
  Administered 2023-02-25: 2 mg
  Filled 2023-02-22: qty 1

## 2023-02-22 NOTE — Progress Notes (Signed)
Glucometer was not transfer information to epic. CBG 152.

## 2023-02-22 NOTE — Procedures (Signed)
Modified Barium Swallow Study  Patient Details  Name: Peter Kline MRN: [id number] Date of Birth: [birth year]  Today's Date: 02/22/2023  Modified Barium Swallow completed.  Full report located under Chart Review in the Imaging Section.  History of Present Illness Peter Kline is a 57 year old male with presented to the emergency department on 10/30 with altered mental status. Was reportedly found wandering streets of [place] altered.  Pt with respiratory failure and intubated 10/30-11/6.  MRI 10/31 negative.  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 Pt with past medical history of sickle cell c/b priapism, GERD, chronic pain, retinal hemorrhage, tobacco use, marijuana use, vitamin D deficiency   Clinical Impression  Pt presents with a mild-moderate oral and a mild pharyngeal dysphagia per results of MBSS completed today. Of note, pt received multiple sedating medication prior to Peter Kline and he was much more lethargic compared to earlier bedside assessment today. MBSS trials were limited to thin liquids and pudding due to his lethargy.  Anticipate the majority of his swallowing deficits observed today are related to his current lethargic state.   Oral deficits characterized by poor oral acceptance and initiation (likely in the setting of lethargy), reduce labial resulting in moderate anterior spillage of thin liquids by spoon and cup. Pt unable to initiate use of straw. There was prolonged oral prep and transit of pudding trial with verbal cues needed to improve oral transit time. There was reduce oral coordination with liquids resulting in posterior spillage of thin liquids to the level of the pyriform sinuses before the swallow.   Pharyngeal deficits characterized by briefly delayed pharyngeal swallow initiation,  reduced base of tongue retraction, reduced laryngeal vestibule closure, and reduced pharyngeal stripping.   Findings:  -No aspiration observed across PO trials on MBSS.  -There was x1 instance of trace, transient penetration of thin liquids by cup before/during the swallow. Penetrated liquid residue was ejected during the swallow.  -There was a min-mod amount of vallecular residue following pudding trials, which was reduced with a subsequent liquid wash.   Recommend initiation of a full liquids, thin liquid diet with strict adherence to aspiration precautions. Recommend only offering PO options if pt is fully awake/alert. Anticipate pt's diet will be able to be advanced at bedside per SLP with ongoing assessment.   Factors that may increase risk of adverse event in presence of aspiration [employer] Clearance Coots 2021): Dependence for feeding and/or oral hygiene;Reduced cognitive function;Frail or deconditioned  Swallow Evaluation Recommendations Recommendations: PO diet PO Diet Recommendation: Full liquid diet;Thin liquids (Level 0) Liquid Administration via: Cup;Straw Medication Administration: Crushed with puree Supervision: Full supervision/cueing for swallowing strategies;Full assist for feeding Swallowing strategies  : Small bites/sips;Slow rate Postural changes: Position pt fully upright for meals;Stay upright 30-60 min after meals Oral care recommendations: Oral care BID (2x/day)      Ellery Plunk 02/22/2023,12:35 PM

## 2023-02-22 NOTE — Progress Notes (Signed)
CBG 120 

## 2023-02-22 NOTE — Progress Notes (Signed)
CBG 122 

## 2023-02-22 NOTE — Progress Notes (Signed)
PROGRESS NOTE    Peter Kline  GNF:[id number] DOB: 1966/02/10 DOA: 02/11/2023 PCP: Quentin Angst, MD   Brief Narrative: Peter Kline is a 57 y.o. male with a history of sickle cell c disease with priapism, GERD, chronic pain, retinal hemorrhage, tobacco use, marijuana use, vitamin d deficiency.  Patient presented secondary to altered mental status and required ICU admission for airway protection. Workup significant for acute strokes, acute DVT, aspiration pneumonia with hospitalization complicated by sepsis. Workup negative for CNS infection. Patient improved enough for extubation and transfer to the floor, with persistent fevers.   Assessment and Plan:  Acute metabolic encephalopathy Acute toxic encephalopathy Multifactorial in setting of polysubstance abuse and strokes, in addition to likely aspiration pneumonia. Infectious workup negative for CNS infection. Patient still not back to baseline.  Acute embolic CVA MRI on 10/31 confirmed a subcentimeter acute infarct in the left occipital white matter in addition to MRI on 11/3 significant for scattered small new acute infarcts in the bilateral cerebral and cerebellar hemispheres. CTA head and neck negative for large vessel occlusion or other emergent finding. LDL of 49. Hemoglobin A1C of 4.8%. Transthoracic Echocardiogram significant for normal LVEF of 55-60% with grade 2 diastolic dysfunction and no atrial level shunt. Transesophageal Echocardiogram significant for positive bubble study suggesting PFO. Neurology recommendations for Heparin drip with transition to Eliquis when able. PT/OT recommendations for likely SNF pending cognitive improvement.  -Continue Heparin IV  Aspiration pneumonia Patient has been treated with antibiotics. Tracheal aspirate significant for staphylococcus epidermidis; MRSA PCR negative. Persistent fevers. Vancomycin added. CT imaging confirms bilateral lower lobe multifocal pneumonia. -Continue  Vancomycin and Zosyn  Sepsis Present on admission. Secondary to pneumonia and possible other etiology. CNS workup negative. Blood cultures (11/3) no growth. Patient with persistent fevers and leukocytosis. CT chest/abdomen/pelvis confirms bilateral lower lobe pneumonia with evidence of secretions in distal trachea/left mainstem bronchus. Urine culture (11/8) with no growth. Fevers curve appear to be finally improving, although worsened leukocytosis today -Follow-up blood cultures -CBC in AM  Acute bilateral lower extremity DVT Acute DVT involving the left posterior tibial veins and left peroneal veins. Age indeterminate DVT involving the right posterior tibial veins and right peroneal veins. Patient is on Heparin IV secondary to inconsistent oral intake. -Continue Heparin IV with plan to transition to Eliquis  Dysuria Urine culture with no growth. Symptoms are improved.  Hypernatremia Peak sodium of 150. Secondary to insensible losses and dehydration. Patient is NPO and has an NG tube. Patient started on increased free water. Sodium stable at 149 today. -Add D5 water IV fluids -Recheck BMP in AM  Acute respiratory failure with hypoxia Secondary to altered mental status. Patient was intubated on 10/30 and managed via mechanical ventilation. Patient extubated on 11/6. Patient down to 2-3 L/min of supplemental oxygen. -Wean to room air as able  Sickle cell C disease Noted. Patient is on folic acid as an outpatient. -Continue folic acid  GERD Noted.  Dysphagia NG tube placed. -Continue NG tube and tube feeds -SLP plan for modified barium swallow today   DVT prophylaxis: Lovenox Code Status:   Code Status: Full Code Family Communication: Wife at bedside Disposition Plan: Discharge likely to SNF pending improvement in fevers and mental status   Consultants:  PCCM Neurology  Procedures:  Intubation/extubation EEG Transthoracic Echocardiogram Transesophageal  Echocardiogram Lumbar puncture Small bore feeding tube insertion  Antimicrobials: Vancomycin Zosyn   Subjective: Patient states no concerns today. No trouble breathing.  Objective: BP 122/66 (BP Location: Left  Arm)   Pulse 93   Temp (!) 100.8 F (38.2 C) (Oral)   Resp 20   Ht 6' 0.01" (1.829 m)   Wt 75.9 kg   SpO2 100%   BMI 22.69 kg/m   Examination:  General exam: Appears calm and comfortable Respiratory system: Rhonchi. Respiratory effort normal. Cardiovascular system: S1 & S2 heard, RRR. Gastrointestinal system: Abdomen is nondistended, soft and nontender. Normal bowel sounds heard. Central nervous system: Alert and oriented to person and place. Musculoskeletal: No edema. No calf tenderness   Data Reviewed: I have personally reviewed following labs and imaging studies  CBC Lab Results  Component Value Date   WBC 26.2 (H) 02/22/2023   RBC 3.91 (L) 02/22/2023   HGB 10.5 (L) 02/22/2023   HCT 30.3 (L) 02/22/2023   MCV 77.5 (L) 02/22/2023   MCH 26.9 02/22/2023   PLT 293 02/22/2023   MCHC 34.7 02/22/2023   RDW 15.9 (H) 02/22/2023   LYMPHSABS 3.2 02/21/2023   MONOABS 0.8 02/21/2023   EOSABS 0.6 (H) 02/21/2023   BASOSABS 0.2 (H) 02/21/2023     Last metabolic panel Lab Results  Component Value Date   NA 149 (H) 02/22/2023   K 3.8 02/22/2023   CL 114 (H) 02/22/2023   CO2 25 02/22/2023   BUN 20 02/22/2023   CREATININE 1.10 02/22/2023   GLUCOSE 130 (H) 02/22/2023   GFRNONAA >60 02/22/2023   GFRAA 110 11/25/2018   CALCIUM 8.7 (L) 02/22/2023   PHOS 2.0 (L) 02/20/2023   PROT 7.1 02/17/2023   ALBUMIN 2.6 (L) 02/17/2023   LABGLOB 3.0 11/25/2018   AGRATIO 1.5 11/25/2018   BILITOT 1.0 02/17/2023   ALKPHOS 123 02/17/2023   AST 35 02/17/2023   ALT 47 (H) 02/17/2023   ANIONGAP 10 02/22/2023    GFR: Estimated Creatinine Clearance: 78.6 mL/min (by C-G formula based on SCr of 1.1 mg/dL).  Recent Results (from the past 240 hour(s))  Culture, Respiratory w  Gram Stain     Status: None   Collection Time: 02/13/23  8:11 AM   Specimen: Tracheal Aspirate; Respiratory  Result Value Ref Range Status   Specimen Description TRACHEAL ASPIRATE  Final   Special Requests NONE  Final   Gram Stain   Final    MODERATE WBC PRESENT, PREDOMINANTLY PMN NO ORGANISMS SEEN    Culture   Final    NO GROWTH 3 DAYS Performed at [facility], [address], [place], [place] [address]    Report Status 02/16/2023 FINAL  Final  Respiratory (~20 pathogens) panel by PCR     Status: None   Collection Time: 02/13/23  8:31 AM   Specimen: Nasopharyngeal Swab; Respiratory  Result Value Ref Range Status   Adenovirus NOT DETECTED NOT DETECTED Final   Coronavirus 229E NOT DETECTED NOT DETECTED Final    Comment: (NOTE) The Coronavirus on the Respiratory Panel, DOES NOT test for the novel  Coronavirus (2019 nCoV)    Coronavirus HKU1 NOT DETECTED NOT DETECTED Final   Coronavirus NL63 NOT DETECTED NOT DETECTED Final   Coronavirus OC43 NOT DETECTED NOT DETECTED Final   Metapneumovirus NOT DETECTED NOT DETECTED Final   Rhinovirus / Enterovirus NOT DETECTED NOT DETECTED Final   Influenza A NOT DETECTED NOT DETECTED Final   Influenza B NOT DETECTED NOT DETECTED Final   Parainfluenza Virus 1 NOT DETECTED NOT DETECTED Final   Parainfluenza Virus 2 NOT DETECTED NOT DETECTED Final   Parainfluenza Virus 3 NOT DETECTED NOT DETECTED Final   Parainfluenza  Virus 4 NOT DETECTED NOT DETECTED Final   Respiratory Syncytial Virus NOT DETECTED NOT DETECTED Final   Bordetella pertussis NOT DETECTED NOT DETECTED Final   Bordetella Parapertussis NOT DETECTED NOT DETECTED Final   Chlamydophila pneumoniae NOT DETECTED NOT DETECTED Final   Mycoplasma pneumoniae NOT DETECTED NOT DETECTED Final    Comment: Performed at [facility], [address] [place], [place] [address]  Culture, blood (Routine X 2) w Reflex to ID Panel     Status: None   Collection Time: 02/16/23  8:03 PM    Specimen: BLOOD LEFT HAND  Result Value Ref Range Status   Specimen Description BLOOD LEFT HAND  Final   Special Requests   Final    BOTTLES DRAWN AEROBIC AND ANAEROBIC Blood Culture results may not be optimal due to an excessive volume of blood received in culture bottles   Culture   Final    NO GROWTH 5 DAYS Performed at [facility], [address], [place], [place] [address]    Report Status 02/21/2023 FINAL  Final  Culture, blood (Routine X 2) w Reflex to ID Panel     Status: None   Collection Time: 02/16/23  8:09 PM   Specimen: BLOOD LEFT ARM  Result Value Ref Range Status   Specimen Description BLOOD LEFT ARM  Final   Special Requests   Final    BOTTLES DRAWN AEROBIC AND ANAEROBIC Blood Culture results may not be optimal due to an excessive volume of blood received in culture bottles   Culture   Final    NO GROWTH 5 DAYS Performed at [facility], [address], [place], [place] [address]    Report Status 02/21/2023 FINAL  Final  CSF culture w Gram Stain     Status: None   Collection Time: 02/17/23  2:35 PM   Specimen: PATH Cytology CSF; Cerebrospinal Fluid  Result Value Ref Range Status   Specimen Description CSF  Final   Special Requests NONE  Final   Gram Stain NO WBC SEEN NO ORGANISMS SEEN CYTOSPIN SMEAR   Final   Culture   Final    NO GROWTH 3 DAYS Performed at [facility], [address], [place], [place] [address]    Report Status 02/21/2023 FINAL  Final  Surgical PCR screen     Status: Abnormal   Collection Time: 02/17/23  7:47 PM   Specimen: Nasal Mucosa; Nasal Swab  Result Value Ref Range Status   MRSA, PCR (A) NEGATIVE Final    INVALID, UNABLE TO DETERMINE THE PRESENCE OF TARGET DUE TO SPECIMEN INTEGRITY. RECOLLECTION REQUESTED.    Comment: RESULT CALLED TO, READ BACK BY AND VERIFIED WITH: RN C. RUECKEL [id number] @2201  FH    Staphylococcus aureus (A) NEGATIVE Final    INVALID, UNABLE TO DETERMINE THE PRESENCE OF TARGET DUE TO SPECIMEN  INTEGRITY. RECOLLECTION REQUESTED.    Comment: Performed at [facility], [address], [place], [place] [address]  Surgical PCR screen     Status: None   Collection Time: 02/17/23 10:10 PM   Specimen: Nasal Mucosa; Nasal Swab  Result Value Ref Range Status   MRSA, PCR NEGATIVE NEGATIVE Final   Staphylococcus aureus NEGATIVE NEGATIVE Final    Comment: (NOTE) The Xpert SA Assay (FDA approved for NASAL specimens in patients 58 years of age and older), is one component of a comprehensive surveillance program. It is not intended to diagnose infection nor to guide or monitor  treatment. Performed at [facility], [address] [place], [place] [address]   Culture, Respiratory w Gram Stain     Status: None   Collection Time: 02/18/23  7:09 AM   Specimen: Tracheal Aspirate; Respiratory  Result Value Ref Range Status   Specimen Description TRACHEAL ASPIRATE  Final   Special Requests NONE  Final   Gram Stain   Final    RARE WBC PRESENT,BOTH PMN AND MONONUCLEAR NO ORGANISMS SEEN Performed at [facility], [address] [place], [place] [address]    Culture MODERATE STAPHYLOCOCCUS EPIDERMIDIS  Final   Report Status 02/20/2023 FINAL  Final   Organism ID, Bacteria STAPHYLOCOCCUS EPIDERMIDIS  Final      Susceptibility   Staphylococcus epidermidis - MIC*    CIPROFLOXACIN >=8 RESISTANT Resistant     ERYTHROMYCIN >=8 RESISTANT Resistant     GENTAMICIN <=0.5 SENSITIVE Sensitive     OXACILLIN >=4 RESISTANT Resistant     TETRACYCLINE <=1 SENSITIVE Sensitive     VANCOMYCIN 2 SENSITIVE Sensitive     TRIMETH/SULFA <=10 SENSITIVE Sensitive     CLINDAMYCIN <=0.25 SENSITIVE Sensitive     RIFAMPIN <=0.5 SENSITIVE Sensitive     Inducible Clindamycin NEGATIVE Sensitive     * MODERATE STAPHYLOCOCCUS EPIDERMIDIS  Urine Culture     Status: None   Collection Time: 02/21/23  9:47 AM   Specimen: Urine, Clean Catch  Result Value Ref Range Status   Specimen Description URINE, CLEAN  CATCH  Final   Special Requests NONE  Final   Culture   Final    NO GROWTH Performed at [facility], [address], [place], [place] [address]    Report Status 02/22/2023 FINAL  Final  Culture, blood (Routine X 2) w Reflex to ID Panel     Status: None (Preliminary result)   Collection Time: 02/21/23  1:21 PM   Specimen: BLOOD RIGHT ARM  Result Value Ref Range Status   Specimen Description BLOOD RIGHT ARM  Final   Special Requests   Final    BOTTLES DRAWN AEROBIC AND ANAEROBIC Blood Culture results may not be optimal due to an inadequate volume of blood received in culture bottles   Culture   Final    NO GROWTH < 24 HOURS Performed at [facility], [address], [place], [place] [address]    Report Status PENDING  Incomplete  Culture, blood (Routine X 2) w Reflex to ID Panel     Status: None (Preliminary result)   Collection Time: 02/21/23  1:24 PM   Specimen: BLOOD RIGHT WRIST  Result Value Ref Range Status   Specimen Description BLOOD RIGHT WRIST  Final   Special Requests   Final    BOTTLES DRAWN AEROBIC AND ANAEROBIC Blood Culture results may not be optimal due to an inadequate volume of blood received in culture bottles   Culture   Final    NO GROWTH < 24 HOURS Performed at [facility], [address] [place], [place] [address]    Report Status PENDING  Incomplete      Radiology Studies: CT CHEST ABDOMEN PELVIS W CONTRAST  Result Date: 02/21/2023 CLINICAL DATA:  Sepsis EXAM: CT CHEST, ABDOMEN, AND PELVIS WITH CONTRAST TECHNIQUE: Multidetector CT imaging of the chest, abdomen and pelvis was performed following the standard protocol during bolus administration of intravenous contrast. RADIATION DOSE REDUCTION: This exam was performed according to the departmental dose-optimization program which includes automated exposure control, adjustment  of the mA and/or kV according to patient size and/or use of iterative reconstruction technique. CONTRAST:  80mL  OMNIPAQUE IOHEXOL 350 MG/ML SOLN COMPARISON:  CT chest abdomen and pelvis 12/25/2020 FINDINGS: CT CHEST FINDINGS Cardiovascular: No significant vascular findings. Normal heart size. No pericardial effusion. Mediastinum/Nodes: Enteric tube is seen throughout the esophagus. Visualized thyroid gland is within normal limits. There are diffusely prominent mediastinal and hilar lymph nodes. Lungs/Pleura: There is a trace left pleural effusion. There is patchy airspace consolidation within the bilateral lower lobes, left greater than right and minimally within the right upper lobe. There is no pneumothorax. There secretions in the distal trachea and left mainstem bronchus. Musculoskeletal: No chest wall mass or suspicious bone lesions identified. CT ABDOMEN PELVIS FINDINGS Hepatobiliary: No focal liver abnormality is seen. Small gallstones are again seen. No gallbladder wall thickening, or biliary dilatation. Pancreas: Unremarkable. No pancreatic ductal dilatation or surrounding inflammatory changes. Spleen: The spleen is small in size and diffusely calcified, unchanged. Adrenals/Urinary Tract: There are scattered rounded hypodensities in the left kidney which are too small to characterize, likely cysts. There is no hydronephrosis or perinephric stranding. The adrenal glands and bladder are within normal limits. Stomach/Bowel: Appendix appears normal. No evidence of bowel wall thickening, distention, or inflammatory changes. There are scattered air-fluid levels throughout the colon. Rectal tube in place. Enteric tube tip is in the gastric antrum. Vascular/Lymphatic: Aortic atherosclerosis. No enlarged abdominal or pelvic lymph nodes. Reproductive: Prostate is unremarkable. Other: No abdominal wall hernia or abnormality. No abdominopelvic ascites. Musculoskeletal: Punctate radiopaque densities are seen in the soft tissues in the right gluteal region and minimally in the left gluteal region, similar to prior study. No acute  fracture. Mild chronic compression deformity of T12 is unchanged. IMPRESSION: 1. Patchy airspace consolidation in the bilateral lower lobes, left greater than right and minimally in the right upper lobe worrisome for multifocal pneumonia. 2. Trace left pleural effusion. 3. Secretions in the distal trachea and left mainstem bronchus. 4. Diffusely prominent mediastinal and hilar lymph nodes, likely reactive. 5. Cholelithiasis. 6. Scattered air-fluid levels throughout the colon compatible with diarrheal illness. 7. Stable punctate radiopaque densities in the soft tissues of the right gluteal region and minimally in the left gluteal region, similar to prior study. 8. Subcentimeter left Bosniak II renal cyst, too small to characterize. No follow-up imaging is recommended. JACR 2018 Feb; 264-273, Management of the Incidental Renal Mass on CT, [employer] 2021; [phone], Bosniak Classification of Cystic Renal Masses, Version 2019. Aortic Atherosclerosis (ICD10-I70.0). Electronically Signed   By: Darliss Cheney M.D.   On: 02/21/2023 18:05      LOS: 10 days    Jacquelin Hawking, MD [facility] 02/22/2023, 11:27 AM   If 7PM-7AM, please contact night-coverage [email or web address]

## 2023-02-22 NOTE — Plan of Care (Signed)
  Problem: Fluid Volume: Goal: Ability to maintain a balanced intake and output will improve Outcome: Progressing   Problem: Metabolic: Goal: Ability to maintain appropriate glucose levels will improve Outcome: Progressing   Problem: Nutritional: Goal: Maintenance of adequate nutrition will improve Outcome: Progressing   Problem: Skin Integrity: Goal: Risk for impaired skin integrity will decrease Outcome: Progressing   Problem: Tissue Perfusion: Goal: Adequacy of tissue perfusion will improve Outcome: Progressing   Problem: Clinical Measurements: Goal: Respiratory complications will improve Outcome: Progressing Goal: Cardiovascular complication will be avoided Outcome: Progressing   Problem: Coping: Goal: Level of anxiety will decrease Outcome: Progressing   Problem: Elimination: Goal: Will not experience complications related to bowel motility Outcome: Progressing Goal: Will not experience complications related to urinary retention Outcome: Progressing   Problem: Pain Management: Goal: General experience of comfort will improve Outcome: Progressing   Problem: Safety: Goal: Ability to remain free from injury will improve Outcome: Progressing   Problem: Skin Integrity: Goal: Risk for impaired skin integrity will decrease Outcome: Progressing

## 2023-02-22 NOTE — Progress Notes (Signed)
Speech Language Pathology Treatment: Dysphagia  Patient Details Name: Peter Kline MRN: [id number] DOB: [birth year] Today's Date: 02/22/2023 Time: 5366-4403 SLP Time Calculation (min) (ACUTE ONLY): 30 min  Assessment / Plan / Recommendation Clinical Impression  Pt seen for clinical swallow re-evaluation. Pt made NPO previous session due to concern for aspiration. Per chart, pt continues to have fluctuating fevers. WBC is elevated and chest CT was concerning for multifactorial PNA (please see report for details).   Pt accepting of PO trials including thin liquids by straw (3-4 oz), diced peaches (4 oz), and cracker x1 bite. Oral prep and transit prompt with complete oral clearance. Pharyngeal swallow initiation appeared timely with laryngeal elevation noted.  Observed occasional, subtle throat clearing post PO intake of liquids and solids, though no coughing or change in vocal quality observed today.   Given CT results concerning for pneumonia and pt's fluctuating fever, recommend instrumental swallow assessment prior to diet re-advancement. Discussed recommendation for MBSS with pt and his SO. Significant other in agreement with plan. Hopeful that pt will participate.  Plan: MBSS scheduled for later today. Continue NPO with ice chips and sips of water for comfort until MBSS is completed.    HPI HPI: Peter Kline is a 57 year old male with presented to the emergency department on 10/30 with altered mental status. Was reportedly found wandering streets of [place] altered.  Pt with respiratory failure and intubated 10/30-11/6.  MRI 10/31 negative.  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 Pt with past medical history of sickle cell c/b priapism, GERD, chronic pain, retinal hemorrhage, tobacco use, marijuana use,  vitamin D deficiency      SLP Plan  Continue with current plan of care;New goals to be determined pending instrumental study      Recommendations for follow up therapy are one component of a multi-disciplinary discharge planning process, led by the attending physician.  Recommendations may be updated based on patient status, additional functional criteria and insurance authorization.    Recommendations  Diet recommendations: NPO except ice chips and sips of water.                   Oral care QID   Frequent or constant Supervision/Assistance Dysphagia, unspecified (R13.10)     Continue with current plan of care;New goals to be determined pending instrumental study     Ellery Plunk  02/22/2023, 11:15 AM

## 2023-02-23 DIAGNOSIS — E43 Unspecified severe protein-calorie malnutrition: Secondary | ICD-10-CM | POA: Diagnosis not present

## 2023-02-23 DIAGNOSIS — I6300 Cerebral infarction due to thrombosis of unspecified precerebral artery: Secondary | ICD-10-CM

## 2023-02-23 DIAGNOSIS — J189 Pneumonia, unspecified organism: Secondary | ICD-10-CM | POA: Diagnosis not present

## 2023-02-23 DIAGNOSIS — J9601 Acute respiratory failure with hypoxia: Secondary | ICD-10-CM | POA: Diagnosis not present

## 2023-02-23 LAB — CBC
HCT: 26.9 % — ABNORMAL LOW (ref 39.0–52.0)
Hemoglobin: 9.5 g/dL — ABNORMAL LOW (ref 13.0–17.0)
MCH: 26.6 pg (ref 26.0–34.0)
MCHC: 35.3 g/dL (ref 30.0–36.0)
MCV: 75.4 fL — ABNORMAL LOW (ref 80.0–100.0)
Platelets: 355 10*3/uL (ref 150–400)
RBC: 3.57 MIL/uL — ABNORMAL LOW (ref 4.22–5.81)
RDW: 15.9 % — ABNORMAL HIGH (ref 11.5–15.5)
WBC: 30.4 10*3/uL — ABNORMAL HIGH (ref 4.0–10.5)
nRBC: 0.4 % — ABNORMAL HIGH (ref 0.0–0.2)

## 2023-02-23 LAB — BASIC METABOLIC PANEL
Anion gap: 11 (ref 5–15)
BUN: 16 mg/dL (ref 6–20)
CO2: 22 mmol/L (ref 22–32)
Calcium: 8.5 mg/dL — ABNORMAL LOW (ref 8.9–10.3)
Chloride: 111 mmol/L (ref 98–111)
Creatinine, Ser: 0.88 mg/dL (ref 0.61–1.24)
GFR, Estimated: >60 mL/min (ref >60)
Glucose, Bld: 122 mg/dL — ABNORMAL HIGH (ref 70–99)
Potassium: 3.8 mmol/L (ref 3.5–5.1)
Sodium: 144 mmol/L (ref 135–145)

## 2023-02-23 LAB — GLUCOSE, CAPILLARY
Glucose-Capillary: 145 mg/dL — ABNORMAL HIGH (ref 70–99)
Glucose-Capillary: 130 mg/dL — ABNORMAL HIGH (ref 70–99)
Glucose-Capillary: 129 mg/dL — ABNORMAL HIGH (ref 70–99)
Glucose-Capillary: 128 mg/dL — ABNORMAL HIGH (ref 70–99)
Glucose-Capillary: 150 mg/dL — ABNORMAL HIGH (ref 70–99)
Glucose-Capillary: 147 mg/dL — ABNORMAL HIGH (ref 70–99)

## 2023-02-23 LAB — VANCOMYCIN, PEAK: Vancomycin Pk: 38 ug/mL (ref 30–40)

## 2023-02-23 LAB — VANCOMYCIN, TROUGH: Vancomycin Tr: 12 ug/mL — ABNORMAL LOW (ref 15–20)

## 2023-02-23 MED ORDER — VANCOMYCIN HCL 1250 MG/250ML IV SOLN
1250.0000 mg | Freq: Two times a day (BID) | INTRAVENOUS | Status: DC
  Administered 2023-02-23 – 2023-02-26 (×6): 1250 mg via INTRAVENOUS
  Filled 2023-02-23 (×7): qty 250

## 2023-02-23 NOTE — Progress Notes (Signed)
PROGRESS NOTE    Peter Kline  WUJ:[id number] DOB: 07-09-1965 DOA: 02/11/2023 PCP: Quentin Angst, MD   Brief Narrative: Peter Kline is a 57 y.o. male with a history of sickle cell c disease with priapism, GERD, chronic pain, retinal hemorrhage, tobacco use, marijuana use, vitamin d deficiency.  Patient presented secondary to altered mental status and required ICU admission for airway protection. Workup significant for acute strokes, acute DVT, aspiration pneumonia with hospitalization complicated by sepsis. Workup negative for CNS infection. Patient improved enough for extubation and transfer to the floor, with persistent fevers.   Assessment and Plan:  Acute metabolic encephalopathy Acute toxic encephalopathy Multifactorial in setting of polysubstance abuse and strokes, in addition to likely aspiration pneumonia. Infectious workup negative for CNS infection. Patient continues to get closer to baseline.  Acute embolic CVA MRI on 10/31 confirmed a subcentimeter acute infarct in the left occipital white matter in addition to MRI on 11/3 significant for scattered small new acute infarcts in the bilateral cerebral and cerebellar hemispheres. CTA head and neck negative for large vessel occlusion or other emergent finding. LDL of 49. Hemoglobin A1C of 4.8%. Transthoracic Echocardiogram significant for normal LVEF of 55-60% with grade 2 diastolic dysfunction and no atrial level shunt. Transesophageal Echocardiogram significant for positive bubble study suggesting PFO. Neurology recommendations for Heparin drip with transition to Eliquis when able. PT/OT recommendations for likely SNF pending cognitive improvement.  -Continue Heparin IV  Aspiration pneumonia Patient has been treated with antibiotics. Tracheal aspirate significant for staphylococcus epidermidis; MRSA PCR negative. Persistent fevers. Vancomycin added. CT imaging confirms bilateral lower lobe multifocal  pneumonia. -Continue Vancomycin and Zosyn  Sepsis Present on admission. Secondary to pneumonia and possible other etiology. CNS workup negative. Blood cultures (11/3) no growth. Patient with persistent fevers and leukocytosis. CT chest/abdomen/pelvis confirms bilateral lower lobe pneumonia with evidence of secretions in distal trachea/left mainstem bronchus. Urine culture (11/8) with no growth. Fevers curve appear to be finally improving, although worsened leukocytosis. CBC still pending today -Follow-up blood cultures -Follow-up CBC  Acute bilateral lower extremity DVT Acute DVT involving the left posterior tibial veins and left peroneal veins. Age indeterminate DVT involving the right posterior tibial veins and right peroneal veins. Patient is on Heparin IV secondary to inconsistent oral intake. -Continue Heparin IV with plan to transition to Eliquis  Dysuria Urine culture with no growth. Symptoms are improved.  Hypernatremia Peak sodium of 150. Secondary to insensible losses and dehydration. Patient is NPO and has an NG tube. Patient started on increased free water. Sodium stable at 149 today. -Add D5 water IV fluids -Recheck BMP in AM  Acute respiratory failure with hypoxia Secondary to altered mental status. Patient was intubated on 10/30 and managed via mechanical ventilation. Patient extubated on 11/6. Patient down to 2-3 L/min of supplemental oxygen. -Wean to room air as able  Sickle cell C disease Noted. Patient is on folic acid as an outpatient. -Continue folic acid  GERD Noted.  Dysphagia NG tube placed. SLP evaluated and have advanced patient to a full liquid diet. -Continue NG tube and tube feeds; will discuss with dietitian to transition to nocturnal tube feeds -Continue SLP evaluation   DVT prophylaxis: Lovenox Code Status:   Code Status: Full Code Family Communication: Wife at bedside Disposition Plan: Discharge likely to SNF pending improvement in fevers and  mental status   Consultants:  PCCM Neurology  Procedures:  Intubation/extubation EEG Transthoracic Echocardiogram Transesophageal Echocardiogram Lumbar puncture Small bore feeding tube insertion  Antimicrobials:  Vancomycin Zosyn   Subjective: Patient is hoping to shower. No specific concerns.  Objective: BP 133/78 (BP Location: Left Arm)   Pulse 97   Temp 99.6 F (37.6 C)   Resp 18   Ht 6' 0.01" (1.829 m)   Wt 75.9 kg   SpO2 96%   BMI 22.69 kg/m   Examination:  General exam: Appears calm and comfortable Respiratory system: Clear to auscultation. Respiratory effort normal. Cardiovascular system: S1 & S2 heard, RRR. Gastrointestinal system: Abdomen is nondistended, soft and nontender. Normal bowel sounds heard. Central nervous system: Alert and oriented. Musculoskeletal: No edema. No calf tenderness   Data Reviewed: I have personally reviewed following labs and imaging studies  CBC Lab Results  Component Value Date   WBC 26.2 (H) 02/22/2023   RBC 3.91 (L) 02/22/2023   HGB 10.5 (L) 02/22/2023   HCT 30.3 (L) 02/22/2023   MCV 77.5 (L) 02/22/2023   MCH 26.9 02/22/2023   PLT 293 02/22/2023   MCHC 34.7 02/22/2023   RDW 15.9 (H) 02/22/2023   LYMPHSABS 3.2 02/21/2023   MONOABS 0.8 02/21/2023   EOSABS 0.6 (H) 02/21/2023   BASOSABS 0.2 (H) 02/21/2023     Last metabolic panel Lab Results  Component Value Date   NA 149 (H) 02/22/2023   K 3.8 02/22/2023   CL 114 (H) 02/22/2023   CO2 25 02/22/2023   BUN 20 02/22/2023   CREATININE 1.10 02/22/2023   GLUCOSE 130 (H) 02/22/2023   GFRNONAA >60 02/22/2023   GFRAA 110 11/25/2018   CALCIUM 8.7 (L) 02/22/2023   PHOS 2.0 (L) 02/20/2023   PROT 7.1 02/17/2023   ALBUMIN 2.6 (L) 02/17/2023   LABGLOB 3.0 11/25/2018   AGRATIO 1.5 11/25/2018   BILITOT 1.0 02/17/2023   ALKPHOS 123 02/17/2023   AST 35 02/17/2023   ALT 47 (H) 02/17/2023   ANIONGAP 10 02/22/2023    GFR: Estimated Creatinine Clearance: 78.6  mL/min (by C-G formula based on SCr of 1.1 mg/dL).  Recent Results (from the past 240 hour(s))  Culture, blood (Routine X 2) w Reflex to ID Panel     Status: None   Collection Time: 02/16/23  8:03 PM   Specimen: BLOOD LEFT HAND  Result Value Ref Range Status   Specimen Description BLOOD LEFT HAND  Final   Special Requests   Final    BOTTLES DRAWN AEROBIC AND ANAEROBIC Blood Culture results may not be optimal due to an excessive volume of blood received in culture bottles   Culture   Final    NO GROWTH 5 DAYS Performed at [facility], [address], [place], [place] [address]    Report Status 02/21/2023 FINAL  Final  Culture, blood (Routine X 2) w Reflex to ID Panel     Status: None   Collection Time: 02/16/23  8:09 PM   Specimen: BLOOD LEFT ARM  Result Value Ref Range Status   Specimen Description BLOOD LEFT ARM  Final   Special Requests   Final    BOTTLES DRAWN AEROBIC AND ANAEROBIC Blood Culture results may not be optimal due to an excessive volume of blood received in culture bottles   Culture   Final    NO GROWTH 5 DAYS Performed at [facility], [address], [place], [place] [address]    Report Status 02/21/2023 FINAL  Final  CSF culture w Gram Stain     Status: None   Collection Time: 02/17/23  2:35 PM   Specimen: PATH Cytology  CSF; Cerebrospinal Fluid  Result Value Ref Range Status   Specimen Description CSF  Final   Special Requests NONE  Final   Gram Stain NO WBC SEEN NO ORGANISMS SEEN CYTOSPIN SMEAR   Final   Culture   Final    NO GROWTH 3 DAYS Performed at [facility], [address] [place], [place] [address]    Report Status 02/21/2023 FINAL  Final  Surgical PCR screen     Status: Abnormal   Collection Time: 02/17/23  7:47 PM   Specimen: Nasal Mucosa; Nasal Swab  Result Value Ref Range Status   MRSA, PCR (A) NEGATIVE Final    INVALID, UNABLE TO DETERMINE THE PRESENCE OF TARGET DUE TO SPECIMEN INTEGRITY. RECOLLECTION REQUESTED.     Comment: RESULT CALLED TO, READ BACK BY AND VERIFIED WITH: RN C. RUECKEL [id number] @2201  FH    Staphylococcus aureus (A) NEGATIVE Final    INVALID, UNABLE TO DETERMINE THE PRESENCE OF TARGET DUE TO SPECIMEN INTEGRITY. RECOLLECTION REQUESTED.    Comment: Performed at [facility], [address], [place], [place] [address]  Surgical PCR screen     Status: None   Collection Time: 02/17/23 10:10 PM   Specimen: Nasal Mucosa; Nasal Swab  Result Value Ref Range Status   MRSA, PCR NEGATIVE NEGATIVE Final   Staphylococcus aureus NEGATIVE NEGATIVE Final    Comment: (NOTE) The Xpert SA Assay (FDA approved for NASAL specimens in patients 16 years of age and older), is one component of a comprehensive surveillance program. It is not intended to diagnose infection nor to guide or monitor treatment. Performed at [facility], [address] [place], [place] [address]   Culture, Respiratory w Gram Stain     Status: None   Collection Time: 02/18/23  7:09 AM   Specimen: Tracheal Aspirate; Respiratory  Result Value Ref Range Status   Specimen Description TRACHEAL ASPIRATE  Final   Special Requests NONE  Final   Gram Stain   Final    RARE WBC PRESENT,BOTH PMN AND MONONUCLEAR NO ORGANISMS SEEN Performed at [facility], [address] [place], [place] [address]    Culture MODERATE STAPHYLOCOCCUS EPIDERMIDIS  Final   Report Status 02/20/2023 FINAL  Final   Organism ID, Bacteria STAPHYLOCOCCUS EPIDERMIDIS  Final      Susceptibility   Staphylococcus epidermidis - MIC*    CIPROFLOXACIN >=8 RESISTANT Resistant     ERYTHROMYCIN >=8 RESISTANT Resistant     GENTAMICIN <=0.5 SENSITIVE Sensitive     OXACILLIN >=4 RESISTANT Resistant     TETRACYCLINE <=1 SENSITIVE Sensitive     VANCOMYCIN 2 SENSITIVE Sensitive     TRIMETH/SULFA <=10 SENSITIVE Sensitive     CLINDAMYCIN <=0.25 SENSITIVE Sensitive     RIFAMPIN <=0.5 SENSITIVE Sensitive     Inducible Clindamycin NEGATIVE Sensitive     *  MODERATE STAPHYLOCOCCUS EPIDERMIDIS  Urine Culture     Status: None   Collection Time: 02/21/23  9:47 AM   Specimen: Urine, Clean Catch  Result Value Ref Range Status   Specimen Description URINE, CLEAN CATCH  Final   Special Requests NONE  Final   Culture   Final    NO GROWTH Performed at [facility], [address], [place], [place] [address]    Report Status 02/22/2023 FINAL  Final  Culture, blood (Routine X 2) w Reflex to ID Panel     Status: None (Preliminary result)   Collection Time: 02/21/23  1:21  PM   Specimen: BLOOD RIGHT ARM  Result Value Ref Range Status   Specimen Description BLOOD RIGHT ARM  Final   Special Requests   Final    BOTTLES DRAWN AEROBIC AND ANAEROBIC Blood Culture results may not be optimal due to an inadequate volume of blood received in culture bottles   Culture   Final    NO GROWTH 2 DAYS Performed at [facility], [address], [place], [place] [address]    Report Status PENDING  Incomplete  Culture, blood (Routine X 2) w Reflex to ID Panel     Status: None (Preliminary result)   Collection Time: 02/21/23  1:24 PM   Specimen: BLOOD RIGHT WRIST  Result Value Ref Range Status   Specimen Description BLOOD RIGHT WRIST  Final   Special Requests   Final    BOTTLES DRAWN AEROBIC AND ANAEROBIC Blood Culture results may not be optimal due to an inadequate volume of blood received in culture bottles   Culture   Final    NO GROWTH 2 DAYS Performed at [facility], [address] [place], [place] [address]    Report Status PENDING  Incomplete      Radiology Studies: CT CHEST ABDOMEN PELVIS W CONTRAST  Result Date: 02/21/2023 CLINICAL DATA:  Sepsis EXAM: CT CHEST, ABDOMEN, AND PELVIS WITH CONTRAST TECHNIQUE: Multidetector CT imaging of the chest, abdomen and pelvis was performed following the standard protocol during bolus administration of intravenous contrast. RADIATION DOSE REDUCTION: This exam was performed according to the  departmental dose-optimization program which includes automated exposure control, adjustment of the mA and/or kV according to patient size and/or use of iterative reconstruction technique. CONTRAST:  80mL OMNIPAQUE IOHEXOL 350 MG/ML SOLN COMPARISON:  CT chest abdomen and pelvis 12/25/2020 FINDINGS: CT CHEST FINDINGS Cardiovascular: No significant vascular findings. Normal heart size. No pericardial effusion. Mediastinum/Nodes: Enteric tube is seen throughout the esophagus. Visualized thyroid gland is within normal limits. There are diffusely prominent mediastinal and hilar lymph nodes. Lungs/Pleura: There is a trace left pleural effusion. There is patchy airspace consolidation within the bilateral lower lobes, left greater than right and minimally within the right upper lobe. There is no pneumothorax. There secretions in the distal trachea and left mainstem bronchus. Musculoskeletal: No chest wall mass or suspicious bone lesions identified. CT ABDOMEN PELVIS FINDINGS Hepatobiliary: No focal liver abnormality is seen. Small gallstones are again seen. No gallbladder wall thickening, or biliary dilatation. Pancreas: Unremarkable. No pancreatic ductal dilatation or surrounding inflammatory changes. Spleen: The spleen is small in size and diffusely calcified, unchanged. Adrenals/Urinary Tract: There are scattered rounded hypodensities in the left kidney which are too small to characterize, likely cysts. There is no hydronephrosis or perinephric stranding. The adrenal glands and bladder are within normal limits. Stomach/Bowel: Appendix appears normal. No evidence of bowel wall thickening, distention, or inflammatory changes. There are scattered air-fluid levels throughout the colon. Rectal tube in place. Enteric tube tip is in the gastric antrum. Vascular/Lymphatic: Aortic atherosclerosis. No enlarged abdominal or pelvic lymph nodes. Reproductive: Prostate is unremarkable. Other: No abdominal wall hernia or abnormality.  No abdominopelvic ascites. Musculoskeletal: Punctate radiopaque densities are seen in the soft tissues in the right gluteal region and minimally in the left gluteal region, similar to prior study. No acute fracture. Mild chronic compression deformity of T12 is unchanged. IMPRESSION: 1. Patchy airspace consolidation in the bilateral lower lobes, left greater than right and minimally in the right upper lobe worrisome for multifocal pneumonia. 2. Trace  left pleural effusion. 3. Secretions in the distal trachea and left mainstem bronchus. 4. Diffusely prominent mediastinal and hilar lymph nodes, likely reactive. 5. Cholelithiasis. 6. Scattered air-fluid levels throughout the colon compatible with diarrheal illness. 7. Stable punctate radiopaque densities in the soft tissues of the right gluteal region and minimally in the left gluteal region, similar to prior study. 8. Subcentimeter left Bosniak II renal cyst, too small to characterize. No follow-up imaging is recommended. JACR 2018 Feb; 264-273, Management of the Incidental Renal Mass on CT, [employer] 2021; [phone], Bosniak Classification of Cystic Renal Masses, Version 2019. Aortic Atherosclerosis (ICD10-I70.0). Electronically Signed   By: Darliss Cheney M.D.   On: 02/21/2023 18:05      LOS: 11 days    Jacquelin Hawking, MD [facility] 02/23/2023, 8:45 AM   If 7PM-7AM, please contact night-coverage [email or web address]

## 2023-02-23 NOTE — Progress Notes (Signed)
Pharmacy Antibiotic Note  Peter Kline is a 57 y.o. male admitted on 02/11/2023 with continued aspiration with possible pneumonia. Discussed with MD on 11/5, MRSA swab negative 11/4, all blood cultures negative. Plan to hold vancomycin. Trach aspirate culture from 11/5 growing moderate Staphylococcus epidermidis. Pharmacy has been consulted for vancomycin dosing.   WBC trending up to 30.4 and Tmax 101.3 despite continued antibiotics SCr 0.88, stable Vanc peak 38, trough 12 with current schedule Vancomycin 1500mg  IV q12h>> calculated AUC 608 with current peak and trough, supratherapeutic  Plan: Decrease vancomycin to 1250mg  IV every 12 hours    > eAUC 506, eVT 10.6, eVP 36    > Goal AUC 400-550    > Check next level as indicated per pharmacy protocol Monitor daily CBC, temp, SCr, and for clinical signs of improvement  F/u antibiotic duration of treatment  Height: 6' 0.01" (182.9 cm) Weight: 75.9 kg (167 lb 5.3 oz) IBW/kg (Calculated) : 77.62  Temp (24hrs), Avg:99.6 F (37.6 C), Min:98.2 F (36.8 C), Max:101.3 F (38.5 C)  Recent Labs  Lab 02/19/23 0545 02/20/23 0301 02/21/23 0523 02/21/23 1321 02/22/23 0428 02/22/23 2336 02/23/23 0930  WBC 21.5* 22.6* 21.2*  22.1*  --  26.2*  --  30.4*  CREATININE 0.78 0.91 1.06  --  1.10  --  0.88  LATICACIDVEN  --   --   --  1.4  --   --   --   VANCOTROUGH  --   --   --   --   --   --  12*  VANCOPEAK  --   --   --   --   --  38  --     Estimated Creatinine Clearance: 98.2 mL/min (by C-G formula based on SCr of 0.88 mg/dL).    No Known Allergies  Antimicrobials this admission: CTX 10/30 >>11/1, 11/4 >> 11/5 Doxy 10/31 >> 11/1 Acyclovir 10/30  Ampicillin 10/30  Vancomycin 10/30  Decadron (meningitis) 10/30 Unasyn 11/1 >> 11/4 Piptazo 11/5 >> 11/7 Vanc 11/7 >>   Microbiology:  10/29 MRSA neg 10/29 strep pneumo urine neg 10/29 Bcx: negative 10/31 TA - negative 11/3 BCx - ngtd 11/4 CSF - ngtd 11/4 MRSA neg  11/5 TA -  MRSE 11/8 Bcx: no growth 2 days   Thank you for allowing pharmacy to be a part of this patient's care.  Stephenie Acres, PharmD PGY1 Pharmacy Resident 02/23/2023 11:10 AM

## 2023-02-23 NOTE — Plan of Care (Signed)
  Problem: Education: Goal: Ability to describe self-care measures that may prevent or decrease complications (Diabetes Survival Skills Education) will improve Outcome: Progressing Goal: Individualized Educational Video(s) Outcome: Progressing   Problem: Coping: Goal: Ability to adjust to condition or change in health will improve Outcome: Progressing   Problem: Fluid Volume: Goal: Ability to maintain a balanced intake and output will improve Outcome: Progressing   Problem: Health Behavior/Discharge Planning: Goal: Ability to identify and utilize available resources and services will improve Outcome: Progressing Goal: Ability to manage health-related needs will improve Outcome: Progressing   Problem: Metabolic: Goal: Ability to maintain appropriate glucose levels will improve Outcome: Progressing   Problem: Nutritional: Goal: Maintenance of adequate nutrition will improve Outcome: Progressing Goal: Progress toward achieving an optimal weight will improve Outcome: Progressing   Problem: Skin Integrity: Goal: Risk for impaired skin integrity will decrease Outcome: Progressing   Problem: Tissue Perfusion: Goal: Adequacy of tissue perfusion will improve Outcome: Progressing   Problem: Safety: Goal: Non-violent Restraint(s) Outcome: Progressing   Problem: Education: Goal: Knowledge of General Education information will improve Description: Including pain rating scale, medication(s)/side effects and non-pharmacologic comfort measures Outcome: Progressing   Problem: Health Behavior/Discharge Planning: Goal: Ability to manage health-related needs will improve Outcome: Progressing   Problem: Clinical Measurements: Goal: Ability to maintain clinical measurements within normal limits will improve Outcome: Progressing Goal: Will remain free from infection Outcome: Progressing Goal: Diagnostic test results will improve Outcome: Progressing Goal: Respiratory complications  will improve Outcome: Progressing Goal: Cardiovascular complication will be avoided Outcome: Progressing   Problem: Activity: Goal: Risk for activity intolerance will decrease Outcome: Progressing   Problem: Nutrition: Goal: Adequate nutrition will be maintained Outcome: Progressing   Problem: Coping: Goal: Level of anxiety will decrease Outcome: Progressing   Problem: Elimination: Goal: Will not experience complications related to bowel motility Outcome: Progressing Goal: Will not experience complications related to urinary retention Outcome: Progressing   Problem: Safety: Goal: Ability to remain free from injury will improve Outcome: Progressing   Problem: Skin Integrity: Goal: Risk for impaired skin integrity will decrease Outcome: Progressing

## 2023-02-24 ENCOUNTER — Other Ambulatory Visit (HOSPITAL_COMMUNITY): Payer: Self-pay

## 2023-02-24 DIAGNOSIS — I6300 Cerebral infarction due to thrombosis of unspecified precerebral artery: Secondary | ICD-10-CM | POA: Diagnosis not present

## 2023-02-24 DIAGNOSIS — E43 Unspecified severe protein-calorie malnutrition: Secondary | ICD-10-CM | POA: Diagnosis not present

## 2023-02-24 DIAGNOSIS — J189 Pneumonia, unspecified organism: Secondary | ICD-10-CM | POA: Diagnosis not present

## 2023-02-24 DIAGNOSIS — J9601 Acute respiratory failure with hypoxia: Secondary | ICD-10-CM | POA: Diagnosis not present

## 2023-02-24 LAB — CBC
HCT: 24.4 % — ABNORMAL LOW (ref 39.0–52.0)
Hemoglobin: 8.5 g/dL — ABNORMAL LOW (ref 13.0–17.0)
MCH: 26.1 pg (ref 26.0–34.0)
MCHC: 34.8 g/dL (ref 30.0–36.0)
MCV: 74.8 fL — ABNORMAL LOW (ref 80.0–100.0)
Platelets: 383 10*3/uL (ref 150–400)
RBC: 3.26 MIL/uL — ABNORMAL LOW (ref 4.22–5.81)
RDW: 15.9 % — ABNORMAL HIGH (ref 11.5–15.5)
WBC: 25 10*3/uL — ABNORMAL HIGH (ref 4.0–10.5)
nRBC: 0.8 % — ABNORMAL HIGH (ref 0.0–0.2)

## 2023-02-24 LAB — GLUCOSE, CAPILLARY
Glucose-Capillary: 124 mg/dL — ABNORMAL HIGH (ref 70–99)
Glucose-Capillary: 140 mg/dL — ABNORMAL HIGH (ref 70–99)
Glucose-Capillary: 114 mg/dL — ABNORMAL HIGH (ref 70–99)
Glucose-Capillary: 100 mg/dL — ABNORMAL HIGH (ref 70–99)
Glucose-Capillary: 152 mg/dL — ABNORMAL HIGH (ref 70–99)
Glucose-Capillary: 131 mg/dL — ABNORMAL HIGH (ref 70–99)

## 2023-02-24 MED ORDER — ADULT MULTIVITAMIN W/MINERALS CH
1.0000 | ORAL_TABLET | Freq: Every day | ORAL | Status: DC
  Administered 2023-02-25 – 2023-03-03 (×7): 1 via ORAL
  Filled 2023-02-24 (×7): qty 1

## 2023-02-24 MED ORDER — APIXABAN 5 MG PO TABS
5.0000 mg | ORAL_TABLET | Freq: Two times a day (BID) | ORAL | Status: DC
  Administered 2023-02-24 – 2023-03-03 (×13): 5 mg via ORAL
  Filled 2023-02-24 (×15): qty 1

## 2023-02-24 MED ORDER — FOLIC ACID 1 MG PO TABS
1.0000 mg | ORAL_TABLET | Freq: Every day | ORAL | Status: DC
  Administered 2023-02-25 – 2023-03-03 (×7): 1 mg via ORAL
  Filled 2023-02-24 (×7): qty 1

## 2023-02-24 MED ORDER — OXYCODONE HCL 5 MG PO TABS
5.0000 mg | ORAL_TABLET | ORAL | Status: DC | PRN
  Administered 2023-02-25 – 2023-03-02 (×4): 5 mg via ORAL
  Filled 2023-02-24 (×4): qty 1

## 2023-02-24 MED ORDER — ACETAMINOPHEN 500 MG PO TABS
1000.0000 mg | ORAL_TABLET | Freq: Four times a day (QID) | ORAL | Status: DC
  Administered 2023-02-24 – 2023-02-28 (×14): 1000 mg via ORAL
  Filled 2023-02-24 (×14): qty 2

## 2023-02-24 MED ORDER — OSMOLITE 1.5 CAL PO LIQD
1000.0000 mL | ORAL | Status: DC
  Administered 2023-02-24 – 2023-02-25 (×2): 1000 mL

## 2023-02-24 MED ORDER — THIAMINE MONONITRATE 100 MG PO TABS
100.0000 mg | ORAL_TABLET | Freq: Every day | ORAL | Status: DC
  Administered 2023-02-25 – 2023-03-03 (×7): 100 mg via ORAL
  Filled 2023-02-24 (×7): qty 1

## 2023-02-24 NOTE — Progress Notes (Signed)
Patient pulled out his flexiseal with the bulb fully intact I did not see any trauma but left tube out for night.

## 2023-02-24 NOTE — Progress Notes (Signed)
PROGRESS NOTE    Peter Kline  GNF:[id number] DOB: 27-Jul-1965 DOA: 02/11/2023 PCP: Quentin Angst, MD   Brief Narrative: Peter Kline is a 57 y.o. male with a history of sickle cell c disease with priapism, GERD, chronic pain, retinal hemorrhage, tobacco use, marijuana use, vitamin d deficiency.  Patient presented secondary to altered mental status and required ICU admission for airway protection. Workup significant for acute strokes, acute DVT, aspiration pneumonia with hospitalization complicated by sepsis. Workup negative for CNS infection. Patient improved enough for extubation and transfer to the floor, with persistent fevers.   Assessment and Plan:  Acute metabolic encephalopathy Acute toxic encephalopathy Multifactorial in setting of polysubstance abuse and strokes, in addition to likely aspiration pneumonia. Infectious workup negative for CNS infection. Patient continues to get closer to baseline.  Acute embolic CVA MRI on 10/31 confirmed a subcentimeter acute infarct in the left occipital white matter in addition to MRI on 11/3 significant for scattered small new acute infarcts in the bilateral cerebral and cerebellar hemispheres. CTA head and neck negative for large vessel occlusion or other emergent finding. LDL of 49. Hemoglobin A1C of 4.8%. Transthoracic Echocardiogram significant for normal LVEF of 55-60% with grade 2 diastolic dysfunction and no atrial level shunt. Transesophageal Echocardiogram significant for positive bubble study suggesting PFO. Neurology recommendations for Heparin drip with transition to Eliquis when able. PT/OT recommendations for likely SNF pending cognitive improvement. Heparin discontinued -Continue Eliquis  Aspiration pneumonia Patient has been treated with antibiotics. Tracheal aspirate significant for staphylococcus epidermidis; MRSA PCR negative. Persistent fevers. Vancomycin added. CT imaging confirms bilateral lower lobe multifocal  pneumonia. -Continue Vancomycin and Zosyn for several days from improvement/resolution of fevers and leukocytosis  Sepsis Present on admission. Secondary to pneumonia and possible other etiology. CNS workup negative. Blood cultures (11/3) no growth. Patient with persistent fevers and leukocytosis. CT chest/abdomen/pelvis confirms bilateral lower lobe pneumonia with evidence of secretions in distal trachea/left mainstem bronchus. Urine culture (11/8) with no growth. Fevers curve appear to be finally improving, although worsened leukocytosis. CBC now trending downward. -Follow-up blood cultures -Follow-up CBC  Acute bilateral lower extremity DVT Acute DVT involving the left posterior tibial veins and left peroneal veins. Age indeterminate DVT involving the right posterior tibial veins and right peroneal veins. Patient managed on heparin and transitioned to Eliquis. -Continue Eliquis PO  Dysuria Urine culture with no growth. Symptoms are improved.  Hypernatremia Peak sodium of 150. Secondary to insensible losses and dehydration. Patient is NPO and has an NG tube. Patient started on increased free water. Sodium improved with IV fluids  Acute respiratory failure with hypoxia Secondary to altered mental status. Patient was intubated on 10/30 and managed via mechanical ventilation. Patient extubated on 11/6. Patient down to room air.  Sickle cell C disease Noted. Patient is on folic acid as an outpatient. -Continue folic acid  GERD Noted.  Dysphagia NG tube placed. SLP evaluated and have advanced patient to a full liquid diet. -Continue NG tube and tube feeds; transition to nocturnal tube feeds -Continue SLP evaluation -Full liquid diet   DVT prophylaxis: Eliquis Code Status:   Code Status: Full Code Family Communication: Wife at bedside Disposition Plan: Discharge likely to SNF pending improvement in fevers and mental status in addition to transition to an outpatient antibiotic  regimen and eventual removal of NG tube   Consultants:  PCCM Neurology  Procedures:  Intubation/extubation EEG Transthoracic Echocardiogram Transesophageal Echocardiogram Lumbar puncture Small bore feeding tube insertion  Antimicrobials: Vancomycin Zosyn  Subjective: No concerns from overnight.  Objective: BP 135/80 (BP Location: Left Arm)   Pulse 83   Temp 99.3 F (37.4 C)   Resp 18   Ht 6' 0.01" (1.829 m)   Wt 79.9 kg   SpO2 100%   BMI 23.88 kg/m   Examination:  General exam: Appears calm and comfortable Respiratory system: Clear to auscultation. Respiratory effort normal. Cardiovascular system: S1 & S2 heard, RRR. No murmurs. Gastrointestinal system: Abdomen is nondistended, soft and nontender.  Normal bowel sounds heard. Central nervous system: Alert and oriented. Musculoskeletal: No edema. No calf tenderness   Data Reviewed: I have personally reviewed following labs and imaging studies  CBC Lab Results  Component Value Date   WBC 25.0 (H) 02/24/2023   RBC 3.26 (L) 02/24/2023   HGB 8.5 (L) 02/24/2023   HCT 24.4 (L) 02/24/2023   MCV 74.8 (L) 02/24/2023   MCH 26.1 02/24/2023   PLT 383 02/24/2023   MCHC 34.8 02/24/2023   RDW 15.9 (H) 02/24/2023   LYMPHSABS 3.2 02/21/2023   MONOABS 0.8 02/21/2023   EOSABS 0.6 (H) 02/21/2023   BASOSABS 0.2 (H) 02/21/2023     Last metabolic panel Lab Results  Component Value Date   NA 144 02/23/2023   K 3.8 02/23/2023   CL 111 02/23/2023   CO2 22 02/23/2023   BUN 16 02/23/2023   CREATININE 0.88 02/23/2023   GLUCOSE 122 (H) 02/23/2023   GFRNONAA >60 02/23/2023   GFRAA 110 11/25/2018   CALCIUM 8.5 (L) 02/23/2023   PHOS 2.0 (L) 02/20/2023   PROT 7.1 02/17/2023   ALBUMIN 2.6 (L) 02/17/2023   LABGLOB 3.0 11/25/2018   AGRATIO 1.5 11/25/2018   BILITOT 1.0 02/17/2023   ALKPHOS 123 02/17/2023   AST 35 02/17/2023   ALT 47 (H) 02/17/2023   ANIONGAP 11 02/23/2023    GFR: Estimated Creatinine Clearance:  100.4 mL/min (by C-G formula based on SCr of 0.88 mg/dL).  Recent Results (from the past 240 hour(s))  Culture, blood (Routine X 2) w Reflex to ID Panel     Status: None   Collection Time: 02/16/23  8:03 PM   Specimen: BLOOD LEFT HAND  Result Value Ref Range Status   Specimen Description BLOOD LEFT HAND  Final   Special Requests   Final    BOTTLES DRAWN AEROBIC AND ANAEROBIC Blood Culture results may not be optimal due to an excessive volume of blood received in culture bottles   Culture   Final    NO GROWTH 5 DAYS Performed at [facility], [address], [place], [place] [address]    Report Status 02/21/2023 FINAL  Final  Culture, blood (Routine X 2) w Reflex to ID Panel     Status: None   Collection Time: 02/16/23  8:09 PM   Specimen: BLOOD LEFT ARM  Result Value Ref Range Status   Specimen Description BLOOD LEFT ARM  Final   Special Requests   Final    BOTTLES DRAWN AEROBIC AND ANAEROBIC Blood Culture results may not be optimal due to an excessive volume of blood received in culture bottles   Culture   Final    NO GROWTH 5 DAYS Performed at [facility], [address] [place], [place] [address]    Report Status 02/21/2023 FINAL  Final  CSF culture w Gram Stain     Status: None   Collection Time: 02/17/23  2:35 PM   Specimen: PATH Cytology CSF; Cerebrospinal Fluid  Result Value Ref  Range Status   Specimen Description CSF  Final   Special Requests NONE  Final   Gram Stain NO WBC SEEN NO ORGANISMS SEEN CYTOSPIN SMEAR   Final   Culture   Final    NO GROWTH 3 DAYS Performed at [facility], [address], [place], [place] [address]    Report Status 02/21/2023 FINAL  Final  Surgical PCR screen     Status: Abnormal   Collection Time: 02/17/23  7:47 PM   Specimen: Nasal Mucosa; Nasal Swab  Result Value Ref Range Status   MRSA, PCR (A) NEGATIVE Final    INVALID, UNABLE TO DETERMINE THE PRESENCE OF TARGET DUE TO SPECIMEN INTEGRITY. RECOLLECTION  REQUESTED.    Comment: RESULT CALLED TO, READ BACK BY AND VERIFIED WITH: RN C. RUECKEL [id number] @2201  FH    Staphylococcus aureus (A) NEGATIVE Final    INVALID, UNABLE TO DETERMINE THE PRESENCE OF TARGET DUE TO SPECIMEN INTEGRITY. RECOLLECTION REQUESTED.    Comment: Performed at [facility], [address], [place], [place] [address]  Surgical PCR screen     Status: None   Collection Time: 02/17/23 10:10 PM   Specimen: Nasal Mucosa; Nasal Swab  Result Value Ref Range Status   MRSA, PCR NEGATIVE NEGATIVE Final   Staphylococcus aureus NEGATIVE NEGATIVE Final    Comment: (NOTE) The Xpert SA Assay (FDA approved for NASAL specimens in patients 72 years of age and older), is one component of a comprehensive surveillance program. It is not intended to diagnose infection nor to guide or monitor treatment. Performed at [facility], [address], [place], [place] [address]   Culture, Respiratory w Gram Stain     Status: None   Collection Time: 02/18/23  7:09 AM   Specimen: Tracheal Aspirate; Respiratory  Result Value Ref Range Status   Specimen Description TRACHEAL ASPIRATE  Final   Special Requests NONE  Final   Gram Stain   Final    RARE WBC PRESENT,BOTH PMN AND MONONUCLEAR NO ORGANISMS SEEN Performed at [facility], [address] [place], [place] [address]    Culture MODERATE STAPHYLOCOCCUS EPIDERMIDIS  Final   Report Status 02/20/2023 FINAL  Final   Organism ID, Bacteria STAPHYLOCOCCUS EPIDERMIDIS  Final      Susceptibility   Staphylococcus epidermidis - MIC*    CIPROFLOXACIN >=8 RESISTANT Resistant     ERYTHROMYCIN >=8 RESISTANT Resistant     GENTAMICIN <=0.5 SENSITIVE Sensitive     OXACILLIN >=4 RESISTANT Resistant     TETRACYCLINE <=1 SENSITIVE Sensitive     VANCOMYCIN 2 SENSITIVE Sensitive     TRIMETH/SULFA <=10 SENSITIVE Sensitive     CLINDAMYCIN <=0.25 SENSITIVE Sensitive     RIFAMPIN <=0.5 SENSITIVE Sensitive     Inducible Clindamycin NEGATIVE  Sensitive     * MODERATE STAPHYLOCOCCUS EPIDERMIDIS  Urine Culture     Status: None   Collection Time: 02/21/23  9:47 AM   Specimen: Urine, Clean Catch  Result Value Ref Range Status   Specimen Description URINE, CLEAN CATCH  Final   Special Requests NONE  Final   Culture   Final    NO GROWTH Performed at [facility], [address], [place], [place] [address]    Report Status 02/22/2023 FINAL  Final  Culture, blood (Routine X 2) w Reflex to ID Panel     Status: None (Preliminary result)   Collection Time: 02/21/23  1:21 PM   Specimen: BLOOD RIGHT ARM  Result Value Ref Range Status   Specimen Description BLOOD RIGHT ARM  Final   Special Requests   Final    BOTTLES DRAWN AEROBIC AND ANAEROBIC Blood Culture results may not be optimal due to an inadequate volume of blood received in culture bottles   Culture   Final    NO GROWTH 3 DAYS Performed at [facility], [address], [place], [place] [address]    Report Status PENDING  Incomplete  Culture, blood (Routine X 2) w Reflex to ID Panel     Status: None (Preliminary result)   Collection Time: 02/21/23  1:24 PM   Specimen: BLOOD RIGHT WRIST  Result Value Ref Range Status   Specimen Description BLOOD RIGHT WRIST  Final   Special Requests   Final    BOTTLES DRAWN AEROBIC AND ANAEROBIC Blood Culture results may not be optimal due to an inadequate volume of blood received in culture bottles   Culture   Final    NO GROWTH 3 DAYS Performed at [facility], [address] [place], [place] [address]    Report Status PENDING  Incomplete      Radiology Studies: No results found.    LOS: 12 days    Jacquelin Hawking, MD [facility] 02/24/2023, 10:01 AM   If 7PM-7AM, please contact night-coverage [email or web address]

## 2023-02-24 NOTE — Progress Notes (Signed)
Physical Therapy Treatment Patient Details Name: Peter Kline MRN: [id number] DOB: January 09, 1966 Today's Date: 02/24/2023   History of Present Illness Pt is a 57 y/o male presenting to the ED on 10/30 with AMS, found wandering the streets [facility] confused with slurred speech.  Initially negative for stroke.  Mental status failed to improve, pt became more agitated and eventually was intubated for airway protection.  Chest xray showed multifocal PNA.  11/1 had small aspiration event that led to further agitation and further aspiration, 11/3 repeat MRI showed scattered new infarct in bil cerebrum and cerebellum, 11/5  bil LE DVT+  PMHx:  sickle cell anemia, lap repair of gastric ulcer.    PT Comments  Pt greeted resting in bed, eager for mobility and demonstrating good progress towards acute goals. Pt following all simple single step commands throughout session with pt demonstrating some impulsivity, attempting to ambulate away from EOB before gaining balance and this PTA ready. Physically pt requiring min A to complete bed mobility and mod A for transfers and gait with RW support. Pt with posterior bias initially on standing, however able to correct with cues. Pt with noted ataxic gait needing assist to maintain balance and max cues for safety awareness. Discussed with supervising PT and updated recommendation as patient will benefit from intensive inpatient follow up therapy, >3 hours/day. Pt continues to benefit from skilled PT services to progress toward functional mobility goals.      If plan is discharge home, recommend the following: A lot of help with walking and/or transfers;A lot of help with bathing/dressing/bathroom;Assistance with cooking/housework;Direct supervision/assist for medications management;Direct supervision/assist for financial management;Assist for transportation;Help with stairs or ramp for entrance   Can travel by private vehicle        Equipment Recommendations   Other (comment) (TBD)    Recommendations for Other Services Rehab consult     Precautions / Restrictions Precautions Precautions: Fall Precaution Comments: flexi-seal Restrictions Weight Bearing Restrictions: No     Mobility  Bed Mobility Overal bed mobility: Needs Assistance Bed Mobility: Supine to Sit     Supine to sit: Min assist     General bed mobility comments: light min A to elevate trunk    Transfers Overall transfer level: Needs assistance Equipment used: Rolling walker (2 wheels) Transfers: Sit to/from Stand Sit to Stand: Mod assist (x2)           General transfer comment: mod A to power up to stand, pt with posterior lean once standing, able to come to stand x2 during session    Ambulation/Gait Ambulation/Gait assistance: Mod assist Gait Distance (Feet): 20 Feet Assistive device: Rolling walker (2 wheels) Gait Pattern/deviations: Step-through pattern, Decreased stance time - right, Decreased stride length, Drifts right/left Gait velocity: decr     General Gait Details: mod A to maintain balance, decreased stance time on R with some L lateral lean, pt needing cues to slow to safe speed. pt with 3-4/4 DOE, SpO2 >90% on RA; HR 103   Stairs             Wheelchair Mobility     Tilt Bed    Modified Rankin (Stroke Patients Only)       Balance Overall balance assessment: Needs assistance Sitting-balance support: Bilateral upper extremity supported, Single extremity supported, No upper extremity supported, Feet supported Sitting balance-Leahy Scale: Fair Sitting balance - Comments: able to maintain without UE support   Standing balance support: Bilateral upper extremity supported Standing balance-Leahy Scale: Poor Standing balance  comment: reliant on UE support and external assist                            Cognition Arousal: Alert Behavior During Therapy: Impulsive Overall Cognitive Status: Impaired/Different from  baseline Area of Impairment: Orientation, Attention, Following commands, Safety/judgement, Awareness, Problem solving                 Orientation Level: Disoriented to, Time (able to state year, when asked month kept repeating year x3 then answering September)     Following Commands: Follows one step commands consistently Safety/Judgement: Decreased awareness of safety, Decreased awareness of deficits   Problem Solving: Slow processing, Decreased initiation General Comments: pt following all single step commands, some impulsivity tyring to come to stand and ambulate away from EOB before gaining balance        Exercises      General Comments General comments (skin integrity, edema, etc.): VSS, pt spouse present and supportive      Pertinent Vitals/Pain Pain Assessment Pain Assessment: No/denies pain    Home Living                          Prior Function            PT Goals (current goals can now be found in the care plan section) Acute Rehab PT Goals Patient Stated Goal: to take a shower PT Goal Formulation: Patient unable to participate in goal setting Time For Goal Achievement: 03/06/23 Progress towards PT goals: Progressing toward goals    Frequency    Min 1X/week      PT Plan      Co-evaluation              AM-PAC PT "6 Clicks" Mobility   Outcome Measure  Help needed turning from your back to your side while in a flat bed without using bedrails?: A Lot Help needed moving from lying on your back to sitting on the side of a flat bed without using bedrails?: A Lot Help needed moving to and from a bed to a chair (including a wheelchair)?: A Little Help needed standing up from a chair using your arms (e.g., wheelchair or bedside chair)?: A Little Help needed to walk in hospital room?: A Lot Help needed climbing 3-5 steps with a railing? : Total 6 Click Score: 13    End of Session Equipment Utilized During Treatment: Gait  belt Activity Tolerance: Patient tolerated treatment well Patient left: in bed;with call bell/phone within reach;with family/visitor present;with bed alarm set (seated up EOB) Nurse Communication: Mobility status PT Visit Diagnosis: Other abnormalities of gait and mobility (R26.89);Muscle weakness (generalized) (M62.81);Other symptoms and signs involving the nervous system (R29.898)     Time: 1445-1511 PT Time Calculation (min) (ACUTE ONLY): 26 min  Charges:    $Gait Training: 8-22 mins $Therapeutic Activity: 8-22 mins PT General Charges $$ ACUTE PT VISIT: 1 Visit                     Adilenne Ashworth R. PTA Acute Rehabilitation Services Office: [phone]   Catalina Antigua 02/24/2023, 3:20 PM

## 2023-02-24 NOTE — Progress Notes (Signed)
Nutrition Follow-up  DOCUMENTATION CODES:  Severe malnutrition in context of social or environmental circumstances  INTERVENTION:  Transition to nocturnal enteral nutrition via Cortrak: Osmolite 1.5 at 83 ml/hr x 12 hours from 1800 to 0600 (1000 ml per day) 60 ml ProSource TF20 BID Free water flush: 200 mL q4h  Provides: 1660 kcal, 103 grams protein, and total free water: 1962 ml. Continue MVI, folic acid, thiamine Ensure Enlive po BID, each supplement provides 350 kcal and 20 grams of protein.  NUTRITION DIAGNOSIS:  Severe Malnutrition related to social / environmental circumstances as evidenced by severe fat depletion, severe muscle depletion. Ongoing, being addressed by TF and PO intake   GOAL:  Patient will meet greater than or equal to 90% of their needs Met with TF at goal   MONITOR:  TF tolerance  REASON FOR ASSESSMENT:  Consult Enteral/tube feeding initiation and management  ASSESSMENT:  Pt with PMH of sickle cell, chronic pain, retinal hemorrhage, tobacco use, MJ use, vitamin D deficiency, and GERD admitted with acute encephalopathy, found wandering [place] altered. Pt intubated.  10/29 - admitted with AMS 10/30 - intubated, LP attempted at bedside and in IR, unsuccessful 11/3 - Repeat MRI shows scattered new small acute infarcts bilateral cerebral and cerebellar  11/4 - LP in IR, results negative  11/5 - TEE, noted presence of R to L shunt, stroke etiology likely paradoxical embolism  11/6 - extubated; cortrak tube placed (tip in distal stomach) 11/7 - diet advanced by SLP to Dysphagia 2 with thin liquids; transferred to floor 11/8 - SLP recommend NPO 2/2 change in mentation 11/9 - MBS, recommend full liquid diet  11/11 - diet advanced by SLP to Dysphagia 3, thin liquids   Pt laying in bed, family at bedside. Reports that he ate all of his grits this morning and is able to feed self. Denies any nausea or vomiting. Discussed transitioning to nocturnal TF and  continue to work on PO intake, pt agreeable. Pt inquiring about getting up and walking, RD discussed that hopefully PT will be around to help him get up.   Discussed with MD, MD ok with liberalizing to regular full liquid diet. Also discussed transitioning to nocturnal feeds.   Medications reviewed and include: Buspar, Folic Acid, SSI, MVI with minerals, Thiamine 100 mg daily, IV Vancomycin   Labs reviewed. CBG: 124-150 x 24 hours   Current weight: 79.9 kg Admission weight: 82.6 kg  Diet Order:   Diet Order             DIET DYS 3 Fluid consistency: Thin  Diet effective now                  EDUCATION NEEDS:  Not appropriate for education at this time  Skin:  Skin Assessment: Reviewed RN Assessment  Last BM:  11/10 via FMS (550 mL)  Height:  Ht Readings from Last 1 Encounters:  02/15/23 6' 0.01" (1.829 m)   Weight:  Wt Readings from Last 1 Encounters:  02/24/23 79.9 kg   Ideal Body Weight:  80.9 kg  BMI:  Body mass index is 23.88 kg/m.  Estimated Nutritional Needs:  Kcal:  2100-2400 Protein:  120-140 grams Fluid:  >2 L/day   Kirby Crigler RD, LDN Clinical Dietitian

## 2023-02-24 NOTE — Plan of Care (Signed)
  Problem: Education: Goal: Ability to describe self-care measures that may prevent or decrease complications (Diabetes Survival Skills Education) will improve Outcome: Progressing Goal: Individualized Educational Video(s) Outcome: Progressing   Problem: Coping: Goal: Ability to adjust to condition or change in health will improve Outcome: Progressing   Problem: Fluid Volume: Goal: Ability to maintain a balanced intake and output will improve Outcome: Progressing   Problem: Health Behavior/Discharge Planning: Goal: Ability to identify and utilize available resources and services will improve Outcome: Progressing Goal: Ability to manage health-related needs will improve Outcome: Progressing   Problem: Metabolic: Goal: Ability to maintain appropriate glucose levels will improve Outcome: Progressing   Problem: Nutritional: Goal: Maintenance of adequate nutrition will improve Outcome: Progressing Goal: Progress toward achieving an optimal weight will improve Outcome: Progressing   Problem: Skin Integrity: Goal: Risk for impaired skin integrity will decrease Outcome: Progressing   Problem: Tissue Perfusion: Goal: Adequacy of tissue perfusion will improve Outcome: Progressing   Problem: Safety: Goal: Non-violent Restraint(s) Outcome: Progressing   Problem: Education: Goal: Knowledge of General Education information will improve Description: Including pain rating scale, medication(s)/side effects and non-pharmacologic comfort measures Outcome: Progressing   Problem: Health Behavior/Discharge Planning: Goal: Ability to manage health-related needs will improve Outcome: Progressing   Problem: Clinical Measurements: Goal: Ability to maintain clinical measurements within normal limits will improve Outcome: Progressing Goal: Will remain free from infection Outcome: Progressing Goal: Diagnostic test results will improve Outcome: Progressing Goal: Respiratory complications  will improve Outcome: Progressing Goal: Cardiovascular complication will be avoided Outcome: Progressing   Problem: Activity: Goal: Risk for activity intolerance will decrease Outcome: Progressing   Problem: Nutrition: Goal: Adequate nutrition will be maintained Outcome: Progressing   Problem: Coping: Goal: Level of anxiety will decrease Outcome: Progressing   Problem: Elimination: Goal: Will not experience complications related to bowel motility Outcome: Progressing Goal: Will not experience complications related to urinary retention Outcome: Progressing   Problem: Pain Management: Goal: General experience of comfort will improve Outcome: Progressing   Problem: Safety: Goal: Ability to remain free from injury will improve Outcome: Progressing   Problem: Skin Integrity: Goal: Risk for impaired skin integrity will decrease Outcome: Progressing   Problem: Education: Goal: Knowledge of disease or condition will improve Outcome: Progressing Goal: Knowledge of secondary prevention will improve (MUST DOCUMENT ALL) Outcome: Progressing Goal: Knowledge of patient specific risk factors will improve [name] N/A or DELETE if not current risk factor) Outcome: Progressing   Problem: Ischemic Stroke/TIA Tissue Perfusion: Goal: Complications of ischemic stroke/TIA will be minimized Outcome: Progressing   Problem: Coping: Goal: Will verbalize positive feelings about self Outcome: Progressing Goal: Will identify appropriate support needs Outcome: Progressing   Problem: Health Behavior/Discharge Planning: Goal: Ability to manage health-related needs will improve Outcome: Progressing Goal: Goals will be collaboratively established with patient/family Outcome: Progressing   Problem: Self-Care: Goal: Ability to participate in self-care as condition permits will improve Outcome: Progressing Goal: Verbalization of feelings and concerns over difficulty with self-care will  improve Outcome: Progressing Goal: Ability to communicate needs accurately will improve Outcome: Progressing   Problem: Nutrition: Goal: Risk of aspiration will decrease Outcome: Progressing Goal: Dietary intake will improve Outcome: Progressing

## 2023-02-24 NOTE — TOC Benefit Eligibility Note (Signed)
Patient Product/process development scientist completed.    The patient is insured through [employer] and [facility] MEDICAID.     Ran test claim for Eliquis 5 mg and the current 30 day co-pay is $4.00.   This test claim was processed through [facility]- copay amounts may vary at other pharmacies due to pharmacy/plan contracts, or as the patient moves through the different stages of their insurance plan.     Roland Earl, CPHT Pharmacy Technician III Certified Patient Advocate [facility] Patient Advocate Team Direct Number: [phone]  Fax: [phone]

## 2023-02-24 NOTE — Discharge Instructions (Addendum)
Information on my medicine - ELIQUIS (apixaban)  This medication education was reviewed with me or my healthcare representative as part of my discharge preparation.    Why was Eliquis prescribed for you? Eliquis was prescribed to treat blood clots that may have been found in the veins of your legs (deep vein thrombosis) or in your lungs (pulmonary embolism) and to reduce the risk of them occurring again.  What do You need to know about Eliquis ? The dose is ONE 5 mg tablet taken TWICE daily.  Eliquis may be taken with or without food.   Try to take the dose about the same time in the morning and in the evening. If you have difficulty swallowing the tablet whole please discuss with your pharmacist how to take the medication safely.  Take Eliquis exactly as prescribed and DO NOT stop taking Eliquis without talking to the doctor who prescribed the medication.  Stopping may increase your risk of developing a new blood clot.  Refill your prescription before you run out.  After discharge, you should have regular check-up appointments with your healthcare provider that is prescribing your Eliquis.    What do you do if you miss a dose? If a dose of ELIQUIS is not taken at the scheduled time, take it as soon as possible on the same day and twice-daily administration should be resumed. The dose should not be doubled to make up for a missed dose.  Important Safety Information A possible side effect of Eliquis is bleeding. You should call your healthcare provider right away if you experience any of the following: Bleeding from an injury or your nose that does not stop. Unusual colored urine (red or dark brown) or unusual colored stools (red or black). Unusual bruising for unknown reasons. A serious fall or if you hit your head (even if there is no bleeding).  Some medicines may interact with Eliquis and might increase your risk of bleeding or clotting while on Eliquis. To help avoid this,  consult your healthcare provider or pharmacist prior to using any new prescription or non-prescription medications, including herbals, vitamins, non-steroidal anti-inflammatory drugs (NSAIDs) and supplements.  This website has more information on Eliquis (apixaban): http://www.eliquis.com/eliquis/home  ============================== Deep Vein Thrombosis    Deep vein thrombosis (DVT) is a condition in which a blood clot forms in a deep vein, such as a lower leg, thigh, or arm vein. A clot is blood that has thickened into a gel or solid. This condition is dangerous. It can lead to serious and even life-threatening complications if the clot travels to the lungs and causes a blockage (pulmonary embolism). It can also damage veins in the leg. This can result in leg pain, swelling, discoloration, and sores (post-thrombotic syndrome).  What are the causes? This condition may be caused by: A slowdown of blood flow. Damage to a vein. A condition that causes blood to clot more easily, such as an inherited clotting disorder.  What increases the risk? The following factors may make you more likely to develop this condition: Being overweight. Being older, especially over age 71. Sitting or lying down for more than four hours. Being in the hospital. Lack of physical activity (sedentary lifestyle). Pregnancy, being in childbirth, or having recently given birth. Taking medicines that contain estrogen, such as medicines to prevent pregnancy. Smoking. A history of any of the following: Blood clots or a blood clotting disease. Peripheral vascular disease. Inflammatory bowel disease. Cancer. Heart disease. Genetic conditions that affect how your blood  clots, such as Factor V Leiden mutation. Neurological diseases that affect your legs (leg paresis). A recent injury, such as a car accident. Major or lengthy surgery. A central line placed inside a large vein.  What are the signs or  symptoms? Symptoms of this condition include: Swelling, pain, or tenderness in an arm or leg. Warmth, redness, or discoloration in an arm or leg. If the clot is in your leg, symptoms may be more noticeable or worse when you stand or walk. Some people may not develop any symptoms.  How is this diagnosed? This condition is diagnosed with: A medical history and physical exam. Tests, such as: Blood tests. These are done to check how well your blood clots. Ultrasound. This is done to check for clots. Venogram. For this test, contrast dye is injected into a vein and X-rays are taken to check for any clots  How is this treated? Treatment for this condition depends on: The cause of your DVT. Your risk for bleeding or developing more clots. Any other medical conditions that you have. Treatment may include: Taking a blood thinner (anticoagulant). This type of medicine prevents clots from forming. It may be taken by mouth, injected under the skin, or injected through an IV (catheter). Injecting clot-dissolving medicines into the affected vein (catheter-directed thrombolysis). Having surgery. Surgery may be done to: Remove the clot. Place a filter in a large vein to catch blood clots before they reach the lungs. Some treatments may be continued for up to six months.  Follow these instructions at home: If you are taking blood thinners: Take the medicine exactly as told by your health care provider. Some blood thinners need to be taken at the same time every day. Do not skip a dose. Talk with your health care provider before you take any medicines that contain aspirin or NSAIDs. These medicines increase your risk for dangerous bleeding. Ask your health care provider about foods and drugs that could change the way the medicine works (may interact). Avoid those things if your health care provider tells you to do so. Blood thinners can cause easy bruising and may make it difficult to stop bleeding.  Because of this: Be very careful when using knives, scissors, or other sharp objects. Use an electric razor instead of a blade. Avoid activities that could cause injury or bruising, and follow instructions about how to prevent falls. Wear a medical alert bracelet or carry a card that lists what medicines you take.  General instructions Take over-the-counter and prescription medicines only as told by your health care provider. Return to your normal activities as told by your health care provider. Ask your health care provider what activities are safe for you. Wear compression stockings if recommended by your health care provider. Keep all follow-up visits as told by your health care provider. This is important.  How is this prevented? To lower your risk of developing this condition again: For 30 or more minutes every day, do an activity that: Involves moving your arms and legs. Increases your heart rate. When traveling for longer than four hours: Exercise your arms and legs every hour. Drink plenty of water. Avoid drinking alcohol. Avoid sitting or lying for a long time without moving your legs. If you have surgery or you are hospitalized, ask about ways to prevent blood clots. These may include taking frequent walks or using anticoagulants. Stay at a healthy weight. If you are a woman who is older than age 79, avoid unnecessary use of medicines  that contain estrogen, such as some birth control pills. Do not use any products that contain nicotine or tobacco, such as cigarettes and e-cigarettes. This is especially important if you take estrogen medicines. If you need help quitting, ask your health care provider.  Contact a health care provider if: You miss a dose of your blood thinner. Your menstrual period is heavier than usual. You have unusual bruising.  Get help right away if: You have: New or increased pain, swelling, or redness in an arm or leg. Numbness or tingling in an arm  or leg. Shortness of breath. Chest pain. A rapid or irregular heartbeat. A severe headache or confusion. A cut that will not stop bleeding. There is blood in your vomit, stool, or urine. You have a serious fall or accident, or you hit your head. You feel light-headed or dizzy. You cough up blood.  These symptoms may represent a serious problem that is an emergency. Do not wait to see if the symptoms will go away. Get medical help right away. Call your local emergency services (911 in the [place] Do not drive yourself to the hospital. Summary Deep vein thrombosis (DVT) is a condition in which a blood clot forms in a deep vein, such as a lower leg, thigh, or arm vein. Symptoms can include swelling, warmth, pain, and redness in your leg or arm. This condition may be treated with a blood thinner (anticoagulant medicine), medicine that is injected to dissolve blood clots,compression stockings, or surgery. If you are prescribed blood thinners, take them exactly as told. This information is not intended to replace advice given to you by your health care provider. Make sure you discuss any questions you have with your health care provider. Document Revised: 03/14/2017 Document Reviewed: 08/30/2016 Elsevier Patient Education  [place].

## 2023-02-24 NOTE — Progress Notes (Signed)
Speech Language Pathology Treatment: Dysphagia  Patient Details Name: Peter Kline MRN: [id number] DOB: 1966-02-10 Today's Date: 02/24/2023 Time: 7829-5621 SLP Time Calculation (min) (ACUTE ONLY): 10 min  Assessment / Plan / Recommendation Clinical Impression  Pt seen for clinical swallow re-assessment to determine diet advancement as indicated.  Per discussion with RN, pt is tolerating a full liquids, thin liquids diet without overt s/s of aspiration. Pt's alertness has improved compared to prior SLP session.  He is now on room air and WBC is trending down. Pt is currently afebrile.   Pt accepted trials of diced peaches (Dys 3 consistency) totaling 4 oz and thin liquids via straw totaling 7-8 oz.  Oral prep and transit observed to be prompt with complete oral clearance with these consistencies. No anterior loss noted. Pharyngeal swallow initiation appeared prompt with laryngeal elevation noted.  Occasional immediate and delayed throat clearing observed with PO trials.  Pt's vocal quality remained clear and overt coughing episodes were observed.   Recommend diet advancement to a mechanical soft (dysphagia 3 consistency) with continuation of thin liquids.  Recommend PO meds as tolerated.   Plan: SLP will follow up to assess diet tolerance and advance diet further as indicated. Plan for speech/language/cognitive assessment in upcoming session.    HPI HPI: Peter Kline is a 57 year old male with presented to the emergency department on 10/30 with altered mental status. Was reportedly found wandering streets of [place] altered.  Pt with respiratory failure and intubated 10/30-11/6.  MRI 10/31 negative.  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 Pt with past medical history of sickle cell c/b priapism, GERD, chronic  pain, retinal hemorrhage, tobacco use, marijuana use, vitamin D deficiency      SLP Plan  Continue with current plan of care      Recommendations for follow up therapy are one component of a multi-disciplinary discharge planning process, led by the attending physician.  Recommendations may be updated based on patient status, additional functional criteria and insurance authorization.    Recommendations  Diet recommendations: Dysphagia 3 (mechanical soft);Thin liquid Liquids provided via: Straw Medication Administration: Whole meds with liquid Supervision: Full supervision/cueing for compensatory strategies;Trained caregiver to feed patient Compensations: Minimize environmental distractions;Slow rate;Small sips/bites Postural Changes and/or Swallow Maneuvers: Seated upright 90 degrees                  Oral care BID   Frequent or constant Supervision/Assistance Dysphagia, oropharyngeal phase (R13.12)     Continue with current plan of care     Ellery Plunk  02/24/2023, 12:16 PM

## 2023-02-24 NOTE — Progress Notes (Signed)
Inpatient Rehab Admissions Coordinator:  ? ?Per therapy recommendations,  patient was screened for CIR candidacy by Devaney Segers, MS, CCC-SLP. At this time, Pt. Appears to be a a potential candidate for CIR. I will place   order for rehab consult per protocol for full assessment. Please contact me any with questions. ? ?Trine Fread, MS, CCC-SLP ?Rehab Admissions Coordinator  ?[phone] (celll) ?[phone] (office) ? ?

## 2023-02-25 DIAGNOSIS — J9601 Acute respiratory failure with hypoxia: Secondary | ICD-10-CM | POA: Diagnosis not present

## 2023-02-25 DIAGNOSIS — I6300 Cerebral infarction due to thrombosis of unspecified precerebral artery: Secondary | ICD-10-CM | POA: Diagnosis not present

## 2023-02-25 DIAGNOSIS — E43 Unspecified severe protein-calorie malnutrition: Secondary | ICD-10-CM | POA: Diagnosis not present

## 2023-02-25 DIAGNOSIS — J189 Pneumonia, unspecified organism: Secondary | ICD-10-CM | POA: Diagnosis not present

## 2023-02-25 LAB — CBC
HCT: 24.2 % — ABNORMAL LOW (ref 39.0–52.0)
Hemoglobin: 8.7 g/dL — ABNORMAL LOW (ref 13.0–17.0)
MCH: 26.7 pg (ref 26.0–34.0)
MCHC: 36 g/dL (ref 30.0–36.0)
MCV: 74.2 fL — ABNORMAL LOW (ref 80.0–100.0)
Platelets: 467 10*3/uL — ABNORMAL HIGH (ref 150–400)
RBC: 3.26 MIL/uL — ABNORMAL LOW (ref 4.22–5.81)
RDW: 15.9 % — ABNORMAL HIGH (ref 11.5–15.5)
WBC: 22.8 10*3/uL — ABNORMAL HIGH (ref 4.0–10.5)
nRBC: 2.2 % — ABNORMAL HIGH (ref 0.0–0.2)

## 2023-02-25 LAB — BASIC METABOLIC PANEL
Anion gap: 10 (ref 5–15)
BUN: 17 mg/dL (ref 6–20)
CO2: 22 mmol/L (ref 22–32)
Calcium: 8.2 mg/dL — ABNORMAL LOW (ref 8.9–10.3)
Chloride: 106 mmol/L (ref 98–111)
Creatinine, Ser: 0.77 mg/dL (ref 0.61–1.24)
GFR, Estimated: >60 mL/min (ref >60)
Glucose, Bld: 129 mg/dL — ABNORMAL HIGH (ref 70–99)
Potassium: 3.9 mmol/L (ref 3.5–5.1)
Sodium: 138 mmol/L (ref 135–145)

## 2023-02-25 LAB — GLUCOSE, CAPILLARY
Glucose-Capillary: 130 mg/dL — ABNORMAL HIGH (ref 70–99)
Glucose-Capillary: 109 mg/dL — ABNORMAL HIGH (ref 70–99)
Glucose-Capillary: 119 mg/dL — ABNORMAL HIGH (ref 70–99)
Glucose-Capillary: 100 mg/dL — ABNORMAL HIGH (ref 70–99)
Glucose-Capillary: 116 mg/dL — ABNORMAL HIGH (ref 70–99)

## 2023-02-25 NOTE — Plan of Care (Signed)

## 2023-02-25 NOTE — Progress Notes (Signed)
PROGRESS NOTE    Peter Kline  ZOX:[id number] DOB: January 11, 1966 DOA: 02/11/2023 PCP: Quentin Angst, MD   Brief Narrative: Peter Kline is a 57 y.o. male with a history of sickle cell c disease with priapism, GERD, chronic pain, retinal hemorrhage, tobacco use, marijuana use, vitamin d deficiency.  Patient presented secondary to altered mental status and required ICU admission for airway protection. Workup significant for acute strokes, acute DVT, aspiration pneumonia with hospitalization complicated by sepsis. Workup negative for CNS infection. Patient improved enough for extubation and transfer to the floor, with persistent fevers.   Assessment and Plan:  Acute metabolic encephalopathy Acute toxic encephalopathy Multifactorial in setting of polysubstance abuse and strokes, in addition to likely aspiration pneumonia. Infectious workup negative for CNS infection. Patient continues to get closer to baseline.  Acute embolic CVA MRI on 10/31 confirmed a subcentimeter acute infarct in the left occipital white matter in addition to MRI on 11/3 significant for scattered small new acute infarcts in the bilateral cerebral and cerebellar hemispheres. CTA head and neck negative for large vessel occlusion or other emergent finding. LDL of 49. Hemoglobin A1C of 4.8%. Transthoracic Echocardiogram significant for normal LVEF of 55-60% with grade 2 diastolic dysfunction and no atrial level shunt. Transesophageal Echocardiogram significant for positive bubble study suggesting PFO. Neurology recommendations for Heparin drip with transition to Eliquis when able. PT/OT recommendations for likely SNF pending cognitive improvement. Heparin discontinued -Continue Eliquis  Aspiration pneumonia Patient has been treated with antibiotics. Tracheal aspirate significant for staphylococcus epidermidis; MRSA PCR negative. Persistent fevers. Vancomycin added. CT imaging confirms bilateral lower lobe multifocal  pneumonia. -Continue Vancomycin and Zosyn for several days from improvement/resolution of fevers and leukocytosis -Will consult ID for recommendations on antibiotic duration  Sepsis Present on admission. Secondary to pneumonia and possible other etiology. CNS workup negative. Blood cultures (11/3) no growth. Patient with persistent fevers and leukocytosis. CT chest/abdomen/pelvis confirms bilateral lower lobe pneumonia with evidence of secretions in distal trachea/left mainstem bronchus. Urine culture (11/8) with no growth. Fevers curve appear to be finally improving, although worsened leukocytosis. CBC now trending downward. -Follow-up blood cultures -Follow-up CBC  Acute bilateral lower extremity DVT Acute DVT involving the left posterior tibial veins and left peroneal veins. Age indeterminate DVT involving the right posterior tibial veins and right peroneal veins. Patient managed on heparin and transitioned to Eliquis. -Continue Eliquis PO  Dysuria Urine culture with no growth. Symptoms are improved.  Hypernatremia Peak sodium of 150. Secondary to insensible losses and dehydration. Patient is NPO and has an NG tube. Patient started on increased free water. Sodium improved with IV fluids  Acute respiratory failure with hypoxia Secondary to altered mental status. Patient was intubated on 10/30 and managed via mechanical ventilation. Patient extubated on 11/6. Patient down to room air.  Sickle cell C disease Noted. Patient is on folic acid as an outpatient. -Continue folic acid  GERD Noted.  Dysphagia NG tube placed. SLP evaluated and have advanced patient to a dysphagia 3 diet. NG tube feeds transitioned to nocturnal -Continue NG tube and tube feeds and discontinue if patient continues to tolerate oral nutrition well   DVT prophylaxis: Eliquis Code Status:   Code Status: Full Code Family Communication: Wife at bedside Disposition Plan: Discharge likely to SNF pending improvement  in fevers and mental status in addition to transition to an outpatient antibiotic regimen and eventual removal of NG tube   Consultants:  PCCM Neurology  Procedures:  Intubation/extubation EEG Transthoracic Echocardiogram Transesophageal Echocardiogram  Lumbar puncture Small bore feeding tube insertion  Antimicrobials: Vancomycin Zosyn   Subjective: No concerns from overnight.  Objective: BP (!) 152/88 (BP Location: Left Arm)   Pulse 100   Temp 100.2 F (37.9 C) (Oral)   Resp 19   Ht 6' 0.01" (1.829 m)   Wt 79.9 kg   SpO2 97%   BMI 23.88 kg/m   Examination:  General exam: Appears calm and comfortable Respiratory system: Clear to auscultation. Respiratory effort normal. Cardiovascular system: S1 & S2 heard, RRR. No murmurs. Gastrointestinal system: Abdomen is nondistended, soft and nontender.  Normal bowel sounds heard. Central nervous system: Alert and oriented. Musculoskeletal: No edema. No calf tenderness   Data Reviewed: I have personally reviewed following labs and imaging studies  CBC Lab Results  Component Value Date   WBC 22.8 (H) 02/25/2023   RBC 3.26 (L) 02/25/2023   HGB 8.7 (L) 02/25/2023   HCT 24.2 (L) 02/25/2023   MCV 74.2 (L) 02/25/2023   MCH 26.7 02/25/2023   PLT 467 (H) 02/25/2023   MCHC 36.0 02/25/2023   RDW 15.9 (H) 02/25/2023   LYMPHSABS 3.2 02/21/2023   MONOABS 0.8 02/21/2023   EOSABS 0.6 (H) 02/21/2023   BASOSABS 0.2 (H) 02/21/2023     Last metabolic panel Lab Results  Component Value Date   NA 138 02/25/2023   K 3.9 02/25/2023   CL 106 02/25/2023   CO2 22 02/25/2023   BUN 17 02/25/2023   CREATININE 0.77 02/25/2023   GLUCOSE 129 (H) 02/25/2023   GFRNONAA >60 02/25/2023   GFRAA 110 11/25/2018   CALCIUM 8.2 (L) 02/25/2023   PHOS 2.0 (L) 02/20/2023   PROT 7.1 02/17/2023   ALBUMIN 2.6 (L) 02/17/2023   LABGLOB 3.0 11/25/2018   AGRATIO 1.5 11/25/2018   BILITOT 1.0 02/17/2023   ALKPHOS 123 02/17/2023   AST 35 02/17/2023    ALT 47 (H) 02/17/2023   ANIONGAP 10 02/25/2023    GFR: Estimated Creatinine Clearance: 110.5 mL/min (by C-G formula based on SCr of 0.77 mg/dL).  Recent Results (from the past 240 hour(s))  Culture, blood (Routine X 2) w Reflex to ID Panel     Status: None   Collection Time: 02/16/23  8:03 PM   Specimen: BLOOD LEFT HAND  Result Value Ref Range Status   Specimen Description BLOOD LEFT HAND  Final   Special Requests   Final    BOTTLES DRAWN AEROBIC AND ANAEROBIC Blood Culture results may not be optimal due to an excessive volume of blood received in culture bottles   Culture   Final    NO GROWTH 5 DAYS Performed at [facility], [address], [place], [place] [address]    Report Status 02/21/2023 FINAL  Final  Culture, blood (Routine X 2) w Reflex to ID Panel     Status: None   Collection Time: 02/16/23  8:09 PM   Specimen: BLOOD LEFT ARM  Result Value Ref Range Status   Specimen Description BLOOD LEFT ARM  Final   Special Requests   Final    BOTTLES DRAWN AEROBIC AND ANAEROBIC Blood Culture results may not be optimal due to an excessive volume of blood received in culture bottles   Culture   Final    NO GROWTH 5 DAYS Performed at [facility], [address], [place], [place] [address]    Report Status 02/21/2023 FINAL  Final  CSF culture w Gram Stain     Status: None   Collection Time:  02/17/23  2:35 PM   Specimen: PATH Cytology CSF; Cerebrospinal Fluid  Result Value Ref Range Status   Specimen Description CSF  Final   Special Requests NONE  Final   Gram Stain NO WBC SEEN NO ORGANISMS SEEN CYTOSPIN SMEAR   Final   Culture   Final    NO GROWTH 3 DAYS Performed at [facility], [address], [place], [place] [address]    Report Status 02/21/2023 FINAL  Final  Surgical PCR screen     Status: Abnormal   Collection Time: 02/17/23  7:47 PM   Specimen: Nasal Mucosa; Nasal Swab  Result Value Ref Range Status   MRSA, PCR (A) NEGATIVE Final     INVALID, UNABLE TO DETERMINE THE PRESENCE OF TARGET DUE TO SPECIMEN INTEGRITY. RECOLLECTION REQUESTED.    Comment: RESULT CALLED TO, READ BACK BY AND VERIFIED WITH: RN C. RUECKEL [id number] @2201  FH    Staphylococcus aureus (A) NEGATIVE Final    INVALID, UNABLE TO DETERMINE THE PRESENCE OF TARGET DUE TO SPECIMEN INTEGRITY. RECOLLECTION REQUESTED.    Comment: Performed at [facility], [address], [place], [place] [address]  Surgical PCR screen     Status: None   Collection Time: 02/17/23 10:10 PM   Specimen: Nasal Mucosa; Nasal Swab  Result Value Ref Range Status   MRSA, PCR NEGATIVE NEGATIVE Final   Staphylococcus aureus NEGATIVE NEGATIVE Final    Comment: (NOTE) The Xpert SA Assay (FDA approved for NASAL specimens in patients 8 years of age and older), is one component of a comprehensive surveillance program. It is not intended to diagnose infection nor to guide or monitor treatment. Performed at [facility], [address], [place], [place] [address]   Culture, Respiratory w Gram Stain     Status: None   Collection Time: 02/18/23  7:09 AM   Specimen: Tracheal Aspirate; Respiratory  Result Value Ref Range Status   Specimen Description TRACHEAL ASPIRATE  Final   Special Requests NONE  Final   Gram Stain   Final    RARE WBC PRESENT,BOTH PMN AND MONONUCLEAR NO ORGANISMS SEEN Performed at [facility], [address], [place], [place] [address]    Culture MODERATE STAPHYLOCOCCUS EPIDERMIDIS  Final   Report Status 02/20/2023 FINAL  Final   Organism ID, Bacteria STAPHYLOCOCCUS EPIDERMIDIS  Final      Susceptibility   Staphylococcus epidermidis - MIC*    CIPROFLOXACIN >=8 RESISTANT Resistant     ERYTHROMYCIN >=8 RESISTANT Resistant     GENTAMICIN <=0.5 SENSITIVE Sensitive     OXACILLIN >=4 RESISTANT Resistant     TETRACYCLINE <=1 SENSITIVE Sensitive     VANCOMYCIN 2 SENSITIVE Sensitive     TRIMETH/SULFA <=10 SENSITIVE Sensitive     CLINDAMYCIN <=0.25  SENSITIVE Sensitive     RIFAMPIN <=0.5 SENSITIVE Sensitive     Inducible Clindamycin NEGATIVE Sensitive     * MODERATE STAPHYLOCOCCUS EPIDERMIDIS  Urine Culture     Status: None   Collection Time: 02/21/23  9:47 AM   Specimen: Urine, Clean Catch  Result Value Ref Range Status   Specimen Description URINE, CLEAN CATCH  Final   Special Requests NONE  Final   Culture   Final    NO GROWTH Performed at [facility], [address] [place], [place] [address]    Report Status 02/22/2023 FINAL  Final  Culture, blood (Routine X 2) w Reflex to ID Panel     Status: None (  Preliminary result)   Collection Time: 02/21/23  1:21 PM   Specimen: BLOOD RIGHT ARM  Result Value Ref Range Status   Specimen Description BLOOD RIGHT ARM  Final   Special Requests   Final    BOTTLES DRAWN AEROBIC AND ANAEROBIC Blood Culture results may not be optimal due to an inadequate volume of blood received in culture bottles   Culture   Final    NO GROWTH 4 DAYS Performed at [facility], [address] [place], [place] [address]    Report Status PENDING  Incomplete  Culture, blood (Routine X 2) w Reflex to ID Panel     Status: None (Preliminary result)   Collection Time: 02/21/23  1:24 PM   Specimen: BLOOD RIGHT WRIST  Result Value Ref Range Status   Specimen Description BLOOD RIGHT WRIST  Final   Special Requests   Final    BOTTLES DRAWN AEROBIC AND ANAEROBIC Blood Culture results may not be optimal due to an inadequate volume of blood received in culture bottles   Culture   Final    NO GROWTH 4 DAYS Performed at [facility], [address], [place], [place] [address]    Report Status PENDING  Incomplete      Radiology Studies: No results found.    LOS: 13 days    Jacquelin Hawking, MD [facility] 02/25/2023, 9:23 AM   If 7PM-7AM, please contact night-coverage [email or web address]

## 2023-02-25 NOTE — Progress Notes (Signed)
Occupational Therapy Treatment Patient Details Name: Peter Kline MRN: [id number] DOB: June 20, 1965 Today's Date: 02/25/2023   History of present illness Pt is a 57 y/o male presenting to the ED on 10/30 with AMS, found wandering the streets of [facility] confused with slurred speech.  Initially negative for stroke.  Mental status failed to improve, pt became more agitated and eventually was intubated for airway protection.  Chest xray showed multifocal PNA.  11/1 had small aspiration event that led to further agitation and further aspiration, 11/3 repeat MRI showed scattered new infarct in bil cerebrum and cerebellum, 11/5  bil LE DVT+  PMHx:  sickle cell anemia, lap repair of gastric ulcer.   OT comments  Pt progressing towards goals this session, continues to present with decr cognition and safety awareness, pt needing min-max A for ADLs, min A for bed mobility and mod A for transfers with RW. Pt continually lifting RW up despite cues needing up to mod +2 at times for mobility. Pt able to perform standing grooming task at sink needing step by step cues to perform task. HR up to 135bpm with 2/4 notable DOE on 2L O2 during session. Pt presenting with impairments listed below, will follow acutely. Patient will benefit from intensive inpatient follow up therapy, >3 hours/day to maximize safety/ind with ADLs/functional mobility.       If plan is discharge home, recommend the following:  Two people to help with walking and/or transfers;Assist for transportation;Direct supervision/assist for financial management;Direct supervision/assist for medications management;Help with stairs or ramp for entrance;Assistance with cooking/housework;A lot of help with bathing/dressing/bathroom   Equipment Recommendations  Other (comment) (defer)    Recommendations for Other Services PT consult;Rehab consult    Precautions / Restrictions Precautions Precautions: Fall Restrictions Weight Bearing Restrictions:  No       Mobility Bed Mobility Overal bed mobility: Needs Assistance Bed Mobility: Supine to Sit Rolling: Min assist   Supine to sit: Min assist Sit to supine: Min assist        Transfers Overall transfer level: Needs assistance Equipment used: Rolling walker (2 wheels) Transfers: Sit to/from Stand Sit to Stand: Mod assist                 Balance Overall balance assessment: Needs assistance Sitting-balance support: Bilateral upper extremity supported, Single extremity supported, No upper extremity supported, Feet supported Sitting balance-Leahy Scale: Fair Sitting balance - Comments: able to maintain without UE support   Standing balance support: Bilateral upper extremity supported Standing balance-Leahy Scale: Poor Standing balance comment: reliant on UE support and external assist                           ADL either performed or assessed with clinical judgement   ADL Overall ADL's : Needs assistance/impaired Eating/Feeding: Minimal assistance   Grooming: Moderate assistance   Upper Body Bathing: Moderate assistance   Lower Body Bathing: Moderate assistance   Upper Body Dressing : Moderate assistance   Lower Body Dressing: Moderate assistance   Toilet Transfer: Moderate assistance;Ambulation;Rolling walker (2 wheels);Regular Toilet   Toileting- Clothing Manipulation and Hygiene: Maximal assistance       Functional mobility during ADLs: Moderate assistance;Rolling walker (2 wheels)      Extremity/Trunk Assessment Upper Extremity Assessment Upper Extremity Assessment: Generalized weakness   Lower Extremity Assessment Lower Extremity Assessment: Defer to PT evaluation        Vision   Additional Comments: will further assess   Perception Perception  Perception: Not tested   Praxis Praxis Praxis: Not tested    Cognition Arousal: Alert Behavior During Therapy: Impulsive Overall Cognitive Status: Impaired/Different from  baseline Area of Impairment: Orientation, Attention, Following commands, Safety/judgement, Awareness, Problem solving                   Current Attention Level: Focused   Following Commands: Follows one step commands with increased time Safety/Judgement: Decreased awareness of safety, Decreased awareness of deficits Awareness: Emergent Problem Solving: Slow processing, Decreased initiation General Comments: impulsive with movement, needs cues for safety as pt frequently trying to hold RW in the air while walking        Exercises      Shoulder Instructions       General Comments HR up to 135bpm with  2/4 DOE but denies SOB, SpO2 low - high 80's on 2L O2 with poor wave pleth, unable to obtain good wave pleth    Pertinent Vitals/ Pain       Pain Assessment Pain Assessment: No/denies pain  Home Living Family/patient expects to be discharged to:: Unsure Living Arrangements: Spouse/significant other                                      Prior Functioning/Environment              Frequency  Min 1X/week        Progress Toward Goals  OT Goals(current goals can now be found in the care plan section)  Progress towards OT goals: Progressing toward goals  ADL Goals Pt Will Perform Grooming: with supervision;sitting Additional ADL Goal #1: Pt will be min A to roll right and left in bed to A with basic ADLs Additional ADL Goal #2: Pt will follow 3 out of 5 one step commands Additional ADL Goal #3: Pt will be able to sit EOB without more than Min A for balance  Plan      Co-evaluation                 AM-PAC OT "6 Clicks" Daily Activity     Outcome Measure   Help from another person eating meals?: A Little Help from another person taking care of personal grooming?: A Little Help from another person toileting, which includes using toliet, bedpan, or urinal?: A Lot Help from another person bathing (including washing, rinsing, drying)?: A  Lot Help from another person to put on and taking off regular upper body clothing?: A Lot Help from another person to put on and taking off regular lower body clothing?: A Lot 6 Click Score: 14    End of Session Equipment Utilized During Treatment: Gait belt;Rolling walker (2 wheels)  OT Visit Diagnosis: Other abnormalities of gait and mobility (R26.89);Other symptoms and signs involving the nervous system (R29.898);Other symptoms and signs involving cognitive function;Pain   Activity Tolerance Patient tolerated treatment well   Patient Left in bed;with call bell/phone within reach;with bed alarm set   Nurse Communication Mobility status        Time: 1884-1660 OT Time Calculation (min): 21 min  Charges: OT General Charges $OT Visit: 1 Visit OT Treatments $Self Care/Home Management : 8-22 mins  [name], OTD, OTR/L SecureChat Preferred Acute Rehab [phone]    Satish Hammers K Koonce 02/25/2023, 11:03 AM

## 2023-02-25 NOTE — Progress Notes (Signed)
Inpatient Rehab Admissions Coordinator:    I met with pt. To discuss potential CIR admit. He and girlfriend stated interest. Girlfriend, stephanie, is disabled and cannot provide more than supervision at d/c. Aunt and uncle also cannot provide help. Judeth Cornfield states she will call kids and other family members.   Megan Salon, MS, CCC-SLP Rehab Admissions Coordinator  [phone] (celll) [phone] (office)

## 2023-02-25 NOTE — Progress Notes (Signed)
When I was giving the Buspar medication the wife stated that " Oh no he is really going to act crazy now" when I inquired what does she mean by this she said that he has nightmares and get crazy patient had already pulled out his flex seal from rectum and his primo because he was dreaming. I printed out information on the medication and stated that I would ask the MD to explain about this medication.

## 2023-02-25 NOTE — TOC Progression Note (Signed)
Transition of Care [facility]) - Progression Note    Patient Details  Name: Peter Kline MRN: [id number] Date of Birth: [birth year]  [facility]) CM/SW Contact  Kermit Balo, RN Phone Number: 02/25/2023, 3:24 PM  Clinical Narrative:     Recommendations have changed to CIR. They are working to see if he will have needed support after a rehab stay.  TOC following.  Expected Discharge Plan: IP Rehab Facility    Expected Discharge Plan and Services                                               Social Determinants of Health (SDOH) Interventions SDOH Screenings   Food Insecurity: No Food Insecurity (02/25/2023)  Housing: Patient Declined (02/25/2023)  Transportation Needs: No Transportation Needs (02/25/2023)  Utilities: Not At Risk (02/25/2023)  Depression (PHQ2-9): Low Risk  (11/25/2018)  Financial Resource Strain: Low Risk  (11/15/2021)   Received from [facility]  Tobacco Use: High Risk (02/11/2023)    Readmission Risk Interventions     No data to display

## 2023-02-25 NOTE — Progress Notes (Signed)
Speech Language Pathology Treatment: Dysphagia  Patient Details Name: Peter Kline MRN: [id number] DOB: 08-19-1965 Today's Date: 02/25/2023 Time: 7829-5621 SLP Time Calculation (min) (ACUTE ONLY): 11 min  Assessment / Plan / Recommendation Clinical Impression  Pt seen for ongoing dysphagia management.  Pt was awake/alert today.  Has had fair PO intake, no reports of adverse events.  Pt does not like current diet.  Wife thinks that advancing to regular texture diet may improve intake.  Today pt accept PO trials including large, serial straw sips of thin liquid, mechanical soft fruit fed to pt by spoon, and regular solid crackers which he self-fed.  There was some impulsivity noted with liquids and regular solids.  There was cough x1 with graham crackers and he was noted to take large bites.  He did achieve adequate oral clearance, and he is hopeful to have to feeding tube discontinue.  Recommend advancing diet texture to improve PO intake, but pt will likely need intermittent supervision to ensure use of general aspiration precautions (slow rate, small bites, upright positioning).  Recommend regular solid diet with thin liquids.     HPI HPI: [name] is a 57 year old male with presented to the emergency department on 10/30 with altered mental status. Was reportedly found wandering streets of [place] altered.  Pt with respiratory failure and intubated 10/30-11/6.  MRI 10/31 negative.  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 Pt with past medical history of sickle cell c/b priapism, GERD, chronic pain, retinal hemorrhage, tobacco use, marijuana use, vitamin D deficiency      SLP Plan  Continue with current plan of care      Recommendations for follow up therapy are one component of a multi-disciplinary  discharge planning process, led by the attending physician.  Recommendations may be updated based on patient status, additional functional criteria and insurance authorization.    Recommendations  Diet recommendations: Regular;Thin liquid Liquids provided via: Straw Medication Administration: Whole meds with liquid Supervision: Intermittent supervision to cue for compensatory strategies Compensations: Minimize environmental distractions;Slow rate;Small sips/bites Postural Changes and/or Swallow Maneuvers: Seated upright 90 degrees                  Oral care BID   Frequent or constant Supervision/Assistance Dysphagia, oropharyngeal phase (R13.12)     Continue with current plan of care     Kerrie Pleasure, MA, CCC-SLP Acute Rehabilitation Services Office: [phone] 02/25/2023, 12:12 PM

## 2023-02-25 NOTE — Evaluation (Signed)
Speech Language Pathology Evaluation Patient Details Name: Peter Kline MRN: [id number] DOB: April 11, 1966 Today's Date: 02/25/2023 Time: 5643-3295 SLP Time Calculation (min) (ACUTE ONLY): 26 min  Problem List:  Patient Active Problem List   Diagnosis Date Noted   Paradoxical embolism (HCC) 02/20/2023   Cerebrovascular accident (CVA) due to thrombosis of precerebral artery (HCC) 02/18/2023   AMS (altered mental status) 02/12/2023   Sepsis with encephalopathy without septic shock (HCC) 02/12/2023   Encephalopathy acute 02/12/2023   Acute respiratory failure with hypoxia (HCC) 02/12/2023   Community acquired pneumonia 02/12/2023   Protein-calorie malnutrition, severe 02/12/2023   Acute sickle cell crisis (HCC) 12/25/2020   Flail chest 12/25/2020   Left rib fracture 12/25/2020   Chronic prescription opiate use 06/18/2017   Vitamin D deficiency 05/03/2014   Gastroesophageal reflux disease without esophagitis 01/24/2014   Tobacco dependence 01/24/2014   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/12/2013   Elevated liver enzymes 10/01/2013   Fatigue 10/01/2013   Loss of weight 10/01/2013   Sickle cell anemia with pain (HCC) 08/26/2013   Lower urinary tract symptoms (LUTS) 03/09/2013   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 Was reportedly found wandering streets of [place] altered.  Pt with respiratory failure and intubated 10/30-11/6.  MRI 10/31 negative.  Repeat MRI 11/3: "Scattered small new acute infarcts in the bilateral cerebral and  cerebellar hemispheres, favored  embolic in etiology."  Admitted with multifocal pna, but most recent chest imaging 11/6 shows improvement. Pt with past medical history of sickle cell c/b priapism, GERD, chronic pain, retinal hemorrhage, tobacco use, marijuana use, vitamin D deficiency   Assessment / Plan / Recommendation Clinical Impression  Pt presents with moderate cognitive deficits across multiple domains.  Pt was assessed using the COGNISTAT (see below for additional information).  Pt's language ability is relatively spared.  Although pt exhibited moderated deficits on language comprehension task, this is suspected to be 2/2 attention and memory deficits rather than reflective of a true language impairment.  Pt's significant other reports that today's performance was consistent with baseline.  She states he has a very short attention span, she often has to repeat herself, and that pt will forget things while doing them (e.g. pt went to store to get item for himself, but forgot what he was shopping for, and that this may happen multiple times before he is able to complete task).  Today pt exhibited moderate impairments in orientation, attention, memory, calculations, and reasoning.  Visuospatial ability was assessed using a clock drawing (see image below) which reflected severe impairment. Pt made intentional marks, going over some areas multiple times to reinforce, but without organization or any recognizable items.  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  At present it seems like he is not able to resume that work. He is agreeable to therapy to address the above noted deficits, but if today's assessment is reflective of baseline status, progress may be limited.  Recommend ST in house and at next level of care.   COGNISTAT: All subtests are within the average range,  except where otherwise specified.  Orientation:  7/12, Mild-Moderate impairment Attention: 3/8, Moderate impairment Comprehension: 3/6, Moderate impairment (suspected to be 2/2  attention and memory deficits) Repetition: 10/12, Borderline impairment Naming: 7/8 Construction: clock drawing (see below) Memory: 4/12, Severe impairment Calculations: 1/4, Moderate impairment Similarities: 3/8, Moderate impairment Judgment: 2/6, Moderate impairment  Clock drawing:      SLP Assessment  SLP Recommendation/Assessment: Patient needs continued Speech Lanaguage Pathology Services SLP Visit Diagnosis: Cognitive communication deficit (R41.841)    Recommendations for follow up therapy are one component of a multi-disciplinary discharge planning process, led by the attending physician.  Recommendations may be updated based on patient status, additional functional criteria and insurance authorization.    Follow Up Recommendations  Follow physician's recommendations for discharge plan and follow up therapies    Assistance Recommended at Discharge  Frequent or constant Supervision/Assistance  Functional Status Assessment Patient has had a recent decline in their functional status and demonstrates the ability to make significant improvements in function in a reasonable and predictable amount of time.  Frequency and Duration min 2x/week  2 weeks      SLP Evaluation Cognition  Overall Cognitive Status:  (Possibly at baseline, but with moderate impairments across the board.) Arousal/Alertness: Awake/alert Orientation Level: Oriented to person;Oriented to place Year: 2024 Month: November Day of Week: Incorrect Attention: Focused;Sustained Focused Attention: Impaired Focused Attention Impairment: Verbal basic Sustained Attention: Impaired Sustained Attention Impairment: Verbal basic Memory: Impaired Memory Impairment: Decreased short term memory Decreased Short Term Memory: Verbal basic Awareness: Impaired Problem Solving: Impaired Problem Solving Impairment: Verbal basic Executive Function: Reasoning;Organizing Reasoning: Impaired Reasoning Impairment: Verbal  basic Organizing: Impaired Organizing Impairment: Functional basic Behaviors: Impulsive       Comprehension  Auditory Comprehension Commands: Impaired One Step Basic Commands: 75-100% accurate Two Step Basic Commands: 50-74% accurate Multistep Basic Commands: 50-74% accurate Conversation: Simple Interfering Components: Attention EffectiveTechniques: Repetition Visual Recognition/Discrimination Discrimination: Not tested Reading Comprehension Reading Status: Not tested    Expression Expression Primary Mode of Expression: Verbal Verbal Expression Repetition: No impairment Naming: No impairment Pragmatics: No impairment Interfering Components: Attention Written Expression Dominant Hand: Right Written Expression: Not tested   Oral / Motor  Motor Speech Overall Motor Speech: Appears within functional limits for tasks assessed Phonation: Normal Resonance: Within functional limits Articulation: Within functional limitis Intelligibility: Intelligible Motor Planning: Witnin functional limits Motor Speech Errors: Not applicable            Kerrie Pleasure, MA, CCC-SLP Acute Rehabilitation Services Office: [phone] 02/25/2023, 12:35 PM

## 2023-02-25 NOTE — Progress Notes (Signed)
   02/25/23 1132  Assess: MEWS Score  Temp (!) 100.9 F (38.3 C)  BP 124/86  MAP (mmHg) 97  Pulse Rate (!) 101  ECG Heart Rate (!) 101  Resp 18  Level of Consciousness Alert  Assess: MEWS Score  MEWS Temp 1  MEWS Systolic 0  MEWS Pulse 1  MEWS RR 0  MEWS LOC 0  MEWS Score 2  MEWS Score Color Yellow  Assess: if the MEWS score is Yellow or Red  Were vital signs accurate and taken at a resting state? Yes  Does the patient meet 2 or more of the SIRS criteria? Yes  Does the patient have a confirmed or suspected source of infection? Yes  MEWS guidelines implemented  Yes, yellow  Treat  MEWS Interventions Considered administering scheduled or prn medications/treatments as ordered  Take Vital Signs  Increase Vital Sign Frequency  Yellow: Q2hr x1, continue Q4hrs until patient remains green for 12hrs  Escalate  MEWS: Escalate Yellow: Discuss with charge nurse and consider notifying provider and/or RRT  Notify: Charge Nurse/RN  Name of Charge Nurse/RN Notified Ty, RN  Provider Notification  Provider Name/Title Dr. Caleb Popp  Date Provider Notified 02/25/23  Assess: SIRS CRITERIA  SIRS Temperature  0  SIRS Pulse 1  SIRS Respirations  0  SIRS WBC 1  SIRS Score Sum  2

## 2023-02-26 DIAGNOSIS — J189 Pneumonia, unspecified organism: Secondary | ICD-10-CM

## 2023-02-26 LAB — GLUCOSE, CAPILLARY
Glucose-Capillary: 104 mg/dL — ABNORMAL HIGH (ref 70–99)
Glucose-Capillary: 106 mg/dL — ABNORMAL HIGH (ref 70–99)
Glucose-Capillary: 109 mg/dL — ABNORMAL HIGH (ref 70–99)
Glucose-Capillary: 114 mg/dL — ABNORMAL HIGH (ref 70–99)
Glucose-Capillary: 123 mg/dL — ABNORMAL HIGH (ref 70–99)
Glucose-Capillary: 124 mg/dL — ABNORMAL HIGH (ref 70–99)
Glucose-Capillary: 135 mg/dL — ABNORMAL HIGH (ref 70–99)

## 2023-02-26 LAB — CBC
HCT: 26.7 % — ABNORMAL LOW (ref 39.0–52.0)
Hemoglobin: 9.4 g/dL — ABNORMAL LOW (ref 13.0–17.0)
MCH: 26.2 pg (ref 26.0–34.0)
MCHC: 35.2 g/dL (ref 30.0–36.0)
MCV: 74.4 fL — ABNORMAL LOW (ref 80.0–100.0)
Platelets: 459 10*3/uL — ABNORMAL HIGH (ref 150–400)
RBC: 3.59 MIL/uL — ABNORMAL LOW (ref 4.22–5.81)
RDW: 16.5 % — ABNORMAL HIGH (ref 11.5–15.5)
WBC: 19.8 10*3/uL — ABNORMAL HIGH (ref 4.0–10.5)
nRBC: 3.1 % — ABNORMAL HIGH (ref 0.0–0.2)

## 2023-02-26 LAB — CULTURE, BLOOD (ROUTINE X 2)
Culture: NO GROWTH
Culture: NO GROWTH

## 2023-02-26 MED ORDER — LORAZEPAM 1 MG PO TABS
2.0000 mg | ORAL_TABLET | Freq: Two times a day (BID) | ORAL | Status: DC | PRN
  Administered 2023-02-28: 2 mg via ORAL
  Filled 2023-02-26: qty 2

## 2023-02-26 MED ORDER — LINEZOLID 600 MG PO TABS
600.0000 mg | ORAL_TABLET | Freq: Two times a day (BID) | ORAL | Status: DC
  Administered 2023-02-26 – 2023-02-27 (×2): 600 mg via ORAL
  Filled 2023-02-26 (×3): qty 1

## 2023-02-26 NOTE — Plan of Care (Signed)
  Problem: Education: Goal: Ability to describe self-care measures that may prevent or decrease complications (Diabetes Survival Skills Education) will improve Outcome: Progressing Goal: Individualized Educational Video(s) Outcome: Progressing   Problem: Coping: Goal: Ability to adjust to condition or change in health will improve Outcome: Progressing   Problem: Fluid Volume: Goal: Ability to maintain a balanced intake and output will improve Outcome: Progressing   Problem: Health Behavior/Discharge Planning: Goal: Ability to identify and utilize available resources and services will improve Outcome: Progressing Goal: Ability to manage health-related needs will improve Outcome: Progressing   Problem: Metabolic: Goal: Ability to maintain appropriate glucose levels will improve Outcome: Progressing   Problem: Nutritional: Goal: Maintenance of adequate nutrition will improve Outcome: Progressing Goal: Progress toward achieving an optimal weight will improve Outcome: Progressing   Problem: Skin Integrity: Goal: Risk for impaired skin integrity will decrease Outcome: Progressing   Problem: Tissue Perfusion: Goal: Adequacy of tissue perfusion will improve Outcome: Progressing   Problem: Safety: Goal: Non-violent Restraint(s) Outcome: Progressing   Problem: Education: Goal: Knowledge of General Education information will improve Description: Including pain rating scale, medication(s)/side effects and non-pharmacologic comfort measures Outcome: Progressing   Problem: Health Behavior/Discharge Planning: Goal: Ability to manage health-related needs will improve Outcome: Progressing   Problem: Clinical Measurements: Goal: Ability to maintain clinical measurements within normal limits will improve Outcome: Progressing Goal: Will remain free from infection Outcome: Progressing Goal: Diagnostic test results will improve Outcome: Progressing Goal: Respiratory complications  will improve Outcome: Progressing Goal: Cardiovascular complication will be avoided Outcome: Progressing   Problem: Activity: Goal: Risk for activity intolerance will decrease Outcome: Progressing   Problem: Nutrition: Goal: Adequate nutrition will be maintained Outcome: Progressing   Problem: Coping: Goal: Level of anxiety will decrease Outcome: Progressing   Problem: Elimination: Goal: Will not experience complications related to bowel motility Outcome: Progressing Goal: Will not experience complications related to urinary retention Outcome: Progressing   Problem: Pain Management: Goal: General experience of comfort will improve Outcome: Progressing   Problem: Safety: Goal: Ability to remain free from injury will improve Outcome: Progressing   Problem: Skin Integrity: Goal: Risk for impaired skin integrity will decrease Outcome: Progressing   Problem: Education: Goal: Knowledge of disease or condition will improve Outcome: Progressing Goal: Knowledge of secondary prevention will improve (MUST DOCUMENT ALL) Outcome: Progressing Goal: Knowledge of patient specific risk factors will improve [name] N/A or DELETE if not current risk factor) Outcome: Progressing   Problem: Ischemic Stroke/TIA Tissue Perfusion: Goal: Complications of ischemic stroke/TIA will be minimized Outcome: Progressing   Problem: Coping: Goal: Will verbalize positive feelings about self Outcome: Progressing Goal: Will identify appropriate support needs Outcome: Progressing   Problem: Health Behavior/Discharge Planning: Goal: Ability to manage health-related needs will improve Outcome: Progressing Goal: Goals will be collaboratively established with patient/family Outcome: Progressing   Problem: Self-Care: Goal: Ability to participate in self-care as condition permits will improve Outcome: Progressing Goal: Verbalization of feelings and concerns over difficulty with self-care will  improve Outcome: Progressing Goal: Ability to communicate needs accurately will improve Outcome: Progressing   Problem: Nutrition: Goal: Risk of aspiration will decrease Outcome: Progressing Goal: Dietary intake will improve Outcome: Progressing

## 2023-02-26 NOTE — Progress Notes (Signed)
Inpatient Rehab Admissions Coordinator:    CIR following. Do not currently have a confirmed disposition plan. If inadequate support at discharge, would need SNF instead.  Megan Salon, MS, CCC-SLP Rehab Admissions Coordinator  [phone] (celll) [phone] (office)

## 2023-02-26 NOTE — Progress Notes (Signed)
PROGRESS NOTE    Peter Kline  ZOX:[id number] DOB: [birth year] DOA: 02/11/2023 PCP: Quentin Angst, MD  Chief Complaint  Patient presents with   Altered Mental Status    Brief Narrative:   Peter Kline is Peter Kline 57 y.o. male with Peter Kline history of sickle cell c disease with priapism, GERD, chronic pain, retinal hemorrhage, tobacco use, marijuana use, vitamin d deficiency. Patient presented secondary to altered mental status and required ICU admission for airway protection. Workup significant for acute strokes, acute DVT, aspiration pneumonia with hospitalization complicated by sepsis. Workup negative for CNS infection. Patient improved enough for extubation and transfer to the floor, with persistent fevers.   Assessment & Plan:   Principal Problem:   AMS (altered mental status) Active Problems:   Sepsis with encephalopathy without septic shock (HCC)   Encephalopathy acute   Acute respiratory failure with hypoxia (HCC)   Community acquired pneumonia   Protein-calorie malnutrition, severe   Cerebrovascular accident (CVA) due to thrombosis of precerebral artery (HCC)   Paradoxical embolism (HCC)   HAP (hospital-acquired pneumonia)  Acute metabolic encephalopathy Acute toxic encephalopathy Multifactorial in setting of polysubstance abuse and strokes, in addition to likely aspiration pneumonia. Infectious workup negative for CNS infection. Patient continues to get closer to baseline.   Acute embolic CVA MRI on 10/31 confirmed Peter Kline subcentimeter acute infarct in the left occipital white matter in addition to MRI on 11/3 significant for scattered small new acute infarcts in the bilateral cerebral and cerebellar hemispheres. CTA head and neck negative for large vessel occlusion or other emergent finding. LDL of 49. Hemoglobin A1C of 4.8%. Transthoracic Echocardiogram significant for normal LVEF of 55-60% with grade 2 diastolic dysfunction and no atrial level shunt. Transesophageal  Echocardiogram significant for positive bubble study suggesting PFO. Neurology recommendations for Heparin drip with transition to Eliquis when able. PT/OT recommendations for likely SNF pending cognitive improvement. Heparin discontinued -neurology c/s, appreciate recs (signed off 11/8) - bilateral cerebral and cerebellar hemisphere strokes from paradoxical embolism due to R to L cardiac shunt and presence of DVT with concurrent small vessel disease.  Neurology recommending eliquis. Statin not indicated as LDL within goal.    Aspiration pneumonia Recurrent Fevers Sepsis Staph Epidermidis Pneumonia Acute respiratory failure with hypoxia Related to acute encephalopathy and aspiration pneumonia  CT chest with patchy airspace consolidation in bilateral lower lobes, L>R and minimally in RUL concerning for multifocal pneumonia - secretions in distal trachea and L mainstem bronchus Appreciate ID assistance -> planning for 7 days linezolid for MRSE Pneumonia   Acute bilateral lower extremity DVT Acute DVT involving the left posterior tibial veins and left peroneal veins. Age indeterminate DVT involving the right posterior tibial veins and right peroneal veins. Patient managed on heparin and transitioned to Eliquis. -Continue Eliquis PO   Dysuria Urine culture with no growth. Symptoms are improved.   Hypernatremia improved   Sickle cell C disease Noted. Patient is on folic acid as an outpatient. -Continue folic acid   GERD Noted.   Dysphagia Improved, SLP recommending regular/thin liquids on 11/12 NG tube discontinued today, tolerating diet well     DVT prophylaxis: eliquis Code Status: full Family Communication: none Disposition:   Status is: Inpatient Remains inpatient appropriate because: need for continued inpatient care   Consultants:  ID  Procedures:  LE [place] Summary:  BILATERAL:  -No evidence of popliteal cyst, bilaterally.  RIGHT:   - Findings consistent with age  indeterminate deep vein thrombosis  involving the right posterior tibial veins,  and right peroneal veins.        LEFT:  - Findings consistent with acute deep vein thrombosis involving the left  posterior tibial veins, and left peroneal veins.   TEE IMPRESSIONS     1. Left ventricular ejection fraction, by estimation, is 55 to 60%. The  left ventricle has normal function. The left ventricle has no regional  wall motion abnormalities.   2. Right ventricular systolic function is normal. The right ventricular  size is normal. Tricuspid regurgitation signal is inadequate for assessing  PA pressure.   3. Left atrial size was mildly dilated. No left atrial/left atrial  appendage thrombus was detected.   4. The mitral valve is normal in structure. Trivial mitral valve  regurgitation. No evidence of mitral stenosis.   5. The aortic valve is tricuspid. Aortic valve regurgitation is not  visualized. No aortic stenosis is present.   6. Bubble study positive suggesting PFO.   Echo IMPRESSIONS     1. Left ventricular ejection fraction, by estimation, is 55 to 60%. The  left ventricle has normal function. The left ventricle has no regional  wall motion abnormalities. Left ventricular diastolic parameters are  consistent with Grade II diastolic  dysfunction (pseudonormalization).   2. RV-RA gradient 33 mmHg suggesting at least mild to moderate increase  in RVSP depending on CVP. Right ventricular systolic function is normal.  The right ventricular size is normal.   3. Left atrial size was moderately dilated.   4. The mitral valve is abnormal, mildly calcified with somewhat  restricted posterior leaflet. Mild to moderate mitral valve regurgitation.   5. The aortic valve is tricuspid. Aortic valve regurgitation is not  visualized.   6. The inferior vena cava is normal in size with <50% respiratory  variability, suggesting right atrial pressure of 8 mmHg.   Comparison(s): No prior  Echocardiogram.    Antimicrobials:  Anti-infectives (From admission, onward)    Start     Dose/Rate Route Frequency Ordered Stop   02/26/23 2200  linezolid (ZYVOX) tablet 600 mg        600 mg Oral Every 12 hours 02/26/23 1016     02/23/23 2100  vancomycin (VANCOREADY) IVPB 1250 mg/250 mL  Status:  Discontinued        1,250 mg 166.7 mL/hr over 90 Minutes Intravenous Every 12 hours 02/23/23 1144 02/26/23 1016   02/20/23 2100  vancomycin (VANCOREADY) IVPB 1500 mg/300 mL  Status:  Discontinued        1,500 mg 150 mL/hr over 120 Minutes Intravenous Every 12 hours 02/20/23 0929 02/23/23 1144   02/20/23 0830  vancomycin (VANCOREADY) IVPB 1500 mg/300 mL        1,500 mg 150 mL/hr over 120 Minutes Intravenous  Once 02/20/23 0744 02/20/23 1128   02/18/23 1945  piperacillin-tazobactam (ZOSYN) IVPB 3.375 g  Status:  Discontinued        3.375 g 12.5 mL/hr over 240 Minutes Intravenous Every 8 hours 02/18/23 1859 02/20/23 1452   02/17/23 0800  cefTRIAXone (ROCEPHIN) 2 g in sodium chloride 0.9 % 100 mL IVPB        2 g 200 mL/hr over 30 Minutes Intravenous Every 24 hours 02/17/23 0714 02/19/23 1936   02/14/23 1200  Ampicillin-Sulbactam (UNASYN) 3 g in sodium chloride 0.9 % 100 mL IVPB  Status:  Discontinued        3 g 200 mL/hr over 30 Minutes Intravenous Every 6 hours 02/14/23 1053 02/17/23 0740   02/14/23 1000  cefTRIAXone (ROCEPHIN) 2  g in sodium chloride 0.9 % 100 mL IVPB  Status:  Discontinued        2 g 200 mL/hr over 30 Minutes Intravenous Every 24 hours 02/13/23 1318 02/14/23 1052   02/13/23 1000  doxycycline (VIBRA-TABS) tablet 100 mg  Status:  Discontinued        100 mg Per Tube Every 12 hours 02/13/23 0853 02/14/23 1052   02/12/23 1000  cefTRIAXone (ROCEPHIN) 2 g in sodium chloride 0.9 % 100 mL IVPB  Status:  Discontinued        2 g 200 mL/hr over 30 Minutes Intravenous Every 12 hours 02/12/23 0638 02/13/23 1318   02/12/23 0800  ampicillin (OMNIPEN) 2 g in sodium chloride 0.9 % 100 mL  IVPB  Status:  Discontinued        2 g 300 mL/hr over 20 Minutes Intravenous Every 4 hours 02/12/23 0638 02/13/23 0853   02/12/23 0800  vancomycin (VANCOCIN) IVPB 1000 mg/200 mL premix  Status:  Discontinued        1,000 mg 200 mL/hr over 60 Minutes Intravenous Every 8 hours 02/12/23 0703 02/13/23 0853   02/11/23 2300  acyclovir (ZOVIRAX) 825 mg in dextrose 5 % 250 mL IVPB  Status:  Discontinued        10 mg/kg  82.6 kg 266.5 mL/hr over 60 Minutes Intravenous Every 8 hours 02/11/23 2251 02/13/23 0853   02/11/23 2145  vancomycin (VANCOREADY) IVPB 1750 mg/350 mL        1,750 mg 175 mL/hr over 120 Minutes Intravenous  Once 02/11/23 2137 02/12/23 0231   02/11/23 2145  ampicillin (OMNIPEN) 2 g in sodium chloride 0.9 % 100 mL IVPB        2 g 300 mL/hr over 20 Minutes Intravenous  Once 02/11/23 2138 02/11/23 2316   02/11/23 2115  cefTRIAXone (ROCEPHIN) 2 g in sodium chloride 0.9 % 100 mL IVPB        2 g 200 mL/hr over 30 Minutes Intravenous  Once 02/11/23 2101 02/11/23 2223       Subjective: Excited to get NG tube out No other complaints  Objective: Vitals:   02/26/23 0820 02/26/23 1117 02/26/23 1431 02/26/23 1526  BP: (!) 128/99 128/79 135/83 (!) 120/92  Pulse: 74 97 100 96  Resp: 16 16  18   Temp: 98.2 F (36.8 C) 98.2 F (36.8 C) 98.5 F (36.9 C) 98.4 F (36.9 C)  TempSrc: Oral Oral  Oral  SpO2: 98% 96% 100% 94%  Weight:      Height:        Intake/Output Summary (Last 24 hours) at 02/26/2023 1734 Last data filed at 02/26/2023 1442 Gross per 24 hour  Intake 480 ml  Output 1500 ml  Net -1020 ml   Filed Weights   02/24/23 0533 02/25/23 0722 02/26/23 0716  Weight: 79.9 kg 78 kg 77.9 kg    Examination:  General exam: Appears calm and comfortable  Respiratory system: diminished, Panacea in place Cardiovascular system: RRR Gastrointestinal system: Abdomen is nondistended, soft and nontender.  Central nervous system: Alert and oriented. No focal neurological  deficits. Extremities: no LEE    Data Reviewed: I have personally reviewed following labs and imaging studies  CBC: Recent Labs  Lab 02/21/23 0523 02/22/23 0428 02/23/23 0930 02/24/23 0607 02/25/23 0439 02/26/23 0426  WBC 21.2*  22.1* 26.2* 30.4* 25.0* 22.8* 19.8*  NEUTROABS 16.3*  --   --   --   --   --   HGB 9.7*  9.6* 10.5* 9.5*  8.5* 8.7* 9.4*  HCT 27.9*  27.3* 30.3* 26.9* 24.4* 24.2* 26.7*  MCV 77.1*  76.7* 77.5* 75.4* 74.8* 74.2* 74.4*  PLT 287  281 293 355 383 467* 459*    Basic Metabolic Panel: Recent Labs  Lab 02/20/23 0301 02/21/23 0523 02/22/23 0428 02/23/23 0930 02/25/23 0439  NA 150* 148* 149* 144 138  K 3.6 3.5 3.8 3.8 3.9  CL 114* 115* 114* 111 106  CO2 26 25 25 22 22   GLUCOSE 134* 152* 130* 122* 129*  BUN 21* 19 20 16 17   CREATININE 0.91 1.06 1.10 0.88 0.77  CALCIUM 8.8* 8.3* 8.7* 8.5* 8.2*  MG 3.0* 2.6* 2.8*  --   --   PHOS 2.0*  --   --   --   --     GFR: Estimated Creatinine Clearance: 110.5 mL/min (by C-G formula based on SCr of 0.77 mg/dL).  Liver Function Tests: No results for input(s): "AST", "ALT", "ALKPHOS", "BILITOT", "PROT", "ALBUMIN" in the last 168 hours.  CBG: Recent Labs  Lab 02/25/23 2023 02/26/23 0021 02/26/23 0858 02/26/23 1218 02/26/23 1525  GLUCAP 116* 123* 135* 124* 114*     Recent Results (from the past 240 hour(s))  Culture, blood (Routine X 2) w Reflex to ID Panel     Status: None   Collection Time: 02/16/23  8:03 PM   Specimen: BLOOD LEFT HAND  Result Value Ref Range Status   Specimen Description BLOOD LEFT HAND  Final   Special Requests   Final    BOTTLES DRAWN AEROBIC AND ANAEROBIC Blood Culture results may not be optimal due to an excessive volume of blood received in culture bottles   Culture   Final    NO GROWTH 5 DAYS Performed at [facility], [address], [place], [place] [address]    Report Status 02/21/2023 FINAL  Final  Culture, blood (Routine X 2) w Reflex to ID Panel      Status: None   Collection Time: 02/16/23  8:09 PM   Specimen: BLOOD LEFT ARM  Result Value Ref Range Status   Specimen Description BLOOD LEFT ARM  Final   Special Requests   Final    BOTTLES DRAWN AEROBIC AND ANAEROBIC Blood Culture results may not be optimal due to an excessive volume of blood received in culture bottles   Culture   Final    NO GROWTH 5 DAYS Performed at [facility], [address], [place], [place] [address]    Report Status 02/21/2023 FINAL  Final  CSF culture w Gram Stain     Status: None   Collection Time: 02/17/23  2:35 PM   Specimen: PATH Cytology CSF; Cerebrospinal Fluid  Result Value Ref Range Status   Specimen Description CSF  Final   Special Requests NONE  Final   Gram Stain NO WBC SEEN NO ORGANISMS SEEN CYTOSPIN SMEAR   Final   Culture   Final    NO GROWTH 3 DAYS Performed at [facility], [address], [place], [place] [address]    Report Status 02/21/2023 FINAL  Final  Surgical PCR screen     Status: Abnormal   Collection Time: 02/17/23  7:47 PM   Specimen: Nasal Mucosa; Nasal Swab  Result Value Ref Range Status   MRSA, PCR ([place]oy[place]) NEGATIVE Final    INVALID, UNABLE TO DETERMINE THE PRESENCE OF TARGET DUE TO SPECIMEN INTEGRITY. RECOLLECTION REQUESTED.    Comment: RESULT CALLED TO, READ BACK BY AND VERIFIED  WITH: RN C. RUECKEL [phone] @2201  FH    Staphylococcus aureus (Peter Kline) NEGATIVE Final    INVALID, UNABLE TO DETERMINE THE PRESENCE OF TARGET DUE TO SPECIMEN INTEGRITY. RECOLLECTION REQUESTED.    Comment: Performed at [facility], [address], [place], [place] [address]  Surgical PCR screen     Status: None   Collection Time: 02/17/23 10:10 PM   Specimen: Nasal Mucosa; Nasal Swab  Result Value Ref Range Status   MRSA, PCR NEGATIVE NEGATIVE Final   Staphylococcus aureus NEGATIVE NEGATIVE Final    Comment: (NOTE) The Xpert SA Assay (FDA approved for NASAL specimens in patients 41 years of age and older), is one component of Peter Kline  comprehensive surveillance program. It is not intended to diagnose infection nor to guide or monitor treatment. Performed at [facility], [address] [place], [place] [address]   Culture, Respiratory w Gram Stain     Status: None   Collection Time: 02/18/23  7:09 AM   Specimen: Tracheal Aspirate; Respiratory  Result Value Ref Range Status   Specimen Description TRACHEAL ASPIRATE  Final   Special Requests NONE  Final   Gram Stain   Final    RARE WBC PRESENT,BOTH PMN AND MONONUCLEAR NO ORGANISMS SEEN Performed at [facility], [address], [place], [place] [address]    Culture MODERATE STAPHYLOCOCCUS EPIDERMIDIS  Final   Report Status 02/20/2023 FINAL  Final   Organism ID, Bacteria STAPHYLOCOCCUS EPIDERMIDIS  Final      Susceptibility   Staphylococcus epidermidis - MIC*    CIPROFLOXACIN >=8 RESISTANT Resistant     ERYTHROMYCIN >=8 RESISTANT Resistant     GENTAMICIN <=0.5 SENSITIVE Sensitive     OXACILLIN >=4 RESISTANT Resistant     TETRACYCLINE <=1 SENSITIVE Sensitive     VANCOMYCIN 2 SENSITIVE Sensitive     TRIMETH/SULFA <=10 SENSITIVE Sensitive     CLINDAMYCIN <=0.25 SENSITIVE Sensitive     RIFAMPIN <=0.5 SENSITIVE Sensitive     Inducible Clindamycin NEGATIVE Sensitive     * MODERATE STAPHYLOCOCCUS EPIDERMIDIS  Urine Culture     Status: None   Collection Time: 02/21/23  9:47 AM   Specimen: Urine, Clean Catch  Result Value Ref Range Status   Specimen Description URINE, CLEAN CATCH  Final   Special Requests NONE  Final   Culture   Final    NO GROWTH Performed at [facility], [address], [place], [place] [address]    Report Status 02/22/2023 FINAL  Final  Culture, blood (Routine X 2) w Reflex to ID Panel     Status: None   Collection Time: 02/21/23  1:21 PM   Specimen: BLOOD RIGHT ARM  Result Value Ref Range Status   Specimen Description BLOOD RIGHT ARM  Final   Special Requests   Final    BOTTLES DRAWN AEROBIC AND ANAEROBIC Blood Culture  results may not be optimal due to an inadequate volume of blood received in culture bottles   Culture   Final    NO GROWTH 5 DAYS Performed at [facility], [address] [place], [place] [address]    Report Status 02/26/2023 FINAL  Final  Culture, blood (Routine X 2) w Reflex to ID Panel     Status: None   Collection Time: 02/21/23  1:24 PM   Specimen: BLOOD RIGHT WRIST  Result Value Ref Range Status   Specimen Description BLOOD RIGHT WRIST  Final   Special Requests  Final    BOTTLES DRAWN AEROBIC AND ANAEROBIC Blood Culture results may not be optimal due to an inadequate volume of blood received in culture bottles   Culture   Final    NO GROWTH 5 DAYS Performed at [facility], [address], [place], [place] [address]    Report Status 02/26/2023 FINAL  Final         Radiology Studies: No results found.      Scheduled Meds:  acetaminophen  1,000 mg Oral Q6H   apixaban  5 mg Oral BID   busPIRone  30 mg Oral Q8H   Or   busPIRone  30 mg Per Tube Q8H   feeding supplement  237 mL Oral BID BM   folic acid  1 mg Oral Daily   insulin aspart  0-9 Units Subcutaneous Q4H   linezolid  600 mg Oral Q12H   multivitamin with minerals  1 tablet Oral Daily   nicotine  14 mg Transdermal Daily   mouth rinse  15 mL Mouth Rinse 4 times per day   sodium chloride flush  10 mL Intravenous Q12H   thiamine  100 mg Oral Daily   Continuous Infusions:   LOS: 14 days    Time spent: over 30 min     Lacretia Nicks, MD [facility]   To contact the attending provider between 7A-7P or the covering provider during after hours 7P-7A, please log into the web site [email or web address] and access using universal  password for that web site. If you do not have the password, please call the hospital operator.  02/26/2023, 5:34 PM

## 2023-02-26 NOTE — Progress Notes (Signed)
Inpatient Rehab Admissions Coordinator:    I was not able to reach Pt.'s sister at the number I was given. Pt. States he thinks his aunt Britta Mccreedy or other family may be able to assist at d/c but does not have their numbers. States he will work on getting contact information of family who can assist at d/c so that we can confirm this. He also stated that stephanie is his wife, not his girlfriend. His aunt had previously stated that they were not married and that she did not have capacity to make decisions.As Pt. Has not yet identified caregiver support that we can confirm, I am going to recommend that he be worked up for SNF as a backup.   Megan Salon, MS, CCC-SLP Rehab Admissions Coordinator  [phone] (celll) [phone] (office)

## 2023-02-26 NOTE — TOC Progression Note (Signed)
Transition of Care [facility]) - Progression Note    Patient Details  Name: Peter Kline MRN: [id number] Date of Birth: 12/07/1965  Transition of Care [facility]) CM/SW Contact  [name], [place] Phone Number: 02/26/2023, 3:22 PM  Clinical Narrative:   CSW alerted by Rehab Admissions that patient may not have support for CIR stay, may need alternative option. CSW sent referral to [facility] for review, and also completed workup for SNF, if needed. Awaiting responses. CSW to follow.    Expected Discharge Plan: IP Rehab Facility    Expected Discharge Plan and Services                                               Social Determinants of Health (SDOH) Interventions SDOH Screenings   Food Insecurity: No Food Insecurity (02/25/2023)  Housing: Patient Declined (02/25/2023)  Transportation Needs: No Transportation Needs (02/25/2023)  Utilities: Not At Risk (02/25/2023)  Depression (PHQ2-9): Low Risk  (11/25/2018)  Financial Resource Strain: Low Risk  (11/15/2021)   Received from [facility]  Tobacco Use: High Risk (02/11/2023)    Readmission Risk Interventions     No data to display

## 2023-02-26 NOTE — NC FL2 (Cosign Needed Addendum)
[place] MEDICAID [place]2 LEVEL OF CARE FORM     IDENTIFICATION  Patient Name: Peter Kline Birthdate: 28-Jan-1966 Sex: male Admission Date (Current Location): 02/11/2023  [facility] and [place] Number:  Producer, television/film/video and Address:  [facility] [place], [address], [place], [place] [address]      Provider Number: [id number]  Attending Physician Name and Address:  Zigmund Daniel., *  Relative Name and Phone Number:       Current Level of Care: Hospital Recommended Level of Care: Skilled Nursing Facility Prior Approval Number:    Date Approved/Denied:   PASRR Number: [id number]  Discharge Plan: SNF    Current Diagnoses: Patient Active Problem List   Diagnosis Date Noted   Paradoxical embolism (HCC) 02/20/2023   Cerebrovascular accident (CVA) due to thrombosis of precerebral artery (HCC) 02/18/2023   AMS (altered mental status) 02/12/2023   Sepsis with encephalopathy without septic shock (HCC) 02/12/2023   Encephalopathy acute 02/12/2023   Acute respiratory failure with hypoxia (HCC) 02/12/2023   Community acquired pneumonia 02/12/2023   Protein-calorie malnutrition, severe 02/12/2023   Acute sickle cell crisis (HCC) 12/25/2020   Flail chest 12/25/2020   Left rib fracture 12/25/2020   Chronic prescription opiate use 06/18/2017   Vitamin D deficiency 05/03/2014   Gastroesophageal reflux disease without esophagitis 01/24/2014   Tobacco dependence 01/24/2014   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/12/2013   Elevated liver enzymes 10/01/2013   Fatigue 10/01/2013   Loss of weight 10/01/2013   Sickle cell anemia with pain (HCC) 08/26/2013   Lower urinary tract symptoms (LUTS) 03/09/2013   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 09/30/2012   Sickle cell disease, type [place] (HCC) 09/30/2012    Orientation RESPIRATION BLADDER Height & Weight     Self, Place  Normal Continent Weight: 171 lb 11.8 oz (77.9 kg) Height:  6' 0.01" (182.9 cm)   BEHAVIORAL SYMPTOMS/MOOD NEUROLOGICAL BOWEL NUTRITION STATUS      Incontinent Diet (DYS 3)  AMBULATORY STATUS COMMUNICATION OF NEEDS Skin   Extensive Assist Verbally                         Personal Care Assistance Level of Assistance  Bathing, Feeding, Dressing Bathing Assistance: Limited assistance Feeding assistance: Limited assistance Dressing Assistance: Maximum assistance     Functional Limitations Info             SPECIAL CARE FACTORS FREQUENCY  PT (By licensed PT), OT (By licensed OT)     PT Frequency: 5x/week OT Frequency: 5x/week            Contractures      Additional Factors Info  Code Status, Allergies Code Status Info: Full Allergies Info: NKA           Current Medications (02/26/2023):  This is the current hospital active medication list Current Facility-Administered Medications  Medication Dose Route Frequency Provider Last Rate Last Admin   acetaminophen (TYLENOL) tablet 1,000 mg  1,000 mg Oral Q6H Narda Bonds, MD   1,000 mg at 02/26/23 1122   apixaban (ELIQUIS) tablet 5 mg  5 mg Oral BID Narda Bonds, MD   5 mg at 02/26/23 0915   bisacodyl (DULCOLAX) suppository 10 mg  10 mg Rectal Daily PRN Selmer Dominion B, NP   10 mg at 02/15/23 1616   busPIRone (BUSPAR) tablet 30 mg  30 mg Oral Q8H Lorin Glass, MD   30 mg at 02/24/23 0015  Or   busPIRone (BUSPAR) tablet 30 mg  30 mg Per Tube Q8H Lorin Glass, MD   30 mg at 02/24/23 1711   feeding supplement (ENSURE ENLIVE / ENSURE PLUS) liquid 237 mL  237 mL Oral BID BM Lorin Glass, MD   237 mL at 02/24/23 1446   feeding supplement (OSMOLITE 1.5 CAL) liquid 1,000 mL  1,000 mL Per Tube Q24H Narda Bonds, MD   1,000 mL at 02/25/23 1740   feeding supplement (PROSource TF20) liquid 60 mL  60 mL Per Tube BID Lynnell Catalan, MD   60 mL at 02/26/23 0915   folic acid (FOLVITE) tablet 1 mg  1 mg Oral Daily Narda Bonds, MD   1 mg at 02/26/23 0915   free water 200 mL  200 mL Per Tube  Q4H Mertha Baars E, PA-C   200 mL at 02/26/23 1232   insulin aspart (novoLOG) injection 0-9 Units  0-9 Units Subcutaneous Q4H Briant Sites, DO   1 Units at 02/26/23 1230   ipratropium-albuterol (DUONEB) 0.5-2.5 (3) MG/3ML nebulizer solution 3 mL  3 mL Nebulization Q6H PRN Briant Sites, DO   3 mL at 02/26/23 1122   linezolid (ZYVOX) tablet 600 mg  600 mg Oral Q12H Odette Fraction, MD       LORazepam (ATIVAN) tablet 2 mg  2 mg Oral BID PRN Hammons, Kimberly B, RPH       multivitamin with minerals tablet 1 tablet  1 tablet Oral Daily Narda Bonds, MD   1 tablet at 02/26/23 0915   nicotine (NICODERM CQ - dosed in mg/24 hours) patch 14 mg  14 mg Transdermal Daily Hetty Blend C, NP   14 mg at 02/26/23 6962   Oral care mouth rinse  15 mL Mouth Rinse 4 times per day Lorin Glass, MD   15 mL at 02/26/23 1231   Oral care mouth rinse  15 mL Mouth Rinse PRN Lorin Glass, MD       oxyCODONE (Oxy IR/ROXICODONE) immediate release tablet 5 mg  5 mg Oral Q4H PRN Narda Bonds, MD   5 mg at 02/26/23 0920   sodium chloride flush (NS) 0.9 % injection 10 mL  10 mL Intravenous Q12H Hetty Blend C, NP   10 mL at 02/25/23 2131   thiamine (VITAMIN B1) tablet 100 mg  100 mg Oral Daily Narda Bonds, MD   100 mg at 02/26/23 0915     Discharge Medications: Please see discharge summary for a list of discharge medications.  Relevant Imaging Results:  Relevant Lab Results:   Additional Information SSN: [id number]  Antion Felipa [place], Student-Social Work

## 2023-02-26 NOTE — Progress Notes (Signed)
Physical Therapy Treatment Patient Details Name: Peter Kline MRN: [id number] DOB: 10/01/1965 Today's Date: 02/26/2023   History of Present Illness Pt is a 57 y/o male presenting to the ED on 10/30 with AMS, found wandering the streets of [facility] confused with slurred speech.  Initially negative for stroke.  Mental status failed to improve, pt became more agitated and eventually was intubated for airway protection.  Chest xray showed multifocal PNA.  11/1 had small aspiration event that led to further agitation and further aspiration, 11/3 repeat MRI showed scattered new infarct in bil cerebrum and cerebellum, 11/5  bil LE DVT+  PMHx:  sickle cell anemia, lap repair of gastric ulcer.    PT Comments  Pt greeted resting in bed and agreeable to session. Pt with improved impulsivity this session needing less cues for safety awareness. Pt requiring min A for bed mobility and transfers to stand as pt with initial posterior lean on rise. Pt able ot progress gait distance with RW for support and up to mod A to maintain balance. Pt able to maintain RW in contact with floor throughout OOB mobility without cues. Pt up in chair with chair alarm set, door open, and RN aware of pt location as pt with history of impulsivity. Current plan remains appropriate to address deficits and maximize functional independence and decrease caregiver burden. Pt continues to benefit from skilled PT services to progress toward functional mobility goals.     If plan is discharge home, recommend the following: A lot of help with walking and/or transfers;A lot of help with bathing/dressing/bathroom;Assistance with cooking/housework;Direct supervision/assist for medications management;Direct supervision/assist for financial management;Assist for transportation;Help with stairs or ramp for entrance   Can travel by private vehicle        Equipment Recommendations  Other (comment) (TBD)    Recommendations for Other Services        Precautions / Restrictions Precautions Precautions: Fall Restrictions Weight Bearing Restrictions: No     Mobility  Bed Mobility Overal bed mobility: Needs Assistance Bed Mobility: Supine to Sit Rolling: Min assist         General bed mobility comments: light min A to elevate trunk    Transfers Overall transfer level: Needs assistance Equipment used: Rolling walker (2 wheels) Transfers: Sit to/from Stand Sit to Stand: Min assist           General transfer comment: min A to boos up and gain balance as pt with initial posterior lean    Ambulation/Gait Ambulation/Gait assistance: Mod assist, Min assist Gait Distance (Feet): 70 Feet Assistive device: Rolling walker (2 wheels) Gait Pattern/deviations: Step-through pattern, Decreased stance time - right, Decreased stride length, Drifts right/left Gait velocity: decr     General Gait Details: mod A to maintain balance, decreased stance time on R with some L lateral lean. pt able to maintain RW incontact with floor throughout, SpO2 to 88% on RA   Stairs             Wheelchair Mobility     Tilt Bed    Modified Rankin (Stroke Patients Only)       Balance Overall balance assessment: Needs assistance Sitting-balance support: Bilateral upper extremity supported, Single extremity supported, No upper extremity supported, Feet supported Sitting balance-Leahy Scale: Fair Sitting balance - Comments: able to maintain without UE support   Standing balance support: Bilateral upper extremity supported Standing balance-Leahy Scale: Poor Standing balance comment: reliant on UE support and external assist  Cognition Arousal: Alert Behavior During Therapy: WFL for tasks assessed/performed Overall Cognitive Status:  (Possibly at baseline, but with moderate impairments across the board.) Area of Impairment: Orientation, Attention, Following commands, Safety/judgement,  Awareness, Problem solving                 Orientation Level: Disoriented to, Time, Place (able to state year, when asked month kept repeating, stating we were at rehab and nursing home despite redirection to hosiptal throughout session) Current Attention Level: Focused   Following Commands: Follows one step commands with increased time Safety/Judgement: Decreased awareness of safety, Decreased awareness of deficits Awareness: Emergent Problem Solving: Slow processing, Decreased initiation General Comments: pt with improved impulsivity        Exercises      General Comments General comments (skin integrity, edema, etc.): VSS on 2L O2 on arrival, trialed RA during activity, dropping to 88% with ambualtion, replaced 2L at end of session with SpO2 improving to >90%      Pertinent Vitals/Pain Pain Assessment Pain Assessment: No/denies pain Pain Intervention(s): Monitored during session    Home Living                          Prior Function            PT Goals (current goals can now be found in the care plan section) Acute Rehab PT Goals PT Goal Formulation: Patient unable to participate in goal setting Time For Goal Achievement: 03/06/23 Progress towards PT goals: Progressing toward goals    Frequency    Min 1X/week      PT Plan      Co-evaluation              AM-PAC PT "6 Clicks" Mobility   Outcome Measure  Help needed turning from your back to your side while in a flat bed without using bedrails?: A Lot Help needed moving from lying on your back to sitting on the side of a flat bed without using bedrails?: A Lot Help needed moving to and from a bed to a chair (including a wheelchair)?: A Little Help needed standing up from a chair using your arms (e.g., wheelchair or bedside chair)?: A Little Help needed to walk in hospital room?: A Lot Help needed climbing 3-5 steps with a railing? : Total 6 Click Score: 13    End of Session  Equipment Utilized During Treatment: Gait belt Activity Tolerance: Patient tolerated treatment well Patient left: with call bell/phone within reach;in chair;with chair alarm set (with door open and RN aware on pt position) Nurse Communication: Mobility status PT Visit Diagnosis: Other abnormalities of gait and mobility (R26.89);Muscle weakness (generalized) (M62.81);Other symptoms and signs involving the nervous system (R29.898)     Time: 7829-5621 PT Time Calculation (min) (ACUTE ONLY): 26 min  Charges:    $Gait Training: 8-22 mins $Therapeutic Activity: 8-22 mins PT General Charges $$ ACUTE PT VISIT: 1 Visit                     Cheick Suhr R. PTA Acute Rehabilitation Services Office: [phone]   Catalina Antigua 02/26/2023, 2:31 PM

## 2023-02-26 NOTE — Consult Note (Addendum)
I have seen and examined the patient. I have personally reviewed the clinical findings, laboratory findings, microbiological data and imaging studies. The assessment and treatment plan was discussed with the Nurse Practitioner. I agree with her/his recommendations except following additions/corrections.  57 year old male with history of sickle cell disease with priapism, GERD, chronic pain, retinal hemorrhage who initially presented secondary to altered mental status with initial hospital admission requiring ICU admission for airway protection.  Hospital course was remarkable for acute CVA, acute DVT, aspiration pneumonia/sepsis.  Workup for CNS infection with LP  11/4 was negative.  Patient was later extubated and transferred to the floor. ID consulted for intermittent fevers and leukocytosis  Exam - Adult male lying in the bed, HEENT wnl, on room air. Cortrak +. Heart - RRR, Pulmonary bibasilar rales+, abdomen soft, non distended and non tender, MSK - no pedal edema, skin - no rashes, awake, alert and oriented.   Fever curve down trending, Leukocytosis is downtrending. 11/5 Resp cx is MRSE Concerns for aspiration noted as well Clinically improving      Latest Ref Rng & Units 02/26/2023    4:26 AM 02/25/2023    4:39 AM 02/24/2023    6:07 AM  CBC  WBC 4.0 - 10.5 K/uL 19.8  22.8  25.0   Hemoglobin 13.0 - 17.0 g/dL 9.4  8.7  8.5   Hematocrit 39.0 - 52.0 % 26.7  24.2  24.4   Platelets 150 - 400 K/uL 459  467  383       Latest Ref Rng & Units 02/25/2023    4:39 AM 02/23/2023    9:30 AM 02/22/2023    4:28 AM  CMP  Glucose 70 - 99 mg/dL 161  096  045   BUN 6 - 20 mg/dL 17  16  20    Creatinine 0.61 - 1.24 mg/dL 4.09  8.11  9.14   Sodium 135 - 145 mmol/L 138  144  149   Potassium 3.5 - 5.1 mmol/L 3.9  3.8  3.8   Chloride 98 - 111 mmol/L 106  111  114   CO2 22 - 32 mmol/L 22  22  25    Calcium 8.9 - 10.3 mg/dL 8.2  8.5  8.7      Plan  Will switch IV Vancomycin to linezolid to complete  7 days course for MRSE PNA. Do not need to add GN or anaerobic coverage currently as he is improving on antiMRSE tx Monitor fevers, CBC and clinical improvement   I have personally spent 80 minutes involved in face-to-face and non-face-to-face activities for this patient on the day of the visit. Professional time spent includes the following activities: Preparing to see the patient (review of tests), Obtaining and/or reviewing separately obtained history (admission/discharge record), Performing a medically appropriate examination and/or evaluation , Ordering medications/tests/procedures, referring and communicating with other health care professionals, Documenting clinical information in the EMR, Independently interpreting results (not separately reported), Communicating results to the patient/family/caregiver, Counseling and educating the patient/family/caregiver and Care coordination (not separately reported).       [facility]    Date of Admission:  02/11/2023     Total days of antibiotics 16               Reason for Consult: Aspiration Pneumonia  Referring Provider: Dr. Caleb Popp Primary Care Provider: Quentin Angst, MD   ASSESSMENT:  Peter Kline is a 57 y/o AA male admitted with encephalopathy and found to have new CVA with  course complicated by need for airway protection, now extubated, and on-going fevers with concern for aspiration pneumonia. Currently on Day 7 of antibiotic therapy with vancomycin with vancomycin trough on the lower end of therapeutic level at 12. No other clear source of infection on exam blood cultures were negative. Respiratory culture with MRSE. Will change vancomycin to linezolid and continue to monitor fever curve and WBC count for continued improvements. Plan of care discussed with Peter Kline and family member at bedside with questions answered. Currently not anticipating a long course of antibiotics. Remaining medical and supportive  care per Internal Medicine.   PLAN:  Change antibiotics to linezolid. Monitor fever curve and clinical exam for continued improvement with duration to be determined. Remaining medical and supportive care per Internal Medicine.    I have personally spent 32 minutes involved in face-to-face and non-face-to-face activities for this patient on the day of the visit. Professional time spent includes the following activities: Preparing to see the patient (review of tests), Obtaining and/or reviewing separately obtained history (admission/discharge record), Performing a medically appropriate examination and/or evaluation , Ordering medications/tests/procedures, referring and communicating with other health care professionals, Documenting clinical information in the EMR, Independently interpreting results (not separately reported), Communicating results to the patient/family/caregiver, Counseling and educating the patient/family/caregiver and Care coordination (not separately reported).    Principal Problem:   AMS (altered mental status) Active Problems:   Sepsis with encephalopathy without septic shock (HCC)   Encephalopathy acute   Acute respiratory failure with hypoxia (HCC)   Community acquired pneumonia   Protein-calorie malnutrition, severe   Cerebrovascular accident (CVA) due to thrombosis of precerebral artery (HCC)   Paradoxical embolism (HCC)    acetaminophen  1,000 mg Oral Q6H   apixaban  5 mg Oral BID   busPIRone  30 mg Oral Q8H   Or   busPIRone  30 mg Per Tube Q8H   feeding supplement  237 mL Oral BID BM   feeding supplement (OSMOLITE 1.5 CAL)  1,000 mL Per Tube Q24H   feeding supplement (PROSource TF20)  60 mL Per Tube BID   folic acid  1 mg Oral Daily   free water  200 mL Per Tube Q4H   insulin aspart  0-9 Units Subcutaneous Q4H   linezolid  600 mg Oral Q12H   multivitamin with minerals  1 tablet Oral Daily   nicotine  14 mg Transdermal Daily   mouth rinse  15 mL Mouth  Rinse 4 times per day   sodium chloride flush  10 mL Intravenous Q12H   thiamine  100 mg Oral Daily     HPI: Peter Kline is a 57 y.o. male with previous medical history of sickle cell anemia, tobacco use, and retinal hemorrhage presented to the hospital with altered mental status.   Peter Kline arrived to the ED with slurred speech and dysarthria after being found walking around with altered mental status. Afebrile on admission with leukocytosis with WBC count 21.6. Code stroke head CT with no acute intracranial abnormalities and chest x-ray with possibly superimposed airspace disease. MRI with no intracranial infection and subcentimeter acute infarct with small chronic infratentorial infarcts. Required intubation for airway protection. Blood cultures were without growth and TEE was negative for endocarditis. Began having intermittent fevers on 11/4 with increasing leukocytosis. Initially received meningitis coverage which was de-escalated to Unasyn and then received Pip/tazo and vancomycin starting on 11/5 ultimately narrowed to vancomycin on 11/8 with concern for aspiration pneumonia and previous concern for pneumonia.  Fever curve is now down trending with improving leukocytosis with WBC count down to 19.8. Blood cultures from 11/8 were without growth. Respiratory culture with MRSE. ID has been asked for antibiotic recommendations.   Peter Kline is feeling better and continues to have supplemental oxygen via nasal cannula. Denies any current shortness of breath. Does have a Coretrack present as well. Family at bedside.     Review of Systems: Review of Systems  Constitutional:  Positive for malaise/fatigue. Negative for chills, fever and weight loss.  Respiratory:  Negative for cough, shortness of breath and wheezing.   Cardiovascular:  Negative for chest pain and leg swelling.  Gastrointestinal:  Negative for abdominal pain, constipation, diarrhea, nausea and vomiting.  Skin:  Negative for  rash.     Past Medical History:  Diagnosis Date   Shortness of breath    Sickle cell anemia (HCC)    Past Surgical History:  Procedure Laterality Date   LAPAROSCOPIC GASTROTOMY W/ REPAIR OF ULCER      Social History   Tobacco Use   Smoking status: Every Day    Current packs/day: 1.00    Types: Cigarettes   Smokeless tobacco: Never  Vaping Use   Vaping status: Never Used  Substance Use Topics   Alcohol use: Yes    Alcohol/week: 6.0 standard drinks of alcohol    Types: 6 Cans of beer per week    Comment: most days   Drug use: Yes    Types: Marijuana    Comment: occ    Family History  Problem Relation Age of Onset   Diabetes Mother    Hypertension Mother     No Known Allergies  OBJECTIVE: Blood pressure (!) 128/99, pulse 74, temperature 98.2 F (36.8 C), temperature source Oral, resp. rate 16, height 6' 0.01" (1.829 m), weight 77.9 kg, SpO2 98%.  Physical Exam Constitutional:      General: He is not in acute distress.    Appearance: He is well-developed.     Interventions: Nasal cannula in place.  Cardiovascular:     Rate and Rhythm: Normal rate and regular rhythm.     Heart sounds: Normal heart sounds.  Pulmonary:     Effort: Pulmonary effort is normal.     Breath sounds: Normal breath sounds.  Skin:    General: Skin is warm and dry.  Neurological:     Mental Status: He is alert and oriented to person, place, and time.  Psychiatric:        Behavior: Behavior normal.        Thought Content: Thought content normal.        Judgment: Judgment normal.     Lab Results Lab Results  Component Value Date   WBC 19.8 (H) 02/26/2023   HGB 9.4 (L) 02/26/2023   HCT 26.7 (L) 02/26/2023   MCV 74.4 (L) 02/26/2023   PLT 459 (H) 02/26/2023    Lab Results  Component Value Date   CREATININE 0.77 02/25/2023   BUN 17 02/25/2023   NA 138 02/25/2023   K 3.9 02/25/2023   CL 106 02/25/2023   CO2 22 02/25/2023    Lab Results  Component Value Date   ALT 47 (H)  02/17/2023   AST 35 02/17/2023   ALKPHOS 123 02/17/2023   BILITOT 1.0 02/17/2023     Microbiology: Recent Results (from the past 240 hour(s))  Culture, blood (Routine X 2) w Reflex to ID Panel     Status: None   Collection Time: 02/16/23  8:03 PM   Specimen: BLOOD LEFT HAND  Result Value Ref Range Status   Specimen Description BLOOD LEFT HAND  Final   Special Requests   Final    BOTTLES DRAWN AEROBIC AND ANAEROBIC Blood Culture results may not be optimal due to an excessive volume of blood received in culture bottles   Culture   Final    NO GROWTH 5 DAYS Performed at [facility], [address], [place], [place] [address]    Report Status 02/21/2023 FINAL  Final  Culture, blood (Routine X 2) w Reflex to ID Panel     Status: None   Collection Time: 02/16/23  8:09 PM   Specimen: BLOOD LEFT ARM  Result Value Ref Range Status   Specimen Description BLOOD LEFT ARM  Final   Special Requests   Final    BOTTLES DRAWN AEROBIC AND ANAEROBIC Blood Culture results may not be optimal due to an excessive volume of blood received in culture bottles   Culture   Final    NO GROWTH 5 DAYS Performed at [facility], [address] [place], [place] [address]    Report Status 02/21/2023 FINAL  Final  CSF culture w Gram Stain     Status: None   Collection Time: 02/17/23  2:35 PM   Specimen: PATH Cytology CSF; Cerebrospinal Fluid  Result Value Ref Range Status   Specimen Description CSF  Final   Special Requests NONE  Final   Gram Stain NO WBC SEEN NO ORGANISMS SEEN CYTOSPIN SMEAR   Final   Culture   Final    NO GROWTH 3 DAYS Performed at [facility], [address] [place], [place] [address]    Report Status 02/21/2023 FINAL  Final  Surgical PCR screen     Status: Abnormal   Collection Time: 02/17/23  7:47 PM   Specimen: Nasal Mucosa; Nasal Swab  Result Value Ref Range Status   MRSA, PCR (A) NEGATIVE Final    INVALID, UNABLE TO DETERMINE THE PRESENCE OF TARGET DUE  TO SPECIMEN INTEGRITY. RECOLLECTION REQUESTED.    Comment: RESULT CALLED TO, READ BACK BY AND VERIFIED WITH: RN C. RUECKEL [id number] @2201  FH    Staphylococcus aureus (A) NEGATIVE Final    INVALID, UNABLE TO DETERMINE THE PRESENCE OF TARGET DUE TO SPECIMEN INTEGRITY. RECOLLECTION REQUESTED.    Comment: Performed at [facility], [address], [place], [place] [address]  Surgical PCR screen     Status: None   Collection Time: 02/17/23 10:10 PM   Specimen: Nasal Mucosa; Nasal Swab  Result Value Ref Range Status   MRSA, PCR NEGATIVE NEGATIVE Final   Staphylococcus aureus NEGATIVE NEGATIVE Final    Comment: (NOTE) The Xpert SA Assay (FDA approved for NASAL specimens in patients 54 years of age and older), is one component of a comprehensive surveillance program. It is not intended to diagnose infection nor to guide or monitor treatment. Performed at [facility], [address], [place], [place] [address]   Culture, Respiratory w Gram Stain     Status: None   Collection Time: 02/18/23  7:09 AM   Specimen: Tracheal Aspirate; Respiratory  Result Value Ref Range Status   Specimen Description TRACHEAL ASPIRATE  Final   Special Requests NONE  Final   Gram Stain   Final    RARE WBC PRESENT,BOTH PMN AND MONONUCLEAR NO ORGANISMS SEEN Performed at [facility], [address], [place], [place] [address]  Culture MODERATE STAPHYLOCOCCUS EPIDERMIDIS  Final   Report Status 02/20/2023 FINAL  Final   Organism ID, Bacteria STAPHYLOCOCCUS EPIDERMIDIS  Final      Susceptibility   Staphylococcus epidermidis - MIC*    CIPROFLOXACIN >=8 RESISTANT Resistant     ERYTHROMYCIN >=8 RESISTANT Resistant     GENTAMICIN <=0.5 SENSITIVE Sensitive     OXACILLIN >=4 RESISTANT Resistant     TETRACYCLINE <=1 SENSITIVE Sensitive     VANCOMYCIN 2 SENSITIVE Sensitive     TRIMETH/SULFA <=10 SENSITIVE Sensitive     CLINDAMYCIN <=0.25 SENSITIVE Sensitive     RIFAMPIN <=0.5 SENSITIVE Sensitive      Inducible Clindamycin NEGATIVE Sensitive     * MODERATE STAPHYLOCOCCUS EPIDERMIDIS  Urine Culture     Status: None   Collection Time: 02/21/23  9:47 AM   Specimen: Urine, Clean Catch  Result Value Ref Range Status   Specimen Description URINE, CLEAN CATCH  Final   Special Requests NONE  Final   Culture   Final    NO GROWTH Performed at [facility], [address], [place], [place] [address]    Report Status 02/22/2023 FINAL  Final  Culture, blood (Routine X 2) w Reflex to ID Panel     Status: None   Collection Time: 02/21/23  1:21 PM   Specimen: BLOOD RIGHT ARM  Result Value Ref Range Status   Specimen Description BLOOD RIGHT ARM  Final   Special Requests   Final    BOTTLES DRAWN AEROBIC AND ANAEROBIC Blood Culture results may not be optimal due to an inadequate volume of blood received in culture bottles   Culture   Final    NO GROWTH 5 DAYS Performed at [facility], [address] [place], [place] [address]    Report Status 02/26/2023 FINAL  Final  Culture, blood (Routine X 2) w Reflex to ID Panel     Status: None   Collection Time: 02/21/23  1:24 PM   Specimen: BLOOD RIGHT WRIST  Result Value Ref Range Status   Specimen Description BLOOD RIGHT WRIST  Final   Special Requests   Final    BOTTLES DRAWN AEROBIC AND ANAEROBIC Blood Culture results may not be optimal due to an inadequate volume of blood received in culture bottles   Culture   Final    NO GROWTH 5 DAYS Performed at [facility], [address], [place], [place] [address]    Report Status 02/26/2023 FINAL  Final   DG Swallowing Func-Speech Pathology  Result Date: 02/24/2023 Table formatting from the original result was not included. Modified Barium Swallow Study Patient Details Name: Peter Kline MRN: [id number] Date of Birth: 1965-06-23 Today's Date: 02/24/2023 HPI/PMH: HPI: Charleton Fera is a 57 year old male with presented to the emergency department on 10/30 with altered mental status.  Was reportedly found wandering streets of [place] altered.  Pt with respiratory failure and intubated 10/30-11/6.  MRI 10/31 negative.  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 Pt with past medical history of sickle cell c/b priapism, GERD, chronic pain, retinal hemorrhage, tobacco use, marijuana use, vitamin D deficiency Clinical Impression: Clinical Impression: Pt presents with a mild-moderate oral and a mild pharyngeal dysphagia per results of MBSS completed today. Of note, pt received multiple sedating medication prior to [facility] and he was much more  lethargic compared to earlier bedside assessment today. MBSS trials were limited to thin liquids and pudding due to his lethargy.  Anticipate the majority of his swallowing deficits observed today are related to his current lethargic state.      Oral deficits characterized by poor oral acceptance and initiation (likely in the setting of lethargy), reduce labial resulting in moderate anterior spillage of thin liquids by spoon and cup. Pt unable to initiate use of straw. There was prolonged oral prep and transit of pudding trial with verbal cues needed to improve oral transit time. There was reduce oral coordination with liquids resulting in posterior spillage of thin liquids to the level of the pyriform sinuses before the swallow.      Pharyngeal deficits characterized by briefly delayed pharyngeal swallow initiation, reduced base of tongue retraction, reduced laryngeal vestibule closure, and reduced pharyngeal stripping.      Findings:   -No aspiration observed across PO trials on MBSS.   -There was x1 instance of trace, transient penetration of thin liquids by cup before/during the swallow. Penetrated liquid residue was ejected during the swallow.   -There was a min-mod amount of  vallecular residue following pudding trials, which was reduced with a subsequent liquid wash.      Recommend initiation of a full liquids, thin liquid diet with strict adherence to aspiration precautions. Recommend only offering PO options if pt is fully awake/alert. Anticipate pt's diet will be able to be advanced at bedside per SLP with ongoing assessment. Factors that may increase risk of adverse event in presence of aspiration Rubye Oaks & Clearance Coots 2021): Factors that may increase risk of adverse event in presence of aspiration Rubye Oaks & Clearance Coots 2021): Dependence for feeding and/or oral hygiene; Reduced cognitive function; Frail or deconditioned Recommendations/Plan: Swallowing Evaluation Recommendations Swallowing Evaluation Recommendations Recommendations: PO diet PO Diet Recommendation: Full liquid diet; Thin liquids (Level 0) Liquid Administration via: Cup; Straw Medication Administration: Crushed with puree Supervision: Full supervision/cueing for swallowing strategies; Full assist for feeding Swallowing strategies  : Small bites/sips; Slow rate Postural changes: Position pt fully upright for meals; Stay upright 30-60 min after meals Oral care recommendations: Oral care BID (2x/day) Treatment Plan Treatment Plan Treatment recommendations: Therapy as outlined in treatment plan below Follow-up recommendations: Skilled nursing-short term rehab (<3 hours/day) Functional status assessment: Patient has had a recent decline in their functional status and demonstrates the ability to make significant improvements in function in a reasonable and predictable amount of time. Treatment duration: 2 weeks Interventions: Aspiration precaution training; Patient/family education; Trials of upgraded texture/liquids; Diet toleration management by SLP Recommendations Recommendations for follow up therapy are one component of a multi-disciplinary discharge planning process, led by the attending physician.  Recommendations may be  updated based on patient status, additional functional criteria and insurance authorization. Assessment: Orofacial Exam: Orofacial Exam Oral Cavity - Dentition: Missing dentition; Poor condition Orofacial Anatomy: WFL Oral Motor/Sensory Function: Unable to test Anatomy: Anatomy: WFL Boluses Administered: Boluses Administered Boluses Administered: Thin liquids (Level 0); Puree  Oral Impairment Domain: Oral Impairment Domain Tongue control during bolus hold: Posterior escape of less than half of bolus Bolus preparation/mastication: -- (not tested) Bolus transport/lingual motion: Slow tongue motion Oral residue: Trace residue lining oral structures Location of oral residue : Tongue Initiation of pharyngeal swallow : Valleculae  Pharyngeal Impairment Domain: Pharyngeal Impairment Domain Soft palate elevation: No bolus between soft palate (SP)/pharyngeal wall (PW) Laryngeal elevation: Complete superior movement of thyroid cartilage with complete approximation of arytenoids to epiglottic petiole Anterior hyoid  excursion: Complete anterior movement Epiglottic movement: Complete inversion Laryngeal vestibule closure: Incomplete, narrow column air/contrast in laryngeal vestibule Pharyngeal stripping wave : Present - diminished Pharyngeal contraction (A/P view only): N/A Pharyngoesophageal segment opening: Complete distension and complete duration, no obstruction of flow Tongue base retraction: Narrow column of contrast or air between tongue base and PPW Pharyngeal residue: Collection of residue within or on pharyngeal structures Location of pharyngeal residue: Valleculae  Esophageal Impairment Domain: Esophageal Impairment Domain Esophageal clearance upright position: Esophageal retention (brief with clearance) Pill: Pill Consistency administered: -- (not tested) Penetration/Aspiration Scale Score: Penetration/Aspiration Scale Score 1.  Material does not enter airway: Thin liquids (Level 0); Puree 2.  Material enters airway,  remains ABOVE vocal cords then ejected out: Mildly thick liquids (Level 2, nectar thick) Compensatory Strategies: Compensatory Strategies Compensatory strategies: Yes Liquid wash: Effective Effective Liquid Wash: Puree   General Information: Caregiver present: No  Diet Prior to this Study: NPO   Temperature : Febrile   Respiratory Status: WFL   Supplemental O2: Nasal cannula (2L)   History of Recent Intubation: Yes  Behavior/Cognition: Confused; Requires cueing; Lethargic/Drowsy Self-Feeding Abilities: Dependent for feeding No data recorded Volitional Cough: Unable to elicit Volitional Swallow: Unable to elicit Exam Limitations: Poor participation; Fatigue Goal Planning: Prognosis for improved oropharyngeal function: Fair Barriers to Reach Goals: Cognitive deficits; Motivation; Behavior; Medication No data recorded Patient/Family Stated Goal: drink a soda Consulted and agree with results and recommendations: Patient; Nurse Pain: Pain Assessment Pain Assessment: No/denies pain Breathing: 0 Negative Vocalization: 0 Facial Expression: 0 Body Language: 0 Consolability: 0 PAINAD Score: 0 End of Session: Start Time:SLP Start Time (ACUTE ONLY): 1142 Stop Time: SLP Stop Time (ACUTE ONLY): 1155 Time Calculation:SLP Time Calculation (min) (ACUTE ONLY): 13 min Charges: No data recorded SLP visit diagnosis: SLP Visit Diagnosis: Dysphagia, oropharyngeal phase (R13.12) Past Medical History: Past Medical History: Diagnosis Date  Shortness of breath   Sickle cell anemia (HCC)  Past Surgical History: Past Surgical History: Procedure Laterality Date  LAPAROSCOPIC GASTROTOMY W/ REPAIR OF ULCER   Ellery Plunk 02/24/2023, 11:36 AM  CT CHEST ABDOMEN PELVIS W CONTRAST  Result Date: 02/21/2023 CLINICAL DATA:  Sepsis EXAM: CT CHEST, ABDOMEN, AND PELVIS WITH CONTRAST TECHNIQUE: Multidetector CT imaging of the chest, abdomen and pelvis was performed following the standard protocol during bolus administration of intravenous contrast.  RADIATION DOSE REDUCTION: This exam was performed according to the departmental dose-optimization program which includes automated exposure control, adjustment of the mA and/or kV according to patient size and/or use of iterative reconstruction technique. CONTRAST:  80mL OMNIPAQUE IOHEXOL 350 MG/ML SOLN COMPARISON:  CT chest abdomen and pelvis 12/25/2020 FINDINGS: CT CHEST FINDINGS Cardiovascular: No significant vascular findings. Normal heart size. No pericardial effusion. Mediastinum/Nodes: Enteric tube is seen throughout the esophagus. Visualized thyroid gland is within normal limits. There are diffusely prominent mediastinal and hilar lymph nodes. Lungs/Pleura: There is a trace left pleural effusion. There is patchy airspace consolidation within the bilateral lower lobes, left greater than right and minimally within the right upper lobe. There is no pneumothorax. There secretions in the distal trachea and left mainstem bronchus. Musculoskeletal: No chest wall mass or suspicious bone lesions identified. CT ABDOMEN PELVIS FINDINGS Hepatobiliary: No focal liver abnormality is seen. Small gallstones are again seen. No gallbladder wall thickening, or biliary dilatation. Pancreas: Unremarkable. No pancreatic ductal dilatation or surrounding inflammatory changes. Spleen: The spleen is small in size and diffusely calcified, unchanged. Adrenals/Urinary Tract: There are scattered rounded hypodensities in the left kidney which  are too small to characterize, likely cysts. There is no hydronephrosis or perinephric stranding. The adrenal glands and bladder are within normal limits. Stomach/Bowel: Appendix appears normal. No evidence of bowel wall thickening, distention, or inflammatory changes. There are scattered air-fluid levels throughout the colon. Rectal tube in place. Enteric tube tip is in the gastric antrum. Vascular/Lymphatic: Aortic atherosclerosis. No enlarged abdominal or pelvic lymph nodes. Reproductive: Prostate  is unremarkable. Other: No abdominal wall hernia or abnormality. No abdominopelvic ascites. Musculoskeletal: Punctate radiopaque densities are seen in the soft tissues in the right gluteal region and minimally in the left gluteal region, similar to prior study. No acute fracture. Mild chronic compression deformity of T12 is unchanged. IMPRESSION: 1. Patchy airspace consolidation in the bilateral lower lobes, left greater than right and minimally in the right upper lobe worrisome for multifocal pneumonia. 2. Trace left pleural effusion. 3. Secretions in the distal trachea and left mainstem bronchus. 4. Diffusely prominent mediastinal and hilar lymph nodes, likely reactive. 5. Cholelithiasis. 6. Scattered air-fluid levels throughout the colon compatible with diarrheal illness. 7. Stable punctate radiopaque densities in the soft tissues of the right gluteal region and minimally in the left gluteal region, similar to prior study. 8. Subcentimeter left Bosniak II renal cyst, too small to characterize. No follow-up imaging is recommended. JACR 2018 Feb; 264-273, Management of the Incidental Renal Mass on CT, [employer] 2021; 814-848, Bosniak Classification of Cystic Renal Masses, Version 2019. Aortic Atherosclerosis (ICD10-I70.0). Electronically Signed   By: Darliss Cheney M.D.   On: 02/21/2023 18:05   VAS [place] LOWER EXTREMITY VENOUS (DVT)  Result Date: 02/19/2023  Lower Venous DVT Study Patient Name:  Peter Kline  Date of Exam:   02/18/2023 Medical Rec #: [id number]        Accession #:    [id number] Date of Birth: [birth year]        Patient Gender: M Patient Age:   39 years Exam Location:  [facility] Procedure:      VAS [place] LOWER EXTREMITY VENOUS (DVT) Referring Phys: Levon Hedger --------------------------------------------------------------------------------  Indications: Stroke.  Comparison Study: No prior study. Performing Technologist: Fernande Bras  Examination Guidelines: A complete evaluation  includes B-mode imaging, spectral Doppler, color Doppler, and power Doppler as needed of all accessible portions of each vessel. Bilateral testing is considered an integral part of a complete examination. Limited examinations for reoccurring indications may be performed as noted. The reflux portion of the exam is performed with the patient in reverse Trendelenburg.  +---------+---------------+---------+-----------+----------+-----------------+ RIGHT    CompressibilityPhasicitySpontaneityPropertiesThrombus Aging    +---------+---------------+---------+-----------+----------+-----------------+ CFV      Full           Yes      Yes                                    +---------+---------------+---------+-----------+----------+-----------------+ SFJ      Full                                                           +---------+---------------+---------+-----------+----------+-----------------+ FV Prox  Full                                                           +---------+---------------+---------+-----------+----------+-----------------+  FV Mid   Full                                                           +---------+---------------+---------+-----------+----------+-----------------+ FV DistalFull                                                           +---------+---------------+---------+-----------+----------+-----------------+ PFV      Full                                                           +---------+---------------+---------+-----------+----------+-----------------+ POP      Full           Yes      Yes                                    +---------+---------------+---------+-----------+----------+-----------------+ PTV      None           No       No                   Age Indeterminate +---------+---------------+---------+-----------+----------+-----------------+ PERO     None           No       No                   Age Indeterminate  +---------+---------------+---------+-----------+----------+-----------------+   +---------+---------------+---------+-----------+----------+--------------+ LEFT     CompressibilityPhasicitySpontaneityPropertiesThrombus Aging +---------+---------------+---------+-----------+----------+--------------+ CFV      Full           Yes      Yes                                 +---------+---------------+---------+-----------+----------+--------------+ SFJ      Full                                                        +---------+---------------+---------+-----------+----------+--------------+ FV Prox  Full                                                        +---------+---------------+---------+-----------+----------+--------------+ FV Mid   Full                                                        +---------+---------------+---------+-----------+----------+--------------+ FV DistalFull                                                        +---------+---------------+---------+-----------+----------+--------------+  PFV      Full                                                        +---------+---------------+---------+-----------+----------+--------------+ POP      Full           Yes      Yes                                 +---------+---------------+---------+-----------+----------+--------------+ PTV      Full                                                        +---------+---------------+---------+-----------+----------+--------------+ PERO     None           No       No                   Acute          +---------+---------------+---------+-----------+----------+--------------+     Summary: BILATERAL: -No evidence of popliteal cyst, bilaterally. RIGHT: - Findings consistent with age indeterminate deep vein thrombosis involving the right posterior tibial veins, and right peroneal veins.   LEFT: - Findings consistent with acute deep vein  thrombosis involving the left posterior tibial veins, and left peroneal veins.   *See table(s) above for measurements and observations. Electronically signed by Coral Else MD on 02/19/2023 at 9:26:31 PM.    Final    DG Abd Portable 1V  Result Date: 02/19/2023 CLINICAL DATA:  Feeding tube placement. EXAM: PORTABLE ABDOMEN - 1 VIEW COMPARISON:  Same day. FINDINGS: Distal tip of feeding tube is seen in expected position of distal stomach. IMPRESSION: Distal tip of feeding tube is seen in expected position of distal stomach. Electronically Signed   By: Lupita Raider M.D.   On: 02/19/2023 12:24   DG CHEST PORT 1 VIEW  Result Date: 02/19/2023 CLINICAL DATA:  Pneumonia.  Intubated. EXAM: PORTABLE CHEST 1 VIEW COMPARISON:  Chest x-ray dated February 15, 2023. FINDINGS: Endotracheal tube tip 5 cm above the carina. Enteric tube within the stomach. The heart size and mediastinal contours are within normal limits. Basilar and peripheral predominant patchy airspace opacities in both lungs have improved over the past 4 days. No pleural effusion or pneumothorax. No acute osseous abnormality. IMPRESSION: 1. Improving multifocal pneumonia. Electronically Signed   By: Obie Dredge M.D.   On: 02/19/2023 10:32   DG Abd Portable 1V  Result Date: 02/19/2023 CLINICAL DATA:  Enteric tube placement. EXAM: PORTABLE ABDOMEN - 1 VIEW COMPARISON:  Abdominal x-ray from yesterday. FINDINGS: Enteric tube remains appropriately positioned in the stomach. Visualized bowel gas pattern is normal. Patchy airspace disease in both lung bases, not significantly changed. IMPRESSION: 1. Enteric tube appropriately positioned in the stomach. Electronically Signed   By: Obie Dredge M.D.   On: 02/19/2023 10:31   EEG adult  Result Date: 02/19/2023 Charlsie Quest, MD     02/19/2023  8:13 AM Patient Name: Peter Kline MRN: [id number] Epilepsy Attending: Charlsie Quest Referring Physician/Provider: Lorin Glass, MD Date: 02/18/2023  Duration: 24.35 mins Patient history:  57yo M with ams getting eeg to evaluate for seizure  Level of alertness:  comatose  AEDs during EEG study: Propofol, Phenobarb  Technical aspects: This EEG study was done with scalp electrodes positioned according to the 10-20 International system of electrode placement. Electrical activity was reviewed with band pass filter of 1-70Hz , sensitivity of 7 uV/mm, display speed of 32mm/sec with a 60Hz  notched filter applied as appropriate. EEG data were recorded continuously and digitally stored.  Video monitoring was available and reviewed as appropriate.  Description: EEG showed continuous generalized predominantly 5 to 6 Hz theta slowing admixed with intermittent 2-3hz  delta slowing. Hyperventilation and photic stimulation were not performed.    ABNORMALITY - Continuous slow, generalized  IMPRESSION: This study is suggestive of moderate to severe diffuse encephalopathy. No seizures or epileptiform discharges were seen throughout the recording.  Charlsie Quest  DG Abd 1 View  Result Date: 02/18/2023 CLINICAL DATA:  Confirm OG tube placement. EXAM: ABDOMEN - 1 VIEW COMPARISON:  02/17/2023. FINDINGS: The bowel gas pattern is normal. An enteric tube terminates in the stomach and appears appropriate in position. Patchy airspace disease is present in the lungs bilaterally and increased from the prior exam. IMPRESSION: 1. OG tube terminates in the stomach and appears appropriate in position. 2. Nonobstructive bowel-gas pattern. 3. Patchy airspace disease at the lung bases, increased from the prior exam, possible edema or multifocal pneumonia. Electronically Signed   By: Thornell Sartorius M.D.   On: 02/18/2023 21:43   ECHO TEE  Result Date: 02/18/2023    TRANSESOPHOGEAL ECHO REPORT   Patient Name:   Peter Kline Date of Exam: 02/18/2023 Medical Rec #:  [id number]       Height:       72.0 in Accession #:    [id number]      Weight:       157.2 lb Date of Birth:  [birth year]       BSA:           1.923 m Patient Age:    57 years        BP:           108/64 mmHg Patient Gender: M               HR:           94 bpm. Exam Location:  Inpatient Procedure: Transesophageal Echo, Cardiac Doppler and Color Doppler Indications:     Endocarditis  History:         Patient has prior history of Echocardiogram examinations, most                  recent 02/16/2023. Stroke.  Sonographer:     Darlys Gales Referring Phys:  [id number] Perlie Gold Diagnosing Phys: Wilfred Lacy PROCEDURE: After discussion of the risks and benefits of a TEE, an informed consent was obtained from a family member. The transesophogeal probe was passed without difficulty through the esophogus of the patient. Sedation performed by different physician. The patient developed no complications during the procedure.  IMPRESSIONS  1. Left ventricular ejection fraction, by estimation, is 55 to 60%. The left ventricle has normal function. The left ventricle has no regional wall motion abnormalities.  2. Right ventricular systolic function is normal. The right ventricular size is normal. Tricuspid regurgitation signal is inadequate for assessing PA pressure.  3. Left atrial size was mildly dilated. No left atrial/left atrial appendage thrombus was detected.  4. The mitral  valve is normal in structure. Trivial mitral valve regurgitation. No evidence of mitral stenosis.  5. The aortic valve is tricuspid. Aortic valve regurgitation is not visualized. No aortic stenosis is present.  6. Bubble study positive suggesting PFO. FINDINGS  Left Ventricle: Left ventricular ejection fraction, by estimation, is 55 to 60%. The left ventricle has normal function. The left ventricle has no regional wall motion abnormalities. The left ventricular internal cavity size was normal in size. There is  no left ventricular hypertrophy. Right Ventricle: The right ventricular size is normal. No increase in right ventricular wall thickness. Right ventricular systolic function  is normal. Tricuspid regurgitation signal is inadequate for assessing PA pressure. Left Atrium: Left atrial size was mildly dilated. No left atrial/left atrial appendage thrombus was detected. Right Atrium: Right atrial size was normal in size. Pericardium: There is no evidence of pericardial effusion. Mitral Valve: The mitral valve is normal in structure. Trivial mitral valve regurgitation. No evidence of mitral valve stenosis. Tricuspid Valve: The tricuspid valve is normal in structure. Tricuspid valve regurgitation is trivial. Aortic Valve: The aortic valve is tricuspid. Aortic valve regurgitation is not visualized. No aortic stenosis is present. Pulmonic Valve: The pulmonic valve was normal in structure. Pulmonic valve regurgitation is trivial. Aorta: The aortic root and ascending aorta are structurally normal, with no evidence of dilitation. IAS/Shunts: Bubble study positive suggesting PFO. [employer] Electronically signed by Wilfred Lacy Signature Date/Time: 02/18/2023/2:44:53 PM    Final    DG FL GUIDED LUMBAR PUNCTURE  Result Date: 02/17/2023 CLINICAL DATA:  Provided history: Meningitis. EXAM: DIAGNOSTIC LUMBAR PUNCTURE UNDER FLUOROSCOPIC GUIDANCE COMPARISON:  CT chest/abdomen/pelvis 12/25/2020 FLUOROSCOPY: Radiation Exposure Index (as provided by the fluoroscopic device): 7.90 mGy Kerma PROCEDURE: Due to the patient's altered mental status, informed consent was obtained the patient's uncle Elgie Congo) via telephone prior to the procedure. This process included a discussion of procedure risk. The patient was positioned prone on the fluoroscopy table. An appropriate skin entry site was determined under fluoroscopy and marked. A time-out was performed. The operator donned sterile gloves and a mask. The lower back was prepped and draped in the usual sterile fashion. Local anesthesia was provided with 1% lidocaine. Under intermittent fluoroscopy, lumbar puncture was performed at the L4-L5 level  using a 20 gauge spinal needle with return of clear/colorless CSF. 12.5 mL of CSF were collected for laboratory studies. The inner stylet was replaced within the needle and the needle was removed in its entirety. A dressing was applied at the skin entry site. No immediate post-procedure complication was apparent. IMPRESSION: 1. Fluoroscopically-guided L4-L5 lumbar puncture. 2. 12.5 mL of CSF collected and sent for laboratory studies. 3. No immediate post-procedure complication. Electronically Signed   By: Jackey Loge D.O.   On: 02/17/2023 15:08   DG Abd Portable 1V  Result Date: 02/17/2023 CLINICAL DATA:  Feeding tube placement. EXAM: PORTABLE ABDOMEN - 1 VIEW COMPARISON:  Radiograph 02/15/2023 FINDINGS: The previous enteric tube has been removed. There is a new weighted enteric tube with tip in the right upper quadrant in the region of the distal stomach. Nonobstructive upper abdominal bowel gas pattern. IMPRESSION: Weighted enteric tube with tip in the region of the distal stomach. Electronically Signed   By: Narda Rutherford M.D.   On: 02/17/2023 15:04   CT ANGIO HEAD NECK W WO CM  Result Date: 02/16/2023 CLINICAL DATA:  Follow-up examination for stroke. EXAM: CT ANGIOGRAPHY HEAD AND NECK WITH AND WITHOUT CONTRAST TECHNIQUE: Multidetector CT imaging of the head and  neck was performed using the standard protocol during bolus administration of intravenous contrast. Multiplanar CT image reconstructions and MIPs were obtained to evaluate the vascular anatomy. Carotid stenosis measurements (when applicable) are obtained utilizing NASCET criteria, using the distal internal carotid diameter as the denominator. RADIATION DOSE REDUCTION: This exam was performed according to the departmental dose-optimization program which includes automated exposure control, adjustment of the mA and/or kV according to patient size and/or use of iterative reconstruction technique. CONTRAST:  75mL OMNIPAQUE IOHEXOL 350 MG/ML SOLN  COMPARISON:  Prior brain MRI from earlier the same day. FINDINGS: CT HEAD FINDINGS Brain: Cerebral volume within normal limits. Previously identified scattered small volume infarcts involving the bilateral cerebral and cerebellar hemispheres are not well visualized by CT. No other acute large vessel territory infarct. No acute intracranial hemorrhage. No mass lesion or midline shift. No hydrocephalus or extra-axial fluid collection. Vascular: No abnormal hyperdense vessel. Skull: Scalp soft tissues demonstrate no acute finding. Calvarium intact. Sinuses/Orbits: Globes and orbital soft tissues within normal limits. Moderate mucosal thickening present throughout the sphenoid ethmoidal and left maxillary sinus. No significant mastoid effusion. Patient is intubated. Other: None. Review of the MIP images confirms the above findings CTA NECK FINDINGS Aortic arch: Visualized aortic arch within normal limits for caliber with standard branch pattern. No stenosis about the origin the great vessels. Right carotid system: Right common and internal carotid arteries are patent without dissection. Minimal plaque about the right carotid bulb without stenosis. Left carotid system: Left common and internal carotid arteries are patent without dissection. Minimal plaque about the left carotid bulb without stenosis. Vertebral arteries: Both vertebral arteries arise from subclavian arteries. No proximal subclavian artery stenosis. Vertebral arteries are patent without stenosis or dissection. Left vertebral artery slightly dominant. Skeleton: No discrete or worrisome osseous lesions. Other neck: Endotracheal and enteric tubes in place. Prominent left posterior chain lymph nodes noted, largest of which measures 1.5 cm (series 13, image 109), indeterminate. No other acute abnormality. Upper chest: Scattered patchy opacities within the partially visualized left lung, suspicious for pneumonia. Mildly prominent mediastinal lymph nodes measure  up to 1.2 cm. Review of the MIP images confirms the above findings CTA HEAD FINDINGS Anterior circulation: Mild atheromatous change about the carotid siphons without hemodynamically significant stenosis. A1 segments patent bilaterally. Normal anterior communicating artery complex. Anterior cerebral arteries patent without stenosis. No M1 stenosis or occlusion. No proximal MCA branch occlusion or high-grade stenosis. Distal MCA branches perfused and symmetric. Posterior circulation: Both V4 segments patent without stenosis. Both PICA grossly patent at their origins. Basilar patent without stenosis. Superior cerebellar and posterior cerebral arteries patent bilaterally. Venous sinuses: Grossly patent allowing for timing the contrast bolus. Anatomic variants: None significant.  No aneurysm. Review of the MIP images confirms the above findings IMPRESSION: CT HEAD: 1. Previously identified scattered small volume infarcts involving the bilateral cerebral and cerebellar hemispheres not well visualized by CT. 2. No other acute intracranial abnormality. CTA HEAD AND NECK: 1. Negative CTA for large vessel occlusion or other emergent finding. 2. Mild for age atheromatous change about the carotid bifurcations and carotid siphons without hemodynamically significant stenosis. 3. Scattered patchy opacities within the partially visualized left lung, suspicious for pneumonia. 4. Mildly enlarged mediastinal and left posterior chain lymph nodes, indeterminate, but could be reactive. Electronically Signed   By: Rise Mu M.D.   On: 02/16/2023 22:53   ECHOCARDIOGRAM COMPLETE  Result Date: 02/16/2023    ECHOCARDIOGRAM REPORT   Patient Name:   Peter Kline Date of  Exam: 02/16/2023 Medical Rec #:  [id number]       Height:       72.0 in Accession #:    [id number]      Weight:       165.6 lb Date of Birth:  [birth year]       BSA:          1.966 m Patient Age:    57 years        BP:           116/74 mmHg Patient Gender: M                HR:           65 bpm. Exam Location:  Inpatient Procedure: 2D Echo, Color Doppler and Cardiac Doppler STAT ECHO Indications:    stroke  History:        Patient has no prior history of Echocardiogram examinations.                 Sepsis.; Signs/Symptoms:Altered Mental Status.  Sonographer:    Delcie Roch RDCS Referring Phys: [id number] SRISHTI L BHAGAT  Sonographer Comments: Echo performed with patient supine and on artificial respirator. IMPRESSIONS  1. Left ventricular ejection fraction, by estimation, is 55 to 60%. The left ventricle has normal function. The left ventricle has no regional wall motion abnormalities. Left ventricular diastolic parameters are consistent with Grade II diastolic dysfunction (pseudonormalization).  2. RV-RA gradient 33 mmHg suggesting at least mild to moderate increase in RVSP depending on CVP. Right ventricular systolic function is normal. The right ventricular size is normal.  3. Left atrial size was moderately dilated.  4. The mitral valve is abnormal, mildly calcified with somewhat restricted posterior leaflet. Mild to moderate mitral valve regurgitation.  5. The aortic valve is tricuspid. Aortic valve regurgitation is not visualized.  6. The inferior vena cava is normal in size with <50% respiratory variability, suggesting right atrial pressure of 8 mmHg. Comparison(s): No prior Echocardiogram. FINDINGS  Left Ventricle: Left ventricular ejection fraction, by estimation, is 55 to 60%. The left ventricle has normal function. The left ventricle has no regional wall motion abnormalities. The left ventricular internal cavity size was normal in size. There is  no left ventricular hypertrophy. Left ventricular diastolic parameters are consistent with Grade II diastolic dysfunction (pseudonormalization). Right Ventricle: RV-RA gradient 33 mmHg suggesting at least mild to moderate increase in RVSP depending on CVP. The right ventricular size is normal. No increase in right  ventricular wall thickness. Right ventricular systolic function is normal. Left Atrium: Left atrial size was moderately dilated. Right Atrium: Right atrial size was normal in size. Pericardium: There is no evidence of pericardial effusion. Mitral Valve: The mitral valve is abnormal. There is mild calcification of the mitral valve leaflet(s). Mild to moderate mitral valve regurgitation. Tricuspid Valve: The tricuspid valve is grossly normal. Tricuspid valve regurgitation is mild. Aortic Valve: The aortic valve is tricuspid. There is mild aortic valve annular calcification. Aortic valve regurgitation is not visualized. Pulmonic Valve: The pulmonic valve was grossly normal. Pulmonic valve regurgitation is trivial. Aorta: The aortic root and ascending aorta are structurally normal, with no evidence of dilitation. Venous: IVC assessment for right atrial pressure unable to be performed due to mechanical ventilation. The inferior vena cava is normal in size with less than 50% respiratory variability, suggesting right atrial pressure of 8 mmHg. IAS/Shunts: No atrial level shunt detected by color flow Doppler.  LEFT VENTRICLE PLAX 2D LVIDd:  5.60 cm   Diastology LVIDs:         3.40 cm   LV e' medial:    7.94 cm/s LV PW:         1.00 cm   LV E/e' medial:  9.5 LV IVS:        1.00 cm   LV e' lateral:   12.40 cm/s LVOT diam:     2.30 cm   LV E/e' lateral: 6.1 LV SV:         91 LV SV Index:   46 LVOT Area:     4.15 cm  RIGHT VENTRICLE             IVC RV Basal diam:  2.80 cm     IVC diam: 2.00 cm RV S prime:     13.70 cm/s TAPSE (M-mode): 2.7 cm LEFT ATRIUM             Index        RIGHT ATRIUM           Index LA diam:        4.30 cm 2.19 cm/m   RA Area:     17.20 cm LA Vol (A2C):   95.0 ml 48.32 ml/m  RA Volume:   49.40 ml  25.13 ml/m LA Vol (A4C):   83.7 ml 42.57 ml/m LA Biplane Vol: 91.9 ml 46.74 ml/m  AORTIC VALVE LVOT Vmax:   101.00 cm/s LVOT Vmean:  66.900 cm/s LVOT VTI:    0.220 m  AORTA Ao Root diam: 3.10 cm  Ao Asc diam:  3.50 cm MITRAL VALVE                  TRICUSPID VALVE MV Area (PHT): 4.15 cm       TR Peak grad:   32.7 mmHg MV Decel Time: 183 msec       TR Vmax:        286.00 cm/s MR Peak grad:    109.4 mmHg MR Mean grad:    76.0 mmHg    SHUNTS MR Vmax:         523.00 cm/s  Systemic VTI:  0.22 m MR Vmean:        419.0 cm/s   Systemic Diam: 2.30 cm MR PISA:         1.01 cm MR PISA Eff ROA: 8 mm MR PISA Radius:  0.40 cm MV E velocity: 75.40 cm/s MV A velocity: 65.40 cm/s MV E/A ratio:  1.15 Nona Dell MD Electronically signed by Nona Dell MD Signature Date/Time: 02/16/2023/2:44:07 PM    Final    MR BRAIN W WO CONTRAST  Result Date: 02/16/2023 CLINICAL DATA:  Altered mental status EXAM: MRI HEAD WITHOUT AND WITH CONTRAST TECHNIQUE: Multiplanar, multiecho pulse sequences of the brain and surrounding structures were obtained without and with intravenous contrast. CONTRAST:  7mL GADAVIST GADOBUTROL 1 MMOL/ML IV SOLN COMPARISON:  Brain MRI 02/13/2023 FINDINGS: Brain: Again seen is small early subacute infarct in the left occipital periventricular white matter (2-23), unchanged. There are scattered new small acute infarcts in the bilateral cerebral and cerebellar hemispheres, the largest in the left corona radiata/centrum semiovale. There is no associated hemorrhage or mass effect. Findings are favored embolic in etiology. There is no acute intracranial hemorrhage or extra-axial fluid collection. Parenchymal volume is stable. The ventricles are stable in size. A small focus of encephalomalacia in the left frontal lobe, small remote cerebellar infarcts, and small remote infarct in the left pons  are unchanged. Parenchymal signal is otherwise essentially normal. The pituitary and suprasellar region are normal. There is no mass lesion or abnormal enhancement. There is no mass effect or midline shift. Vascular: Normal flow voids. Skull and upper cervical spine: Normal marrow signal. Sinuses/Orbits: There is  moderate mucosal thickening throughout the paranasal sinuses. The globes and orbits are unremarkable. Other: A midline nasopharyngeal Tornwaldt cyst is noted. IMPRESSION: Scattered small new acute infarcts in the bilateral cerebral and cerebellar hemispheres, favored embolic in etiology. Electronically Signed   By: Lesia Hausen M.D.   On: 02/16/2023 14:12   DG CHEST PORT 1 VIEW  Result Date: 02/15/2023 CLINICAL DATA:  Aspiration pneumonia.  OG tube placement EXAM: PORTABLE CHEST 1 VIEW COMPARISON:  02/14/2023 FINDINGS: Endotracheal tube tip is 1 cm above the carina. OG tube is in the stomach. Patchy bilateral airspace disease most pronounced in the lower lobes, worsening since prior study. No effusions or pneumothorax. No acute bony abnormality. IMPRESSION: Worsening patchy bilateral airspace disease, most pronounced in the lower lobes. Electronically Signed   By: Charlett Nose M.D.   On: 02/15/2023 19:26   DG Abd 1 View  Result Date: 02/15/2023 CLINICAL DATA:  OG tube placement EXAM: ABDOMEN - 1 VIEW COMPARISON:  02/12/2023 FINDINGS: OG tube tip in the fundus of the stomach. IMPRESSION: OG tube in the stomach. Electronically Signed   By: Charlett Nose M.D.   On: 02/15/2023 19:26   DG Chest Port 1 View  Result Date: 02/14/2023 CLINICAL DATA:  Respiratory failure. EXAM: PORTABLE CHEST 1 VIEW COMPARISON:  February 12, 2023. FINDINGS: Stable cardiomediastinal silhouette. Endotracheal and nasogastric tubes are in grossly good position. Stable bilateral patchy and interstitial opacities are noted. Bony thorax is unremarkable. IMPRESSION: Stable support apparatus. Stable bilateral lung opacities as noted above. Electronically Signed   By: Lupita Raider M.D.   On: 02/14/2023 10:58   Overnight EEG with video  Result Date: 02/14/2023 Charlsie Quest, MD     02/14/2023  9:50 AM Patient Name: Peter Kline MRN: [id number] Epilepsy Attending: Charlsie Quest Referring Physician/Provider: Lynnell Catalan, MD  Duration: 02/13/2023 1008 to 02/14/2023 1008 Patient history: 57yo M with ams getting eeg to evaluate for seizure Level of alertness:  comatose AEDs during EEG study: Propofol, Phenobarb Technical aspects: This EEG study was done with scalp electrodes positioned according to the 10-20 International system of electrode placement. Electrical activity was reviewed with band pass filter of 1-70Hz , sensitivity of 7 uV/mm, display speed of 37mm/sec with a 60Hz  notched filter applied as appropriate. EEG data were recorded continuously and digitally stored.  Video monitoring was available and reviewed as appropriate. Description: EEG showed continuous generalized predominantly 5 to 6 Hz theta slowing admixed with intermittent 2-3hz  delta slowing. Hyperventilation and photic stimulation were not performed.   ABNORMALITY - Continuous slow, generalized IMPRESSION: This study is suggestive of moderate to severe diffuse encephalopathy. No seizures or epileptiform discharges were seen throughout the recording. Charlsie Quest   MR BRAIN W WO CONTRAST  Result Date: 02/13/2023 CLINICAL DATA:  CNS infection suspected.  Meningitis suspected EXAM: MRI HEAD WITHOUT AND WITH CONTRAST TECHNIQUE: Multiplanar, multiecho pulse sequences of the brain and surrounding structures were obtained without and with intravenous contrast. CONTRAST:  8mL GADAVIST GADOBUTROL 1 MMOL/ML IV SOLN COMPARISON:  Head CT from 2 days ago FINDINGS: Brain: Subcentimeter infarct in the periventricular white matter around the occipital horn of the left lateral ventricle. Chronic lacunar infarct in the left paramedian pons. Tiny chronic  right cerebellar infarcts. Encephalomalacia involving the small area of the anterior left frontal cortex from uncertain remote insult. Generalized brain atrophy. No hemorrhage, hydrocephalus, mass, or collection. No evidence of subarachnoid debris by FLAIR imaging. Vascular: Normal flow voids. Skull and upper cervical spine:  Subcutaneous infiltration into the left anterior scalp, favor scarring. Sinuses/Orbits: Suspect remote blowout fracture of the medial wall left orbit. Nose and sinus opacification in the setting of intubation. IMPRESSION: 1. No evidence of intracranial infection. 2. Subcentimeter acute infarct in the left occipital white matter. 3. 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 Nonspecific small area of left frontal lobe encephalomalacia. 4. Brain atrophy. Electronically Signed   By: Tiburcio Pea M.D.   On: 02/13/2023 06:46   DG FL GUIDED LUMBAR PUNCTURE  Result Date: 02/12/2023 CLINICAL DATA:  57 year old male with AMS, leukocytosis and fever concerning for meningitis. Bedside LP was performed per unsuccessful. Request for fluoro guided lumbar puncture. EXAM: DIAGNOSTIC LUMBAR PUNCTURE UNDER FLUOROSCOPIC GUIDANCE COMPARISON:  None Available. FLUOROSCOPY: Radiation Exposure Index (as provided by the fluoroscopic device): 15.7 mGy Kerma PROCEDURE: The procedure was performed with an emergent consent due to patient's incapacity to make decisions, and inability to reach surrogate decision maker. With the patient prone, the lower back was prepped with Betadine. 1% Lidocaine was used for local anesthesia. Lumbar puncture was attempted at the L2-3, L3-4, and L5-S1 level using a 20 gauge needle, CSF could not be obtained despite optimal needle positioning. IMPRESSION: Unsuccessful attempts at fluoroscopic-guided lumbar puncture. Procedure was performed by Lawernce Ion, PA-C under supervision of Roanna Banning, MD. Electronically Signed   By: Roanna Banning M.D.   On: 02/12/2023 15:18   DG Chest Port 1 View  Result Date: 02/12/2023 CLINICAL DATA:  Endotracheal tube placement EXAM: PORTABLE CHEST 1 VIEW COMPARISON:  12/12/2022 FINDINGS: Endotracheal tube tip in the low intrathoracic trachea 1.8 cm from the carina. Enteric tube tip and side-port in the stomach. Stable cardiomediastinal silhouette. Patchy  left-greater-than-right airspace and interstitial opacities increased from prior. No pleural effusion or pneumothorax. IMPRESSION: 1. Endotracheal tube tip in the low intrathoracic trachea 1.8 cm from the carina. 2. Increased patchy left-greater-than-right airspace and interstitial opacities suspicious for pneumonia. Electronically Signed   By: Minerva Fester M.D.   On: 02/12/2023 04:07   DG Abdomen 1 View  Result Date: 02/12/2023 CLINICAL DATA:  OG tube placement EXAM: ABDOMEN - 1 VIEW COMPARISON:  None Available. FINDINGS: Enteric tube tip and side port in the stomach. The bowel gas pattern is nonobstructive. No radio-opaque calculi or other significant radiographic abnormality are seen. IMPRESSION: Enteric tube tip and side-port in the stomach. Electronically Signed   By: Minerva Fester M.D.   On: 02/12/2023 04:06   CT Soft Tissue Neck W Contrast  Result Date: 02/12/2023 CLINICAL DATA:  Nonpulsatile neck mass. EXAM: CT NECK WITH CONTRAST TECHNIQUE: Multidetector CT imaging of the neck was performed using the standard protocol following the bolus administration of intravenous contrast. RADIATION DOSE REDUCTION: This exam was performed according to the departmental dose-optimization program which includes automated exposure control, adjustment of the mA and/or kV according to patient size and/or use of iterative reconstruction technique. CONTRAST:  75mL OMNIPAQUE IOHEXOL 350 MG/ML SOLN COMPARISON:  None Available. FINDINGS: Pharynx and larynx: Limited assessment given presence of endotracheal tube. No visualized abnormality. Salivary glands: No inflammation, mass, or stone. Thyroid: Normal. Lymph nodes: There is a 1.6 cm lymph node at the posterior left neck (8:82) this is just posterior to the sternocleidomastoid. No other  enlarged or abnormal density lymph nodes. Vascular: Negative. Limited intracranial: Negative. Visualized orbits: Negative. Mastoids and visualized paranasal sinuses: Ethmoid sinus  mucosal thickening. Skeleton: No acute or aggressive process. Upper chest: Multifocal ground-glass opacity in the upper lobes, left worse than right. Other: None IMPRESSION: 1. A 1.6 cm lymph node at the posterior left neck, just posterior to the sternocleidomastoid. This is nonspecific, but may account for the palpable abnormality. 2. No abscess or drainable fluid collection. 3. Multifocal ground-glass opacity in the upper lobes, left worse than right, concerning for aspiration/infection. Electronically Signed   By: Deatra Robinson M.D.   On: 02/12/2023 03:30   DG Chest Portable 1 View  Result Date: 02/12/2023 CLINICAL DATA:  Altered EXAM: PORTABLE CHEST 1 VIEW COMPARISON:  12/24/2020 FINDINGS: Mild diffuse reticulo nodular opacity some of which is felt secondary to chronic lung disease. Suspect acute superimposed airspace disease in the infrahilar lungs. Normal cardiac size. No pneumothorax IMPRESSION: Mild diffuse reticulo nodular opacity some of which is felt secondary to chronic lung disease. Suspect acute superimposed airspace disease/possible pneumonia in the infrahilar lungs. Electronically Signed   By: Jasmine Pang M.D.   On: 02/12/2023 00:46   CT HEAD CODE STROKE WO CONTRAST  Result Date: 02/11/2023 CLINICAL DATA:  Code stroke. Acute neurologic deficit. No further clinical history available. EXAM: CT HEAD WITHOUT CONTRAST TECHNIQUE: Contiguous axial images were obtained from the base of the skull through the vertex without intravenous contrast. RADIATION DOSE REDUCTION: This exam was performed according to the departmental dose-optimization program which includes automated exposure control, adjustment of the mA and/or kV according to patient size and/or use of iterative reconstruction technique. COMPARISON:  None Available. FINDINGS: Motion degraded examination complicated by multiple streak artifacts from objects outside the field of view. Brain: There is no mass, hemorrhage or extra-axial  collection. The size and configuration of the ventricles and extra-axial CSF spaces are normal. The brain parenchyma is normal, without evidence of acute or chronic infarction. Vascular: No abnormal hyperdensity of the major intracranial arteries or dural venous sinuses. No intracranial atherosclerosis. Skull: The visualized skull base, calvarium and extracranial soft tissues are normal. Sinuses/Orbits: No fluid levels or advanced mucosal thickening of the visualized paranasal sinuses. No mastoid or middle ear effusion. The orbits are normal. ASPECTS [facility] Stroke Program Early CT Score) - Ganglionic level infarction (caudate, lentiform nuclei, internal capsule, insula, M1-M3 cortex): 7 - Supraganglionic infarction (M4-M6 cortex): 3 Total score (0-10 with 10 being normal): 10 IMPRESSION: 1. Motion degraded examination complicated by multiple streak artifacts from objects outside the field of view. 2. No acute intracranial abnormality. 3. ASPECTS is 10. These results were communicated to Dr. Caryl Pina at 7:57 pm on 02/11/2023 by text page via the [facility] messaging system. Electronically Signed   By: Deatra Robinson M.D.   On: 02/11/2023 19:57     Marcos Eke, NP [facility]  02/26/2023  11:05 AM  Of note, portions of this note may have been created with voice recognition software. While this note has been edited for accuracy, occasional wrong-word or 'sound-a-like' substitutions may have occurred due to the inherent limitations of voice recognition software.

## 2023-02-27 DIAGNOSIS — J690 Pneumonitis due to inhalation of food and vomit: Secondary | ICD-10-CM

## 2023-02-27 DIAGNOSIS — R4182 Altered mental status, unspecified: Secondary | ICD-10-CM | POA: Diagnosis not present

## 2023-02-27 DIAGNOSIS — J189 Pneumonia, unspecified organism: Secondary | ICD-10-CM | POA: Diagnosis not present

## 2023-02-27 LAB — GLUCOSE, CAPILLARY
Glucose-Capillary: 119 mg/dL — ABNORMAL HIGH (ref 70–99)
Glucose-Capillary: 102 mg/dL — ABNORMAL HIGH (ref 70–99)
Glucose-Capillary: 120 mg/dL — ABNORMAL HIGH (ref 70–99)
Glucose-Capillary: 90 mg/dL (ref 70–99)

## 2023-02-27 LAB — CBC WITH DIFFERENTIAL/PLATELET
Abs Immature Granulocytes: 0.17 10*3/uL — ABNORMAL HIGH (ref 0.00–0.07)
Basophils Absolute: 0 10*3/uL (ref 0.0–0.1)
Basophils Relative: 0 %
Eosinophils Absolute: 0.6 10*3/uL — ABNORMAL HIGH (ref 0.0–0.5)
Eosinophils Relative: 4 %
HCT: 24.7 % — ABNORMAL LOW (ref 39.0–52.0)
Hemoglobin: 8.7 g/dL — ABNORMAL LOW (ref 13.0–17.0)
Immature Granulocytes: 1 %
Lymphocytes Relative: 11 %
Lymphs Abs: 2 10*3/uL (ref 0.7–4.0)
MCH: 26 pg (ref 26.0–34.0)
MCHC: 35.2 g/dL (ref 30.0–36.0)
MCV: 74 fL — ABNORMAL LOW (ref 80.0–100.0)
Monocytes Absolute: 1.7 10*3/uL — ABNORMAL HIGH (ref 0.1–1.0)
Monocytes Relative: 9 %
Neutro Abs: 13.5 10*3/uL — ABNORMAL HIGH (ref 1.7–7.7)
Neutrophils Relative %: 75 %
Platelets: 671 10*3/uL — ABNORMAL HIGH (ref 150–400)
RBC: 3.34 MIL/uL — ABNORMAL LOW (ref 4.22–5.81)
RDW: 16.5 % — ABNORMAL HIGH (ref 11.5–15.5)
WBC: 18 10*3/uL — ABNORMAL HIGH (ref 4.0–10.5)
nRBC: 2.3 % — ABNORMAL HIGH (ref 0.0–0.2)

## 2023-02-27 LAB — COMPREHENSIVE METABOLIC PANEL
ALT: 61 U/L — ABNORMAL HIGH (ref 0–44)
AST: 36 U/L (ref 15–41)
Albumin: 2.1 g/dL — ABNORMAL LOW (ref 3.5–5.0)
Alkaline Phosphatase: 195 U/L — ABNORMAL HIGH (ref 38–126)
Anion gap: 12 (ref 5–15)
BUN: 16 mg/dL (ref 6–20)
CO2: 21 mmol/L — ABNORMAL LOW (ref 22–32)
Calcium: 8.5 mg/dL — ABNORMAL LOW (ref 8.9–10.3)
Chloride: 103 mmol/L (ref 98–111)
Creatinine, Ser: 1.09 mg/dL (ref 0.61–1.24)
GFR, Estimated: >60 mL/min (ref >60)
Glucose, Bld: 119 mg/dL — ABNORMAL HIGH (ref 70–99)
Potassium: 4.3 mmol/L (ref 3.5–5.1)
Sodium: 136 mmol/L (ref 135–145)
Total Bilirubin: 0.8 mg/dL (ref <1.2)
Total Protein: 7.8 g/dL (ref 6.5–8.1)

## 2023-02-27 LAB — MAGNESIUM: Magnesium: 2.3 mg/dL (ref 1.7–2.4)

## 2023-02-27 LAB — PHOSPHORUS: Phosphorus: 3.3 mg/dL (ref 2.5–4.6)

## 2023-02-27 MED ORDER — SODIUM CHLORIDE 0.9 % IV SOLN
3.0000 g | Freq: Four times a day (QID) | INTRAVENOUS | Status: DC
  Administered 2023-02-27 – 2023-03-03 (×14): 3 g via INTRAVENOUS
  Filled 2023-02-27 (×15): qty 8

## 2023-02-27 NOTE — Progress Notes (Signed)
PROGRESS NOTE    Peter MOSKWA  ZOX:[id number] DOB: 1965/07/29 DOA: 02/11/2023 PCP: Quentin Angst, MD  Chief Complaint  Patient presents with   Altered Mental Status    Brief Narrative:   Peter Kline is Peter Kline 57 y.o. male with Peter Kline history of sickle cell c disease with priapism, GERD, chronic pain, retinal hemorrhage, tobacco use, marijuana use, vitamin d deficiency. Patient presented secondary to altered mental status and required ICU admission for airway protection. Workup significant for acute strokes, acute DVT, aspiration pneumonia with hospitalization complicated by sepsis. Workup negative for CNS infection. Patient improved enough for extubation and transfer to the floor, with persistent fevers.   Assessment & Plan:   Principal Problem:   AMS (altered mental status) Active Problems:   Sepsis with encephalopathy without septic shock (HCC)   Encephalopathy acute   Acute respiratory failure with hypoxia (HCC)   Community acquired pneumonia   Protein-calorie malnutrition, severe   Cerebrovascular accident (CVA) due to thrombosis of precerebral artery (HCC)   Paradoxical embolism (HCC)   HAP (hospital-acquired pneumonia)   Aspiration pneumonia of both lungs (HCC)  Acute metabolic encephalopathy Acute toxic encephalopathy Multifactorial in setting of polysubstance abuse and strokes, in addition to likely aspiration pneumonia. Infectious workup negative for CNS infection. Patient continues to get closer to baseline.   Acute embolic CVA MRI on 10/31 confirmed Peter Kline subcentimeter acute infarct in the left occipital white matter in addition to MRI on 11/3 significant for scattered small new acute infarcts in the bilateral cerebral and cerebellar hemispheres. CTA head and neck negative for large vessel occlusion or other emergent finding. LDL of 49. Hemoglobin A1C of 4.8%. Transthoracic Echocardiogram significant for normal LVEF of 55-60% with grade 2 diastolic dysfunction and no  atrial level shunt. Transesophageal Echocardiogram significant for positive bubble study suggesting PFO. Neurology recommendations for Heparin drip with transition to Eliquis when able. PT/OT recommendations for likely SNF pending cognitive improvement. Heparin discontinued -neurology c/s, appreciate recs (signed off 11/8) - bilateral cerebral and cerebellar hemisphere strokes from paradoxical embolism due to R to L cardiac shunt and presence of DVT with concurrent small vessel disease.  Neurology recommending eliquis. Statin not indicated as LDL within goal.    Aspiration pneumonia Recurrent Fevers Sepsis Staph Epidermidis Pneumonia Acute respiratory failure with hypoxia Related to acute encephalopathy and aspiration pneumonia  CT chest with patchy airspace consolidation in bilateral lower lobes, L>R and minimally in RUL concerning for multifocal pneumonia - secretions in distal trachea and L mainstem bronchus Appreciate ID assistance -> transitioned to unasyn.  Recommending blood cultures if recurrent fevers.     Acute bilateral lower extremity DVT Acute DVT involving the left posterior tibial veins and left peroneal veins. Age indeterminate DVT involving the right posterior tibial veins and right peroneal veins. Patient managed on heparin and transitioned to Eliquis. -Continue Eliquis PO   Dysuria Urine culture with no growth. Symptoms are improved.   Hypernatremia improved   Sickle cell C disease Noted. Patient is on folic acid as an outpatient. -Continue folic acid   GERD Noted.   Dysphagia Improved, SLP recommending regular/thin liquids on 11/12 NG tube discontinued 11/13, tolerating diet well     DVT prophylaxis: eliquis Code Status: full Family Communication: none Disposition:   Status is: Inpatient Remains inpatient appropriate because: need for continued inpatient care   Consultants:  ID  Procedures:  LE [place] Summary:  BILATERAL:  -No evidence of popliteal  cyst, bilaterally.  RIGHT:   - Findings consistent with  age indeterminate deep vein thrombosis  involving the right posterior tibial veins, and right peroneal veins.        LEFT:  - Findings consistent with acute deep vein thrombosis involving the left  posterior tibial veins, and left peroneal veins.   TEE IMPRESSIONS     1. Left ventricular ejection fraction, by estimation, is 55 to 60%. The  left ventricle has normal function. The left ventricle has no regional  wall motion abnormalities.   2. Right ventricular systolic function is normal. The right ventricular  size is normal. Tricuspid regurgitation signal is inadequate for assessing  PA pressure.   3. Left atrial size was mildly dilated. No left atrial/left atrial  appendage thrombus was detected.   4. The mitral valve is normal in structure. Trivial mitral valve  regurgitation. No evidence of mitral stenosis.   5. The aortic valve is tricuspid. Aortic valve regurgitation is not  visualized. No aortic stenosis is present.   6. Bubble study positive suggesting PFO.   Echo IMPRESSIONS     1. Left ventricular ejection fraction, by estimation, is 55 to 60%. The  left ventricle has normal function. The left ventricle has no regional  wall motion abnormalities. Left ventricular diastolic parameters are  consistent with Grade II diastolic  dysfunction (pseudonormalization).   2. RV-RA gradient 33 mmHg suggesting at least mild to moderate increase  in RVSP depending on CVP. Right ventricular systolic function is normal.  The right ventricular size is normal.   3. Left atrial size was moderately dilated.   4. The mitral valve is abnormal, mildly calcified with somewhat  restricted posterior leaflet. Mild to moderate mitral valve regurgitation.   5. The aortic valve is tricuspid. Aortic valve regurgitation is not  visualized.   6. The inferior vena cava is normal in size with <50% respiratory  variability, suggesting  right atrial pressure of 8 mmHg.   Comparison(s): No prior Echocardiogram.    Antimicrobials:  Anti-infectives (From admission, onward)    Start     Dose/Rate Route Frequency Ordered Stop   02/27/23 1330  Ampicillin-Sulbactam (UNASYN) 3 g in sodium chloride 0.9 % 100 mL IVPB        3 g 200 mL/hr over 30 Minutes Intravenous Every 6 hours 02/27/23 1237     02/26/23 2200  linezolid (ZYVOX) tablet 600 mg  Status:  Discontinued        600 mg Oral Every 12 hours 02/26/23 1016 02/27/23 1237   02/23/23 2100  vancomycin (VANCOREADY) IVPB 1250 mg/250 mL  Status:  Discontinued        1,250 mg 166.7 mL/hr over 90 Minutes Intravenous Every 12 hours 02/23/23 1144 02/26/23 1016   02/20/23 2100  vancomycin (VANCOREADY) IVPB 1500 mg/300 mL  Status:  Discontinued        1,500 mg 150 mL/hr over 120 Minutes Intravenous Every 12 hours 02/20/23 0929 02/23/23 1144   02/20/23 0830  vancomycin (VANCOREADY) IVPB 1500 mg/300 mL        1,500 mg 150 mL/hr over 120 Minutes Intravenous  Once 02/20/23 0744 02/20/23 1128   02/18/23 1945  piperacillin-tazobactam (ZOSYN) IVPB 3.375 g  Status:  Discontinued        3.375 g 12.5 mL/hr over 240 Minutes Intravenous Every 8 hours 02/18/23 1859 02/20/23 1452   02/17/23 0800  cefTRIAXone (ROCEPHIN) 2 g in sodium chloride 0.9 % 100 mL IVPB        2 g 200 mL/hr over 30 Minutes Intravenous Every 24 hours  02/17/23 0714 02/19/23 1936   02/14/23 1200  Ampicillin-Sulbactam (UNASYN) 3 g in sodium chloride 0.9 % 100 mL IVPB  Status:  Discontinued        3 g 200 mL/hr over 30 Minutes Intravenous Every 6 hours 02/14/23 1053 02/17/23 0740   02/14/23 1000  cefTRIAXone (ROCEPHIN) 2 g in sodium chloride 0.9 % 100 mL IVPB  Status:  Discontinued        2 g 200 mL/hr over 30 Minutes Intravenous Every 24 hours 02/13/23 1318 02/14/23 1052   02/13/23 1000  doxycycline (VIBRA-TABS) tablet 100 mg  Status:  Discontinued        100 mg Per Tube Every 12 hours 02/13/23 0853 02/14/23 1052   02/12/23  1000  cefTRIAXone (ROCEPHIN) 2 g in sodium chloride 0.9 % 100 mL IVPB  Status:  Discontinued        2 g 200 mL/hr over 30 Minutes Intravenous Every 12 hours 02/12/23 0638 02/13/23 1318   02/12/23 0800  ampicillin (OMNIPEN) 2 g in sodium chloride 0.9 % 100 mL IVPB  Status:  Discontinued        2 g 300 mL/hr over 20 Minutes Intravenous Every 4 hours 02/12/23 0638 02/13/23 0853   02/12/23 0800  vancomycin (VANCOCIN) IVPB 1000 mg/200 mL premix  Status:  Discontinued        1,000 mg 200 mL/hr over 60 Minutes Intravenous Every 8 hours 02/12/23 0703 02/13/23 0853   02/11/23 2300  acyclovir (ZOVIRAX) 825 mg in dextrose 5 % 250 mL IVPB  Status:  Discontinued        10 mg/kg  82.6 kg 266.5 mL/hr over 60 Minutes Intravenous Every 8 hours 02/11/23 2251 02/13/23 0853   02/11/23 2145  vancomycin (VANCOREADY) IVPB 1750 mg/350 mL        1,750 mg 175 mL/hr over 120 Minutes Intravenous  Once 02/11/23 2137 02/12/23 0231   02/11/23 2145  ampicillin (OMNIPEN) 2 g in sodium chloride 0.9 % 100 mL IVPB        2 g 300 mL/hr over 20 Minutes Intravenous  Once 02/11/23 2138 02/11/23 2316   02/11/23 2115  cefTRIAXone (ROCEPHIN) 2 g in sodium chloride 0.9 % 100 mL IVPB        2 g 200 mL/hr over 30 Minutes Intravenous  Once 02/11/23 2101 02/11/23 2223       Subjective: No new complaints  Objective: Vitals:   02/27/23 0335 02/27/23 0500 02/27/23 0807 02/27/23 1136  BP: 134/88  124/77 (!) 126/90  Pulse: 79  86 86  Resp: 17  17 18   Temp: 99 F (37.2 C)  98.5 F (36.9 C) 98.9 F (37.2 C)  TempSrc: Oral  Oral Oral  SpO2: 96%  92% 96%  Weight:  79.1 kg    Height:        Intake/Output Summary (Last 24 hours) at 02/27/2023 1520 Last data filed at 02/27/2023 0600 Gross per 24 hour  Intake 240 ml  Output 1000 ml  Net -760 ml   Filed Weights   02/25/23 0722 02/26/23 0716 02/27/23 0500  Weight: 78 kg 77.9 kg 79.1 kg    Examination:  General: No acute distress. Cardiovascular:RRR Lungs:  unlabored Neurological: Alert and oriented 3. Moves all extremities 4 with equal strength. Cranial nerves II through XII grossly intact. Extremities: No clubbing or cyanosis. No edema.   Data Reviewed: I have personally reviewed following labs and imaging studies  CBC: Recent Labs  Lab 02/21/23 0523 02/22/23 0428 02/23/23 0930 02/24/23  1610 02/25/23 0439 02/26/23 0426 02/27/23 0859  WBC 21.2*  22.1*   < > 30.4* 25.0* 22.8* 19.8* 18.0*  NEUTROABS 16.3*  --   --   --   --   --  13.5*  HGB 9.7*  9.6*   < > 9.5* 8.5* 8.7* 9.4* 8.7*  HCT 27.9*  27.3*   < > 26.9* 24.4* 24.2* 26.7* 24.7*  MCV 77.1*  76.7*   < > 75.4* 74.8* 74.2* 74.4* 74.0*  PLT 287  281   < > 355 383 467* 459* 671*   < > = values in this interval not displayed.    Basic Metabolic Panel: Recent Labs  Lab 02/21/23 0523 02/22/23 0428 02/23/23 0930 02/25/23 0439 02/27/23 0859  NA 148* 149* 144 138 136  K 3.5 3.8 3.8 3.9 4.3  CL 115* 114* 111 106 103  CO2 25 25 22 22  21*  GLUCOSE 152* 130* 122* 129* 119*  BUN 19 20 16 17 16   CREATININE 1.06 1.10 0.88 0.77 1.09  CALCIUM 8.3* 8.7* 8.5* 8.2* 8.5*  MG 2.6* 2.8*  --   --  2.3  PHOS  --   --   --   --  3.3    GFR: Estimated Creatinine Clearance: 81.1 mL/min (by C-G formula based on SCr of 1.09 mg/dL).  Liver Function Tests: Recent Labs  Lab 02/27/23 0859  AST 36  ALT 61*  ALKPHOS 195*  BILITOT 0.8  PROT 7.8  ALBUMIN 2.1*    CBG: Recent Labs  Lab 02/26/23 2020 02/26/23 2336 02/27/23 0334 02/27/23 0811 02/27/23 1137  GLUCAP 106* 104* 119* 102* 120*     Recent Results (from the past 240 hour(s))  Surgical PCR screen     Status: Abnormal   Collection Time: 02/17/23  7:47 PM   Specimen: Nasal Mucosa; Nasal Swab  Result Value Ref Range Status   MRSA, PCR ([place]) NEGATIVE Final    INVALID, UNABLE TO DETERMINE THE PRESENCE OF TARGET DUE TO SPECIMEN INTEGRITY. RECOLLECTION REQUESTED.    Comment: RESULT CALLED TO, READ BACK BY AND VERIFIED  WITH: RN C. RUECKEL [id number] @2201  FH    Staphylococcus aureus ([place]) NEGATIVE Final    INVALID, UNABLE TO DETERMINE THE PRESENCE OF TARGET DUE TO SPECIMEN INTEGRITY. RECOLLECTION REQUESTED.    Comment: Performed at [facility], [address] [place], [place] [address]  Surgical PCR screen     Status: None   Collection Time: 02/17/23 10:10 PM   Specimen: Nasal Mucosa; Nasal Swab  Result Value Ref Range Status   MRSA, PCR NEGATIVE NEGATIVE Final   Staphylococcus aureus NEGATIVE NEGATIVE Final    Comment: (NOTE) The Xpert SA Assay (FDA approved for NASAL specimens in patients 77 years of age and older), is one component of Jlyn Cerros comprehensive surveillance program. It is not intended to diagnose infection nor to guide or monitor treatment. Performed at [facility], [address] [place], [place] [address]   Culture, Respiratory w Gram Stain     Status: None   Collection Time: 02/18/23  7:09 AM   Specimen: Tracheal Aspirate; Respiratory  Result Value Ref Range Status   Specimen Description TRACHEAL ASPIRATE  Final   Special Requests NONE  Final   Gram Stain   Final    RARE WBC PRESENT,BOTH PMN AND MONONUCLEAR NO ORGANISMS SEEN Performed at [facility], [address], [place], [place] [address]    Culture MODERATE STAPHYLOCOCCUS EPIDERMIDIS  Final   Report  Status 02/20/2023 FINAL  Final   Organism ID, Bacteria STAPHYLOCOCCUS EPIDERMIDIS  Final      Susceptibility   Staphylococcus epidermidis - MIC*    CIPROFLOXACIN >=8 RESISTANT Resistant     ERYTHROMYCIN >=8 RESISTANT Resistant     GENTAMICIN <=0.5 SENSITIVE Sensitive     OXACILLIN >=4 RESISTANT Resistant     TETRACYCLINE <=1 SENSITIVE Sensitive     VANCOMYCIN 2 SENSITIVE Sensitive     TRIMETH/SULFA <=10 SENSITIVE Sensitive     CLINDAMYCIN <=0.25 SENSITIVE Sensitive     RIFAMPIN <=0.5 SENSITIVE Sensitive     Inducible Clindamycin NEGATIVE Sensitive     * MODERATE STAPHYLOCOCCUS EPIDERMIDIS  Urine Culture      Status: None   Collection Time: 02/21/23  9:47 AM   Specimen: Urine, Clean Catch  Result Value Ref Range Status   Specimen Description URINE, CLEAN CATCH  Final   Special Requests NONE  Final   Culture   Final    NO GROWTH Performed at [facility], [address] [place], [place] [address]    Report Status 02/22/2023 FINAL  Final  Culture, blood (Routine X 2) w Reflex to ID Panel     Status: None   Collection Time: 02/21/23  1:21 PM   Specimen: BLOOD RIGHT ARM  Result Value Ref Range Status   Specimen Description BLOOD RIGHT ARM  Final   Special Requests   Final    BOTTLES DRAWN AEROBIC AND ANAEROBIC Blood Culture results may not be optimal due to an inadequate volume of blood received in culture bottles   Culture   Final    NO GROWTH 5 DAYS Performed at [facility], [address] [place], [place] [address]    Report Status 02/26/2023 FINAL  Final  Culture, blood (Routine X 2) w Reflex to ID Panel     Status: None   Collection Time: 02/21/23  1:24 PM   Specimen: BLOOD RIGHT WRIST  Result Value Ref Range Status   Specimen Description BLOOD RIGHT WRIST  Final   Special Requests   Final    BOTTLES DRAWN AEROBIC AND ANAEROBIC Blood Culture results may not be optimal due to an inadequate volume of blood received in culture bottles   Culture   Final    NO GROWTH 5 DAYS Performed at [facility], [address] [place], [place] [address]    Report Status 02/26/2023 FINAL  Final         Radiology Studies: No results found.      Scheduled Meds:  acetaminophen  1,000 mg Oral Q6H   apixaban  5 mg Oral BID   busPIRone  30 mg Oral Q8H   Or   busPIRone  30 mg Per Tube Q8H   feeding supplement  237 mL Oral BID BM   folic acid  1 mg Oral Daily   insulin aspart  0-9 Units Subcutaneous Q4H   multivitamin with minerals  1 tablet Oral Daily   nicotine  14 mg Transdermal Daily   mouth rinse  15 mL Mouth Rinse 4 times per day   sodium chloride flush  10  mL Intravenous Q12H   thiamine  100 mg Oral Daily   Continuous Infusions:  ampicillin-sulbactam (UNASYN) IV 3 g (02/27/23 1404)     LOS: 15 days    Time spent: over 30 min     Lacretia Nicks, MD [facility]   To contact the attending provider between 7A-7P or the  covering provider during after hours 7P-7A, please log into the web site [email or web address] and access using universal [place] password for that web site. If you do not have the password, please call the hospital operator.  02/27/2023, 3:20 PM

## 2023-02-27 NOTE — Plan of Care (Signed)
  Problem: Education: Goal: Ability to describe self-care measures that may prevent or decrease complications (Diabetes Survival Skills Education) will improve 02/27/2023 1114 by Beryle Flock, RN Outcome: Progressing 02/27/2023 1113 by Beryle Flock, RN Outcome: Progressing Goal: Individualized Educational Video(s) 02/27/2023 1114 by Beryle Flock, RN Outcome: Progressing 02/27/2023 1113 by Beryle Flock, RN Outcome: Progressing

## 2023-02-27 NOTE — Progress Notes (Signed)
Occupational Therapy Treatment Patient Details Name: Peter Kline MRN: [id number] DOB: September 15, 1965 Today's Date: 02/27/2023   History of present illness Pt is a 57 y/o male presenting to the ED on 10/30 with AMS, found wandering the streets of [facility] confused with slurred speech.  Initially negative for stroke.  Mental status failed to improve, pt became more agitated and eventually was intubated for airway protection.  Chest xray showed multifocal PNA.  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   OT comments  This 57 yo male making great progress since I last saw him on 11/8--he currently is setup/S- CGA for all basic ADLs at RW level. He really needs to be at an Mod I level to go home with his wife who is a single LE amputee. They live in a wheelchair accessible apartment. He would benefit from intensive inpatient follow up therapy, >3 hours/day as of right now, but may progress more quickly and only need HHOT--will continue to follow patient.       If plan is discharge home, recommend the following:  A little help with walking and/or transfers;A little help with bathing/dressing/bathroom;Assist for transportation;Assistance with cooking/housework;Help with stairs or ramp for entrance;Direct supervision/assist for financial management;Direct supervision/assist for medications management   Equipment Recommendations  None recommended by OT       Precautions / Restrictions Precautions Precautions: Fall Restrictions Weight Bearing Restrictions: No       Mobility Bed Mobility Overal bed mobility: Modified Independent Bed Mobility: Supine to Sit, Sit to Supine     Supine to sit: Contact guard Sit to supine:  Contact guard assist        Transfers Overall transfer level: Needs assistance Equipment used: Rolling walker (2 wheels) Transfers: Sit to/from Stand Sit to Stand: Contact guard assist           General transfer comment: no posterior lean today     Balance Overall balance assessment: Needs assistance Sitting-balance support: No upper extremity supported, Feet supported Sitting balance-Leahy Scale: Good Sitting balance - Comments: can bend forward to doff and donn one sock and then cross one leg over the other to doff and donn the other sock without LOB either way   Standing balance support: Bilateral upper extremity supported, Reliant on assistive device for balance Standing balance-Leahy Scale: Poor Standing balance comment: reliant on UE support                           ADL either performed or assessed with clinical judgement   ADL Overall ADL's : Needs assistance/impaired                     Lower Body Dressing: Contact guard assist;Sit to/from stand   Toilet Transfer: Contact guard assist;Rolling walker (2 wheels) Toilet Transfer Details (indicate cue type and reason): simulated sit<>stand from bed then forward, back and lateral side steps at EOB                Extremity/Trunk Assessment Upper Extremity Assessment Upper Extremity Assessment: Overall WFL for tasks assessed            Vision Patient Visual Report: No change from baseline  Cognition Arousal: Alert Behavior During Therapy: WFL for tasks assessed/performed Overall Cognitive Status: Impaired/Different from baseline Area of Impairment: Attention, Following commands, Safety/judgement, Awareness                   Current Attention Level: Sustained   Following Commands: Follows one step commands consistently, Follows multi-step commands inconsistently   Awareness: Emergent                       Pertinent Vitals/ Pain       Pain  Assessment Pain Assessment: No/denies pain         Frequency  Min 1X/week        Progress Toward Goals   Progress towards OT goals: Progressing toward goals            AM-PAC OT "6 Clicks" Daily Activity     Outcome Measure   Help from another person eating meals?: None Help from another person taking care of personal grooming?: A Little Help from another person toileting, which includes using toliet, bedpan, or urinal?: A Little Help from another person bathing (including washing, rinsing, drying)?: A Little Help from another person to put on and taking off regular upper body clothing?: A Little Help from another person to put on and taking off regular lower body clothing?: A Little 6 Click Score: 19    End of Session Equipment Utilized During Treatment: Gait belt;Rolling walker (2 wheels)  OT Visit Diagnosis: Unsteadiness on feet (R26.81);Other abnormalities of gait and mobility (R26.89);Other symptoms and signs involving cognitive function   Activity Tolerance Patient tolerated treatment well   Patient Left in bed;with call bell/phone within reach;with bed alarm set           Time: 1425-1454 OT Time Calculation (min): 29 min  Charges: OT General Charges $OT Visit: 1 Visit OT Treatments $Self Care/Home Management : 23-37 mins  Lindon Romp OT Acute Rehabilitation Services Office [phone]    Evette Georges 02/27/2023, 5:05 PM

## 2023-02-27 NOTE — Plan of Care (Signed)
  Problem: Education: Goal: Ability to describe self-care measures that may prevent or decrease complications (Diabetes Survival Skills Education) will improve Outcome: Progressing Goal: Individualized Educational Video(s) Outcome: Progressing   

## 2023-02-27 NOTE — Progress Notes (Addendum)
[facility] Infectious Diseases Follow Up Note  Patient Identification: Patient Name: Peter Kline MRN: [id number] Admit Date: 02/11/2023  7:38 PM Age: 57 y.o.Today's Date: 02/27/2023  Reason for Visit: fevers, pna   Principal Problem:   AMS (altered mental status) Active Problems:   Sepsis with encephalopathy without septic shock (HCC)   Encephalopathy acute   Acute respiratory failure with hypoxia (HCC)   Community acquired pneumonia   Protein-calorie malnutrition, severe   Cerebrovascular accident (CVA) due to thrombosis of precerebral artery (HCC)   Paradoxical embolism (HCC)   HAP (hospital-acquired pneumonia)   Antibiotics: Vancomycin 11/7- 11/13 Linezolid 11/13-c  Lines/Hardwares:   Interval Events: Tmax 101.4, WBC down to 18   Assessment 57 year old male with history of sickle cell disease with priapism, GERD, chronic pain, retinal hemorrhage who initially presented secondary to altered mental status with initial hospital admission requiring ICU admission for airway protection.  Hospital course was remarkable for acute CVA, acute DVT, aspiration pneumonia/sepsis.  Workup for CNS infection with LP  11/4 was negative.  Patient was later extubated and transferred to the floor. ID engaged for  # Fevers/leukocytosis  - Initial concerns for nosocomial PNA ( MRSE), now possibly related to atelectasis/aspiration component - clinically he is improving except fever yesterday, leukocytosis is downtrending @18 . He is in room air  -  no other sources of infection, no phlebitis, limb or joint swelling, diarrhea, rashes, wounds  Recommendations - Will DC linezolid as he has completed 7-8 days of tx for MRSE PNA. Add Unasyn to cover for aspiration pna.  - Check blood cx if has another fevers - Fu fevers, CBC and any new symptoms or signs of infection  - Recommend using incentive spirometry   Rest of the management as per the  primary team. Thank you for the consult. Please page with pertinent questions or concerns.  ______________________________________________________________________ Subjective patient seen and examined at the bedside. Family member at bedside. Reported one episode of fever yesterday but he feels better with improved sob, cough. He had some phlegm that he tried to cough up but later swallowed   Vitals BP (!) 126/90 (BP Location: Left Arm)   Pulse 86   Temp 98.9 F (37.2 C) (Oral)   Resp 18   Ht 6' 0.01" (1.829 m)   Wt 79.1 kg   SpO2 96%   BMI 23.65 kg/m     Physical Exam Constitutional:  adult male lying in the bed, non toxic appearing     Comments: HEENT wnl   Cardiovascular:     Rate and Rhythm: Normal rate and regular rhythm.     Heart sounds: s1s2  Pulmonary:     Effort: Pulmonary effort is normal.     Comments: Normal breath sounds   Abdominal:     Palpations: Abdomen is soft.     Tenderness: non distended and non tender   Musculoskeletal:        General: No swelling or tenderness in peripheral joints  Skin:    Comments: no rashes, phlebitis   Neurological:     General: awake, alert and oriented, non focal and follows commands   Psychiatric:        Mood and Affect: Mood normal.   Pertinent Microbiology Results for orders placed or performed during the hospital encounter of 02/11/23  Culture, blood (Routine X 2)     Status: None   Collection Time: 02/11/23  9:45 PM   Specimen: BLOOD  Result Value Ref Range Status   Specimen  Description BLOOD LEFT ANTECUBITAL  Final   Special Requests   Final    BOTTLES DRAWN AEROBIC AND ANAEROBIC Blood Culture adequate volume   Culture   Final    NO GROWTH 5 DAYS Performed at [facility], [address] [place], [place] [address]    Report Status 02/16/2023 FINAL  Final  Culture, blood (Routine X 2)     Status: None   Collection Time: 02/11/23  9:50 PM   Specimen: BLOOD RIGHT FOREARM  Result Value Ref Range  Status   Specimen Description BLOOD RIGHT FOREARM  Final   Special Requests   Final    BOTTLES DRAWN AEROBIC AND ANAEROBIC Blood Culture adequate volume   Culture   Final    NO GROWTH 5 DAYS Performed at [facility], [address], [place], [place] [address]    Report Status 02/16/2023 FINAL  Final  Resp panel by RT-PCR (RSV, Flu A&B, Covid) Anterior Nasal Swab     Status: None   Collection Time: 02/11/23 11:23 PM   Specimen: Anterior Nasal Swab  Result Value Ref Range Status   SARS Coronavirus 2 by RT PCR NEGATIVE NEGATIVE Final   Influenza A by PCR NEGATIVE NEGATIVE Final   Influenza B by PCR NEGATIVE NEGATIVE Final    Comment: (NOTE) The Xpert Xpress SARS-CoV-2/FLU/RSV plus assay is intended as an aid in the diagnosis of influenza from Nasopharyngeal swab specimens and should not be used as a sole basis for treatment. Nasal washings and aspirates are unacceptable for Xpert Xpress SARS-CoV-2/FLU/RSV testing.  Fact Sheet for Patients: BloggerCourse.com  Fact Sheet for Healthcare Providers: SeriousBroker.it  This test is not yet approved or cleared by the [place] FDA and has been authorized for detection and/or diagnosis of SARS-CoV-2 by FDA under an Emergency Use Authorization (EUA). This EUA will remain in effect (meaning this test can be used) for the duration of the COVID-19 declaration under Section 564(b)(1) of the Act, 21 U.S.C. section 360bbb-3(b)(1), unless the authorization is terminated or revoked.     Resp Syncytial Virus by PCR NEGATIVE NEGATIVE Final    Comment: (NOTE) Fact Sheet for Patients: BloggerCourse.com  Fact Sheet for Healthcare Providers: SeriousBroker.it  This test is not yet approved or cleared by the [place] FDA and has been authorized for detection and/or diagnosis of SARS-CoV-2 by FDA under an Emergency Use Authorization  (EUA). This EUA will remain in effect (meaning this test can be used) for the duration of the COVID-19 declaration under Section 564(b)(1) of the Act, 21 U.S.C. section 360bbb-3(b)(1), unless the authorization is terminated or revoked.  Performed at [facility], [address], [place], [place] [address]   MRSA Next Gen by PCR, Nasal     Status: None   Collection Time: 02/12/23  8:35 AM  Result Value Ref Range Status   MRSA by PCR Next Gen NOT DETECTED NOT DETECTED Final    Comment: (NOTE) The GeneXpert MRSA Assay (FDA approved for NASAL specimens only), is one component of a comprehensive MRSA colonization surveillance program. It is not intended to diagnose MRSA infection nor to guide or monitor treatment for MRSA infections. Test performance is not FDA approved in patients less than 44 years old. Performed at [facility], [address], [place], [place] [address]   Culture, Respiratory w Gram Stain     Status: None   Collection Time: 02/13/23  8:11 AM   Specimen: Tracheal Aspirate; Respiratory  Result Value  Ref Range Status   Specimen Description TRACHEAL ASPIRATE  Final   Special Requests NONE  Final   Gram Stain   Final    MODERATE WBC PRESENT, PREDOMINANTLY PMN NO ORGANISMS SEEN    Culture   Final    NO GROWTH 3 DAYS Performed at [facility], [address], [place], [place] [address]    Report Status 02/16/2023 FINAL  Final  Respiratory (~20 pathogens) panel by PCR     Status: None   Collection Time: 02/13/23  8:31 AM   Specimen: Nasopharyngeal Swab; Respiratory  Result Value Ref Range Status   Adenovirus NOT DETECTED NOT DETECTED Final   Coronavirus 229E NOT DETECTED NOT DETECTED Final    Comment: (NOTE) The Coronavirus on the Respiratory Panel, DOES NOT test for the novel  Coronavirus (2019 nCoV)    Coronavirus HKU1 NOT DETECTED NOT DETECTED Final   Coronavirus NL63 NOT DETECTED NOT DETECTED Final   Coronavirus OC43 NOT DETECTED NOT  DETECTED Final   Metapneumovirus NOT DETECTED NOT DETECTED Final   Rhinovirus / Enterovirus NOT DETECTED NOT DETECTED Final   Influenza A NOT DETECTED NOT DETECTED Final   Influenza B NOT DETECTED NOT DETECTED Final   Parainfluenza Virus 1 NOT DETECTED NOT DETECTED Final   Parainfluenza Virus 2 NOT DETECTED NOT DETECTED Final   Parainfluenza Virus 3 NOT DETECTED NOT DETECTED Final   Parainfluenza Virus 4 NOT DETECTED NOT DETECTED Final   Respiratory Syncytial Virus NOT DETECTED NOT DETECTED Final   Bordetella pertussis NOT DETECTED NOT DETECTED Final   Bordetella Parapertussis NOT DETECTED NOT DETECTED Final   Chlamydophila pneumoniae NOT DETECTED NOT DETECTED Final   Mycoplasma pneumoniae NOT DETECTED NOT DETECTED Final    Comment: Performed at [facility], [address] [place], [place] [address]  Culture, blood (Routine X 2) w Reflex to ID Panel     Status: None   Collection Time: 02/16/23  8:03 PM   Specimen: BLOOD LEFT HAND  Result Value Ref Range Status   Specimen Description BLOOD LEFT HAND  Final   Special Requests   Final    BOTTLES DRAWN AEROBIC AND ANAEROBIC Blood Culture results may not be optimal due to an excessive volume of blood received in culture bottles   Culture   Final    NO GROWTH 5 DAYS Performed at [facility], [address] [place], [place] [address]    Report Status 02/21/2023 FINAL  Final  Culture, blood (Routine X 2) w Reflex to ID Panel     Status: None   Collection Time: 02/16/23  8:09 PM   Specimen: BLOOD LEFT ARM  Result Value Ref Range Status   Specimen Description BLOOD LEFT ARM  Final   Special Requests   Final    BOTTLES DRAWN AEROBIC AND ANAEROBIC Blood Culture results may not be optimal due to an excessive volume of blood received in culture bottles   Culture   Final    NO GROWTH 5 DAYS Performed at [facility], [address] [place], [place] [address]    Report Status 02/21/2023 FINAL  Final  CSF culture w Gram  Stain     Status: None   Collection Time: 02/17/23  2:35 PM   Specimen: PATH Cytology CSF; Cerebrospinal Fluid  Result Value Ref Range Status   Specimen Description CSF  Final   Special Requests NONE  Final   Gram Stain NO WBC SEEN NO ORGANISMS SEEN CYTOSPIN SMEAR  Final   Culture   Final    NO GROWTH 3 DAYS Performed at [facility], [address], [place], [place] [address]    Report Status 02/21/2023 FINAL  Final  Surgical PCR screen     Status: Abnormal   Collection Time: 02/17/23  7:47 PM   Specimen: Nasal Mucosa; Nasal Swab  Result Value Ref Range Status   MRSA, PCR (A) NEGATIVE Final    INVALID, UNABLE TO DETERMINE THE PRESENCE OF TARGET DUE TO SPECIMEN INTEGRITY. RECOLLECTION REQUESTED.    Comment: RESULT CALLED TO, READ BACK BY AND VERIFIED WITH: RN C. RUECKEL [id number] @2201  FH    Staphylococcus aureus (A) NEGATIVE Final    INVALID, UNABLE TO DETERMINE THE PRESENCE OF TARGET DUE TO SPECIMEN INTEGRITY. RECOLLECTION REQUESTED.    Comment: Performed at [facility], [address] [place], [place] [address]  Surgical PCR screen     Status: None   Collection Time: 02/17/23 10:10 PM   Specimen: Nasal Mucosa; Nasal Swab  Result Value Ref Range Status   MRSA, PCR NEGATIVE NEGATIVE Final   Staphylococcus aureus NEGATIVE NEGATIVE Final    Comment: (NOTE) The Xpert SA Assay (FDA approved for NASAL specimens in patients 53 years of age and older), is one component of a comprehensive surveillance program. It is not intended to diagnose infection nor to guide or monitor treatment. Performed at [facility], [address], [place], [place] [address]   Culture, Respiratory w Gram Stain     Status: None   Collection Time: 02/18/23  7:09 AM   Specimen: Tracheal Aspirate; Respiratory  Result Value Ref Range Status   Specimen Description TRACHEAL ASPIRATE  Final   Special Requests NONE  Final   Gram Stain   Final    RARE WBC PRESENT,BOTH PMN AND  MONONUCLEAR NO ORGANISMS SEEN Performed at [facility], [address] [place], [place] [address]    Culture MODERATE STAPHYLOCOCCUS EPIDERMIDIS  Final   Report Status 02/20/2023 FINAL  Final   Organism ID, Bacteria STAPHYLOCOCCUS EPIDERMIDIS  Final      Susceptibility   Staphylococcus epidermidis - MIC*    CIPROFLOXACIN >=8 RESISTANT Resistant     ERYTHROMYCIN >=8 RESISTANT Resistant     GENTAMICIN <=0.5 SENSITIVE Sensitive     OXACILLIN >=4 RESISTANT Resistant     TETRACYCLINE <=1 SENSITIVE Sensitive     VANCOMYCIN 2 SENSITIVE Sensitive     TRIMETH/SULFA <=10 SENSITIVE Sensitive     CLINDAMYCIN <=0.25 SENSITIVE Sensitive     RIFAMPIN <=0.5 SENSITIVE Sensitive     Inducible Clindamycin NEGATIVE Sensitive     * MODERATE STAPHYLOCOCCUS EPIDERMIDIS  Urine Culture     Status: None   Collection Time: 02/21/23  9:47 AM   Specimen: Urine, Clean Catch  Result Value Ref Range Status   Specimen Description URINE, CLEAN CATCH  Final   Special Requests NONE  Final   Culture   Final    NO GROWTH Performed at [facility], [address], [place], [place] [address]    Report Status 02/22/2023 FINAL  Final  Culture, blood (Routine X 2) w Reflex to ID Panel     Status: None   Collection Time: 02/21/23  1:21 PM   Specimen: BLOOD RIGHT ARM  Result Value Ref Range Status   Specimen Description BLOOD RIGHT ARM  Final   Special Requests   Final    BOTTLES DRAWN AEROBIC AND ANAEROBIC Blood Culture results  may not be optimal due to an inadequate volume of blood received in culture bottles   Culture   Final    NO GROWTH 5 DAYS Performed at [facility], [address], [place], [place] [address]    Report Status 02/26/2023 FINAL  Final  Culture, blood (Routine X 2) w Reflex to ID Panel     Status: None   Collection Time: 02/21/23  1:24 PM   Specimen: BLOOD RIGHT WRIST  Result Value Ref Range Status   Specimen Description BLOOD RIGHT WRIST  Final   Special Requests   Final     BOTTLES DRAWN AEROBIC AND ANAEROBIC Blood Culture results may not be optimal due to an inadequate volume of blood received in culture bottles   Culture   Final    NO GROWTH 5 DAYS Performed at [facility], [address], [place], [place] [address]    Report Status 02/26/2023 FINAL  Final   Pertinent Lab.    Latest Ref Rng & Units 02/27/2023    8:59 AM 02/26/2023    4:26 AM 02/25/2023    4:39 AM  CBC  WBC 4.0 - 10.5 K/uL 18.0  19.8  22.8   Hemoglobin 13.0 - 17.0 g/dL 8.7  9.4  8.7   Hematocrit 39.0 - 52.0 % 24.7  26.7  24.2   Platelets 150 - 400 K/uL 671  459  467       Latest Ref Rng & Units 02/27/2023    8:59 AM 02/25/2023    4:39 AM 02/23/2023    9:30 AM  CMP  Glucose 70 - 99 mg/dL 578  469  629   BUN 6 - 20 mg/dL 16  17  16    Creatinine 0.61 - 1.24 mg/dL 5.28  4.13  2.44   Sodium 135 - 145 mmol/L 136  138  144   Potassium 3.5 - 5.1 mmol/L 4.3  3.9  3.8   Chloride 98 - 111 mmol/L 103  106  111   CO2 22 - 32 mmol/L 21  22  22    Calcium 8.9 - 10.3 mg/dL 8.5  8.2  8.5   Total Protein 6.5 - 8.1 g/dL 7.8     Total Bilirubin <1.2 mg/dL 0.8     Alkaline Phos 38 - 126 U/L 195     AST 15 - 41 U/L 36     ALT 0 - 44 U/L 61        Pertinent Imaging today Plain films and CT images have been personally visualized and interpreted; radiology reports have been reviewed. Decision making incorporated into the Impression  No results found.   I have personally spent 50 minutes involved in face-to-face and non-face-to-face activities for this patient on the day of the visit. Professional time spent includes the following activities: Preparing to see the patient (review of tests), Obtaining and/or reviewing separately obtained history (admission/discharge record), Performing a medically appropriate examination and/or evaluation , Ordering medications/tests/procedures, referring and communicating with other health care professionals, Documenting clinical information in the EMR,  Independently interpreting results (not separately reported), Communicating results to the patient/family/caregiver, Counseling and educating the patient/family/caregiver and Care coordination (not separately reported).   Plan d/w requesting provider as well as ID pharm D  Of note, portions of this note may have been created with voice recognition software. While this note has been edited for accuracy, occasional wrong-word or 'sound-a-like' substitutions may have occurred due to the inherent limitations of voice recognition software.  Electronically signed by:   Odette Fraction, MD Infectious Disease Physician [facility] for Infectious Disease Pager: [phone]

## 2023-02-28 LAB — COMPREHENSIVE METABOLIC PANEL
ALT: 49 U/L — ABNORMAL HIGH (ref 0–44)
AST: 37 U/L (ref 15–41)
Albumin: 2.2 g/dL — ABNORMAL LOW (ref 3.5–5.0)
Alkaline Phosphatase: 183 U/L — ABNORMAL HIGH (ref 38–126)
Anion gap: 11 (ref 5–15)
BUN: 13 mg/dL (ref 6–20)
CO2: 20 mmol/L — ABNORMAL LOW (ref 22–32)
Calcium: 8.1 mg/dL — ABNORMAL LOW (ref 8.9–10.3)
Chloride: 107 mmol/L (ref 98–111)
Creatinine, Ser: 1.13 mg/dL (ref 0.61–1.24)
GFR, Estimated: >60 mL/min (ref >60)
Glucose, Bld: 104 mg/dL — ABNORMAL HIGH (ref 70–99)
Potassium: 4.6 mmol/L (ref 3.5–5.1)
Sodium: 138 mmol/L (ref 135–145)
Total Bilirubin: 0.9 mg/dL (ref <1.2)
Total Protein: 7.4 g/dL (ref 6.5–8.1)

## 2023-02-28 LAB — GLUCOSE, CAPILLARY
Glucose-Capillary: 105 mg/dL — ABNORMAL HIGH (ref 70–99)
Glucose-Capillary: 96 mg/dL (ref 70–99)
Glucose-Capillary: 101 mg/dL — ABNORMAL HIGH (ref 70–99)
Glucose-Capillary: 96 mg/dL (ref 70–99)
Glucose-Capillary: 112 mg/dL — ABNORMAL HIGH (ref 70–99)
Glucose-Capillary: 112 mg/dL — ABNORMAL HIGH (ref 70–99)

## 2023-02-28 LAB — CBC WITH DIFFERENTIAL/PLATELET
Abs Immature Granulocytes: 0 10*3/uL (ref 0.00–0.07)
Basophils Absolute: 0.2 10*3/uL — ABNORMAL HIGH (ref 0.0–0.1)
Basophils Relative: 1 %
Eosinophils Absolute: 0.3 10*3/uL (ref 0.0–0.5)
Eosinophils Relative: 2 %
HCT: 23.9 % — ABNORMAL LOW (ref 39.0–52.0)
Hemoglobin: 8.4 g/dL — ABNORMAL LOW (ref 13.0–17.0)
Lymphocytes Relative: 10 %
Lymphs Abs: 1.6 10*3/uL (ref 0.7–4.0)
MCH: 26.3 pg (ref 26.0–34.0)
MCHC: 35.1 g/dL (ref 30.0–36.0)
MCV: 74.9 fL — ABNORMAL LOW (ref 80.0–100.0)
Monocytes Absolute: 1.8 10*3/uL — ABNORMAL HIGH (ref 0.1–1.0)
Monocytes Relative: 11 %
Neutro Abs: 12.3 10*3/uL — ABNORMAL HIGH (ref 1.7–7.7)
Neutrophils Relative %: 76 %
Platelets: 671 10*3/uL — ABNORMAL HIGH (ref 150–400)
RBC: 3.19 MIL/uL — ABNORMAL LOW (ref 4.22–5.81)
RDW: 17.4 % — ABNORMAL HIGH (ref 11.5–15.5)
WBC: 16.2 10*3/uL — ABNORMAL HIGH (ref 4.0–10.5)
nRBC: 2.2 % — ABNORMAL HIGH (ref 0.0–0.2)
nRBC: 9 /100{WBCs} — ABNORMAL HIGH

## 2023-02-28 LAB — PHOSPHORUS: Phosphorus: 2.9 mg/dL (ref 2.5–4.6)

## 2023-02-28 LAB — MAGNESIUM: Magnesium: 2.3 mg/dL (ref 1.7–2.4)

## 2023-02-28 MED ORDER — ACETAMINOPHEN 325 MG PO TABS
650.0000 mg | ORAL_TABLET | Freq: Four times a day (QID) | ORAL | Status: DC | PRN
  Administered 2023-03-01: 650 mg via ORAL
  Filled 2023-02-28: qty 2

## 2023-02-28 NOTE — Progress Notes (Signed)
Inpatient Rehab Admissions Coordinator:  Spoke with pt's aunt Misty Stanley on the telephone. She informed AC that family would not be able to provide 24/7 support. Due to lack of required support after discharge, will not continue to pursue pt for CIR. TOC made aware.  Wolfgang Phoenix, MS, CCC-SLP Admissions Coordinator [phone]

## 2023-02-28 NOTE — Progress Notes (Signed)
ID brief note   Afebrile for more than 24 hrs  He is in room air  Working with PT and OT  Clinically improving      Latest Ref Rng & Units 02/28/2023   11:00 AM 02/27/2023    8:59 AM 02/26/2023    4:26 AM  CBC  WBC 4.0 - 10.5 K/uL 16.2  18.0  19.8   Hemoglobin 13.0 - 17.0 g/dL 8.4  8.7  9.4   Hematocrit 39.0 - 52.0 % 23.9  24.7  26.7   Platelets 150 - 400 K/uL 671  671  459       Latest Ref Rng & Units 02/28/2023   11:00 AM 02/27/2023    8:59 AM 02/25/2023    4:39 AM  CMP  Glucose 70 - 99 mg/dL 161  096  045   BUN 6 - 20 mg/dL 13  16  17    Creatinine 0.61 - 1.24 mg/dL 4.09  8.11  9.14   Sodium 135 - 145 mmol/L 138  136  138   Potassium 3.5 - 5.1 mmol/L 4.6  4.3  3.9   Chloride 98 - 111 mmol/L 107  103  106   CO2 22 - 32 mmol/L 20  21  22    Calcium 8.9 - 10.3 mg/dL 8.1  8.5  8.2   Total Protein 6.5 - 8.1 g/dL 7.4  7.8    Total Bilirubin <1.2 mg/dL 0.9  0.8    Alkaline Phos 38 - 126 U/L 183  195    AST 15 - 41 U/L 37  36    ALT 0 - 44 U/L 49  61     Complete 5-7 days of Unasyn  for possible aspiration PNA if continues to improve with no new concerns.   If ready for DC prior to completion of course, can switch to PO augmentin   ID will so, please recall if needed or concerns   Odette Fraction, MD Infectious Disease Physician [facility] for Infectious Disease [address] [place], [place] [address] Phone: [phone]  Fax: [phone]

## 2023-02-28 NOTE — Plan of Care (Signed)
  Problem: Education: Goal: Ability to describe self-care measures that may prevent or decrease complications (Diabetes Survival Skills Education) will improve Outcome: Progressing Goal: Individualized Educational Video(s) Outcome: Progressing   Problem: Coping: Goal: Ability to adjust to condition or change in health will improve Outcome: Progressing   Problem: Fluid Volume: Goal: Ability to maintain a balanced intake and output will improve Outcome: Progressing   Problem: Health Behavior/Discharge Planning: Goal: Ability to identify and utilize available resources and services will improve Outcome: Progressing Goal: Ability to manage health-related needs will improve Outcome: Progressing   Problem: Metabolic: Goal: Ability to maintain appropriate glucose levels will improve Outcome: Progressing   Problem: Nutritional: Goal: Maintenance of adequate nutrition will improve Outcome: Progressing Goal: Progress toward achieving an optimal weight will improve Outcome: Progressing   Problem: Skin Integrity: Goal: Risk for impaired skin integrity will decrease Outcome: Progressing   Problem: Tissue Perfusion: Goal: Adequacy of tissue perfusion will improve Outcome: Progressing   Problem: Safety: Goal: Non-violent Restraint(s) Outcome: Progressing   Problem: Education: Goal: Knowledge of General Education information will improve Description: Including pain rating scale, medication(s)/side effects and non-pharmacologic comfort measures Outcome: Progressing   Problem: Health Behavior/Discharge Planning: Goal: Ability to manage health-related needs will improve Outcome: Progressing   Problem: Clinical Measurements: Goal: Ability to maintain clinical measurements within normal limits will improve Outcome: Progressing Goal: Will remain free from infection Outcome: Progressing Goal: Diagnostic test results will improve Outcome: Progressing Goal: Respiratory complications  will improve Outcome: Progressing Goal: Cardiovascular complication will be avoided Outcome: Progressing   Problem: Education: Goal: Knowledge of disease or condition will improve Outcome: Progressing Goal: Knowledge of secondary prevention will improve (MUST DOCUMENT ALL) Outcome: Progressing Goal: Knowledge of patient specific risk factors will improve [name] N/A or DELETE if not current risk factor) Outcome: Progressing

## 2023-02-28 NOTE — TOC Progression Note (Signed)
Transition of Care [facility]) - Progression Note    Patient Details  Name: Peter Kline MRN: [id number] Date of Birth: 06/10/1965  Transition of Care [facility]) CM/SW Contact  [name], [place] Phone Number: 02/28/2023, 2:16 PM  Clinical Narrative:   CSW updated by PT and OT that patient continues to be appropriate for AIR. CSW met with patient and aunt at bedside to discuss option for [facility] as CIR has declined to offer a bed. Patient and family in agreement. CSW updated [facility] liaison, they will initiate insurance authorization request. CSW to follow.    Expected Discharge Plan: IP Rehab Facility    Expected Discharge Plan and Services                                               Social Determinants of Health (SDOH) Interventions SDOH Screenings   Food Insecurity: No Food Insecurity (02/25/2023)  Housing: Patient Declined (02/25/2023)  Transportation Needs: No Transportation Needs (02/25/2023)  Utilities: Not At Risk (02/25/2023)  Depression (PHQ2-9): Low Risk  (11/25/2018)  Financial Resource Strain: Low Risk  (11/15/2021)   Received from [facility]  Tobacco Use: High Risk (02/11/2023)    Readmission Risk Interventions     No data to display

## 2023-02-28 NOTE — Progress Notes (Signed)
Physical Therapy Treatment Patient Details Name: Peter Kline MRN: [id number] DOB: Apr 10, 1966 Today's Date: 02/28/2023   History of Present Illness Pt is a 57 y/o male presenting to the ED on 10/30 with AMS, found wandering the streets of [facility] confused with slurred speech.  Initially negative for stroke.  Mental status failed to improve, pt became more agitated and eventually was intubated for airway protection.  Chest xray showed multifocal PNA.  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    PT Comments  Pt greeted resting in bed and agreeable to session with continued progress towards mobility goals, however pt continues to be limited by poor safety awareness,poor insight into deficits, impaired balance/postural reactions and impaired coordination. Pt requiring grossly CGA for bed mobility and transfers with steady rise to standing this session. Pt needing up to mod A to maintain balance during gait with SPC as pt with x3 R lateral LOB with pt demonstrating poor/zero hip/knee strategy to correct. Pt also needing max cues for safety and optimal use as pt switching cane between hands and keeping cane lifted off floor. Pt with improved stability with rollator support requiring min A to maintain balance and max cues for navigation. Pt up in chair at end of session with chair alarm set, door open and RN aware. Current plan remains appropriate to address deficits and maximize functional independence and safety and decrease caregiver burden. Pt continues to benefit from skilled PT services to progress toward functional mobility goals.     If plan is discharge home, recommend the following: A lot of help with walking and/or  transfers;A lot of help with bathing/dressing/bathroom;Assistance with cooking/housework;Direct supervision/assist for medications management;Direct supervision/assist for financial management;Assist for transportation;Help with stairs or ramp for entrance   Can travel by private vehicle        Equipment Recommendations  Other (comment) (TBD)    Recommendations for Other Services       Precautions / Restrictions Precautions Precautions: Fall Restrictions Weight Bearing Restrictions: No     Mobility  Bed Mobility Overal bed mobility: Modified Independent Bed Mobility: Supine to Sit, Sit to Supine     Supine to sit: Contact guard     General bed mobility comments: CGA for safety    Transfers Overall transfer level: Needs assistance Equipment used: Rolling walker (2 wheels) Transfers: Sit to/from Stand Sit to Stand: Contact guard assist           General transfer comment: no posterior lean today, CGA with SPC    Ambulation/Gait Ambulation/Gait assistance: Mod assist, Min assist Gait Distance (Feet): 100 Feet (+ 300) Assistive device: Rollator (4 wheels), Straight cane Gait Pattern/deviations: Step-through pattern, Decreased stance time - right, Decreased stride length, Drifts right/left Gait velocity: decr     General Gait Details: mod A to maintain balance with SPC as pt with x3 LOB to R with zero hip knee strategy to regain, pt picking up and waving cane and switching between hands depsite education on optimal hand placement, improved stability with rollator support with no overt LOB iwht min A to steady   Optometrist     Tilt Bed    Modified  Rankin (Stroke Patients Only)       Balance Overall balance assessment: Needs assistance Sitting-balance support: No upper extremity supported, Feet supported Sitting balance-Leahy Scale: Good Sitting balance - Comments: can bend forward to doff and donn one sock and then cross  one leg over the other to doff and donn the other sock without LOB either way   Standing balance support: Bilateral upper extremity supported, Reliant on assistive device for balance Standing balance-Leahy Scale: Poor Standing balance comment: reliant on UE support                            Cognition Arousal: Alert Behavior During Therapy: WFL for tasks assessed/performed Overall Cognitive Status: Impaired/Different from baseline Area of Impairment: Attention, Following commands, Safety/judgement, Awareness                 Orientation Level: Disoriented to, Place, Situation, Time (pointing out the window and stating his wife is jsut over there, unable to state hospital despite redirection) Current Attention Level: Sustained   Following Commands: Follows one step commands consistently, Follows multi-step commands inconsistently Safety/Judgement: Decreased awareness of safety, Decreased awareness of deficits Awareness: Emergent Problem Solving: Slow processing, Decreased initiation General Comments: pt with improved impulsivity however demonstrates decreased insight into deficits wanting to ambulate without AD despite multiple LOB with cane        Exercises      General Comments General comments (skin integrity, edema, etc.): VSS on RA      Pertinent Vitals/Pain Pain Assessment Pain Assessment: No/denies pain Pain Intervention(s): Monitored during session    Home Living                          Prior Function            PT Goals (current goals can now be found in the care plan section) Acute Rehab PT Goals Patient Stated Goal: to take a shower PT Goal Formulation: Patient unable to participate in goal setting Time For Goal Achievement: 03/06/23 Progress towards PT goals: Progressing toward goals    Frequency    Min 1X/week      PT Plan      Co-evaluation              AM-PAC PT "6 Clicks" Mobility   Outcome Measure  Help  needed turning from your back to your side while in a flat bed without using bedrails?: A Lot Help needed moving from lying on your back to sitting on the side of a flat bed without using bedrails?: A Lot Help needed moving to and from a bed to a chair (including a wheelchair)?: A Little Help needed standing up from a chair using your arms (e.g., wheelchair or bedside chair)?: A Little Help needed to walk in hospital room?: A Lot Help needed climbing 3-5 steps with a railing? : Total 6 Click Score: 13    End of Session Equipment Utilized During Treatment: Gait belt Activity Tolerance: Patient tolerated treatment well Patient left: with call bell/phone within reach;in chair;with chair alarm set (with door open and RN aware on pt position) Nurse Communication: Mobility status PT Visit Diagnosis: Other abnormalities of gait and mobility (R26.89);Muscle weakness (generalized) (M62.81);Other symptoms and signs involving the nervous system (R29.898)     Time: 1610-9604 PT Time Calculation (min) (ACUTE ONLY): 21 min  Charges:    $Gait Training: 8-22 mins PT General Charges $$ ACUTE  PT VISIT: 1 Visit                     Monika Chestang R. PTA Acute Rehabilitation Services Office: [phone]   Catalina Antigua 02/28/2023, 10:36 AM

## 2023-02-28 NOTE — Progress Notes (Signed)
Occupational Therapy Treatment Patient Details Name: Peter Kline MRN: [id number] DOB: Mar 26, 1966 Today's Date: 02/28/2023   History of present illness Pt is a 57 y/o male presenting to the ED on 10/30 with AMS, found wandering the streets of [facility] confused with slurred speech.  Initially negative for stroke.  Mental status failed to improve, pt became more agitated and eventually was intubated for airway protection.  Chest xray showed multifocal PNA.  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   OT comments  Pt. Seen for skilled OT treatment session.  Pt.s aunt and her spouse present for session.  Pt. Able to complete bed mobility, standing grooming tasks, and transfers with cga/min a and cues/support for safety and sequencing.  Attempting to sit before he had reached the chair and required physical assistance to stop the action and assistance and cues to cont. To back up before sitting down.  Education, demonstration provided as to why this was not safe and ways to always check before you sit down.  Pt. Verbalized understanding but when asked later in the session to describe the safe transfer tech. We had reviewed he did not remember them.   Aunt present and reports she can provide 24/7 at d/c.  She and pt. Are very hopeful he can go to [facility] for cont. Therapies.  Given cognitive and mobility deficits that are presenting he remains an excellent candidate for [facility].        If plan is discharge home, recommend the following:  A little help with walking and/or transfers;A little help with bathing/dressing/bathroom;Assist for transportation;Assistance with cooking/housework;Help with stairs or ramp for entrance;Direct  supervision/assist for financial management;Direct supervision/assist for medications management   Equipment Recommendations  None recommended by OT    Recommendations for Other Services      Precautions / Restrictions Precautions Precautions: Fall Restrictions Weight Bearing Restrictions: No       Mobility Bed Mobility Overal bed mobility: Needs Assistance Bed Mobility: Supine to Sit           General bed mobility comments: CGA for safety    Transfers Overall transfer level: Needs assistance Equipment used: Rolling walker (2 wheels) Transfers: Sit to/from Stand, Bed to chair/wheelchair/BSC Sit to Stand: Contact guard assist Transfer to chair with min a. Pt. Attempting to sit when not back close to the chair. Physical assistance to stop him from sitting and provide verbal and physical assistance to cont. To back up until he felt the chair touching his legs. Max cues to also reach back for the arm rests.  When reviewing this pt. Could not state or repeat why it was dangerous to sit without knowing if you are close enough to the chair.  Explained again and provided visual demonstration.                   Balance                                           ADL either performed or assessed with clinical judgement   ADL Overall ADL's : Needs assistance/impaired     Grooming: Wash/dry hands;Oral  care;Contact guard assist;Standing;Cueing for sequencing Grooming Details (indicate cue type and reason): allowed initiation of tasks without cues or guidance, only offered if safety was an issue.  pt. holding tube of t.pase in L hand while holding onto walker and brushing with R hand, assistance/cues to remove the tube of t. paste and place on the counter to allow for safer grasp on rw                 Toilet Transfer: Contact guard assist;Rolling walker (2 wheels) Toilet Transfer Details (indicate cue type and reason): simulated eob in room ambulation to  counter and back to recliner at end of session         Functional mobility during ADLs: Minimal assistance;Rolling walker (2 wheels)      Extremity/Trunk Assessment              Vision       Perception     Praxis      Cognition Arousal: Alert Behavior During Therapy: WFL for tasks assessed/performed Overall Cognitive Status: Impaired/Different from baseline Area of Impairment: Attention, Following commands, Safety/judgement, Awareness                 Orientation Level: Disoriented to, Place, Situation, Time Current Attention Level: Sustained   Following Commands: Follows one step commands consistently, Follows multi-step commands inconsistently, Follows one step commands inconsistently, Follows one step commands with increased time Safety/Judgement: Decreased awareness of safety, Decreased awareness of deficits Awareness: Emergent Problem Solving: Slow processing, Decreased initiation General Comments: pt with improved impulsivity however demonstrates decreased insight into deficits        Exercises      Shoulder Instructions       General Comments VSS on RA    Pertinent Vitals/ Pain       Pain Assessment Pain Assessment: No/denies pain  Home Living                                          Prior Functioning/Environment              Frequency  Min 1X/week        Progress Toward Goals  OT Goals(current goals can now be found in the care plan section)  Progress towards OT goals: Progressing toward goals     Plan      Co-evaluation                 AM-PAC OT "6 Clicks" Daily Activity     Outcome Measure   Help from another person eating meals?: None Help from another person taking care of personal grooming?: A Little Help from another person toileting, which includes using toliet, bedpan, or urinal?: A Little Help from another person bathing (including washing, rinsing, drying)?: A Little Help from  another person to put on and taking off regular upper body clothing?: A Little Help from another person to put on and taking off regular lower body clothing?: A Little 6 Click Score: 19    End of Session Equipment Utilized During Treatment: Gait belt;Rolling walker (2 wheels)  OT Visit Diagnosis: Unsteadiness on feet (R26.81);Other abnormalities of gait and mobility (R26.89);Other symptoms and signs involving cognitive function   Activity Tolerance Patient tolerated treatment well   Patient Left in chair;with call bell/phone within reach;with chair alarm set   Nurse Communication  Time: 4540-9811 OT Time Calculation (min): 26 min  Charges: OT General Charges $OT Visit: 1 Visit OT Treatments $Self Care/Home Management : 23-37 mins  Boneta Lucks, COTA/L Acute Rehabilitation [phone]   Alessandra Bevels Lorraine-COTA/L 02/28/2023, 1:19 PM

## 2023-02-28 NOTE — Progress Notes (Signed)
PROGRESS NOTE    Peter Kline  OZD:[id number] DOB: Nov 09, 1965 DOA: 02/11/2023 PCP: Peter Angst, MD  Chief Complaint  Patient presents with   Altered Mental Status    Brief Narrative:   Peter Kline is Peter Kline 57 y.o. male with Peter Kline history of sickle cell c disease with priapism, GERD, chronic pain, retinal hemorrhage, tobacco use, marijuana use, vitamin d deficiency. Patient presented secondary to altered mental status and required ICU admission for airway protection. Workup significant for acute strokes, acute DVT, aspiration pneumonia with hospitalization complicated by sepsis. Workup negative for CNS infection. Patient improved enough for extubation and transfer to the floor, with persistent fevers.   Assessment & Plan:   Principal Problem:   AMS (altered mental status) Active Problems:   Sepsis with encephalopathy without septic shock (HCC)   Encephalopathy acute   Acute respiratory failure with hypoxia (HCC)   Community acquired pneumonia   Protein-calorie malnutrition, severe   Cerebrovascular accident (CVA) due to thrombosis of precerebral artery (HCC)   Paradoxical embolism (HCC)   HAP (hospital-acquired pneumonia)   Aspiration pneumonia of both lungs (HCC)  Acute metabolic encephalopathy Acute toxic encephalopathy Multifactorial in setting of polysubstance abuse and strokes, in addition to likely aspiration pneumonia. Infectious workup negative for CNS infection. Patient continues to get closer to baseline.   Acute embolic CVA MRI on 10/31 confirmed Peter Kline subcentimeter acute infarct in the left occipital white matter in addition to MRI on 11/3 significant for scattered small new acute infarcts in the bilateral cerebral and cerebellar hemispheres. CTA head and neck negative for large vessel occlusion or other emergent finding. LDL of 49. Hemoglobin A1C of 4.8%. Transthoracic Echocardiogram significant for normal LVEF of 55-60% with grade 2 diastolic dysfunction and no  atrial level shunt. Transesophageal Echocardiogram significant for positive bubble study suggesting PFO. Neurology recommendations for Heparin drip with transition to Eliquis when able. PT/OT recommendations for likely SNF pending cognitive improvement. Heparin discontinued -neurology c/s, appreciate recs (signed off 11/8) - bilateral cerebral and cerebellar hemisphere strokes from paradoxical embolism due to R to L cardiac shunt and presence of DVT with concurrent small vessel disease.  Neurology recommending eliquis. Statin not indicated as LDL within goal.    Aspiration pneumonia Recurrent Fevers Sepsis Staph Epidermidis Pneumonia Acute respiratory failure with hypoxia Related to acute encephalopathy and aspiration pneumonia  CT chest with patchy airspace consolidation in bilateral lower lobes, L>R and minimally in RUL concerning for multifocal pneumonia - secretions in distal trachea and L mainstem bronchus Appreciate ID assistance -> transitioned to unasyn.  Plan for 5-7 days, transition to augmentin once ready for d/c.  Recommending blood cultures if recurrent fevers.     Acute bilateral lower extremity DVT Acute DVT involving the left posterior tibial veins and left peroneal veins. Age indeterminate DVT involving the right posterior tibial veins and right peroneal veins. Patient managed on heparin and transitioned to Eliquis. -Continue Eliquis PO   Dysuria Urine culture with no growth. Symptoms are improved.   Hypernatremia improved   Sickle cell C disease Noted. Patient is on folic acid as an outpatient. -Continue folic acid   GERD Noted.   Dysphagia Improved, SLP recommending regular/thin liquids on 11/12 NG tube discontinued 11/13, tolerating diet well     DVT prophylaxis: eliquis Code Status: full Family Communication: none Disposition:   Status is: Inpatient Remains inpatient appropriate because: need for continued inpatient care   Consultants:   ID  Procedures:  LE [place] Summary:  BILATERAL:  -No evidence  of popliteal cyst, bilaterally.  RIGHT:   - Findings consistent with age indeterminate deep vein thrombosis  involving the right posterior tibial veins, and right peroneal veins.        LEFT:  - Findings consistent with acute deep vein thrombosis involving the left  posterior tibial veins, and left peroneal veins.   TEE IMPRESSIONS     1. Left ventricular ejection fraction, by estimation, is 55 to 60%. The  left ventricle has normal function. The left ventricle has no regional  wall motion abnormalities.   2. Right ventricular systolic function is normal. The right ventricular  size is normal. Tricuspid regurgitation signal is inadequate for assessing  PA pressure.   3. Left atrial size was mildly dilated. No left atrial/left atrial  appendage thrombus was detected.   4. The mitral valve is normal in structure. Trivial mitral valve  regurgitation. No evidence of mitral stenosis.   5. The aortic valve is tricuspid. Aortic valve regurgitation is not  visualized. No aortic stenosis is present.   6. Bubble study positive suggesting PFO.   Echo IMPRESSIONS     1. Left ventricular ejection fraction, by estimation, is 55 to 60%. The  left ventricle has normal function. The left ventricle has no regional  wall motion abnormalities. Left ventricular diastolic parameters are  consistent with Grade II diastolic  dysfunction (pseudonormalization).   2. RV-RA gradient 33 mmHg suggesting at least mild to moderate increase  in RVSP depending on CVP. Right ventricular systolic function is normal.  The right ventricular size is normal.   3. Left atrial size was moderately dilated.   4. The mitral valve is abnormal, mildly calcified with somewhat  restricted posterior leaflet. Mild to moderate mitral valve regurgitation.   5. The aortic valve is tricuspid. Aortic valve regurgitation is not  visualized.   6. The inferior  vena cava is normal in size with <50% respiratory  variability, suggesting right atrial pressure of 8 mmHg.   Comparison(s): No prior Echocardiogram.    Antimicrobials:  Anti-infectives (From admission, onward)    Start     Dose/Rate Route Frequency Ordered Stop   02/27/23 1330  Ampicillin-Sulbactam (UNASYN) 3 g in sodium chloride 0.9 % 100 mL IVPB        3 g 200 mL/hr over 30 Minutes Intravenous Every 6 hours 02/27/23 1237     02/26/23 2200  linezolid (ZYVOX) tablet 600 mg  Status:  Discontinued        600 mg Oral Every 12 hours 02/26/23 1016 02/27/23 1237   02/23/23 2100  vancomycin (VANCOREADY) IVPB 1250 mg/250 mL  Status:  Discontinued        1,250 mg 166.7 mL/hr over 90 Minutes Intravenous Every 12 hours 02/23/23 1144 02/26/23 1016   02/20/23 2100  vancomycin (VANCOREADY) IVPB 1500 mg/300 mL  Status:  Discontinued        1,500 mg 150 mL/hr over 120 Minutes Intravenous Every 12 hours 02/20/23 0929 02/23/23 1144   02/20/23 0830  vancomycin (VANCOREADY) IVPB 1500 mg/300 mL        1,500 mg 150 mL/hr over 120 Minutes Intravenous  Once 02/20/23 0744 02/20/23 1128   02/18/23 1945  piperacillin-tazobactam (ZOSYN) IVPB 3.375 g  Status:  Discontinued        3.375 g 12.5 mL/hr over 240 Minutes Intravenous Every 8 hours 02/18/23 1859 02/20/23 1452   02/17/23 0800  cefTRIAXone (ROCEPHIN) 2 g in sodium chloride 0.9 % 100 mL IVPB  2 g 200 mL/hr over 30 Minutes Intravenous Every 24 hours 02/17/23 0714 02/19/23 1936   02/14/23 1200  Ampicillin-Sulbactam (UNASYN) 3 g in sodium chloride 0.9 % 100 mL IVPB  Status:  Discontinued        3 g 200 mL/hr over 30 Minutes Intravenous Every 6 hours 02/14/23 1053 02/17/23 0740   02/14/23 1000  cefTRIAXone (ROCEPHIN) 2 g in sodium chloride 0.9 % 100 mL IVPB  Status:  Discontinued        2 g 200 mL/hr over 30 Minutes Intravenous Every 24 hours 02/13/23 1318 02/14/23 1052   02/13/23 1000  doxycycline (VIBRA-TABS) tablet 100 mg  Status:  Discontinued         100 mg Per Tube Every 12 hours 02/13/23 0853 02/14/23 1052   02/12/23 1000  cefTRIAXone (ROCEPHIN) 2 g in sodium chloride 0.9 % 100 mL IVPB  Status:  Discontinued        2 g 200 mL/hr over 30 Minutes Intravenous Every 12 hours 02/12/23 0638 02/13/23 1318   02/12/23 0800  ampicillin (OMNIPEN) 2 g in sodium chloride 0.9 % 100 mL IVPB  Status:  Discontinued        2 g 300 mL/hr over 20 Minutes Intravenous Every 4 hours 02/12/23 0638 02/13/23 0853   02/12/23 0800  vancomycin (VANCOCIN) IVPB 1000 mg/200 mL premix  Status:  Discontinued        1,000 mg 200 mL/hr over 60 Minutes Intravenous Every 8 hours 02/12/23 0703 02/13/23 0853   02/11/23 2300  acyclovir (ZOVIRAX) 825 mg in dextrose 5 % 250 mL IVPB  Status:  Discontinued        10 mg/kg  82.6 kg 266.5 mL/hr over 60 Minutes Intravenous Every 8 hours 02/11/23 2251 02/13/23 0853   02/11/23 2145  vancomycin (VANCOREADY) IVPB 1750 mg/350 mL        1,750 mg 175 mL/hr over 120 Minutes Intravenous  Once 02/11/23 2137 02/12/23 0231   02/11/23 2145  ampicillin (OMNIPEN) 2 g in sodium chloride 0.9 % 100 mL IVPB        2 g 300 mL/hr over 20 Minutes Intravenous  Once 02/11/23 2138 02/11/23 2316   02/11/23 2115  cefTRIAXone (ROCEPHIN) 2 g in sodium chloride 0.9 % 100 mL IVPB        2 g 200 mL/hr over 30 Minutes Intravenous  Once 02/11/23 2101 02/11/23 2223       Subjective: No new complaints  Objective: Vitals:   02/28/23 0416 02/28/23 0500 02/28/23 0942 02/28/23 1157  BP: 121/82  126/73 126/82  Pulse: 86  91 98  Resp: 16  17 18   Temp: 98.6 F (37 C)  98.3 F (36.8 C)   TempSrc:   Oral   SpO2: 97%  96% 97%  Weight:  79 kg    Height:        Intake/Output Summary (Last 24 hours) at 02/28/2023 1604 Last data filed at 02/28/2023 0700 Gross per 24 hour  Intake 1280 ml  Output 1810 ml  Net -530 ml   Filed Weights   02/26/23 0716 02/27/23 0500 02/28/23 0500  Weight: 77.9 kg 79.1 kg 79 kg    Examination:  General: No acute  distress.  Sitting up in chair. Cardiovascular: RRR Lungs: unlabored Neurological: Alert and oriented 3. Moves all extremities 4 with equal strength. Cranial nerves II through XII grossly intact. Extremities: No clubbing or cyanosis. No edema.   Data Reviewed: I have personally reviewed following labs and imaging studies  CBC: Recent Labs  Lab 02/24/23 0607 02/25/23 0439 02/26/23 0426 02/27/23 0859 02/28/23 1100  WBC 25.0* 22.8* 19.8* 18.0* 16.2*  NEUTROABS  --   --   --  13.5* 12.3*  HGB 8.5* 8.7* 9.4* 8.7* 8.4*  HCT 24.4* 24.2* 26.7* 24.7* 23.9*  MCV 74.8* 74.2* 74.4* 74.0* 74.9*  PLT 383 467* 459* 671* 671*    Basic Metabolic Panel: Recent Labs  Lab 02/22/23 0428 02/23/23 0930 02/25/23 0439 02/27/23 0859 02/28/23 1100  NA 149* 144 138 136 138  K 3.8 3.8 3.9 4.3 4.6  CL 114* 111 106 103 107  CO2 25 22 22  21* 20*  GLUCOSE 130* 122* 129* 119* 104*  BUN 20 16 17 16 13   CREATININE 1.10 0.88 0.77 1.09 1.13  CALCIUM 8.7* 8.5* 8.2* 8.5* 8.1*  MG 2.8*  --   --  2.3 2.3  PHOS  --   --   --  3.3 2.9    GFR: Estimated Creatinine Clearance: 78.2 mL/min (by C-G formula based on SCr of 1.13 mg/dL).  Liver Function Tests: Recent Labs  Lab 02/27/23 0859 02/28/23 1100  AST 36 37  ALT 61* 49*  ALKPHOS 195* 183*  BILITOT 0.8 0.9  PROT 7.8 7.4  ALBUMIN 2.1* 2.2*    CBG: Recent Labs  Lab 02/27/23 1137 02/27/23 1534 02/28/23 0029 02/28/23 0416 02/28/23 0809  GLUCAP 120* 90 96 105* 101*     Recent Results (from the past 240 hour(s))  Urine Culture     Status: None   Collection Time: 02/21/23  9:47 AM   Specimen: Urine, Clean Catch  Result Value Ref Range Status   Specimen Description URINE, CLEAN CATCH  Final   Special Requests NONE  Final   Culture   Final    NO GROWTH Performed at [facility], [address], [place], [place] [address]    Report Status 02/22/2023 FINAL  Final  Culture, blood (Routine X 2) w Reflex to ID Panel     Status: None    Collection Time: 02/21/23  1:21 PM   Specimen: BLOOD RIGHT ARM  Result Value Ref Range Status   Specimen Description BLOOD RIGHT ARM  Final   Special Requests   Final    BOTTLES DRAWN AEROBIC AND ANAEROBIC Blood Culture results may not be optimal due to an inadequate volume of blood received in culture bottles   Culture   Final    NO GROWTH 5 DAYS Performed at [facility], [address], [place], [place] [address]    Report Status 02/26/2023 FINAL  Final  Culture, blood (Routine X 2) w Reflex to ID Panel     Status: None   Collection Time: 02/21/23  1:24 PM   Specimen: BLOOD RIGHT WRIST  Result Value Ref Range Status   Specimen Description BLOOD RIGHT WRIST  Final   Special Requests   Final    BOTTLES DRAWN AEROBIC AND ANAEROBIC Blood Culture results may not be optimal due to an inadequate volume of blood received in culture bottles   Culture   Final    NO GROWTH 5 DAYS Performed at [facility], [address], [place], [place] [address]    Report Status 02/26/2023 FINAL  Final         Radiology Studies: No results found.      Scheduled Meds:  acetaminophen  1,000 mg Oral Q6H   apixaban  5 mg Oral BID   busPIRone  30  mg Oral Q8H   Or   busPIRone  30 mg Per Tube Q8H   feeding supplement  237 mL Oral BID BM   folic acid  1 mg Oral Daily   insulin aspart  0-9 Units Subcutaneous Q4H   multivitamin with minerals  1 tablet Oral Daily   nicotine  14 mg Transdermal Daily   mouth rinse  15 mL Mouth Rinse 4 times per day   sodium chloride flush  10 mL Intravenous Q12H   thiamine  100 mg Oral Daily   Continuous Infusions:  ampicillin-sulbactam (UNASYN) IV 3 g (02/28/23 1301)     LOS: 16 days    Time spent: over 30 min     Lacretia Nicks, MD [facility]   To contact the attending provider between 7A-7P or the covering provider during after hours 7P-7A, please log into the web site [email or web address] and access using universal [place]  password for that web site. If you do not have the password, please call the hospital operator.  02/28/2023, 4:04 PM

## 2023-02-28 NOTE — Progress Notes (Signed)
PHARMACY - ANTICOAGULATION CONSULT NOTE  Pharmacy Consult:  apixaban Indication: DVT  No Known Allergies  Patient Measurements: Height: 6' 0.01" (182.9 cm) Weight: 79 kg (174 lb 2.6 oz) IBW/kg (Calculated) : 77.62 Heparin Dosing Weight: 70 kg  Labs: Recent Labs    02/26/23 0426 02/27/23 0859 02/28/23 1100  HGB 9.4* 8.7* 8.4*  HCT 26.7* 24.7* 23.9*  PLT 459* 671* 671*  CREATININE  --  1.09 1.13    Estimated Creatinine Clearance: 78.2 mL/min (by C-G formula based on SCr of 1.13 mg/dL).  Assessment: 65 YOM with multifactorial encephalopathy.  Pharmacy consulted to dose IV heparin per stroke protocol for acute L DVT and age indeterminate R DVT.  Patient has multiple embolic CVAs.  No anticoagulation prior to admission.   Heparin levels have been subtherapeutic for 24 hours. Difficulty getting levels on time due to staffing. Discussed with MD who was ok with switching to enoxaparin.  Team planning to swtich to apixaban when can take PO meds.  11/8 pm - okay to switch to apixaban. Discussed with neuro PA and okay with apixaban 5mg  per tube BID (no 7-day loading dose with recent CVA). Last dose of Lovenox this morning at 6am.  11/15 - renal function stable, no bleeding noted, Hgb low stable 8-9s, platelets are elevated.  Plan:   Continue apixaban 5 mg PO BID Educated prior to discharge Pharmacy signing off but will continue to follow peripherally - please re-consult if needed  Thank you for involving pharmacy in this patient's care.  Loura Back, PharmD, BCPS Clinical Pharmacist Clinical phone for 02/28/2023 is [phone] 02/28/2023 1:47 PM

## 2023-02-28 NOTE — TOC Progression Note (Signed)
Transition of Care [facility]) - Progression Note    Patient Details  Name: Peter Kline MRN: [id number] Date of Birth: August 11, 1965  [facility] CM/SW Contact  [name], [place] Phone Number: 02/28/2023, 2:14 PM  Clinical Narrative:   CSW updated by [facility] that they can offer a bed for patient, but patient is not sure if he wants to go that far. Patient to discuss with family, possibly hopeful that he could return home tomorrow if therapy clears him. CSW to follow.    Expected Discharge Plan: Home w Home Health Services    Expected Discharge Plan and Services                                               Social Determinants of Health (SDOH) Interventions SDOH Screenings   Food Insecurity: No Food Insecurity (02/25/2023)  Housing: Patient Declined (02/25/2023)  Transportation Needs: No Transportation Needs (02/25/2023)  Utilities: Not At Risk (02/25/2023)  Depression (PHQ2-9): Low Risk  (11/25/2018)  Financial Resource Strain: Low Risk  (11/15/2021)   Received from [facility]  Tobacco Use: High Risk (02/11/2023)    Readmission Risk Interventions     No data to display

## 2023-03-01 LAB — CBC WITH DIFFERENTIAL/PLATELET
Abs Immature Granulocytes: 0.14 10*3/uL — ABNORMAL HIGH (ref 0.00–0.07)
Abs Immature Granulocytes: 0.17 10*3/uL — ABNORMAL HIGH (ref 0.00–0.07)
Basophils Absolute: 0.1 10*3/uL (ref 0.0–0.1)
Basophils Absolute: 0.2 10*3/uL — ABNORMAL HIGH (ref 0.0–0.1)
Basophils Relative: 1 %
Basophils Relative: 1 %
Eosinophils Absolute: 0.7 10*3/uL — ABNORMAL HIGH (ref 0.0–0.5)
Eosinophils Absolute: 0.5 10*3/uL (ref 0.0–0.5)
Eosinophils Relative: 5 %
Eosinophils Relative: 3 %
HCT: 22.7 % — ABNORMAL LOW (ref 39.0–52.0)
HCT: 24.9 % — ABNORMAL LOW (ref 39.0–52.0)
Hemoglobin: 8 g/dL — ABNORMAL LOW (ref 13.0–17.0)
Hemoglobin: 8.8 g/dL — ABNORMAL LOW (ref 13.0–17.0)
Immature Granulocytes: 1 %
Immature Granulocytes: 1 %
Lymphocytes Relative: 17 %
Lymphocytes Relative: 15 %
Lymphs Abs: 2.5 10*3/uL (ref 0.7–4.0)
Lymphs Abs: 2.4 10*3/uL (ref 0.7–4.0)
MCH: 26.4 pg (ref 26.0–34.0)
MCH: 26.3 pg (ref 26.0–34.0)
MCHC: 35.2 g/dL (ref 30.0–36.0)
MCHC: 35.3 g/dL (ref 30.0–36.0)
MCV: 74.9 fL — ABNORMAL LOW (ref 80.0–100.0)
MCV: 74.6 fL — ABNORMAL LOW (ref 80.0–100.0)
Monocytes Absolute: 1.3 10*3/uL — ABNORMAL HIGH (ref 0.1–1.0)
Monocytes Absolute: 0.6 10*3/uL (ref 0.1–1.0)
Monocytes Relative: 9 %
Monocytes Relative: 4 %
Neutro Abs: 9.8 10*3/uL — ABNORMAL HIGH (ref 1.7–7.7)
Neutro Abs: 12.2 10*3/uL — ABNORMAL HIGH (ref 1.7–7.7)
Neutrophils Relative %: 67 %
Neutrophils Relative %: 77 %
Platelets: 995 10*3/uL (ref 150–400)
Platelets: 1093 10*3/uL (ref 150–400)
RBC: 3.03 MIL/uL — ABNORMAL LOW (ref 4.22–5.81)
RBC: 3.34 MIL/uL — ABNORMAL LOW (ref 4.22–5.81)
RDW: 17.3 % — ABNORMAL HIGH (ref 11.5–15.5)
RDW: 17.2 % — ABNORMAL HIGH (ref 11.5–15.5)
Smear Review: INCREASED
Smear Review: INCREASED
WBC: 14.5 10*3/uL — ABNORMAL HIGH (ref 4.0–10.5)
WBC: 15.8 10*3/uL — ABNORMAL HIGH (ref 4.0–10.5)
nRBC: 3.4 % — ABNORMAL HIGH (ref 0.0–0.2)
nRBC: 11 /100{WBCs} — ABNORMAL HIGH
nRBC: 3.9 % — ABNORMAL HIGH (ref 0.0–0.2)

## 2023-03-01 LAB — BASIC METABOLIC PANEL
Anion gap: 9 (ref 5–15)
BUN: 12 mg/dL (ref 6–20)
CO2: 22 mmol/L (ref 22–32)
Calcium: 8.6 mg/dL — ABNORMAL LOW (ref 8.9–10.3)
Chloride: 106 mmol/L (ref 98–111)
Creatinine, Ser: 0.79 mg/dL (ref 0.61–1.24)
GFR, Estimated: >60 mL/min (ref >60)
Glucose, Bld: 98 mg/dL (ref 70–99)
Potassium: 3.9 mmol/L (ref 3.5–5.1)
Sodium: 137 mmol/L (ref 135–145)

## 2023-03-01 LAB — CBC
HCT: 23.2 % — ABNORMAL LOW (ref 39.0–52.0)
Hemoglobin: 8 g/dL — ABNORMAL LOW (ref 13.0–17.0)
MCH: 25.8 pg — ABNORMAL LOW (ref 26.0–34.0)
MCHC: 34.5 g/dL (ref 30.0–36.0)
MCV: 74.8 fL — ABNORMAL LOW (ref 80.0–100.0)
Platelets: 958 10*3/uL (ref 150–400)
RBC: 3.1 MIL/uL — ABNORMAL LOW (ref 4.22–5.81)
RDW: 17.4 % — ABNORMAL HIGH (ref 11.5–15.5)
WBC: 15.1 10*3/uL — ABNORMAL HIGH (ref 4.0–10.5)
nRBC: 3.4 % — ABNORMAL HIGH (ref 0.0–0.2)

## 2023-03-01 LAB — TECHNOLOGIST SMEAR REVIEW: Plt Morphology: INCREASED

## 2023-03-01 LAB — GLUCOSE, CAPILLARY
Glucose-Capillary: 100 mg/dL — ABNORMAL HIGH (ref 70–99)
Glucose-Capillary: 107 mg/dL — ABNORMAL HIGH (ref 70–99)
Glucose-Capillary: 99 mg/dL (ref 70–99)

## 2023-03-01 LAB — PHOSPHORUS: Phosphorus: 3.5 mg/dL (ref 2.5–4.6)

## 2023-03-01 LAB — MAGNESIUM: Magnesium: 2.2 mg/dL (ref 1.7–2.4)

## 2023-03-01 LAB — FERRITIN: Ferritin: 1517 ng/mL — ABNORMAL HIGH (ref 24–336)

## 2023-03-01 MED ORDER — ORAL CARE MOUTH RINSE: 15.0000 mL | OROMUCOSAL | Status: DC | PRN

## 2023-03-01 MED ORDER — BUSPIRONE HCL 10 MG PO TABS
10.0000 mg | ORAL_TABLET | Freq: Three times a day (TID) | ORAL | Status: DC
  Administered 2023-03-01 – 2023-03-03 (×7): 10 mg via ORAL
  Filled 2023-03-01 (×7): qty 1

## 2023-03-01 NOTE — Progress Notes (Signed)
PROGRESS NOTE    Peter Kline  NGE:[id number] DOB: Jun 13, 1965 DOA: 02/11/2023 PCP: Peter Angst, MD  Chief Complaint  Patient presents with   Altered Mental Status    Brief Narrative:   Peter Kline is Peter Kline 57 y.o. male with Peter Kline history of sickle cell c disease with priapism, GERD, chronic pain, retinal hemorrhage, tobacco use, marijuana use, vitamin d deficiency. Patient presented secondary to altered mental status and required ICU admission for airway protection. Workup significant for acute strokes, acute DVT, aspiration pneumonia with hospitalization complicated by sepsis. Workup negative for CNS infection. Patient improved enough for extubation and transfer to the floor, with persistent fevers.   Assessment & Plan:   Principal Problem:   AMS (altered mental status) Active Problems:   Sepsis (HCC)   Encephalopathy acute   Acute respiratory failure with hypoxia (HCC)   Community acquired pneumonia   Protein-calorie malnutrition, severe   Cerebrovascular accident (CVA) due to thrombosis of precerebral artery (HCC)   Paradoxical embolism (HCC)   HAP (hospital-acquired pneumonia)   Aspiration pneumonia of both lungs (HCC)  Thrombocytosis  Reactive, unclear etiology He's on anticoagulation as noted below ferritin Will continue to monitor - low threshold for hematology c/s  Eosinophilia  Noted, will continue to monitor   Acute metabolic encephalopathy Acute toxic encephalopathy Multifactorial in setting of polysubstance abuse and strokes, in addition to likely aspiration pneumonia. Infectious workup negative for CNS infection. Patient continues to get closer to baseline.   Acute embolic CVA MRI on 10/31 confirmed Peter Kline subcentimeter acute infarct in the left occipital white matter in addition to MRI on 11/3 significant for scattered small new acute infarcts in the bilateral cerebral and cerebellar hemispheres. CTA head and neck negative for large vessel occlusion or  other emergent finding. LDL of 49. Hemoglobin A1C of 4.8%. Transthoracic Echocardiogram significant for normal LVEF of 55-60% with grade 2 diastolic dysfunction and no atrial level shunt. Transesophageal Echocardiogram significant for positive bubble study suggesting PFO. Neurology recommendations for Heparin drip with transition to Eliquis when able. PT/OT recommendations for likely SNF pending cognitive improvement. Heparin discontinued -neurology c/s, appreciate recs (signed off 11/8) - bilateral cerebral and cerebellar hemisphere strokes from paradoxical embolism due to R to L cardiac shunt and presence of DVT with concurrent small vessel disease.  Neurology recommending eliquis. Statin not indicated as LDL within goal.    Aspiration pneumonia Recurrent Fevers Sepsis Staph Epidermidis Pneumonia Acute respiratory failure with hypoxia Related to acute encephalopathy and aspiration pneumonia  CT chest with patchy airspace consolidation in bilateral lower lobes, L>R and minimally in RUL concerning for multifocal pneumonia - secretions in distal trachea and L mainstem bronchus Appreciate ID assistance -> transitioned to unasyn.  Plan for 5-7 days, transition to augmentin once ready for d/c.  Recommending blood cultures if recurrent fevers.     Acute bilateral lower extremity DVT Acute DVT involving the left posterior tibial veins and left peroneal veins. Age indeterminate DVT involving the right posterior tibial veins and right peroneal veins. Patient managed on heparin and transitioned to Eliquis. -Continue Eliquis PO   Dysuria Urine culture with no growth. Symptoms are improved.   Hypernatremia improved   Sickle cell C disease Noted. Patient is on folic acid as an outpatient. -Continue folic acid   GERD Noted.   Dysphagia Improved, SLP recommending regular/thin liquids on 11/12 NG tube discontinued 11/13, tolerating diet well     DVT prophylaxis: eliquis Code Status: full Family  Communication: none Disposition:   Status  is: Inpatient Remains inpatient appropriate because: need for continued inpatient care   Consultants:  ID  Procedures:  LE [place] Summary:  BILATERAL:  -No evidence of popliteal cyst, bilaterally.  RIGHT:   - Findings consistent with age indeterminate deep vein thrombosis  involving the right posterior tibial veins, and right peroneal veins.        LEFT:  - Findings consistent with acute deep vein thrombosis involving the left  posterior tibial veins, and left peroneal veins.   TEE IMPRESSIONS     1. Left ventricular ejection fraction, by estimation, is 55 to 60%. The  left ventricle has normal function. The left ventricle has no regional  wall motion abnormalities.   2. Right ventricular systolic function is normal. The right ventricular  size is normal. Tricuspid regurgitation signal is inadequate for assessing  PA pressure.   3. Left atrial size was mildly dilated. No left atrial/left atrial  appendage thrombus was detected.   4. The mitral valve is normal in structure. Trivial mitral valve  regurgitation. No evidence of mitral stenosis.   5. The aortic valve is tricuspid. Aortic valve regurgitation is not  visualized. No aortic stenosis is present.   6. Bubble study positive suggesting PFO.   Echo IMPRESSIONS     1. Left ventricular ejection fraction, by estimation, is 55 to 60%. The  left ventricle has normal function. The left ventricle has no regional  wall motion abnormalities. Left ventricular diastolic parameters are  consistent with Grade II diastolic  dysfunction (pseudonormalization).   2. RV-RA gradient 33 mmHg suggesting at least mild to moderate increase  in RVSP depending on CVP. Right ventricular systolic function is normal.  The right ventricular size is normal.   3. Left atrial size was moderately dilated.   4. The mitral valve is abnormal, mildly calcified with somewhat  restricted posterior leaflet.  Mild to moderate mitral valve regurgitation.   5. The aortic valve is tricuspid. Aortic valve regurgitation is not  visualized.   6. The inferior vena cava is normal in size with <50% respiratory  variability, suggesting right atrial pressure of 8 mmHg.   Comparison(s): No prior Echocardiogram.    Antimicrobials:  Anti-infectives (From admission, onward)    Start     Dose/Rate Route Frequency Ordered Stop   02/27/23 1330  Ampicillin-Sulbactam (UNASYN) 3 g in sodium chloride 0.9 % 100 mL IVPB        3 g 200 mL/hr over 30 Minutes Intravenous Every 6 hours 02/27/23 1237     02/26/23 2200  linezolid (ZYVOX) tablet 600 mg  Status:  Discontinued        600 mg Oral Every 12 hours 02/26/23 1016 02/27/23 1237   02/23/23 2100  vancomycin (VANCOREADY) IVPB 1250 mg/250 mL  Status:  Discontinued        1,250 mg 166.7 mL/hr over 90 Minutes Intravenous Every 12 hours 02/23/23 1144 02/26/23 1016   02/20/23 2100  vancomycin (VANCOREADY) IVPB 1500 mg/300 mL  Status:  Discontinued        1,500 mg 150 mL/hr over 120 Minutes Intravenous Every 12 hours 02/20/23 0929 02/23/23 1144   02/20/23 0830  vancomycin (VANCOREADY) IVPB 1500 mg/300 mL        1,500 mg 150 mL/hr over 120 Minutes Intravenous  Once 02/20/23 0744 02/20/23 1128   02/18/23 1945  piperacillin-tazobactam (ZOSYN) IVPB 3.375 g  Status:  Discontinued        3.375 g 12.5 mL/hr over 240 Minutes Intravenous Every 8 hours  02/18/23 1859 02/20/23 1452   02/17/23 0800  cefTRIAXone (ROCEPHIN) 2 g in sodium chloride 0.9 % 100 mL IVPB        2 g 200 mL/hr over 30 Minutes Intravenous Every 24 hours 02/17/23 0714 02/19/23 1936   02/14/23 1200  Ampicillin-Sulbactam (UNASYN) 3 g in sodium chloride 0.9 % 100 mL IVPB  Status:  Discontinued        3 g 200 mL/hr over 30 Minutes Intravenous Every 6 hours 02/14/23 1053 02/17/23 0740   02/14/23 1000  cefTRIAXone (ROCEPHIN) 2 g in sodium chloride 0.9 % 100 mL IVPB  Status:  Discontinued        2 g 200 mL/hr over  30 Minutes Intravenous Every 24 hours 02/13/23 1318 02/14/23 1052   02/13/23 1000  doxycycline (VIBRA-TABS) tablet 100 mg  Status:  Discontinued        100 mg Per Tube Every 12 hours 02/13/23 0853 02/14/23 1052   02/12/23 1000  cefTRIAXone (ROCEPHIN) 2 g in sodium chloride 0.9 % 100 mL IVPB  Status:  Discontinued        2 g 200 mL/hr over 30 Minutes Intravenous Every 12 hours 02/12/23 0638 02/13/23 1318   02/12/23 0800  ampicillin (OMNIPEN) 2 g in sodium chloride 0.9 % 100 mL IVPB  Status:  Discontinued        2 g 300 mL/hr over 20 Minutes Intravenous Every 4 hours 02/12/23 0638 02/13/23 0853   02/12/23 0800  vancomycin (VANCOCIN) IVPB 1000 mg/200 mL premix  Status:  Discontinued        1,000 mg 200 mL/hr over 60 Minutes Intravenous Every 8 hours 02/12/23 0703 02/13/23 0853   02/11/23 2300  acyclovir (ZOVIRAX) 825 mg in dextrose 5 % 250 mL IVPB  Status:  Discontinued        10 mg/kg  82.6 kg 266.5 mL/hr over 60 Minutes Intravenous Every 8 hours 02/11/23 2251 02/13/23 0853   02/11/23 2145  vancomycin (VANCOREADY) IVPB 1750 mg/350 mL        1,750 mg 175 mL/hr over 120 Minutes Intravenous  Once 02/11/23 2137 02/12/23 0231   02/11/23 2145  ampicillin (OMNIPEN) 2 g in sodium chloride 0.9 % 100 mL IVPB        2 g 300 mL/hr over 20 Minutes Intravenous  Once 02/11/23 2138 02/11/23 2316   02/11/23 2115  cefTRIAXone (ROCEPHIN) 2 g in sodium chloride 0.9 % 100 mL IVPB        2 g 200 mL/hr over 30 Minutes Intravenous  Once 02/11/23 2101 02/11/23 2223       Subjective: No new complaints Asking for regular diet   Objective: Vitals:   03/01/23 0330 03/01/23 0500 03/01/23 0842 03/01/23 1150  BP: 139/84  131/89 114/79  Pulse: 82  77 87  Resp: 20  18 18   Temp: 98.6 F (37 C)  98.9 F (37.2 C) 98.8 F (37.1 C)  TempSrc: Oral  Oral Oral  SpO2: 96%  97% 97%  Weight:  79 kg    Height:        Intake/Output Summary (Last 24 hours) at 03/01/2023 1351 Last data filed at 03/01/2023 1330 Gross  per 24 hour  Intake 1300 ml  Output 1150 ml  Net 150 ml   Filed Weights   02/27/23 0500 02/28/23 0500 03/01/23 0500  Weight: 79.1 kg 79 kg 79 kg    Examination:  General: No acute distress. Cardiovascular: RRR Lungs: unlabored Neurological: Alert and oriented 3. Moves all extremities  4 with equal strength. Cranial nerves II through XII grossly intact. Extremities: No clubbing or cyanosis. No edema.   Data Reviewed: I have personally reviewed following labs and imaging studies  CBC: Recent Labs  Lab 02/25/23 0439 02/26/23 0426 02/27/23 0859 02/28/23 1100 03/01/23 0422  WBC 22.8* 19.8* 18.0* 16.2* 14.5*  15.1*  NEUTROABS  --   --  13.5* 12.3* 9.8*  HGB 8.7* 9.4* 8.7* 8.4* 8.0*  8.0*  HCT 24.2* 26.7* 24.7* 23.9* 22.7*  23.2*  MCV 74.2* 74.4* 74.0* 74.9* 74.9*  74.8*  PLT 467* 459* 671* 671* 995*  958*    Basic Metabolic Panel: Recent Labs  Lab 02/23/23 0930 02/25/23 0439 02/27/23 0859 02/28/23 1100 03/01/23 0422  NA 144 138 136 138 137  K 3.8 3.9 4.3 4.6 3.9  CL 111 106 103 107 106  CO2 22 22 21* 20* 22  GLUCOSE 122* 129* 119* 104* 98  BUN 16 17 16 13 12   CREATININE 0.88 0.77 1.09 1.13 0.79  CALCIUM 8.5* 8.2* 8.5* 8.1* 8.6*  MG  --   --  2.3 2.3 2.2  PHOS  --   --  3.3 2.9 3.5    GFR: Estimated Creatinine Clearance: 110.5 mL/min (by C-G formula based on SCr of 0.79 mg/dL).  Liver Function Tests: Recent Labs  Lab 02/27/23 0859 02/28/23 1100  AST 36 37  ALT 61* 49*  ALKPHOS 195* 183*  BILITOT 0.8 0.9  PROT 7.8 7.4  ALBUMIN 2.1* 2.2*    CBG: Recent Labs  Lab 02/28/23 1713 02/28/23 2021 03/01/23 0005 03/01/23 0328 03/01/23 0815  GLUCAP 96 112* 99 107* 100*     Recent Results (from the past 240 hour(s))  Urine Culture     Status: None   Collection Time: 02/21/23  9:47 AM   Specimen: Urine, Clean Catch  Result Value Ref Range Status   Specimen Description URINE, CLEAN CATCH  Final   Special Requests NONE  Final   Culture   Final     NO GROWTH Performed at [facility], [address], [place], [place] [address]    Report Status 02/22/2023 FINAL  Final  Culture, blood (Routine X 2) w Reflex to ID Panel     Status: None   Collection Time: 02/21/23  1:21 PM   Specimen: BLOOD RIGHT ARM  Result Value Ref Range Status   Specimen Description BLOOD RIGHT ARM  Final   Special Requests   Final    BOTTLES DRAWN AEROBIC AND ANAEROBIC Blood Culture results may not be optimal due to an inadequate volume of blood received in culture bottles   Culture   Final    NO GROWTH 5 DAYS Performed at [facility], [address] [place], [place] [address]    Report Status 02/26/2023 FINAL  Final  Culture, blood (Routine X 2) w Reflex to ID Panel     Status: None   Collection Time: 02/21/23  1:24 PM   Specimen: BLOOD RIGHT WRIST  Result Value Ref Range Status   Specimen Description BLOOD RIGHT WRIST  Final   Special Requests   Final    BOTTLES DRAWN AEROBIC AND ANAEROBIC Blood Culture results may not be optimal due to an inadequate volume of blood received in culture bottles   Culture   Final    NO GROWTH 5 DAYS Performed at [facility], [address] [place], [place] [address]    Report Status 02/26/2023 FINAL  Final  Radiology Studies: No results found.      Scheduled Meds:  apixaban  5 mg Oral BID   busPIRone  10 mg Oral TID   feeding supplement  237 mL Oral BID BM   folic acid  1 mg Oral Daily   multivitamin with minerals  1 tablet Oral Daily   nicotine  14 mg Transdermal Daily   mouth rinse  15 mL Mouth Rinse 4 times per day   sodium chloride flush  10 mL Intravenous Q12H   thiamine  100 mg Oral Daily   Continuous Infusions:  ampicillin-sulbactam (UNASYN) IV 3 g (03/01/23 1330)     LOS: 17 days    Time spent: over 30 min     Lacretia Nicks, MD [facility]   To contact the attending provider between 7A-7P or the covering provider during after hours 7P-7A,  please log into the web site [email or web address] and access using universal [place] password for that web site. If you do not have the password, please call the hospital operator.  03/01/2023, 1:51 PM

## 2023-03-01 NOTE — Plan of Care (Signed)
  Problem: Education: Goal: Ability to describe self-care measures that may prevent or decrease complications (Diabetes Survival Skills Education) will improve Outcome: Progressing Goal: Individualized Educational Video(s) Outcome: Progressing   Problem: Coping: Goal: Ability to adjust to condition or change in health will improve Outcome: Progressing   Problem: Fluid Volume: Goal: Ability to maintain a balanced intake and output will improve Outcome: Progressing   Problem: Health Behavior/Discharge Planning: Goal: Ability to identify and utilize available resources and services will improve Outcome: Progressing Goal: Ability to manage health-related needs will improve Outcome: Progressing   Problem: Metabolic: Goal: Ability to maintain appropriate glucose levels will improve Outcome: Progressing   Problem: Nutritional: Goal: Maintenance of adequate nutrition will improve Outcome: Progressing Goal: Progress toward achieving an optimal weight will improve Outcome: Progressing   Problem: Skin Integrity: Goal: Risk for impaired skin integrity will decrease Outcome: Progressing   Problem: Tissue Perfusion: Goal: Adequacy of tissue perfusion will improve Outcome: Progressing   Problem: Safety: Goal: Non-violent Restraint(s) Outcome: Progressing   Problem: Education: Goal: Knowledge of General Education information will improve Description: Including pain rating scale, medication(s)/side effects and non-pharmacologic comfort measures Outcome: Progressing   Problem: Health Behavior/Discharge Planning: Goal: Ability to manage health-related needs will improve Outcome: Progressing   Problem: Clinical Measurements: Goal: Ability to maintain clinical measurements within normal limits will improve Outcome: Progressing Goal: Will remain free from infection Outcome: Progressing Goal: Diagnostic test results will improve Outcome: Progressing Goal: Respiratory complications  will improve Outcome: Progressing Goal: Cardiovascular complication will be avoided Outcome: Progressing

## 2023-03-01 NOTE — Progress Notes (Signed)
Physical Therapy Treatment Patient Details Name: Peter Kline MRN: [id number] DOB: [birth year] Today's Date: 03/01/2023   History of Present Illness Pt is a 57 y/o male presenting to the ED on 10/30 with AMS, found wandering the streets of [facility] confused with slurred speech.  Initially negative for stroke.  Mental status failed to improve, pt became more agitated and eventually was intubated for airway protection.  Chest xray showed multifocal PNA.  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    PT Comments  Pt making significant improvements today with goals met and updated.  He was able to ambulate 300' without AD and supervision. Did not have any LOB.  Pt performed 5 steps with bil rails and CGA.  Pt scoring 18/24 on DGI (scores <19 indicate fall risk) however most of his lower score were from slowing to perform task.  Does still have some decreased insight to deficits ; however, deficits have also significantly improved.  Pt with decreased ability for higher cognitive task but pt reports will have assist at home.   Pt wants to return home at d/c.  PT agrees -no longer see the need for inpatient rehab.  Do recommend continued therapy and outpt therapy for safety and higher level balance.    If plan is discharge home, recommend the following: Assistance with cooking/housework;Direct supervision/assist for medications management;Direct supervision/assist for financial management;Assist for transportation;Help with stairs or ramp for entrance;A little help with walking and/or transfers;A little help with bathing/dressing/bathroom   Can travel by private vehicle        Equipment Recommendations  None recommended by PT     Recommendations for Other Services       Precautions / Restrictions Precautions Precautions: Fall     Mobility  Bed Mobility Overal bed mobility: Needs Assistance Bed Mobility: Supine to Sit, Sit to Supine     Supine to sit: Modified independent (Device/Increase time) Sit to supine: Modified independent (Device/Increase time)        Transfers Overall transfer level: Needs assistance Equipment used: None Transfers: Sit to/from Stand Sit to Stand: Supervision           General transfer comment: declined cane or RW, reports much better today: performed STS x 3 with supervision safely    Ambulation/Gait Ambulation/Gait assistance: Supervision Gait Distance (Feet): 300 Feet Assistive device: None Gait Pattern/deviations: Drifts right/left Gait velocity: normal     General Gait Details: Pt declined cane today and gait belt.  States doing much better.  He was able to ambulate in hallway at normal speed and without LOB.  He did drift R/L slightly twice with dynamic task but only minor drift and recovered without assist   Stairs Stairs: Yes Stairs assistance: Contact guard assist Stair Management: Two rails, Alternating pattern, Forwards Number of Stairs: 5 General stair comments: performed without difficulty; did demonstrate good safety by slowing down and using rails   Wheelchair Mobility     Tilt Bed    Modified Rankin (Stroke Patients Only) Modified Rankin (Stroke Patients Only) Pre-Morbid Rankin Score: No symptoms Modified Rankin: Slight disability     Balance Overall balance assessment: Needs assistance Sitting-balance support: No upper extremity supported Sitting balance-Leahy Scale: Normal     Standing balance  support: No upper extremity supported Standing balance-Leahy Scale: Good Standing balance comment: stood and ambulated without AD and no LOB               High Level Balance Comments: Pt able to turn in circle and pick object from  floor safely.  He could stand feet together EO and EC for 30 sec.  Not able to SLS Standardized Balance Assessment Standardized Balance Assessment : Dynamic Gait Index   Dynamic Gait Index Level Surface: Normal Change in Gait Speed: Normal Gait with Horizontal Head Turns: Mild Impairment Gait with Vertical Head Turns: Mild Impairment Gait and Pivot Turn: Mild Impairment Step Over Obstacle: Mild Impairment Step Around Obstacles: Mild Impairment Steps: Mild Impairment Total Score: 18      Cognition Arousal: Alert Behavior During Therapy: WFL for tasks assessed/performed Overall Cognitive Status: Impaired/Different from baseline                     Current Attention Level: Alternating   Following Commands: Follows multi-step commands consistently, Follows one step commands consistently   Awareness: Emergent   General Comments: Improving impulsivity and reports will have assist at home for safety with meds, iadls, adls.  Pt with improving safety awareness (had on socks without grips, asked for his shoes, sat to don shoes).  Does still have difficulty with higher level task (saying months in reverse order), but was oriented, followed 3 step commands, aware of events in football game on tv, carrying on appropriate conversations with therapist and student        Exercises      General Comments        Pertinent Vitals/Pain Pain Assessment Pain Assessment: No/denies pain    Home Living                          Prior Function            PT Goals (current goals can now be found in the care plan section) Acute Rehab PT Goals Patient Stated Goal: return home PT Goal Formulation: With patient Time For Goal Achievement: 03/15/23 Potential to Achieve Goals: Good Additional Goals Additional Goal #1: Pt will score >19 on DGI to indicate low fall risk Progress towards PT goals: Goals met and updated - see care plan    Frequency    Min 1X/week       PT Plan      Co-evaluation              AM-PAC PT "6 Clicks" Mobility   Outcome Measure  Help needed turning from your back to your side while in a flat bed without using bedrails?: None Help needed moving from lying on your back to sitting on the side of a flat bed without using bedrails?: None Help needed moving to and from a bed to a chair (including a wheelchair)?: A Little Help needed standing up from a chair using your arms (e.g., wheelchair or bedside chair)?: A Little Help needed to walk in hospital room?: A Little Help needed climbing 3-5 steps with a railing? : A Little 6 Click Score: 20    End of Session Equipment Utilized During Treatment:  (declined gait belt) Activity Tolerance: Patient tolerated treatment well Patient left: in bed;with call bell/phone within reach (alarm not on at arrival, confirmed with NT -no alarm needed) Nurse Communication: Mobility status PT Visit Diagnosis: Other abnormalities of gait and mobility (R26.89);Muscle weakness (generalized) (M62.81);Other  symptoms and signs involving the nervous system (R29.898)     Time: 9147-8295 PT Time Calculation (min) (ACUTE ONLY): 12 min  Charges:    $Gait Training: 8-22 mins PT General Charges $$ ACUTE PT VISIT: 1 Visit                     Anise Salvo, PT Acute Rehab Services [facility] [phone]    Rayetta Humphrey 03/01/2023, 4:32 PM

## 2023-03-01 NOTE — Plan of Care (Signed)
  Problem: Education: Goal: Ability to describe self-care measures that may prevent or decrease complications (Diabetes Survival Skills Education) will improve Outcome: Progressing Goal: Individualized Educational Video(s) Outcome: Progressing   Problem: Coping: Goal: Ability to adjust to condition or change in health will improve Outcome: Progressing   Problem: Fluid Volume: Goal: Ability to maintain a balanced intake and output will improve Outcome: Progressing   Problem: Health Behavior/Discharge Planning: Goal: Ability to identify and utilize available resources and services will improve Outcome: Progressing   Problem: Health Behavior/Discharge Planning: Goal: Ability to manage health-related needs will improve Outcome: Progressing   Problem: Ischemic Stroke/TIA Tissue Perfusion: Goal: Complications of ischemic stroke/TIA will be minimized Outcome: Progressing   Problem: Education: Goal: Knowledge of disease or condition will improve Outcome: Progressing Goal: Knowledge of secondary prevention will improve (MUST DOCUMENT ALL) Outcome: Progressing Goal: Knowledge of patient specific risk factors will improve [name] N/A or DELETE if not current risk factor) Outcome: Progressing

## 2023-03-02 DIAGNOSIS — D75838 Other thrombocytosis: Secondary | ICD-10-CM | POA: Diagnosis not present

## 2023-03-02 LAB — CBC WITH DIFFERENTIAL/PLATELET
Abs Immature Granulocytes: 0.17 10*3/uL — ABNORMAL HIGH (ref 0.00–0.07)
Basophils Absolute: 0 10*3/uL (ref 0.0–0.1)
Basophils Relative: 0 %
Eosinophils Absolute: 0.5 10*3/uL (ref 0.0–0.5)
Eosinophils Relative: 4 %
HCT: 22.9 % — ABNORMAL LOW (ref 39.0–52.0)
Hemoglobin: 8.2 g/dL — ABNORMAL LOW (ref 13.0–17.0)
Immature Granulocytes: 1 %
Lymphocytes Relative: 18 %
Lymphs Abs: 2.5 10*3/uL (ref 0.7–4.0)
MCH: 27 pg (ref 26.0–34.0)
MCHC: 35.8 g/dL (ref 30.0–36.0)
MCV: 75.3 fL — ABNORMAL LOW (ref 80.0–100.0)
Monocytes Absolute: 1.5 10*3/uL — ABNORMAL HIGH (ref 0.1–1.0)
Monocytes Relative: 11 %
Neutro Abs: 9.2 10*3/uL — ABNORMAL HIGH (ref 1.7–7.7)
Neutrophils Relative %: 66 %
Platelets: 1069 10*3/uL (ref 150–400)
RBC: 3.04 MIL/uL — ABNORMAL LOW (ref 4.22–5.81)
RDW: 16.8 % — ABNORMAL HIGH (ref 11.5–15.5)
Smear Review: INCREASED
WBC: 13.9 10*3/uL — ABNORMAL HIGH (ref 4.0–10.5)
nRBC: 3.4 % — ABNORMAL HIGH (ref 0.0–0.2)

## 2023-03-02 LAB — GLUCOSE, CAPILLARY
Glucose-Capillary: 121 mg/dL — ABNORMAL HIGH (ref 70–99)
Glucose-Capillary: 108 mg/dL — ABNORMAL HIGH (ref 70–99)
Glucose-Capillary: 110 mg/dL — ABNORMAL HIGH (ref 70–99)

## 2023-03-02 MED ORDER — AMOXICILLIN-POT CLAVULANATE 875-125 MG PO TABS
1.0000 | ORAL_TABLET | Freq: Two times a day (BID) | ORAL | 0 refills | Status: AC
Start: 2023-03-02 — End: 2023-03-06
  Filled 2023-03-02: qty 6, 3d supply, fill #0

## 2023-03-02 MED ORDER — APIXABAN 5 MG PO TABS
5.0000 mg | ORAL_TABLET | Freq: Two times a day (BID) | ORAL | 1 refills | Status: DC
  Filled 2023-03-02: qty 60, 30d supply, fill #0

## 2023-03-02 NOTE — Discharge Summary (Addendum)
Physician Discharge Summary  Peter Kline DGU:[id number] DOB: 02/01/1966 DOA: 02/11/2023  PCP: Peter Angst, MD  Admit date: 02/11/2023 Discharge date: 03/02/2023  Time spent: 40 minutes  Recommendations for Outpatient Follow-up:  Follow outpatient CBC/CMP  Follow with neurology outpatient for stroke Follow with hematology outpatient for thrombocytosis Follow eosinophilia outpatient Repeat chest imaging outpatient  1.6 cm LN in posterior L neck, follow outpatient   Stable for discharge, family needing Peter Kline day to get things ready at home.  Would ensure home health set up, meds at [facility].    Discharge Diagnoses:  Principal Problem:   AMS (altered mental status) Active Problems:   Sepsis (HCC)   Encephalopathy acute   Acute respiratory failure with hypoxia (HCC)   Community acquired pneumonia   Protein-calorie malnutrition, severe   Cerebrovascular accident (CVA) due to thrombosis of precerebral artery (HCC)   Paradoxical embolism (HCC)   HAP (hospital-acquired pneumonia)   Aspiration pneumonia of both lungs (HCC)   Reactive thrombocytosis   Discharge Condition: stable  Diet recommendation: heart heathy  Filed Weights   02/28/23 0500 03/01/23 0500 03/02/23 0500  Weight: 79 kg 79 kg 80.1 kg    History of present illness:   Peter Kline is Peter Kline 57 y.o. male with Peter Kline history of sickle cell c disease with priapism, GERD, chronic pain, retinal hemorrhage, tobacco use, marijuana use, vitamin d deficiency. Patient presented secondary to altered mental status and required ICU admission for airway protection. Workup significant for acute strokes, acute DVT, aspiration pneumonia with hospitalization complicated by sepsis. Workup negative for CNS infection. Patient improved enough for extubation and transfer to the floor, with persistent fevers.  He's been treated with antibiotics for pneumonia.  His fevers have gradually improved.  Initially plan was for CIR, but yesterday therapy  changed recommendation to outpatient.  Stable for discharge, patient's partner notes she needs time before he comes home.  Hopefully can d/c 11/18.   Hospital Course:  Assessment and Plan:  Acute metabolic encephalopathy Acute toxic encephalopathy Multifactorial in setting of polysubstance abuse and strokes, in addition to likely aspiration pneumonia. Infectious workup negative for CNS infection Resolved   Acute embolic CVA MRI on 10/31 confirmed Peter Kline subcentimeter acute infarct in the left occipital white matter in addition to MRI on 11/3 significant for scattered small new acute infarcts in the bilateral cerebral and cerebellar hemispheres. CTA head and neck negative for large vessel occlusion or other emergent finding. LDL of 49. Hemoglobin A1C of 4.8%. Transthoracic Echocardiogram significant for normal LVEF of 55-60% with grade 2 diastolic dysfunction and no atrial level shunt. Transesophageal Echocardiogram significant for positive bubble study suggesting PFO. Neurology recommendations for Heparin drip with transition to Eliquis when able. PT/OT recommendations for likely SNF pending cognitive improvement. Heparin discontinued -neurology c/s, appreciate recs (signed off 11/8) - bilateral cerebral and cerebellar hemisphere strokes from paradoxical embolism due to R to L cardiac shunt and presence of DVT with concurrent small vessel disease.  Neurology recommending eliquis. Statin not indicated as LDL within goal.  Follow with neurology outpatient    Aspiration pneumonia Recurrent Fevers Sepsis Staph Epidermidis Pneumonia Acute respiratory failure with hypoxia Afebrile since 11/13 Related to acute encephalopathy and aspiration pneumonia  CT chest with patchy airspace consolidation in bilateral lower lobes, L>R and minimally in RUL concerning for multifocal pneumonia - secretions in distal trachea and L mainstem bronchus Appreciate ID assistance (signed off 11/15) -> transitioned to unasyn.   Plan for 5-7 days, transition to augmentin once ready for  d/c.  Recommending blood cultures if recurrent fevers.     Acute bilateral lower extremity DVT Acute DVT involving the left posterior tibial veins and left peroneal veins. Age indeterminate DVT involving the right posterior tibial veins and right peroneal veins. Patient managed on heparin and transitioned to Eliquis. -Continue Eliquis PO   Thrombocytosis  Reactive, unclear etiology He's on anticoagulation as noted below ferritin Will continue to monitor - low threshold for hematology c/s   Eosinophilia  Noted, will continue to monitor  Follow outpatient  Dysuria Urine culture with no growth. Symptoms are improved.   Hypernatremia improved   Sickle cell C disease Noted. Patient is on folic acid as an outpatient. -Continue folic acid   GERD Noted.   Dysphagia Improved, SLP recommending regular/thin liquids on 11/12 NG tube discontinued 11/13, tolerating diet well       Procedures: LE [place] Summary:  BILATERAL:  -No evidence of popliteal cyst, bilaterally.  RIGHT:   - Findings consistent with age indeterminate deep vein thrombosis  involving the right posterior tibial veins, and right peroneal veins.   TEE IMPRESSIONS     1. Left ventricular ejection fraction, by estimation, is 55 to 60%. The  left ventricle has normal function. The left ventricle has no regional  wall motion abnormalities.   2. Right ventricular systolic function is normal. The right ventricular  size is normal. Tricuspid regurgitation signal is inadequate for assessing  PA pressure.   3. Left atrial size was mildly dilated. No left atrial/left atrial  appendage thrombus was detected.   4. The mitral valve is normal in structure. Trivial mitral valve  regurgitation. No evidence of mitral stenosis.   5. The aortic valve is tricuspid. Aortic valve regurgitation is not  visualized. No aortic stenosis is present.   6. Bubble study positive  suggesting PFO.   Echo IMPRESSIONS     1. Left ventricular ejection fraction, by estimation, is 55 to 60%. The  left ventricle has normal function. The left ventricle has no regional  wall motion abnormalities. Left ventricular diastolic parameters are  consistent with Grade II diastolic  dysfunction (pseudonormalization).   2. RV-RA gradient 33 mmHg suggesting at least mild to moderate increase  in RVSP depending on CVP. Right ventricular systolic function is normal.  The right ventricular size is normal.   3. Left atrial size was moderately dilated.   4. The mitral valve is abnormal, mildly calcified with somewhat  restricted posterior leaflet. Mild to moderate mitral valve regurgitation.   5. The aortic valve is tricuspid. Aortic valve regurgitation is not  visualized.   6. The inferior vena cava is normal in size with <50% respiratory  variability, suggesting right atrial pressure of 8 mmHg.   EEG IMPRESSION: This study is suggestive of moderate to severe diffuse encephalopathy. No seizures or epileptiform discharges were seen throughout the recording.  EEG IMPRESSION: This study is suggestive of moderate to severe diffuse encephalopathy. No seizures or epileptiform discharges were seen throughout the recording.  Intubated LP    Consultations: Neurology Hematology PCCM neurology  Discharge Exam: Vitals:   03/02/23 1210 03/02/23 1636  BP: 105/64 (!) 129/98  Pulse: 81 83  Resp: 18 17  Temp: 98.3 F (36.8 C) 98.2 F (36.8 C)  SpO2: 97% 99%   No complaints Wife at bedside They prefer to go home, not ready today  General: No acute distress. Cardiovascular: RRR. Lungs: unlabored Neurological: Alert. Moves all extremities 4 with equal strength. Cranial nerves II through XII grossly  intact. Extremities: No clubbing or cyanosis. No edema.   Discharge Instructions   Discharge Instructions     Ambulatory referral to Neurology   Complete by: As directed     An appointment is requested in approximately: 2 weeks   Call MD for:  difficulty breathing, headache or visual disturbances   Complete by: As directed    Call MD for:  extreme fatigue   Complete by: As directed    Call MD for:  hives   Complete by: As directed    Call MD for:  persistant dizziness or light-headedness   Complete by: As directed    Call MD for:  persistant nausea and vomiting   Complete by: As directed    Call MD for:  redness, tenderness, or signs of infection (pain, swelling, redness, odor or green/yellow discharge around incision site)   Complete by: As directed    Call MD for:  severe uncontrolled pain   Complete by: As directed    Call MD for:  temperature >100.4   Complete by: As directed    Diet - low sodium heart healthy   Complete by: As directed    Discharge instructions   Complete by: As directed    You were seen for altered mental status.  You were found to have strokes.  We've started you on anticoagulation (blood thinning medicine) with eliquis.    You'll need to follow up with neurology as an outpatient.  Your hospitalization was complicated by pneumonia.  You've had Mahek Schlesinger prolonged course of antibiotics and we'll send you home with another few days of antibiotics.  Your blood cells are abnormal (your platelets and eosinophils are elevated).  Follow this up with hematology outpatient.  You had an abnormal lymph node in your neck that should be followed outpatient.  Return for new, recurrent, or worsening symptoms.  Please ask your PCP to request records from this hospitalization so they know what was done and what the next steps will be.   Increase activity slowly   Complete by: As directed       Allergies as of 03/02/2023   No Known Allergies      Medication List     STOP taking these medications    gabapentin 300 MG capsule Commonly known as: Neurontin       TAKE these medications    amoxicillin-clavulanate 875-125 MG  tablet Commonly known as: AUGMENTIN Take 1 tablet by mouth 2 (two) times daily for 3 days.   apixaban 5 MG Tabs tablet Commonly known as: ELIQUIS Take 1 tablet (5 mg total) by mouth 2 (two) times daily. Start taking on: March 03, 2023   folic acid 1 MG tablet Commonly known as: FOLVITE Take 1 tablet (1 mg total) by mouth daily.       No Known Allergies  Follow-up Information     Peter Angst, MD Follow up.   Specialty: Internal Medicine Why: call for Darin Arndt follow up appointment Contact information: [address] [place] [address] [phone]         [facility] Follow up.   Why: call for Kanaan Kagawa follow up appointment Contact information: [address]     [address] [place] [address] [phone]        Rachel Moulds, MD Follow up.   Specialty: Hematology and Oncology Why: follow up your abnormal blood counts Contact information: [address] [place] [address] [phone]  The results of significant diagnostics from this hospitalization (including imaging, microbiology, ancillary and laboratory) are listed below for reference.    Significant Diagnostic Studies: DG Swallowing Func-Speech Pathology  Result Date: 02/24/2023 Table formatting from the original result was not included. Modified Barium Swallow Study Patient Details Name: Peter Kline MRN: [id number] Date of Birth: 1965-07-02 Today's Date: 02/24/2023 HPI/PMH: HPI: Musiq Vest is Therasa Lorenzi 57 year old male with presented to the emergency department on 10/30 with altered mental status. Was reportedly found wandering streets of [place] altered.  Pt with respiratory failure and intubated 10/30-11/6.  MRI 10/31 negative.  Repeat MRI 11/3: "Scattered small new acute infarcts in the bilateral cerebral and  cerebellar hemispheres, favored embolic in etiology."  Admitted with multifocal pna, but most recent chest imaging 11/6 shows  improvement. Pt with past medical history of sickle cell c/b priapism, GERD, chronic pain, retinal hemorrhage, tobacco use, marijuana use, vitamin D deficiency Clinical Impression: Clinical Impression: Pt presents with Tinslee Klare mild-moderate oral and Rahma Meller mild pharyngeal dysphagia per results of MBSS completed today. Of note, pt received multiple sedating medication prior to [facility] and he was much more lethargic compared to earlier bedside assessment today. MBSS trials were limited to thin liquids and pudding due to his lethargy.  Anticipate the majority of his swallowing deficits observed today are related to his current lethargic state.      Oral deficits characterized by poor oral acceptance and initiation (likely in the setting of lethargy), reduce labial resulting in moderate anterior spillage of thin liquids by spoon and cup. Pt unable to initiate use of straw. There was prolonged oral prep and transit of pudding trial with verbal cues needed to improve oral transit time. There was reduce oral coordination with liquids resulting in posterior spillage of thin liquids to the level of the pyriform sinuses before the swallow.      Pharyngeal deficits characterized by briefly delayed pharyngeal swallow initiation, reduced base of tongue retraction, reduced laryngeal vestibule closure, and reduced pharyngeal stripping.      Findings:   -No aspiration observed across PO trials on MBSS.   -There was x1 instance of trace, transient penetration of thin liquids by cup before/during the swallow. Penetrated liquid residue was ejected during the swallow.   -There was Audra Kagel min-mod amount of vallecular residue following pudding trials, which was reduced with Myrka Sylva subsequent liquid wash.      Recommend initiation of Donyae Kilner full liquids, thin liquid diet with strict adherence to aspiration precautions. Recommend only offering PO options if pt is fully awake/alert. Anticipate pt's diet will be able to be advanced at bedside per SLP with ongoing  assessment. Factors that may increase risk of adverse event in presence of aspiration Rubye Oaks & Clearance Coots 2021): Factors that may increase risk of adverse event in presence of aspiration Rubye Oaks & Clearance Coots 2021): Dependence for feeding and/or oral hygiene; Reduced cognitive function; Frail or deconditioned Recommendations/Plan: Swallowing Evaluation Recommendations Swallowing Evaluation Recommendations Recommendations: PO diet PO Diet Recommendation: Full liquid diet; Thin liquids (Level 0) Liquid Administration via: Cup; Straw Medication Administration: Crushed with puree Supervision: Full supervision/cueing for swallowing strategies; Full assist for feeding Swallowing strategies  : Small bites/sips; Slow rate Postural changes: Position pt fully upright for meals; Stay upright 30-60 min after meals Oral care recommendations: Oral care BID (2x/day) Treatment Plan Treatment Plan Treatment recommendations: Therapy as outlined in treatment plan below Follow-up recommendations: Skilled nursing-short term rehab (<3 hours/day) Functional status assessment: Patient has had Chene Kasinger recent decline in their functional status and  demonstrates the ability to make significant improvements in function in [place] reasonable and predictable amount of time. Treatment duration: 2 weeks Interventions: Aspiration precaution training; Patient/family education; Trials of upgraded texture/liquids; Diet toleration management by SLP Recommendations Recommendations for follow up therapy are one component of Rilley Poulter multi-disciplinary discharge planning process, led by the attending physician.  Recommendations may be updated based on patient status, additional functional criteria and insurance authorization. Assessment: Orofacial Exam: Orofacial Exam Oral Cavity - Dentition: Missing dentition; Poor condition Orofacial Anatomy: WFL Oral Motor/Sensory Function: Unable to test Anatomy: Anatomy: WFL Boluses Administered: Boluses Administered Boluses Administered:  Thin liquids (Level 0); Puree  Oral Impairment Domain: Oral Impairment Domain Tongue control during bolus hold: Posterior escape of less than half of bolus Bolus preparation/mastication: -- (not tested) Bolus transport/lingual motion: Slow tongue motion Oral residue: Trace residue lining oral structures Location of oral residue : Tongue Initiation of pharyngeal swallow : Valleculae  Pharyngeal Impairment Domain: Pharyngeal Impairment Domain Soft palate elevation: No bolus between soft palate (SP)/pharyngeal wall (PW) Laryngeal elevation: Complete superior movement of thyroid cartilage with complete approximation of arytenoids to epiglottic petiole Anterior hyoid excursion: Complete anterior movement Epiglottic movement: Complete inversion Laryngeal vestibule closure: Incomplete, narrow column air/contrast in laryngeal vestibule Pharyngeal stripping wave : Present - diminished Pharyngeal contraction (Shalaina Guardiola/P view only): N/Amiera Herzberg Pharyngoesophageal segment opening: Complete distension and complete duration, no obstruction of flow Tongue base retraction: Narrow column of contrast or air between tongue base and PPW Pharyngeal residue: Collection of residue within or on pharyngeal structures Location of pharyngeal residue: Valleculae  Esophageal Impairment Domain: Esophageal Impairment Domain Esophageal clearance upright position: Esophageal retention (brief with clearance) Pill: Pill Consistency administered: -- (not tested) Penetration/Aspiration Scale Score: Penetration/Aspiration Scale Score 1.  Material does not enter airway: Thin liquids (Level 0); Puree 2.  Material enters airway, remains ABOVE vocal cords then ejected out: Mildly thick liquids (Level 2, nectar thick) Compensatory Strategies: Compensatory Strategies Compensatory strategies: Yes Liquid wash: Effective Effective Liquid Wash: Puree   General Information: Caregiver present: No  Diet Prior to this Study: NPO   Temperature : Febrile   Respiratory Status: WFL    Supplemental O2: Nasal cannula (2L)   History of Recent Intubation: Yes  Behavior/Cognition: Confused; Requires cueing; Lethargic/Drowsy Self-Feeding Abilities: Dependent for feeding No data recorded Volitional Cough: Unable to elicit Volitional Swallow: Unable to elicit Exam Limitations: Poor participation; Fatigue Goal Planning: Prognosis for improved oropharyngeal function: Fair Barriers to Reach Goals: Cognitive deficits; Motivation; Behavior; Medication No data recorded Patient/Family Stated Goal: drink Tinea Nobile soda Consulted and agree with results and recommendations: Patient; Nurse Pain: Pain Assessment Pain Assessment: No/denies pain Breathing: 0 Negative Vocalization: 0 Facial Expression: 0 Body Language: 0 Consolability: 0 PAINAD Score: 0 End of Session: Start Time:SLP Start Time (ACUTE ONLY): 1142 Stop Time: SLP Stop Time (ACUTE ONLY): 1155 Time Calculation:SLP Time Calculation (min) (ACUTE ONLY): 13 min Charges: No data recorded SLP visit diagnosis: SLP Visit Diagnosis: Dysphagia, oropharyngeal phase (R13.12) Past Medical History: Past Medical History: Diagnosis Date  Shortness of breath   Sickle cell anemia (HCC)  Past Surgical History: Past Surgical History: Procedure Laterality Date  LAPAROSCOPIC GASTROTOMY W/ REPAIR OF ULCER   Ellery Plunk 02/24/2023, 11:36 AM  CT CHEST ABDOMEN PELVIS W CONTRAST  Result Date: 02/21/2023 CLINICAL DATA:  Sepsis EXAM: CT CHEST, ABDOMEN, AND PELVIS WITH CONTRAST TECHNIQUE: Multidetector CT imaging of the chest, abdomen and pelvis was performed following the standard protocol during bolus administration of intravenous contrast. RADIATION DOSE REDUCTION: This exam  was performed according to the departmental dose-optimization program which includes automated exposure control, adjustment of the mA and/or kV according to patient size and/or use of iterative reconstruction technique. CONTRAST:  80mL OMNIPAQUE IOHEXOL 350 MG/ML SOLN COMPARISON:  CT chest abdomen and pelvis  12/25/2020 FINDINGS: CT CHEST FINDINGS Cardiovascular: No significant vascular findings. Normal heart size. No pericardial effusion. Mediastinum/Nodes: Enteric tube is seen throughout the esophagus. Visualized thyroid gland is within normal limits. There are diffusely prominent mediastinal and hilar lymph nodes. Lungs/Pleura: There is Ilianna Bown trace left pleural effusion. There is patchy airspace consolidation within the bilateral lower lobes, left greater than right and minimally within the right upper lobe. There is no pneumothorax. There secretions in the distal trachea and left mainstem bronchus. Musculoskeletal: No chest wall mass or suspicious bone lesions identified. CT ABDOMEN PELVIS FINDINGS Hepatobiliary: No focal liver abnormality is seen. Small gallstones are again seen. No gallbladder wall thickening, or biliary dilatation. Pancreas: Unremarkable. No pancreatic ductal dilatation or surrounding inflammatory changes. Spleen: The spleen is small in size and diffusely calcified, unchanged. Adrenals/Urinary Tract: There are scattered rounded hypodensities in the left kidney which are too small to characterize, likely cysts. There is no hydronephrosis or perinephric stranding. The adrenal glands and bladder are within normal limits. Stomach/Bowel: Appendix appears normal. No evidence of bowel wall thickening, distention, or inflammatory changes. There are scattered air-fluid levels throughout the colon. Rectal tube in place. Enteric tube tip is in the gastric antrum. Vascular/Lymphatic: Aortic atherosclerosis. No enlarged abdominal or pelvic lymph nodes. Reproductive: Prostate is unremarkable. Other: No abdominal wall hernia or abnormality. No abdominopelvic ascites. Musculoskeletal: Punctate radiopaque densities are seen in the soft tissues in the right gluteal region and minimally in the left gluteal region, similar to prior study. No acute fracture. Mild chronic compression deformity of T12 is unchanged.  IMPRESSION: 1. Patchy airspace consolidation in the bilateral lower lobes, left greater than right and minimally in the right upper lobe worrisome for multifocal pneumonia. 2. Trace left pleural effusion. 3. Secretions in the distal trachea and left mainstem bronchus. 4. Diffusely prominent mediastinal and hilar lymph nodes, likely reactive. 5. Cholelithiasis. 6. Scattered air-fluid levels throughout the colon compatible with diarrheal illness. 7. Stable punctate radiopaque densities in the soft tissues of the right gluteal region and minimally in the left gluteal region, similar to prior study. 8. Subcentimeter left Bosniak II renal cyst, too small to characterize. No follow-up imaging is recommended. JACR 2018 Feb; 264-273, Management of the Incidental Renal Mass on CT, [employer] 2021; [phone], Bosniak Classification of Cystic Renal Masses, Version 2019. Aortic Atherosclerosis (ICD10-I70.0). Electronically Signed   By: Peter Cheney M.D.   On: 02/21/2023 18:05   VAS [place] LOWER EXTREMITY VENOUS (DVT)  Result Date: 02/19/2023  Lower Venous DVT Study Patient Name:  Peter Kline  Date of Exam:   02/18/2023 Medical Rec #: [id number]        Accession #:    [id number] Date of Birth: 1965-05-17        Patient Gender: M Patient Age:   11 years Exam Location:  [facility] Procedure:      VAS [place] LOWER EXTREMITY VENOUS (DVT) Referring Phys: Levon Hedger --------------------------------------------------------------------------------  Indications: Stroke.  Comparison Study: No prior study. Performing Technologist: Fernande Bras  Examination Guidelines: Dailey Alberson complete evaluation includes B-mode imaging, spectral Doppler, color Doppler, and power Doppler as needed of all accessible portions of each vessel. Bilateral testing is considered an integral part of Ameyah Bangura complete examination. Limited examinations for  reoccurring indications may be performed as noted. The reflux portion of the exam is performed with the  patient in reverse Trendelenburg.  +---------+---------------+---------+-----------+----------+-----------------+ RIGHT    CompressibilityPhasicitySpontaneityPropertiesThrombus Aging    +---------+---------------+---------+-----------+----------+-----------------+ CFV      Full           Yes      Yes                                    +---------+---------------+---------+-----------+----------+-----------------+ SFJ      Full                                                           +---------+---------------+---------+-----------+----------+-----------------+ FV Prox  Full                                                           +---------+---------------+---------+-----------+----------+-----------------+ FV Mid   Full                                                           +---------+---------------+---------+-----------+----------+-----------------+ FV DistalFull                                                           +---------+---------------+---------+-----------+----------+-----------------+ PFV      Full                                                           +---------+---------------+---------+-----------+----------+-----------------+ POP      Full           Yes      Yes                                    +---------+---------------+---------+-----------+----------+-----------------+ PTV      None           No       No                   Age Indeterminate +---------+---------------+---------+-----------+----------+-----------------+ PERO     None           No       No                   Age Indeterminate +---------+---------------+---------+-----------+----------+-----------------+   +---------+---------------+---------+-----------+----------+--------------+ LEFT     CompressibilityPhasicitySpontaneityPropertiesThrombus Aging +---------+---------------+---------+-----------+----------+--------------+ CFV      Full            Yes  Yes                                 +---------+---------------+---------+-----------+----------+--------------+ SFJ      Full                                                        +---------+---------------+---------+-----------+----------+--------------+ FV Prox  Full                                                        +---------+---------------+---------+-----------+----------+--------------+ FV Mid   Full                                                        +---------+---------------+---------+-----------+----------+--------------+ FV DistalFull                                                        +---------+---------------+---------+-----------+----------+--------------+ PFV      Full                                                        +---------+---------------+---------+-----------+----------+--------------+ POP      Full           Yes      Yes                                 +---------+---------------+---------+-----------+----------+--------------+ PTV      Full                                                        +---------+---------------+---------+-----------+----------+--------------+ PERO     None           No       No                   Acute          +---------+---------------+---------+-----------+----------+--------------+     Summary: BILATERAL: -No evidence of popliteal cyst, bilaterally. RIGHT: - Findings consistent with age indeterminate deep vein thrombosis involving the right posterior tibial veins, and right peroneal veins.   LEFT: - Findings consistent with acute deep vein thrombosis involving the left posterior tibial veins, and left peroneal veins.   *See table(s) above for measurements and observations. Electronically signed by Coral Else MD on 02/19/2023 at 9:26:31 PM.    Final    DG Abd Portable 1V  Result Date: 02/19/2023  CLINICAL DATA:  Feeding tube placement. EXAM: PORTABLE ABDOMEN - 1 VIEW  COMPARISON:  Same day. FINDINGS: Distal tip of feeding tube is seen in expected position of distal stomach. IMPRESSION: Distal tip of feeding tube is seen in expected position of distal stomach. Electronically Signed   By: Lupita Raider M.D.   On: 02/19/2023 12:24   DG CHEST PORT 1 VIEW  Result Date: 02/19/2023 CLINICAL DATA:  Pneumonia.  Intubated. EXAM: PORTABLE CHEST 1 VIEW COMPARISON:  Chest x-ray dated February 15, 2023. FINDINGS: Endotracheal tube tip 5 cm above the carina. Enteric tube within the stomach. The heart size and mediastinal contours are within normal limits. Basilar and peripheral predominant patchy airspace opacities in both lungs have improved over the past 4 days. No pleural effusion or pneumothorax. No acute osseous abnormality. IMPRESSION: 1. Improving multifocal pneumonia. Electronically Signed   By: Obie Dredge M.D.   On: 02/19/2023 10:32   DG Abd Portable 1V  Result Date: 02/19/2023 CLINICAL DATA:  Enteric tube placement. EXAM: PORTABLE ABDOMEN - 1 VIEW COMPARISON:  Abdominal x-ray from yesterday. FINDINGS: Enteric tube remains appropriately positioned in the stomach. Visualized bowel gas pattern is normal. Patchy airspace disease in both lung bases, not significantly changed. IMPRESSION: 1. Enteric tube appropriately positioned in the stomach. Electronically Signed   By: Obie Dredge M.D.   On: 02/19/2023 10:31   EEG adult  Result Date: 02/19/2023 Charlsie Quest, MD     02/19/2023  8:13 AM Patient Name: Peter Kline MRN: [id number] Epilepsy Attending: Charlsie Quest Referring Physician/Provider: Lorin Glass, MD Date: 02/18/2023 Duration: 24.35 mins Patient history:  57yo M with ams getting eeg to evaluate for seizure  Level of alertness:  comatose  AEDs during EEG study: Propofol, Phenobarb  Technical aspects: This EEG study was done with scalp electrodes positioned according to the 10-20 International system of electrode placement. Electrical activity was  reviewed with band pass filter of 1-70Hz , sensitivity of 7 uV/mm, display speed of 34mm/sec with Orville Widmann 60Hz  notched filter applied as appropriate. EEG data were recorded continuously and digitally stored.  Video monitoring was available and reviewed as appropriate.  Description: EEG showed continuous generalized predominantly 5 to 6 Hz theta slowing admixed with intermittent 2-3hz  delta slowing. Hyperventilation and photic stimulation were not performed.    ABNORMALITY - Continuous slow, generalized  IMPRESSION: This study is suggestive of moderate to severe diffuse encephalopathy. No seizures or epileptiform discharges were seen throughout the recording.  Charlsie Quest  DG Abd 1 View  Result Date: 02/18/2023 CLINICAL DATA:  Confirm OG tube placement. EXAM: ABDOMEN - 1 VIEW COMPARISON:  02/17/2023. FINDINGS: The bowel gas pattern is normal. An enteric tube terminates in the stomach and appears appropriate in position. Patchy airspace disease is present in the lungs bilaterally and increased from the prior exam. IMPRESSION: 1. OG tube terminates in the stomach and appears appropriate in position. 2. Nonobstructive bowel-gas pattern. 3. Patchy airspace disease at the lung bases, increased from the prior exam, possible edema or multifocal pneumonia. Electronically Signed   By: Thornell Sartorius M.D.   On: 02/18/2023 21:43   ECHO TEE  Result Date: 02/18/2023    TRANSESOPHOGEAL ECHO REPORT   Patient Name:   Peter Kline Date of Exam: 02/18/2023 Medical Rec #:  [id number]       Height:       72.0 in Accession #:    [id number]      Weight:  157.2 lb Date of Birth:  [birth year]       BSA:          1.923 m Patient Age:    57 years        BP:           108/64 mmHg Patient Gender: M               HR:           94 bpm. Exam Location:  Inpatient Procedure: Transesophageal Echo, Cardiac Doppler and Color Doppler Indications:     Endocarditis  History:         Patient has prior history of Echocardiogram examinations, most                   recent 02/16/2023. Stroke.  Sonographer:     Darlys Gales Referring Phys:  [id number] Perlie Gold Diagnosing Phys: Wilfred Lacy PROCEDURE: After discussion of the risks and benefits of Bonnell Placzek TEE, an informed consent was obtained from Sueellen Kayes family member. The transesophogeal probe was passed without difficulty through the esophogus of the patient. Sedation performed by different physician. The patient developed no complications during the procedure.  IMPRESSIONS  1. Left ventricular ejection fraction, by estimation, is 55 to 60%. The left ventricle has normal function. The left ventricle has no regional wall motion abnormalities.  2. Right ventricular systolic function is normal. The right ventricular size is normal. Tricuspid regurgitation signal is inadequate for assessing PA pressure.  3. Left atrial size was mildly dilated. No left atrial/left atrial appendage thrombus was detected.  4. The mitral valve is normal in structure. Trivial mitral valve regurgitation. No evidence of mitral stenosis.  5. The aortic valve is tricuspid. Aortic valve regurgitation is not visualized. No aortic stenosis is present.  6. Bubble study positive suggesting PFO. FINDINGS  Left Ventricle: Left ventricular ejection fraction, by estimation, is 55 to 60%. The left ventricle has normal function. The left ventricle has no regional wall motion abnormalities. The left ventricular internal cavity size was normal in size. There is  no left ventricular hypertrophy. Right Ventricle: The right ventricular size is normal. No increase in right ventricular wall thickness. Right ventricular systolic function is normal. Tricuspid regurgitation signal is inadequate for assessing PA pressure. Left Atrium: Left atrial size was mildly dilated. No left atrial/left atrial appendage thrombus was detected. Right Atrium: Right atrial size was normal in size. Pericardium: There is no evidence of pericardial effusion. Mitral Valve: The mitral valve  is normal in structure. Trivial mitral valve regurgitation. No evidence of mitral valve stenosis. Tricuspid Valve: The tricuspid valve is normal in structure. Tricuspid valve regurgitation is trivial. Aortic Valve: The aortic valve is tricuspid. Aortic valve regurgitation is not visualized. No aortic stenosis is present. Pulmonic Valve: The pulmonic valve was normal in structure. Pulmonic valve regurgitation is trivial. Aorta: The aortic root and ascending aorta are structurally normal, with no evidence of dilitation. IAS/Shunts: Bubble study positive suggesting PFO. [employer] Electronically signed by Wilfred Lacy Signature Date/Time: 02/18/2023/2:44:53 PM    Final    DG FL GUIDED LUMBAR PUNCTURE  Result Date: 02/17/2023 CLINICAL DATA:  Provided history: Meningitis. EXAM: DIAGNOSTIC LUMBAR PUNCTURE UNDER FLUOROSCOPIC GUIDANCE COMPARISON:  CT chest/abdomen/pelvis 12/25/2020 FLUOROSCOPY: Radiation Exposure Index (as provided by the fluoroscopic device): 7.90 mGy Kerma PROCEDURE: Due to the patient's altered mental status, informed consent was obtained the patient's uncle Elgie Congo) via telephone prior to the procedure. This process included Renise Gillies discussion of procedure risk. The  patient was positioned prone on the fluoroscopy table. An appropriate skin entry site was determined under fluoroscopy and marked. Belynda Pagaduan time-out was performed. The operator donned sterile gloves and Toneka Fullen mask. The lower back was prepped and draped in the usual sterile fashion. Local anesthesia was provided with 1% lidocaine. Under intermittent fluoroscopy, lumbar puncture was performed at the L4-L5 level using Romolo Sieling 20 gauge spinal needle with return of clear/colorless CSF. 12.5 mL of CSF were collected for laboratory studies. The inner stylet was replaced within the needle and the needle was removed in its entirety. Vyolet Sakuma dressing was applied at the skin entry site. No immediate post-procedure complication was apparent. IMPRESSION: 1.  Fluoroscopically-guided L4-L5 lumbar puncture. 2. 12.5 mL of CSF collected and sent for laboratory studies. 3. No immediate post-procedure complication. Electronically Signed   By: Peter Loge D.O.   On: 02/17/2023 15:08   DG Abd Portable 1V  Result Date: 02/17/2023 CLINICAL DATA:  Feeding tube placement. EXAM: PORTABLE ABDOMEN - 1 VIEW COMPARISON:  Radiograph 02/15/2023 FINDINGS: The previous enteric tube has been removed. There is Insiya Oshea new weighted enteric tube with tip in the right upper quadrant in the region of the distal stomach. Nonobstructive upper abdominal bowel gas pattern. IMPRESSION: Weighted enteric tube with tip in the region of the distal stomach. Electronically Signed   By: Narda Rutherford M.D.   On: 02/17/2023 15:04   CT ANGIO HEAD NECK W WO CM  Result Date: 02/16/2023 CLINICAL DATA:  Follow-up examination for stroke. EXAM: CT ANGIOGRAPHY HEAD AND NECK WITH AND WITHOUT CONTRAST TECHNIQUE: Multidetector CT imaging of the head and neck was performed using the standard protocol during bolus administration of intravenous contrast. Multiplanar CT image reconstructions and MIPs were obtained to evaluate the vascular anatomy. Carotid stenosis measurements (when applicable) are obtained utilizing NASCET criteria, using the distal internal carotid diameter as the denominator. RADIATION DOSE REDUCTION: This exam was performed according to the departmental dose-optimization program which includes automated exposure control, adjustment of the mA and/or kV according to patient size and/or use of iterative reconstruction technique. CONTRAST:  75mL OMNIPAQUE IOHEXOL 350 MG/ML SOLN COMPARISON:  Prior brain MRI from earlier the same day. FINDINGS: CT HEAD FINDINGS Brain: Cerebral volume within normal limits. Previously identified scattered small volume infarcts involving the bilateral cerebral and cerebellar hemispheres are not well visualized by CT. No other acute large vessel territory infarct. No acute  intracranial hemorrhage. No mass lesion or midline shift. No hydrocephalus or extra-axial fluid collection. Vascular: No abnormal hyperdense vessel. Skull: Scalp soft tissues demonstrate no acute finding. Calvarium intact. Sinuses/Orbits: Globes and orbital soft tissues within normal limits. Moderate mucosal thickening present throughout the sphenoid ethmoidal and left maxillary sinus. No significant mastoid effusion. Patient is intubated. Other: None. Review of the MIP images confirms the above findings CTA NECK FINDINGS Aortic arch: Visualized aortic arch within normal limits for caliber with standard branch pattern. No stenosis about the origin the great vessels. Right carotid system: Right common and internal carotid arteries are patent without dissection. Minimal plaque about the right carotid bulb without stenosis. Left carotid system: Left common and internal carotid arteries are patent without dissection. Minimal plaque about the left carotid bulb without stenosis. Vertebral arteries: Both vertebral arteries arise from subclavian arteries. No proximal subclavian artery stenosis. Vertebral arteries are patent without stenosis or dissection. Left vertebral artery slightly dominant. Skeleton: No discrete or worrisome osseous lesions. Other neck: Endotracheal and enteric tubes in place. Prominent left posterior chain lymph nodes noted, largest of which  measures 1.5 cm (series 13, image 109), indeterminate. No other acute abnormality. Upper chest: Scattered patchy opacities within the partially visualized left lung, suspicious for pneumonia. Mildly prominent mediastinal lymph nodes measure up to 1.2 cm. Review of the MIP images confirms the above findings CTA HEAD FINDINGS Anterior circulation: Mild atheromatous change about the carotid siphons without hemodynamically significant stenosis. A1 segments patent bilaterally. Normal anterior communicating artery complex. Anterior cerebral arteries patent without  stenosis. No M1 stenosis or occlusion. No proximal MCA branch occlusion or high-grade stenosis. Distal MCA branches perfused and symmetric. Posterior circulation: Both V4 segments patent without stenosis. Both PICA grossly patent at their origins. Basilar patent without stenosis. Superior cerebellar and posterior cerebral arteries patent bilaterally. Venous sinuses: Grossly patent allowing for timing the contrast bolus. Anatomic variants: None significant.  No aneurysm. Review of the MIP images confirms the above findings IMPRESSION: CT HEAD: 1. Previously identified scattered small volume infarcts involving the bilateral cerebral and cerebellar hemispheres not well visualized by CT. 2. No other acute intracranial abnormality. CTA HEAD AND NECK: 1. Negative CTA for large vessel occlusion or other emergent finding. 2. Mild for age atheromatous change about the carotid bifurcations and carotid siphons without hemodynamically significant stenosis. 3. Scattered patchy opacities within the partially visualized left lung, suspicious for pneumonia. 4. Mildly enlarged mediastinal and left posterior chain lymph nodes, indeterminate, but could be reactive. Electronically Signed   By: Rise Mu M.D.   On: 02/16/2023 22:53   ECHOCARDIOGRAM COMPLETE  Result Date: 02/16/2023    ECHOCARDIOGRAM REPORT   Patient Name:   Peter Kline Date of Exam: 02/16/2023 Medical Rec #:  [id number]       Height:       72.0 in Accession #:    [id number]      Weight:       165.6 lb Date of Birth:  1965-10-06       BSA:          1.966 m Patient Age:    57 years        BP:           116/74 mmHg Patient Gender: M               HR:           65 bpm. Exam Location:  Inpatient Procedure: 2D Echo, Color Doppler and Cardiac Doppler STAT ECHO Indications:    stroke  History:        Patient has no prior history of Echocardiogram examinations.                 Sepsis.; Signs/Symptoms:Altered Mental Status.  Sonographer:    Delcie Roch RDCS  Referring Phys: [id number] SRISHTI L BHAGAT  Sonographer Comments: Echo performed with patient supine and on artificial respirator. IMPRESSIONS  1. Left ventricular ejection fraction, by estimation, is 55 to 60%. The left ventricle has normal function. The left ventricle has no regional wall motion abnormalities. Left ventricular diastolic parameters are consistent with Grade II diastolic dysfunction (pseudonormalization).  2. RV-RA gradient 33 mmHg suggesting at least mild to moderate increase in RVSP depending on CVP. Right ventricular systolic function is normal. The right ventricular size is normal.  3. Left atrial size was moderately dilated.  4. The mitral valve is abnormal, mildly calcified with somewhat restricted posterior leaflet. Mild to moderate mitral valve regurgitation.  5. The aortic valve is tricuspid. Aortic valve regurgitation is not visualized.  6. The inferior vena cava is normal in  size with <50% respiratory variability, suggesting right atrial pressure of 8 mmHg. Comparison(s): No prior Echocardiogram. FINDINGS  Left Ventricle: Left ventricular ejection fraction, by estimation, is 55 to 60%. The left ventricle has normal function. The left ventricle has no regional wall motion abnormalities. The left ventricular internal cavity size was normal in size. There is  no left ventricular hypertrophy. Left ventricular diastolic parameters are consistent with Grade II diastolic dysfunction (pseudonormalization). Right Ventricle: RV-RA gradient 33 mmHg suggesting at least mild to moderate increase in RVSP depending on CVP. The right ventricular size is normal. No increase in right ventricular wall thickness. Right ventricular systolic function is normal. Left Atrium: Left atrial size was moderately dilated. Right Atrium: Right atrial size was normal in size. Pericardium: There is no evidence of pericardial effusion. Mitral Valve: The mitral valve is abnormal. There is mild calcification of the mitral  valve leaflet(s). Mild to moderate mitral valve regurgitation. Tricuspid Valve: The tricuspid valve is grossly normal. Tricuspid valve regurgitation is mild. Aortic Valve: The aortic valve is tricuspid. There is mild aortic valve annular calcification. Aortic valve regurgitation is not visualized. Pulmonic Valve: The pulmonic valve was grossly normal. Pulmonic valve regurgitation is trivial. Aorta: The aortic root and ascending aorta are structurally normal, with no evidence of dilitation. Venous: IVC assessment for right atrial pressure unable to be performed due to mechanical ventilation. The inferior vena cava is normal in size with less than 50% respiratory variability, suggesting right atrial pressure of 8 mmHg. IAS/Shunts: No atrial level shunt detected by color flow Doppler.  LEFT VENTRICLE PLAX 2D LVIDd:         5.60 cm   Diastology LVIDs:         3.40 cm   LV e' medial:    7.94 cm/s LV PW:         1.00 cm   LV E/e' medial:  9.5 LV IVS:        1.00 cm   LV e' lateral:   12.40 cm/s LVOT diam:     2.30 cm   LV E/e' lateral: 6.1 LV SV:         91 LV SV Index:   46 LVOT Area:     4.15 cm  RIGHT VENTRICLE             IVC RV Basal diam:  2.80 cm     IVC diam: 2.00 cm RV S prime:     13.70 cm/s TAPSE (M-mode): 2.7 cm LEFT ATRIUM             Index        RIGHT ATRIUM           Index LA diam:        4.30 cm 2.19 cm/m   RA Area:     17.20 cm LA Vol (A2C):   95.0 ml 48.32 ml/m  RA Volume:   49.40 ml  25.13 ml/m LA Vol (A4C):   83.7 ml 42.57 ml/m LA Biplane Vol: 91.9 ml 46.74 ml/m  AORTIC VALVE LVOT Vmax:   101.00 cm/s LVOT Vmean:  66.900 cm/s LVOT VTI:    0.220 m  AORTA Ao Root diam: 3.10 cm Ao Asc diam:  3.50 cm MITRAL VALVE                  TRICUSPID VALVE MV Area (PHT): 4.15 cm       TR Peak grad:   32.7 mmHg MV Decel Time: 183 msec  TR Vmax:        286.00 cm/s MR Peak grad:    109.4 mmHg MR Mean grad:    76.0 mmHg    SHUNTS MR Vmax:         523.00 cm/s  Systemic VTI:  0.22 m MR Vmean:        419.0 cm/s    Systemic Diam: 2.30 cm MR PISA:         1.01 cm MR PISA Eff ROA: 8 mm MR PISA Radius:  0.40 cm MV E velocity: 75.40 cm/s MV Ashlee Player velocity: 65.40 cm/s MV E/Sabrinna Yearwood ratio:  1.15 Nona Dell MD Electronically signed by Nona Dell MD Signature Date/Time: 02/16/2023/2:44:07 PM    Final    MR BRAIN W WO CONTRAST  Result Date: 02/16/2023 CLINICAL DATA:  Altered mental status EXAM: MRI HEAD WITHOUT AND WITH CONTRAST TECHNIQUE: Multiplanar, multiecho pulse sequences of the brain and surrounding structures were obtained without and with intravenous contrast. CONTRAST:  7mL GADAVIST GADOBUTROL 1 MMOL/ML IV SOLN COMPARISON:  Brain MRI 02/13/2023 FINDINGS: Brain: Again seen is small early subacute infarct in the left occipital periventricular white matter (2-23), unchanged. There are scattered new small acute infarcts in the bilateral cerebral and cerebellar hemispheres, the largest in the left corona radiata/centrum semiovale. There is no associated hemorrhage or mass effect. Findings are favored embolic in etiology. There is no acute intracranial hemorrhage or extra-axial fluid collection. Parenchymal volume is stable. The ventricles are stable in size. Adler [place] small focus of encephalomalacia in the left frontal lobe, small remote cerebellar infarcts, and small remote infarct in the left pons are unchanged. Parenchymal signal is otherwise essentially normal. The pituitary and suprasellar region are normal. There is no mass lesion or abnormal enhancement. There is no mass effect or midline shift. Vascular: Normal flow voids. Skull and upper cervical spine: Normal marrow signal. Sinuses/Orbits: There is moderate mucosal thickening throughout the paranasal sinuses. The globes and orbits are unremarkable. Other: Shalae Belmonte midline nasopharyngeal Tornwaldt cyst is noted. IMPRESSION: Scattered small new acute infarcts in the bilateral cerebral and cerebellar hemispheres, favored embolic in etiology. Electronically Signed   By: Lesia Hausen  M.D.   On: 02/16/2023 14:12   DG CHEST PORT 1 VIEW  Result Date: 02/15/2023 CLINICAL DATA:  Aspiration pneumonia.  OG tube placement EXAM: PORTABLE CHEST 1 VIEW COMPARISON:  02/14/2023 FINDINGS: Endotracheal tube tip is 1 cm above the carina. OG tube is in the stomach. Patchy bilateral airspace disease most pronounced in the lower lobes, worsening since prior study. No effusions or pneumothorax. No acute bony abnormality. IMPRESSION: Worsening patchy bilateral airspace disease, most pronounced in the lower lobes. Electronically Signed   By: Charlett Nose M.D.   On: 02/15/2023 19:26   DG Abd 1 View  Result Date: 02/15/2023 CLINICAL DATA:  OG tube placement EXAM: ABDOMEN - 1 VIEW COMPARISON:  02/12/2023 FINDINGS: OG tube tip in the fundus of the stomach. IMPRESSION: OG tube in the stomach. Electronically Signed   By: Charlett Nose M.D.   On: 02/15/2023 19:26   DG Chest Port 1 View  Result Date: 02/14/2023 CLINICAL DATA:  Respiratory failure. EXAM: PORTABLE CHEST 1 VIEW COMPARISON:  February 12, 2023. FINDINGS: Stable cardiomediastinal silhouette. Endotracheal and nasogastric tubes are in grossly good position. Stable bilateral patchy and interstitial opacities are noted. Bony thorax is unremarkable. IMPRESSION: Stable support apparatus. Stable bilateral lung opacities as noted above. Electronically Signed   By: Lupita Raider M.D.   On: 02/14/2023 10:58  Overnight EEG with video  Result Date: 02/14/2023 Charlsie Quest, MD     02/14/2023  9:50 AM Patient Name: Peter Kline MRN: [id number] Epilepsy Attending: Charlsie Quest Referring Physician/Provider: Lynnell Catalan, MD Duration: 02/13/2023 1008 to 02/14/2023 1008 Patient history: 57yo M with ams getting eeg to evaluate for seizure Level of alertness:  comatose AEDs during EEG study: Propofol, Phenobarb Technical aspects: This EEG study was done with scalp electrodes positioned according to the 10-20 International system of electrode placement.  Electrical activity was reviewed with band pass filter of 1-70Hz , sensitivity of 7 uV/mm, display speed of 36mm/sec with Jonel Sick 60Hz  notched filter applied as appropriate. EEG data were recorded continuously and digitally stored.  Video monitoring was available and reviewed as appropriate. Description: EEG showed continuous generalized predominantly 5 to 6 Hz theta slowing admixed with intermittent 2-3hz  delta slowing. Hyperventilation and photic stimulation were not performed.   ABNORMALITY - Continuous slow, generalized IMPRESSION: This study is suggestive of moderate to severe diffuse encephalopathy. No seizures or epileptiform discharges were seen throughout the recording. Charlsie Quest   MR BRAIN W WO CONTRAST  Result Date: 02/13/2023 CLINICAL DATA:  CNS infection suspected.  Meningitis suspected EXAM: MRI HEAD WITHOUT AND WITH CONTRAST TECHNIQUE: Multiplanar, multiecho pulse sequences of the brain and surrounding structures were obtained without and with intravenous contrast. CONTRAST:  8mL GADAVIST GADOBUTROL 1 MMOL/ML IV SOLN COMPARISON:  Head CT from 2 days ago FINDINGS: Brain: Subcentimeter infarct in the periventricular white matter around the occipital horn of the left lateral ventricle. Chronic lacunar infarct in the left paramedian pons. Tiny chronic right cerebellar infarcts. Encephalomalacia involving the small area of the anterior left frontal cortex from uncertain remote insult. Generalized brain atrophy. No hemorrhage, hydrocephalus, mass, or collection. No evidence of subarachnoid debris by FLAIR imaging. Vascular: Normal flow voids. Skull and upper cervical spine: Subcutaneous infiltration into the left anterior scalp, favor scarring. Sinuses/Orbits: Suspect remote blowout fracture of the medial wall left orbit. Nose and sinus opacification in the setting of intubation. IMPRESSION: 1. No evidence of intracranial infection. 2. Subcentimeter acute infarct in the left occipital white matter. 3.  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 Nonspecific small area of left frontal lobe encephalomalacia. 4. Brain atrophy. Electronically Signed   By: Tiburcio Pea M.D.   On: 02/13/2023 06:46   DG FL GUIDED LUMBAR PUNCTURE  Result Date: 02/12/2023 CLINICAL DATA:  57 year old male with AMS, leukocytosis and fever concerning for meningitis. Bedside LP was performed per unsuccessful. Request for fluoro guided lumbar puncture. EXAM: DIAGNOSTIC LUMBAR PUNCTURE UNDER FLUOROSCOPIC GUIDANCE COMPARISON:  None Available. FLUOROSCOPY: Radiation Exposure Index (as provided by the fluoroscopic device): 15.7 mGy Kerma PROCEDURE: The procedure was performed with an emergent consent due to patient's incapacity to make decisions, and inability to reach surrogate decision maker. With the patient prone, the lower back was prepped with Betadine. 1% Lidocaine was used for local anesthesia. Lumbar puncture was attempted at the L2-3, L3-4, and L5-S1 level using Thanh Mottern 20 gauge needle, CSF could not be obtained despite optimal needle positioning. IMPRESSION: Unsuccessful attempts at fluoroscopic-guided lumbar puncture. Procedure was performed by Lawernce Ion, PA-C under supervision of Roanna Banning, MD. Electronically Signed   By: Roanna Banning M.D.   On: 02/12/2023 15:18   DG Chest Port 1 View  Result Date: 02/12/2023 CLINICAL DATA:  Endotracheal tube placement EXAM: PORTABLE CHEST 1 VIEW COMPARISON:  12/12/2022 FINDINGS: Endotracheal tube tip in the low intrathoracic trachea 1.8 cm from the carina.  Enteric tube tip and side-port in the stomach. Stable cardiomediastinal silhouette. Patchy left-greater-than-right airspace and interstitial opacities increased from prior. No pleural effusion or pneumothorax. IMPRESSION: 1. Endotracheal tube tip in the low intrathoracic trachea 1.8 cm from the carina. 2. Increased patchy left-greater-than-right airspace and interstitial opacities suspicious for pneumonia. Electronically  Signed   By: Minerva Fester M.D.   On: 02/12/2023 04:07   DG Abdomen 1 View  Result Date: 02/12/2023 CLINICAL DATA:  OG tube placement EXAM: ABDOMEN - 1 VIEW COMPARISON:  None Available. FINDINGS: Enteric tube tip and side port in the stomach. The bowel gas pattern is nonobstructive. No radio-opaque calculi or other significant radiographic abnormality are seen. IMPRESSION: Enteric tube tip and side-port in the stomach. Electronically Signed   By: Minerva Fester M.D.   On: 02/12/2023 04:06   CT Soft Tissue Neck W Contrast  Result Date: 02/12/2023 CLINICAL DATA:  Nonpulsatile neck mass. EXAM: CT NECK WITH CONTRAST TECHNIQUE: Multidetector CT imaging of the neck was performed using the standard protocol following the bolus administration of intravenous contrast. RADIATION DOSE REDUCTION: This exam was performed according to the departmental dose-optimization program which includes automated exposure control, adjustment of the mA and/or kV according to patient size and/or use of iterative reconstruction technique. CONTRAST:  75mL OMNIPAQUE IOHEXOL 350 MG/ML SOLN COMPARISON:  None Available. FINDINGS: Pharynx and larynx: Limited assessment given presence of endotracheal tube. No visualized abnormality. Salivary glands: No inflammation, mass, or stone. Thyroid: Normal. Lymph nodes: There is Melis Trochez 1.6 cm lymph node at the posterior left neck (8:82) this is just posterior to the sternocleidomastoid. No other enlarged or abnormal density lymph nodes. Vascular: Negative. Limited intracranial: Negative. Visualized orbits: Negative. Mastoids and visualized paranasal sinuses: Ethmoid sinus mucosal thickening. Skeleton: No acute or aggressive process. Upper chest: Multifocal ground-glass opacity in the upper lobes, left worse than right. Other: None IMPRESSION: 1. Jalysa Swopes 1.6 cm lymph node at the posterior left neck, just posterior to the sternocleidomastoid. This is nonspecific, but may account for the palpable abnormality.  2. No abscess or drainable fluid collection. 3. Multifocal ground-glass opacity in the upper lobes, left worse than right, concerning for aspiration/infection. Electronically Signed   By: Deatra Robinson M.D.   On: 02/12/2023 03:30   DG Chest Portable 1 View  Result Date: 02/12/2023 CLINICAL DATA:  Altered EXAM: PORTABLE CHEST 1 VIEW COMPARISON:  12/24/2020 FINDINGS: Mild diffuse reticulo nodular opacity some of which is felt secondary to chronic lung disease. Suspect acute superimposed airspace disease in the infrahilar lungs. Normal cardiac size. No pneumothorax IMPRESSION: Mild diffuse reticulo nodular opacity some of which is felt secondary to chronic lung disease. Suspect acute superimposed airspace disease/possible pneumonia in the infrahilar lungs. Electronically Signed   By: Jasmine Pang M.D.   On: 02/12/2023 00:46   CT HEAD CODE STROKE WO CONTRAST  Result Date: 02/11/2023 CLINICAL DATA:  Code stroke. Acute neurologic deficit. No further clinical history available. EXAM: CT HEAD WITHOUT CONTRAST TECHNIQUE: Contiguous axial images were obtained from the base of the skull through the vertex without intravenous contrast. RADIATION DOSE REDUCTION: This exam was performed according to the departmental dose-optimization program which includes automated exposure control, adjustment of the mA and/or kV according to patient size and/or use of iterative reconstruction technique. COMPARISON:  None Available. FINDINGS: Motion degraded examination complicated by multiple streak artifacts from objects outside the field of view. Brain: There is no mass, hemorrhage or extra-axial collection. The size and configuration of the ventricles and extra-axial CSF spaces are  normal. The brain parenchyma is normal, without evidence of acute or chronic infarction. Vascular: No abnormal hyperdensity of the major intracranial arteries or dural venous sinuses. No intracranial atherosclerosis. Skull: The visualized skull base,  calvarium and extracranial soft tissues are normal. Sinuses/Orbits: No fluid levels or advanced mucosal thickening of the visualized paranasal sinuses. No mastoid or middle ear effusion. The orbits are normal. ASPECTS [facility] Stroke Program Early CT Score) - Ganglionic level infarction (caudate, lentiform nuclei, internal capsule, insula, M1-M3 cortex): 7 - Supraganglionic infarction (M4-M6 cortex): 3 Total score (0-10 with 10 being normal): 10 IMPRESSION: 1. Motion degraded examination complicated by multiple streak artifacts from objects outside the field of view. 2. No acute intracranial abnormality. 3. ASPECTS is 10. These results were communicated to Dr. Caryl Pina at 7:57 pm on 02/11/2023 by text page via the [facility] messaging system. Electronically Signed   By: Deatra Robinson M.D.   On: 02/11/2023 19:57    Microbiology: Recent Results (from the past 240 hour(s))  Urine Culture     Status: None   Collection Time: 02/21/23  9:47 AM   Specimen: Urine, Clean Catch  Result Value Ref Range Status   Specimen Description URINE, CLEAN CATCH  Final   Special Requests NONE  Final   Culture   Final    NO GROWTH Performed at [facility], [address], [place], [place] [address]    Report Status 02/22/2023 FINAL  Final  Culture, blood (Routine X 2) w Reflex to ID Panel     Status: None   Collection Time: 02/21/23  1:21 PM   Specimen: BLOOD RIGHT ARM  Result Value Ref Range Status   Specimen Description BLOOD RIGHT ARM  Final   Special Requests   Final    BOTTLES DRAWN AEROBIC AND ANAEROBIC Blood Culture results may not be optimal due to an inadequate volume of blood received in culture bottles   Culture   Final    NO GROWTH 5 DAYS Performed at [facility], [address] [place], [place] [address]    Report Status 02/26/2023 FINAL  Final  Culture, blood (Routine X 2) w Reflex to ID Panel     Status: None   Collection Time: 02/21/23  1:24 PM   Specimen: BLOOD RIGHT WRIST   Result Value Ref Range Status   Specimen Description BLOOD RIGHT WRIST  Final   Special Requests   Final    BOTTLES DRAWN AEROBIC AND ANAEROBIC Blood Culture results may not be optimal due to an inadequate volume of blood received in culture bottles   Culture   Final    NO GROWTH 5 DAYS Performed at [facility], [address], [place], [place] [address]    Report Status 02/26/2023 FINAL  Final     Labs: Basic Metabolic Panel: Recent Labs  Lab 02/25/23 0439 02/27/23 0859 02/28/23 1100 03/01/23 0422  NA 138 136 138 137  K 3.9 4.3 4.6 3.9  CL 106 103 107 106  CO2 22 21* 20* 22  GLUCOSE 129* 119* 104* 98  BUN 17 16 13 12   CREATININE 0.77 1.09 1.13 0.79  CALCIUM 8.2* 8.5* 8.1* 8.6*  MG  --  2.3 2.3 2.2  PHOS  --  3.3 2.9 3.5   Liver Function Tests: Recent Labs  Lab 02/27/23 0859 02/28/23 1100  AST 36 37  ALT 61* 49*  ALKPHOS 195* 183*  BILITOT 0.8 0.9  PROT 7.8 7.4  ALBUMIN 2.1* 2.2*  No results for input(s): "LIPASE", "AMYLASE" in the last 168 hours. No results for input(s): "AMMONIA" in the last 168 hours. CBC: Recent Labs  Lab 02/27/23 0859 02/28/23 1100 03/01/23 0422 03/01/23 1633 03/02/23 1248  WBC 18.0* 16.2* 14.5*  15.1* 15.8* 13.9*  NEUTROABS 13.5* 12.3* 9.8* 12.2* 9.2*  HGB 8.7* 8.4* 8.0*  8.0* 8.8* 8.2*  HCT 24.7* 23.9* 22.7*  23.2* 24.9* 22.9*  MCV 74.0* 74.9* 74.9*  74.8* 74.6* 75.3*  PLT 671* 671* 995*  958* 1,093* 1,069*   Cardiac Enzymes: No results for input(s): "CKTOTAL", "CKMB", "CKMBINDEX", "TROPONINI" in the last 168 hours. BNP: BNP (last 3 results) No results for input(s): "BNP" in the last 8760 hours.  ProBNP (last 3 results) No results for input(s): "PROBNP" in the last 8760 hours.  CBG: Recent Labs  Lab 03/01/23 0328 03/01/23 0815 03/02/23 0747 03/02/23 1301 03/02/23 1633  GLUCAP 107* 100* 121* 108* 110*       Signed:  Lacretia Nicks MD.  [facility] 03/02/2023, 6:21 PM

## 2023-03-02 NOTE — Plan of Care (Signed)
  Problem: Education: Goal: Ability to describe self-care measures that may prevent or decrease complications (Diabetes Survival Skills Education) will improve Outcome: Progressing   

## 2023-03-02 NOTE — Consult Note (Signed)
[facility]  [phone] [phone]   MEDICAL ONCOLOGY - INITIAL CONSULTATION    Referral MD  Reason for Referral: Thrombocytosis  Chief Complaint  Patient presents with   Altered Mental Status    HPI:   This is a very pleasant 57 year old male patient with past medical history significant for sickle cell C disease with priapism, retinal hemorrhage, chronic pain presented with altered mental status.  He was found to have acute stroke, DVT, aspiration pneumonia complicated by sepsis.  He is now noted to have severe thrombocytosis hence hematology was consulted.  He is on anticoagulation already with Eliquis given his acute embolic CVA as well as DVT.  His wife was present at the bedside.  Prior to the hospitalization, patient mentions that he is healthy, denies any fevers, drenching night sweats, unintentional loss of weight or appetite, previous history of DVT or PE.  He denies any erythromelalgia or intractable pruritus after taking a warm shower.  He feels well today.  Rest of the pertinent 10 point ROS reviewed and negative   Past Medical History:  Diagnosis Date   Shortness of breath    Sickle cell anemia (HCC)   :   Past Surgical History:  Procedure Laterality Date   LAPAROSCOPIC GASTROTOMY W/ REPAIR OF ULCER    :   Current Facility-Administered Medications  Medication Dose Route Frequency Provider Last Rate Last Admin   acetaminophen (TYLENOL) tablet 650 mg  650 mg Oral Q6H PRN Zigmund Daniel., MD   650 mg at 03/01/23 2101   Ampicillin-Sulbactam (UNASYN) 3 g in sodium chloride 0.9 % 100 mL IVPB  3 g Intravenous Q6H Manandhar, Rozell Searing, MD 200 mL/hr at 03/02/23 1222 3 g at 03/02/23 1222   apixaban (ELIQUIS) tablet 5 mg  5 mg Oral BID Narda Bonds, MD   5 mg at 03/02/23 0513   bisacodyl (DULCOLAX) suppository 10 mg  10 mg Rectal Daily PRN Selmer Dominion B, NP   10 mg at 02/15/23 1616   busPIRone (BUSPAR) tablet 10 mg  10 mg  Oral TID Zigmund Daniel., MD   10 mg at 03/02/23 0928   feeding supplement (ENSURE ENLIVE / ENSURE PLUS) liquid 237 mL  237 mL Oral BID BM Lorin Glass, MD   237 mL at 02/27/23 0947   folic acid (FOLVITE) tablet 1 mg  1 mg Oral Daily Narda Bonds, MD   1 mg at 03/02/23 0928   ipratropium-albuterol (DUONEB) 0.5-2.5 (3) MG/3ML nebulizer solution 3 mL  3 mL Nebulization Q6H PRN Briant Sites, DO   3 mL at 02/26/23 1122   LORazepam (ATIVAN) tablet 2 mg  2 mg Oral BID PRN Hammons, Gerhard Munch, RPH   2 mg at 02/28/23 4540   multivitamin with minerals tablet 1 tablet  1 tablet Oral Daily Narda Bonds, MD   1 tablet at 03/02/23 9811   nicotine (NICODERM CQ - dosed in mg/24 hours) patch 14 mg  14 mg Transdermal Daily Hetty Blend C, NP   14 mg at 03/02/23 0931   Oral care mouth rinse  15 mL Mouth Rinse 4 times per day Lorin Glass, MD   15 mL at 03/02/23 1212   Oral care mouth rinse  15 mL Mouth Rinse PRN Zigmund Daniel., MD       oxyCODONE (Oxy IR/ROXICODONE) immediate release tablet 5 mg  5 mg Oral Q4H PRN Narda Bonds, MD   5 mg  at 02/26/23 0920   sodium chloride flush (NS) 0.9 % injection 10 mL  10 mL Intravenous Q12H Hetty Blend C, NP   10 mL at 03/02/23 0931   thiamine (VITAMIN B1) tablet 100 mg  100 mg Oral Daily Narda Bonds, MD   100 mg at 03/02/23 1478     No Known Allergies:   Family History  Problem Relation Age of Onset   Diabetes Mother    Hypertension Mother   :   Social History   Socioeconomic History   Marital status: Single    Spouse name: Not on file   Number of children: Not on file   Years of education: Not on file   Highest education level: Not on file  Occupational History   Not on file  Tobacco Use   Smoking status: Every Day    Current packs/day: 1.00    Types: Cigarettes   Smokeless tobacco: Never  Vaping Use   Vaping status: Never Used  Substance and Sexual Activity   Alcohol use: Yes    Alcohol/week: 6.0 standard  drinks of alcohol    Types: 6 Cans of beer per week    Comment: most days   Drug use: Yes    Types: Marijuana    Comment: occ   Sexual activity: Yes  Other Topics Concern   Not on file  Social History Narrative   Not on file   Social Determinants of Health   Financial Resource Strain: Low Risk  (11/15/2021)   Received from [facility]   Overall Financial Resource Strain (CARDIA)    Difficulty of Paying Living Expenses: Not hard at all  Food Insecurity: No Food Insecurity (02/25/2023)   Hunger Vital Sign    Worried About Running Out of Food in the Last Year: Never true    Ran Out of Food in the Last Year: Never true  Transportation Needs: No Transportation Needs (02/25/2023)   PRAPARE - Administrator, Civil Service (Medical): No    Lack of Transportation (Non-Medical): No  Physical Activity: Not on file  Stress: Not on file  Social Connections: Not on file  Intimate Partner Violence: Not At Risk (02/25/2023)   Humiliation, Afraid, Rape, and Kick questionnaire    Fear of Current or Ex-Partner: No    Emotionally Abused: No    Physically Abused: No    Sexually Abused: No   Exam: Patient Vitals for the past 24 hrs:  BP Temp Temp src Pulse Resp SpO2 Weight  03/02/23 1210 105/64 98.3 F (36.8 C) Oral 81 18 97 % --  03/02/23 0750 114/83 98.7 F (37.1 C) Oral 73 18 100 % --  03/02/23 0725 (!) 128/92 97.9 F (36.6 C) Oral 71 18 98 % --  03/02/23 0500 -- -- -- -- -- -- 176 lb 9.4 oz (80.1 kg)  03/02/23 0428 121/85 97.8 F (36.6 C) Oral 74 16 100 % --  03/01/23 2347 97/71 (!) 97.3 F (36.3 C) Oral 82 16 96 % --  03/01/23 2056 117/74 98.2 F (36.8 C) Oral 86 18 96 % --  03/01/23 1552 (!) 122/91 99.1 F (37.3 C) Oral 91 20 97 % --    Physical Exam Constitutional:      Appearance: Normal appearance.  Cardiovascular:     Rate and Rhythm: Normal rate and regular rhythm.     Pulses: Normal pulses.     Heart sounds: Normal heart sounds.   Pulmonary:  Effort: Pulmonary effort is normal.     Breath sounds: Normal breath sounds.  Abdominal:     General: Abdomen is flat. There is no distension.     Palpations: Abdomen is soft. There is no mass.  Musculoskeletal:        General: No swelling.     Cervical back: Normal range of motion and neck supple. No rigidity.  Lymphadenopathy:     Cervical: No cervical adenopathy.  Skin:    General: Skin is warm and dry.  Neurological:     Mental Status: He is alert.  Psychiatric:        Mood and Affect: Mood normal.        Lab Results  Component Value Date   WBC 15.8 (H) 03/01/2023   HGB 8.8 (L) 03/01/2023   HCT 24.9 (L) 03/01/2023   PLT 1,093 (HH) 03/01/2023   GLUCOSE 98 03/01/2023   CHOL 111 02/17/2023   TRIG 102 02/22/2023   HDL 27 (L) 02/17/2023   LDLCALC 49 02/17/2023   ALT 49 (H) 02/28/2023   AST 37 02/28/2023   NA 137 03/01/2023   K 3.9 03/01/2023   CL 106 03/01/2023   CREATININE 0.79 03/01/2023   BUN 12 03/01/2023   CO2 22 03/01/2023    DG Swallowing Func-Speech Pathology  Result Date: 02/24/2023 Table formatting from the original result was not included. Modified Barium Swallow Study Patient Details Name: NOLLIE DIPIRRO MRN: [id number] Date of Birth: May 02, 1965 Today's Date: 02/24/2023 HPI/PMH: HPI: Angelis Tomich is a 57 year old male with presented to the emergency department on 10/30 with altered mental status. Was reportedly found wandering streets of [place] altered.  Pt with respiratory failure and intubated 10/30-11/6.  MRI 10/31 negative.  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 Pt with past medical history of sickle cell c/b priapism, GERD, chronic pain, retinal hemorrhage, tobacco use, marijuana use, vitamin D deficiency Clinical Impression: Clinical Impression: Pt  presents with a mild-moderate oral and a mild pharyngeal dysphagia per results of MBSS completed today. Of note, pt received multiple sedating medication prior to [facility] and he was much more lethargic compared to earlier bedside assessment today. MBSS trials were limited to thin liquids and pudding due to his lethargy.  Anticipate the majority of his swallowing deficits observed today are related to his current lethargic state.      Oral deficits characterized by poor oral acceptance and initiation (likely in the setting of lethargy), reduce labial resulting in moderate anterior spillage of thin liquids by spoon and cup. Pt unable to initiate use of straw. There was prolonged oral prep and transit of pudding trial with verbal cues needed to improve oral transit time. There was reduce oral coordination with liquids resulting in posterior spillage of thin liquids to the level of the pyriform sinuses before the swallow.      Pharyngeal deficits characterized by briefly delayed pharyngeal swallow initiation, reduced base of tongue retraction, reduced laryngeal vestibule closure, and reduced pharyngeal stripping.      Findings:   -No aspiration observed across PO trials on MBSS.   -There was x1 instance of trace, transient penetration of thin liquids by cup before/during the swallow. Penetrated liquid residue was ejected during the swallow.   -There was a min-mod amount of vallecular residue following pudding trials, which was reduced with a subsequent liquid wash.  Recommend initiation of a full liquids, thin liquid diet with strict adherence to aspiration precautions. Recommend only offering PO options if pt is fully awake/alert. Anticipate pt's diet will be able to be advanced at bedside per SLP with ongoing assessment. Factors that may increase risk of adverse event in presence of aspiration Rubye Oaks & Clearance Coots 2021): Factors that may increase risk of adverse event in presence of aspiration Rubye Oaks & Clearance Coots 2021):  Dependence for feeding and/or oral hygiene; Reduced cognitive function; Frail or deconditioned Recommendations/Plan: Swallowing Evaluation Recommendations Swallowing Evaluation Recommendations Recommendations: PO diet PO Diet Recommendation: Full liquid diet; Thin liquids (Level 0) Liquid Administration via: Cup; Straw Medication Administration: Crushed with puree Supervision: Full supervision/cueing for swallowing strategies; Full assist for feeding Swallowing strategies  : Small bites/sips; Slow rate Postural changes: Position pt fully upright for meals; Stay upright 30-60 min after meals Oral care recommendations: Oral care BID (2x/day) Treatment Plan Treatment Plan Treatment recommendations: Therapy as outlined in treatment plan below Follow-up recommendations: Skilled nursing-short term rehab (<3 hours/day) Functional status assessment: Patient has had a recent decline in their functional status and demonstrates the ability to make significant improvements in function in a reasonable and predictable amount of time. Treatment duration: 2 weeks Interventions: Aspiration precaution training; Patient/family education; Trials of upgraded texture/liquids; Diet toleration management by SLP Recommendations Recommendations for follow up therapy are one component of a multi-disciplinary discharge planning process, led by the attending physician.  Recommendations may be updated based on patient status, additional functional criteria and insurance authorization. Assessment: Orofacial Exam: Orofacial Exam Oral Cavity - Dentition: Missing dentition; Poor condition Orofacial Anatomy: WFL Oral Motor/Sensory Function: Unable to test Anatomy: Anatomy: WFL Boluses Administered: Boluses Administered Boluses Administered: Thin liquids (Level 0); Puree  Oral Impairment Domain: Oral Impairment Domain Tongue control during bolus hold: Posterior escape of less than half of bolus Bolus preparation/mastication: -- (not tested) Bolus  transport/lingual motion: Slow tongue motion Oral residue: Trace residue lining oral structures Location of oral residue : Tongue Initiation of pharyngeal swallow : Valleculae  Pharyngeal Impairment Domain: Pharyngeal Impairment Domain Soft palate elevation: No bolus between soft palate (SP)/pharyngeal wall (PW) Laryngeal elevation: Complete superior movement of thyroid cartilage with complete approximation of arytenoids to epiglottic petiole Anterior hyoid excursion: Complete anterior movement Epiglottic movement: Complete inversion Laryngeal vestibule closure: Incomplete, narrow column air/contrast in laryngeal vestibule Pharyngeal stripping wave : Present - diminished Pharyngeal contraction (A/P view only): N/A Pharyngoesophageal segment opening: Complete distension and complete duration, no obstruction of flow Tongue base retraction: Narrow column of contrast or air between tongue base and PPW Pharyngeal residue: Collection of residue within or on pharyngeal structures Location of pharyngeal residue: Valleculae  Esophageal Impairment Domain: Esophageal Impairment Domain Esophageal clearance upright position: Esophageal retention (brief with clearance) Pill: Pill Consistency administered: -- (not tested) Penetration/Aspiration Scale Score: Penetration/Aspiration Scale Score 1.  Material does not enter airway: Thin liquids (Level 0); Puree 2.  Material enters airway, remains ABOVE vocal cords then ejected out: Mildly thick liquids (Level 2, nectar thick) Compensatory Strategies: Compensatory Strategies Compensatory strategies: Yes Liquid wash: Effective Effective Liquid Wash: Puree   General Information: Caregiver present: No  Diet Prior to this Study: NPO   Temperature : Febrile   Respiratory Status: WFL   Supplemental O2: Nasal cannula (2L)   History of Recent Intubation: Yes  Behavior/Cognition: Confused; Requires cueing; Lethargic/Drowsy Self-Feeding Abilities: Dependent for feeding No data recorded Volitional  Cough: Unable to elicit Volitional Swallow: Unable to elicit Exam Limitations: Poor participation; Fatigue Goal Planning:  Prognosis for improved oropharyngeal function: Fair Barriers to Reach Goals: Cognitive deficits; Motivation; Behavior; Medication No data recorded Patient/Family Stated Goal: drink a soda Consulted and agree with results and recommendations: Patient; Nurse Pain: Pain Assessment Pain Assessment: No/denies pain Breathing: 0 Negative Vocalization: 0 Facial Expression: 0 Body Language: 0 Consolability: 0 PAINAD Score: 0 End of Session: Start Time:SLP Start Time (ACUTE ONLY): 1142 Stop Time: SLP Stop Time (ACUTE ONLY): 1155 Time Calculation:SLP Time Calculation (min) (ACUTE ONLY): 13 min Charges: No data recorded SLP visit diagnosis: SLP Visit Diagnosis: Dysphagia, oropharyngeal phase (R13.12) Past Medical History: Past Medical History: Diagnosis Date  Shortness of breath   Sickle cell anemia (HCC)  Past Surgical History: Past Surgical History: Procedure Laterality Date  LAPAROSCOPIC GASTROTOMY W/ REPAIR OF ULCER   Ellery Plunk 02/24/2023, 11:36 AM  CT CHEST ABDOMEN PELVIS W CONTRAST  Result Date: 02/21/2023 CLINICAL DATA:  Sepsis EXAM: CT CHEST, ABDOMEN, AND PELVIS WITH CONTRAST TECHNIQUE: Multidetector CT imaging of the chest, abdomen and pelvis was performed following the standard protocol during bolus administration of intravenous contrast. RADIATION DOSE REDUCTION: This exam was performed according to the departmental dose-optimization program which includes automated exposure control, adjustment of the mA and/or kV according to patient size and/or use of iterative reconstruction technique. CONTRAST:  80mL OMNIPAQUE IOHEXOL 350 MG/ML SOLN COMPARISON:  CT chest abdomen and pelvis 12/25/2020 FINDINGS: CT CHEST FINDINGS Cardiovascular: No significant vascular findings. Normal heart size. No pericardial effusion. Mediastinum/Nodes: Enteric tube is seen throughout the esophagus. Visualized  thyroid gland is within normal limits. There are diffusely prominent mediastinal and hilar lymph nodes. Lungs/Pleura: There is a trace left pleural effusion. There is patchy airspace consolidation within the bilateral lower lobes, left greater than right and minimally within the right upper lobe. There is no pneumothorax. There secretions in the distal trachea and left mainstem bronchus. Musculoskeletal: No chest wall mass or suspicious bone lesions identified. CT ABDOMEN PELVIS FINDINGS Hepatobiliary: No focal liver abnormality is seen. Small gallstones are again seen. No gallbladder wall thickening, or biliary dilatation. Pancreas: Unremarkable. No pancreatic ductal dilatation or surrounding inflammatory changes. Spleen: The spleen is small in size and diffusely calcified, unchanged. Adrenals/Urinary Tract: There are scattered rounded hypodensities in the left kidney which are too small to characterize, likely cysts. There is no hydronephrosis or perinephric stranding. The adrenal glands and bladder are within normal limits. Stomach/Bowel: Appendix appears normal. No evidence of bowel wall thickening, distention, or inflammatory changes. There are scattered air-fluid levels throughout the colon. Rectal tube in place. Enteric tube tip is in the gastric antrum. Vascular/Lymphatic: Aortic atherosclerosis. No enlarged abdominal or pelvic lymph nodes. Reproductive: Prostate is unremarkable. Other: No abdominal wall hernia or abnormality. No abdominopelvic ascites. Musculoskeletal: Punctate radiopaque densities are seen in the soft tissues in the right gluteal region and minimally in the left gluteal region, similar to prior study. No acute fracture. Mild chronic compression deformity of T12 is unchanged. IMPRESSION: 1. Patchy airspace consolidation in the bilateral lower lobes, left greater than right and minimally in the right upper lobe worrisome for multifocal pneumonia. 2. Trace left pleural effusion. 3. Secretions  in the distal trachea and left mainstem bronchus. 4. Diffusely prominent mediastinal and hilar lymph nodes, likely reactive. 5. Cholelithiasis. 6. Scattered air-fluid levels throughout the colon compatible with diarrheal illness. 7. Stable punctate radiopaque densities in the soft tissues of the right gluteal region and minimally in the left gluteal region, similar to prior study. 8. Subcentimeter left Bosniak II renal cyst, too small  to characterize. No follow-up imaging is recommended. JACR 2018 Feb; 264-273, Management of the Incidental Renal Mass on CT, [employer] 2021; [id number], Bosniak Classification of Cystic Renal Masses, Version 2019. Aortic Atherosclerosis (ICD10-I70.0). Electronically Signed   By: Darliss Cheney M.D.   On: 02/21/2023 18:05   VAS [place] LOWER EXTREMITY VENOUS (DVT)  Result Date: 02/19/2023  Lower Venous DVT Study Patient Name:  HUSSAIN NATALE  Date of Exam:   02/18/2023 Medical Rec #: [id number]        Accession #:    [id number] Date of Birth: [birth year]        Patient Gender: M Patient Age:   61 years Exam Location:  [facility] Procedure:      VAS [place] LOWER EXTREMITY VENOUS (DVT) Referring Phys: Levon Hedger --------------------------------------------------------------------------------  Indications: Stroke.  Comparison Study: No prior study. Performing Technologist: Fernande Bras  Examination Guidelines: A complete evaluation includes B-mode imaging, spectral Doppler, color Doppler, and power Doppler as needed of all accessible portions of each vessel. Bilateral testing is considered an integral part of a complete examination. Limited examinations for reoccurring indications may be performed as noted. The reflux portion of the exam is performed with the patient in reverse Trendelenburg.  +---------+---------------+---------+-----------+----------+-----------------+ RIGHT    CompressibilityPhasicitySpontaneityPropertiesThrombus Aging     +---------+---------------+---------+-----------+----------+-----------------+ CFV      Full           Yes      Yes                                    +---------+---------------+---------+-----------+----------+-----------------+ SFJ      Full                                                           +---------+---------------+---------+-----------+----------+-----------------+ FV Prox  Full                                                           +---------+---------------+---------+-----------+----------+-----------------+ FV Mid   Full                                                           +---------+---------------+---------+-----------+----------+-----------------+ FV DistalFull                                                           +---------+---------------+---------+-----------+----------+-----------------+ PFV      Full                                                           +---------+---------------+---------+-----------+----------+-----------------+  POP      Full           Yes      Yes                                    +---------+---------------+---------+-----------+----------+-----------------+ PTV      None           No       No                   Age Indeterminate +---------+---------------+---------+-----------+----------+-----------------+ PERO     None           No       No                   Age Indeterminate +---------+---------------+---------+-----------+----------+-----------------+   +---------+---------------+---------+-----------+----------+--------------+ LEFT     CompressibilityPhasicitySpontaneityPropertiesThrombus Aging +---------+---------------+---------+-----------+----------+--------------+ CFV      Full           Yes      Yes                                 +---------+---------------+---------+-----------+----------+--------------+ SFJ      Full                                                         +---------+---------------+---------+-----------+----------+--------------+ FV Prox  Full                                                        +---------+---------------+---------+-----------+----------+--------------+ FV Mid   Full                                                        +---------+---------------+---------+-----------+----------+--------------+ FV DistalFull                                                        +---------+---------------+---------+-----------+----------+--------------+ PFV      Full                                                        +---------+---------------+---------+-----------+----------+--------------+ POP      Full           Yes      Yes                                 +---------+---------------+---------+-----------+----------+--------------+ PTV      Full                                                        +---------+---------------+---------+-----------+----------+--------------+  PERO     None           No       No                   Acute          +---------+---------------+---------+-----------+----------+--------------+     Summary: BILATERAL: -No evidence of popliteal cyst, bilaterally. RIGHT: - Findings consistent with age indeterminate deep vein thrombosis involving the right posterior tibial veins, and right peroneal veins.   LEFT: - Findings consistent with acute deep vein thrombosis involving the left posterior tibial veins, and left peroneal veins.   *See table(s) above for measurements and observations. Electronically signed by Coral Else MD on 02/19/2023 at 9:26:31 PM.    Final    DG Abd Portable 1V  Result Date: 02/19/2023 CLINICAL DATA:  Feeding tube placement. EXAM: PORTABLE ABDOMEN - 1 VIEW COMPARISON:  Same day. FINDINGS: Distal tip of feeding tube is seen in expected position of distal stomach. IMPRESSION: Distal tip of feeding tube is seen in expected position of distal stomach.  Electronically Signed   By: Lupita Raider M.D.   On: 02/19/2023 12:24   DG CHEST PORT 1 VIEW  Result Date: 02/19/2023 CLINICAL DATA:  Pneumonia.  Intubated. EXAM: PORTABLE CHEST 1 VIEW COMPARISON:  Chest x-ray dated February 15, 2023. FINDINGS: Endotracheal tube tip 5 cm above the carina. Enteric tube within the stomach. The heart size and mediastinal contours are within normal limits. Basilar and peripheral predominant patchy airspace opacities in both lungs have improved over the past 4 days. No pleural effusion or pneumothorax. No acute osseous abnormality. IMPRESSION: 1. Improving multifocal pneumonia. Electronically Signed   By: Obie Dredge M.D.   On: 02/19/2023 10:32   DG Abd Portable 1V  Result Date: 02/19/2023 CLINICAL DATA:  Enteric tube placement. EXAM: PORTABLE ABDOMEN - 1 VIEW COMPARISON:  Abdominal x-ray from yesterday. FINDINGS: Enteric tube remains appropriately positioned in the stomach. Visualized bowel gas pattern is normal. Patchy airspace disease in both lung bases, not significantly changed. IMPRESSION: 1. Enteric tube appropriately positioned in the stomach. Electronically Signed   By: Obie Dredge M.D.   On: 02/19/2023 10:31   EEG adult  Result Date: 02/19/2023 Charlsie Quest, MD     02/19/2023  8:13 AM Patient Name: NOSSON COVAULT MRN: [id number] Epilepsy Attending: Charlsie Quest Referring Physician/Provider: Lorin Glass, MD Date: 02/18/2023 Duration: 24.35 mins Patient history:  57yo M with ams getting eeg to evaluate for seizure  Level of alertness:  comatose  AEDs during EEG study: Propofol, Phenobarb  Technical aspects: This EEG study was done with scalp electrodes positioned according to the 10-20 International system of electrode placement. Electrical activity was reviewed with band pass filter of 1-70Hz , sensitivity of 7 uV/mm, display speed of 68mm/sec with a 60Hz  notched filter applied as appropriate. EEG data were recorded continuously and digitally  stored.  Video monitoring was available and reviewed as appropriate.  Description: EEG showed continuous generalized predominantly 5 to 6 Hz theta slowing admixed with intermittent 2-3hz  delta slowing. Hyperventilation and photic stimulation were not performed.    ABNORMALITY - Continuous slow, generalized  IMPRESSION: This study is suggestive of moderate to severe diffuse encephalopathy. No seizures or epileptiform discharges were seen throughout the recording.  Charlsie Quest  DG Abd 1 View  Result Date: 02/18/2023 CLINICAL DATA:  Confirm OG tube placement. EXAM: ABDOMEN - 1 VIEW COMPARISON:  02/17/2023. FINDINGS: The bowel gas pattern is  normal. An enteric tube terminates in the stomach and appears appropriate in position. Patchy airspace disease is present in the lungs bilaterally and increased from the prior exam. IMPRESSION: 1. OG tube terminates in the stomach and appears appropriate in position. 2. Nonobstructive bowel-gas pattern. 3. Patchy airspace disease at the lung bases, increased from the prior exam, possible edema or multifocal pneumonia. Electronically Signed   By: Thornell Sartorius M.D.   On: 02/18/2023 21:43   ECHO TEE  Result Date: 02/18/2023    TRANSESOPHOGEAL ECHO REPORT   Patient Name:   FINIS HOWARD Date of Exam: 02/18/2023 Medical Rec #:  [id number]       Height:       72.0 in Accession #:    [id number]      Weight:       157.2 lb Date of Birth:  [birth year]       BSA:          1.923 m Patient Age:    57 years        BP:           108/64 mmHg Patient Gender: M               HR:           94 bpm. Exam Location:  Inpatient Procedure: Transesophageal Echo, Cardiac Doppler and Color Doppler Indications:     Endocarditis  History:         Patient has prior history of Echocardiogram examinations, most                  recent 02/16/2023. Stroke.  Sonographer:     Darlys Gales Referring Phys:  [id number] Perlie Gold Diagnosing Phys: Wilfred Lacy PROCEDURE: After discussion of the risks and  benefits of a TEE, an informed consent was obtained from a family member. The transesophogeal probe was passed without difficulty through the esophogus of the patient. Sedation performed by different physician. The patient developed no complications during the procedure.  IMPRESSIONS  1. Left ventricular ejection fraction, by estimation, is 55 to 60%. The left ventricle has normal function. The left ventricle has no regional wall motion abnormalities.  2. Right ventricular systolic function is normal. The right ventricular size is normal. Tricuspid regurgitation signal is inadequate for assessing PA pressure.  3. Left atrial size was mildly dilated. No left atrial/left atrial appendage thrombus was detected.  4. The mitral valve is normal in structure. Trivial mitral valve regurgitation. No evidence of mitral stenosis.  5. The aortic valve is tricuspid. Aortic valve regurgitation is not visualized. No aortic stenosis is present.  6. Bubble study positive suggesting PFO. FINDINGS  Left Ventricle: Left ventricular ejection fraction, by estimation, is 55 to 60%. The left ventricle has normal function. The left ventricle has no regional wall motion abnormalities. The left ventricular internal cavity size was normal in size. There is  no left ventricular hypertrophy. Right Ventricle: The right ventricular size is normal. No increase in right ventricular wall thickness. Right ventricular systolic function is normal. Tricuspid regurgitation signal is inadequate for assessing PA pressure. Left Atrium: Left atrial size was mildly dilated. No left atrial/left atrial appendage thrombus was detected. Right Atrium: Right atrial size was normal in size. Pericardium: There is no evidence of pericardial effusion. Mitral Valve: The mitral valve is normal in structure. Trivial mitral valve regurgitation. No evidence of mitral valve stenosis. Tricuspid Valve: The tricuspid valve is normal in structure. Tricuspid valve regurgitation is  trivial. Aortic  Valve: The aortic valve is tricuspid. Aortic valve regurgitation is not visualized. No aortic stenosis is present. Pulmonic Valve: The pulmonic valve was normal in structure. Pulmonic valve regurgitation is trivial. Aorta: The aortic root and ascending aorta are structurally normal, with no evidence of dilitation. IAS/Shunts: Bubble study positive suggesting PFO. [employer] Electronically signed by Wilfred Lacy Signature Date/Time: 02/18/2023/2:44:53 PM    Final    DG FL GUIDED LUMBAR PUNCTURE  Result Date: 02/17/2023 CLINICAL DATA:  Provided history: Meningitis. EXAM: DIAGNOSTIC LUMBAR PUNCTURE UNDER FLUOROSCOPIC GUIDANCE COMPARISON:  CT chest/abdomen/pelvis 12/25/2020 FLUOROSCOPY: Radiation Exposure Index (as provided by the fluoroscopic device): 7.90 mGy Kerma PROCEDURE: Due to the patient's altered mental status, informed consent was obtained the patient's uncle Elgie Congo) via telephone prior to the procedure. This process included a discussion of procedure risk. The patient was positioned prone on the fluoroscopy table. An appropriate skin entry site was determined under fluoroscopy and marked. A time-out was performed. The operator donned sterile gloves and a mask. The lower back was prepped and draped in the usual sterile fashion. Local anesthesia was provided with 1% lidocaine. Under intermittent fluoroscopy, lumbar puncture was performed at the L4-L5 level using a 20 gauge spinal needle with return of clear/colorless CSF. 12.5 mL of CSF were collected for laboratory studies. The inner stylet was replaced within the needle and the needle was removed in its entirety. A dressing was applied at the skin entry site. No immediate post-procedure complication was apparent. IMPRESSION: 1. Fluoroscopically-guided L4-L5 lumbar puncture. 2. 12.5 mL of CSF collected and sent for laboratory studies. 3. No immediate post-procedure complication. Electronically Signed   By: Jackey Loge D.O.    On: 02/17/2023 15:08   DG Abd Portable 1V  Result Date: 02/17/2023 CLINICAL DATA:  Feeding tube placement. EXAM: PORTABLE ABDOMEN - 1 VIEW COMPARISON:  Radiograph 02/15/2023 FINDINGS: The previous enteric tube has been removed. There is a new weighted enteric tube with tip in the right upper quadrant in the region of the distal stomach. Nonobstructive upper abdominal bowel gas pattern. IMPRESSION: Weighted enteric tube with tip in the region of the distal stomach. Electronically Signed   By: Narda Rutherford M.D.   On: 02/17/2023 15:04   CT ANGIO HEAD NECK W WO CM  Result Date: 02/16/2023 CLINICAL DATA:  Follow-up examination for stroke. EXAM: CT ANGIOGRAPHY HEAD AND NECK WITH AND WITHOUT CONTRAST TECHNIQUE: Multidetector CT imaging of the head and neck was performed using the standard protocol during bolus administration of intravenous contrast. Multiplanar CT image reconstructions and MIPs were obtained to evaluate the vascular anatomy. Carotid stenosis measurements (when applicable) are obtained utilizing NASCET criteria, using the distal internal carotid diameter as the denominator. RADIATION DOSE REDUCTION: This exam was performed according to the departmental dose-optimization program which includes automated exposure control, adjustment of the mA and/or kV according to patient size and/or use of iterative reconstruction technique. CONTRAST:  75mL OMNIPAQUE IOHEXOL 350 MG/ML SOLN COMPARISON:  Prior brain MRI from earlier the same day. FINDINGS: CT HEAD FINDINGS Brain: Cerebral volume within normal limits. Previously identified scattered small volume infarcts involving the bilateral cerebral and cerebellar hemispheres are not well visualized by CT. No other acute large vessel territory infarct. No acute intracranial hemorrhage. No mass lesion or midline shift. No hydrocephalus or extra-axial fluid collection. Vascular: No abnormal hyperdense vessel. Skull: Scalp soft tissues demonstrate no acute  finding. Calvarium intact. Sinuses/Orbits: Globes and orbital soft tissues within normal limits. Moderate mucosal thickening present throughout the sphenoid ethmoidal and  left maxillary sinus. No significant mastoid effusion. Patient is intubated. Other: None. Review of the MIP images confirms the above findings CTA NECK FINDINGS Aortic arch: Visualized aortic arch within normal limits for caliber with standard branch pattern. No stenosis about the origin the great vessels. Right carotid system: Right common and internal carotid arteries are patent without dissection. Minimal plaque about the right carotid bulb without stenosis. Left carotid system: Left common and internal carotid arteries are patent without dissection. Minimal plaque about the left carotid bulb without stenosis. Vertebral arteries: Both vertebral arteries arise from subclavian arteries. No proximal subclavian artery stenosis. Vertebral arteries are patent without stenosis or dissection. Left vertebral artery slightly dominant. Skeleton: No discrete or worrisome osseous lesions. Other neck: Endotracheal and enteric tubes in place. Prominent left posterior chain lymph nodes noted, largest of which measures 1.5 cm (series 13, image 109), indeterminate. No other acute abnormality. Upper chest: Scattered patchy opacities within the partially visualized left lung, suspicious for pneumonia. Mildly prominent mediastinal lymph nodes measure up to 1.2 cm. Review of the MIP images confirms the above findings CTA HEAD FINDINGS Anterior circulation: Mild atheromatous change about the carotid siphons without hemodynamically significant stenosis. A1 segments patent bilaterally. Normal anterior communicating artery complex. Anterior cerebral arteries patent without stenosis. No M1 stenosis or occlusion. No proximal MCA branch occlusion or high-grade stenosis. Distal MCA branches perfused and symmetric. Posterior circulation: Both V4 segments patent without  stenosis. Both PICA grossly patent at their origins. Basilar patent without stenosis. Superior cerebellar and posterior cerebral arteries patent bilaterally. Venous sinuses: Grossly patent allowing for timing the contrast bolus. Anatomic variants: None significant.  No aneurysm. Review of the MIP images confirms the above findings IMPRESSION: CT HEAD: 1. Previously identified scattered small volume infarcts involving the bilateral cerebral and cerebellar hemispheres not well visualized by CT. 2. No other acute intracranial abnormality. CTA HEAD AND NECK: 1. Negative CTA for large vessel occlusion or other emergent finding. 2. Mild for age atheromatous change about the carotid bifurcations and carotid siphons without hemodynamically significant stenosis. 3. Scattered patchy opacities within the partially visualized left lung, suspicious for pneumonia. 4. Mildly enlarged mediastinal and left posterior chain lymph nodes, indeterminate, but could be reactive. Electronically Signed   By: Rise Mu M.D.   On: 02/16/2023 22:53   ECHOCARDIOGRAM COMPLETE  Result Date: 02/16/2023    ECHOCARDIOGRAM REPORT   Patient Name:   SERENITY RAPA Date of Exam: 02/16/2023 Medical Rec #:  [id number]       Height:       72.0 in Accession #:    [id number]      Weight:       165.6 lb Date of Birth:  [birth year]       BSA:          1.966 m Patient Age:    57 years        BP:           116/74 mmHg Patient Gender: M               HR:           65 bpm. Exam Location:  Inpatient Procedure: 2D Echo, Color Doppler and Cardiac Doppler STAT ECHO Indications:    stroke  History:        Patient has no prior history of Echocardiogram examinations.                 Sepsis.; Signs/Symptoms:Altered Mental Status.  Sonographer:  Delcie Roch RDCS Referring Phys: [id number] SRISHTI L BHAGAT  Sonographer Comments: Echo performed with patient supine and on artificial respirator. IMPRESSIONS  1. Left ventricular ejection fraction, by  estimation, is 55 to 60%. The left ventricle has normal function. The left ventricle has no regional wall motion abnormalities. Left ventricular diastolic parameters are consistent with Grade II diastolic dysfunction (pseudonormalization).  2. RV-RA gradient 33 mmHg suggesting at least mild to moderate increase in RVSP depending on CVP. Right ventricular systolic function is normal. The right ventricular size is normal.  3. Left atrial size was moderately dilated.  4. The mitral valve is abnormal, mildly calcified with somewhat restricted posterior leaflet. Mild to moderate mitral valve regurgitation.  5. The aortic valve is tricuspid. Aortic valve regurgitation is not visualized.  6. The inferior vena cava is normal in size with <50% respiratory variability, suggesting right atrial pressure of 8 mmHg. Comparison(s): No prior Echocardiogram. FINDINGS  Left Ventricle: Left ventricular ejection fraction, by estimation, is 55 to 60%. The left ventricle has normal function. The left ventricle has no regional wall motion abnormalities. The left ventricular internal cavity size was normal in size. There is  no left ventricular hypertrophy. Left ventricular diastolic parameters are consistent with Grade II diastolic dysfunction (pseudonormalization). Right Ventricle: RV-RA gradient 33 mmHg suggesting at least mild to moderate increase in RVSP depending on CVP. The right ventricular size is normal. No increase in right ventricular wall thickness. Right ventricular systolic function is normal. Left Atrium: Left atrial size was moderately dilated. Right Atrium: Right atrial size was normal in size. Pericardium: There is no evidence of pericardial effusion. Mitral Valve: The mitral valve is abnormal. There is mild calcification of the mitral valve leaflet(s). Mild to moderate mitral valve regurgitation. Tricuspid Valve: The tricuspid valve is grossly normal. Tricuspid valve regurgitation is mild. Aortic Valve: The aortic valve  is tricuspid. There is mild aortic valve annular calcification. Aortic valve regurgitation is not visualized. Pulmonic Valve: The pulmonic valve was grossly normal. Pulmonic valve regurgitation is trivial. Aorta: The aortic root and ascending aorta are structurally normal, with no evidence of dilitation. Venous: IVC assessment for right atrial pressure unable to be performed due to mechanical ventilation. The inferior vena cava is normal in size with less than 50% respiratory variability, suggesting right atrial pressure of 8 mmHg. IAS/Shunts: No atrial level shunt detected by color flow Doppler.  LEFT VENTRICLE PLAX 2D LVIDd:         5.60 cm   Diastology LVIDs:         3.40 cm   LV e' medial:    7.94 cm/s LV PW:         1.00 cm   LV E/e' medial:  9.5 LV IVS:        1.00 cm   LV e' lateral:   12.40 cm/s LVOT diam:     2.30 cm   LV E/e' lateral: 6.1 LV SV:         91 LV SV Index:   46 LVOT Area:     4.15 cm  RIGHT VENTRICLE             IVC RV Basal diam:  2.80 cm     IVC diam: 2.00 cm RV S prime:     13.70 cm/s TAPSE (M-mode): 2.7 cm LEFT ATRIUM             Index        RIGHT ATRIUM  Index LA diam:        4.30 cm 2.19 cm/m   RA Area:     17.20 cm LA Vol (A2C):   95.0 ml 48.32 ml/m  RA Volume:   49.40 ml  25.13 ml/m LA Vol (A4C):   83.7 ml 42.57 ml/m LA Biplane Vol: 91.9 ml 46.74 ml/m  AORTIC VALVE LVOT Vmax:   101.00 cm/s LVOT Vmean:  66.900 cm/s LVOT VTI:    0.220 m  AORTA Ao Root diam: 3.10 cm Ao Asc diam:  3.50 cm MITRAL VALVE                  TRICUSPID VALVE MV Area (PHT): 4.15 cm       TR Peak grad:   32.7 mmHg MV Decel Time: 183 msec       TR Vmax:        286.00 cm/s MR Peak grad:    109.4 mmHg MR Mean grad:    76.0 mmHg    SHUNTS MR Vmax:         523.00 cm/s  Systemic VTI:  0.22 m MR Vmean:        419.0 cm/s   Systemic Diam: 2.30 cm MR PISA:         1.01 cm MR PISA Eff ROA: 8 mm MR PISA Radius:  0.40 cm MV E velocity: 75.40 cm/s MV A velocity: 65.40 cm/s MV E/A ratio:  1.15 Nona Dell  MD Electronically signed by Nona Dell MD Signature Date/Time: 02/16/2023/2:44:07 PM    Final    MR BRAIN W WO CONTRAST  Result Date: 02/16/2023 CLINICAL DATA:  Altered mental status EXAM: MRI HEAD WITHOUT AND WITH CONTRAST TECHNIQUE: Multiplanar, multiecho pulse sequences of the brain and surrounding structures were obtained without and with intravenous contrast. CONTRAST:  7mL GADAVIST GADOBUTROL 1 MMOL/ML IV SOLN COMPARISON:  Brain MRI 02/13/2023 FINDINGS: Brain: Again seen is small early subacute infarct in the left occipital periventricular white matter (2-23), unchanged. There are scattered new small acute infarcts in the bilateral cerebral and cerebellar hemispheres, the largest in the left corona radiata/centrum semiovale. There is no associated hemorrhage or mass effect. Findings are favored embolic in etiology. There is no acute intracranial hemorrhage or extra-axial fluid collection. Parenchymal volume is stable. The ventricles are stable in size. A small focus of encephalomalacia in the left frontal lobe, small remote cerebellar infarcts, and small remote infarct in the left pons are unchanged. Parenchymal signal is otherwise essentially normal. The pituitary and suprasellar region are normal. There is no mass lesion or abnormal enhancement. There is no mass effect or midline shift. Vascular: Normal flow voids. Skull and upper cervical spine: Normal marrow signal. Sinuses/Orbits: There is moderate mucosal thickening throughout the paranasal sinuses. The globes and orbits are unremarkable. Other: A midline nasopharyngeal Tornwaldt cyst is noted. IMPRESSION: Scattered small new acute infarcts in the bilateral cerebral and cerebellar hemispheres, favored embolic in etiology. Electronically Signed   By: Lesia Hausen M.D.   On: 02/16/2023 14:12   DG CHEST PORT 1 VIEW  Result Date: 02/15/2023 CLINICAL DATA:  Aspiration pneumonia.  OG tube placement EXAM: PORTABLE CHEST 1 VIEW COMPARISON:   02/14/2023 FINDINGS: Endotracheal tube tip is 1 cm above the carina. OG tube is in the stomach. Patchy bilateral airspace disease most pronounced in the lower lobes, worsening since prior study. No effusions or pneumothorax. No acute bony abnormality. IMPRESSION: Worsening patchy bilateral airspace disease, most pronounced in the lower lobes. Electronically Signed  By: Charlett Nose M.D.   On: 02/15/2023 19:26   DG Abd 1 View  Result Date: 02/15/2023 CLINICAL DATA:  OG tube placement EXAM: ABDOMEN - 1 VIEW COMPARISON:  02/12/2023 FINDINGS: OG tube tip in the fundus of the stomach. IMPRESSION: OG tube in the stomach. Electronically Signed   By: Charlett Nose M.D.   On: 02/15/2023 19:26   DG Chest Port 1 View  Result Date: 02/14/2023 CLINICAL DATA:  Respiratory failure. EXAM: PORTABLE CHEST 1 VIEW COMPARISON:  February 12, 2023. FINDINGS: Stable cardiomediastinal silhouette. Endotracheal and nasogastric tubes are in grossly good position. Stable bilateral patchy and interstitial opacities are noted. Bony thorax is unremarkable. IMPRESSION: Stable support apparatus. Stable bilateral lung opacities as noted above. Electronically Signed   By: Lupita Raider M.D.   On: 02/14/2023 10:58   Overnight EEG with video  Result Date: 02/14/2023 Charlsie Quest, MD     02/14/2023  9:50 AM Patient Name: KAPENA WALLA MRN: [id number] Epilepsy Attending: Charlsie Quest Referring Physician/Provider: Lynnell Catalan, MD Duration: 02/13/2023 1008 to 02/14/2023 1008 Patient history: 57yo M with ams getting eeg to evaluate for seizure Level of alertness:  comatose AEDs during EEG study: Propofol, Phenobarb Technical aspects: This EEG study was done with scalp electrodes positioned according to the 10-20 International system of electrode placement. Electrical activity was reviewed with band pass filter of 1-70Hz , sensitivity of 7 uV/mm, display speed of 17mm/sec with a 60Hz  notched filter applied as appropriate. EEG data were  recorded continuously and digitally stored.  Video monitoring was available and reviewed as appropriate. Description: EEG showed continuous generalized predominantly 5 to 6 Hz theta slowing admixed with intermittent 2-3hz  delta slowing. Hyperventilation and photic stimulation were not performed.   ABNORMALITY - Continuous slow, generalized IMPRESSION: This study is suggestive of moderate to severe diffuse encephalopathy. No seizures or epileptiform discharges were seen throughout the recording. Charlsie Quest   MR BRAIN W WO CONTRAST  Result Date: 02/13/2023 CLINICAL DATA:  CNS infection suspected.  Meningitis suspected EXAM: MRI HEAD WITHOUT AND WITH CONTRAST TECHNIQUE: Multiplanar, multiecho pulse sequences of the brain and surrounding structures were obtained without and with intravenous contrast. CONTRAST:  8mL GADAVIST GADOBUTROL 1 MMOL/ML IV SOLN COMPARISON:  Head CT from 2 days ago FINDINGS: Brain: Subcentimeter infarct in the periventricular white matter around the occipital horn of the left lateral ventricle. Chronic lacunar infarct in the left paramedian pons. Tiny chronic right cerebellar infarcts. Encephalomalacia involving the small area of the anterior left frontal cortex from uncertain remote insult. Generalized brain atrophy. No hemorrhage, hydrocephalus, mass, or collection. No evidence of subarachnoid debris by FLAIR imaging. Vascular: Normal flow voids. Skull and upper cervical spine: Subcutaneous infiltration into the left anterior scalp, favor scarring. Sinuses/Orbits: Suspect remote blowout fracture of the medial wall left orbit. Nose and sinus opacification in the setting of intubation. IMPRESSION: 1. No evidence of intracranial infection. 2. Subcentimeter acute infarct in the left occipital white matter. 3. 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 Nonspecific small area of left frontal lobe encephalomalacia. 4. Brain atrophy. Electronically Signed   By: Tiburcio Pea M.D.   On: 02/13/2023 06:46   DG FL GUIDED LUMBAR PUNCTURE  Result Date: 02/12/2023 CLINICAL DATA:  57 year old male with AMS, leukocytosis and fever concerning for meningitis. Bedside LP was performed per unsuccessful. Request for fluoro guided lumbar puncture. EXAM: DIAGNOSTIC LUMBAR PUNCTURE UNDER FLUOROSCOPIC GUIDANCE COMPARISON:  None Available. FLUOROSCOPY: Radiation Exposure Index (as provided by the fluoroscopic  device): 15.7 mGy Kerma PROCEDURE: The procedure was performed with an emergent consent due to patient's incapacity to make decisions, and inability to reach surrogate decision maker. With the patient prone, the lower back was prepped with Betadine. 1% Lidocaine was used for local anesthesia. Lumbar puncture was attempted at the L2-3, L3-4, and L5-S1 level using a 20 gauge needle, CSF could not be obtained despite optimal needle positioning. IMPRESSION: Unsuccessful attempts at fluoroscopic-guided lumbar puncture. Procedure was performed by Lawernce Ion, PA-C under supervision of Roanna Banning, MD. Electronically Signed   By: Roanna Banning M.D.   On: 02/12/2023 15:18   DG Chest Port 1 View  Result Date: 02/12/2023 CLINICAL DATA:  Endotracheal tube placement EXAM: PORTABLE CHEST 1 VIEW COMPARISON:  12/12/2022 FINDINGS: Endotracheal tube tip in the low intrathoracic trachea 1.8 cm from the carina. Enteric tube tip and side-port in the stomach. Stable cardiomediastinal silhouette. Patchy left-greater-than-right airspace and interstitial opacities increased from prior. No pleural effusion or pneumothorax. IMPRESSION: 1. Endotracheal tube tip in the low intrathoracic trachea 1.8 cm from the carina. 2. Increased patchy left-greater-than-right airspace and interstitial opacities suspicious for pneumonia. Electronically Signed   By: Minerva Fester M.D.   On: 02/12/2023 04:07   DG Abdomen 1 View  Result Date: 02/12/2023 CLINICAL DATA:  OG tube placement EXAM: ABDOMEN - 1 VIEW COMPARISON:  None  Available. FINDINGS: Enteric tube tip and side port in the stomach. The bowel gas pattern is nonobstructive. No radio-opaque calculi or other significant radiographic abnormality are seen. IMPRESSION: Enteric tube tip and side-port in the stomach. Electronically Signed   By: Minerva Fester M.D.   On: 02/12/2023 04:06   CT Soft Tissue Neck W Contrast  Result Date: 02/12/2023 CLINICAL DATA:  Nonpulsatile neck mass. EXAM: CT NECK WITH CONTRAST TECHNIQUE: Multidetector CT imaging of the neck was performed using the standard protocol following the bolus administration of intravenous contrast. RADIATION DOSE REDUCTION: This exam was performed according to the departmental dose-optimization program which includes automated exposure control, adjustment of the mA and/or kV according to patient size and/or use of iterative reconstruction technique. CONTRAST:  75mL OMNIPAQUE IOHEXOL 350 MG/ML SOLN COMPARISON:  None Available. FINDINGS: Pharynx and larynx: Limited assessment given presence of endotracheal tube. No visualized abnormality. Salivary glands: No inflammation, mass, or stone. Thyroid: Normal. Lymph nodes: There is a 1.6 cm lymph node at the posterior left neck (8:82) this is just posterior to the sternocleidomastoid. No other enlarged or abnormal density lymph nodes. Vascular: Negative. Limited intracranial: Negative. Visualized orbits: Negative. Mastoids and visualized paranasal sinuses: Ethmoid sinus mucosal thickening. Skeleton: No acute or aggressive process. Upper chest: Multifocal ground-glass opacity in the upper lobes, left worse than right. Other: None IMPRESSION: 1. A 1.6 cm lymph node at the posterior left neck, just posterior to the sternocleidomastoid. This is nonspecific, but may account for the palpable abnormality. 2. No abscess or drainable fluid collection. 3. Multifocal ground-glass opacity in the upper lobes, left worse than right, concerning for aspiration/infection. Electronically Signed    By: Deatra Robinson M.D.   On: 02/12/2023 03:30   DG Chest Portable 1 View  Result Date: 02/12/2023 CLINICAL DATA:  Altered EXAM: PORTABLE CHEST 1 VIEW COMPARISON:  12/24/2020 FINDINGS: Mild diffuse reticulo nodular opacity some of which is felt secondary to chronic lung disease. Suspect acute superimposed airspace disease in the infrahilar lungs. Normal cardiac size. No pneumothorax IMPRESSION: Mild diffuse reticulo nodular opacity some of which is felt secondary to chronic lung disease. Suspect acute superimposed airspace  disease/possible pneumonia in the infrahilar lungs. Electronically Signed   By: Jasmine Pang M.D.   On: 02/12/2023 00:46   CT HEAD CODE STROKE WO CONTRAST  Result Date: 02/11/2023 CLINICAL DATA:  Code stroke. Acute neurologic deficit. No further clinical history available. EXAM: CT HEAD WITHOUT CONTRAST TECHNIQUE: Contiguous axial images were obtained from the base of the skull through the vertex without intravenous contrast. RADIATION DOSE REDUCTION: This exam was performed according to the departmental dose-optimization program which includes automated exposure control, adjustment of the mA and/or kV according to patient size and/or use of iterative reconstruction technique. COMPARISON:  None Available. FINDINGS: Motion degraded examination complicated by multiple streak artifacts from objects outside the field of view. Brain: There is no mass, hemorrhage or extra-axial collection. The size and configuration of the ventricles and extra-axial CSF spaces are normal. The brain parenchyma is normal, without evidence of acute or chronic infarction. Vascular: No abnormal hyperdensity of the major intracranial arteries or dural venous sinuses. No intracranial atherosclerosis. Skull: The visualized skull base, calvarium and extracranial soft tissues are normal. Sinuses/Orbits: No fluid levels or advanced mucosal thickening of the visualized paranasal sinuses. No mastoid or middle ear effusion.  The orbits are normal. ASPECTS [facility] Stroke Program Early CT Score) - Ganglionic level infarction (caudate, lentiform nuclei, internal capsule, insula, M1-M3 cortex): 7 - Supraganglionic infarction (M4-M6 cortex): 3 Total score (0-10 with 10 being normal): 10 IMPRESSION: 1. Motion degraded examination complicated by multiple streak artifacts from objects outside the field of view. 2. No acute intracranial abnormality. 3. ASPECTS is 10. These results were communicated to Dr. Caryl Pina at 7:57 pm on 02/11/2023 by text page via the [facility] messaging system. Electronically Signed   By: Deatra Robinson M.D.   On: 02/11/2023 19:57    Pathology: NA  DG Swallowing Func-Speech Pathology  Result Date: 02/24/2023 Table formatting from the original result was not included. Modified Barium Swallow Study Patient Details Name: MIKIAH SIRKO MRN: [id number] Date of Birth: 1965/06/29 Today's Date: 02/24/2023 HPI/PMH: HPI: Marlo Bossert is a 57 year old male with presented to the emergency department on 10/30 with altered mental status. Was reportedly found wandering streets of [place] altered.  Pt with respiratory failure and intubated 10/30-11/6.  MRI 10/31 negative.  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 Pt with past medical history of sickle cell c/b priapism, GERD, chronic pain, retinal hemorrhage, tobacco use, marijuana use, vitamin D deficiency Clinical Impression: Clinical Impression: Pt presents with a mild-moderate oral and a mild pharyngeal dysphagia per results of MBSS completed today. Of note, pt received multiple sedating medication prior to [facility] and he was much more lethargic compared to earlier bedside assessment today. MBSS trials were limited to thin liquids and pudding due to his lethargy.  Anticipate the majority of his  swallowing deficits observed today are related to his current lethargic state.      Oral deficits characterized by poor oral acceptance and initiation (likely in the setting of lethargy), reduce labial resulting in moderate anterior spillage of thin liquids by spoon and cup. Pt unable to initiate use of straw. There was prolonged oral prep and transit of pudding trial with verbal cues needed to improve oral transit time. There was reduce oral coordination with liquids resulting in posterior spillage of thin liquids to the level of the pyriform sinuses before the swallow.  Pharyngeal deficits characterized by briefly delayed pharyngeal swallow initiation, reduced base of tongue retraction, reduced laryngeal vestibule closure, and reduced pharyngeal stripping.      Findings:   -No aspiration observed across PO trials on MBSS.   -There was x1 instance of trace, transient penetration of thin liquids by cup before/during the swallow. Penetrated liquid residue was ejected during the swallow.   -There was a min-mod amount of vallecular residue following pudding trials, which was reduced with a subsequent liquid wash.      Recommend initiation of a full liquids, thin liquid diet with strict adherence to aspiration precautions. Recommend only offering PO options if pt is fully awake/alert. Anticipate pt's diet will be able to be advanced at bedside per SLP with ongoing assessment. Factors that may increase risk of adverse event in presence of aspiration Rubye Oaks & Clearance Coots 2021): Factors that may increase risk of adverse event in presence of aspiration Rubye Oaks & Clearance Coots 2021): Dependence for feeding and/or oral hygiene; Reduced cognitive function; Frail or deconditioned Recommendations/Plan: Swallowing Evaluation Recommendations Swallowing Evaluation Recommendations Recommendations: PO diet PO Diet Recommendation: Full liquid diet; Thin liquids (Level 0) Liquid Administration via: Cup; Straw Medication Administration:  Crushed with puree Supervision: Full supervision/cueing for swallowing strategies; Full assist for feeding Swallowing strategies  : Small bites/sips; Slow rate Postural changes: Position pt fully upright for meals; Stay upright 30-60 min after meals Oral care recommendations: Oral care BID (2x/day) Treatment Plan Treatment Plan Treatment recommendations: Therapy as outlined in treatment plan below Follow-up recommendations: Skilled nursing-short term rehab (<3 hours/day) Functional status assessment: Patient has had a recent decline in their functional status and demonstrates the ability to make significant improvements in function in a reasonable and predictable amount of time. Treatment duration: 2 weeks Interventions: Aspiration precaution training; Patient/family education; Trials of upgraded texture/liquids; Diet toleration management by SLP Recommendations Recommendations for follow up therapy are one component of a multi-disciplinary discharge planning process, led by the attending physician.  Recommendations may be updated based on patient status, additional functional criteria and insurance authorization. Assessment: Orofacial Exam: Orofacial Exam Oral Cavity - Dentition: Missing dentition; Poor condition Orofacial Anatomy: WFL Oral Motor/Sensory Function: Unable to test Anatomy: Anatomy: WFL Boluses Administered: Boluses Administered Boluses Administered: Thin liquids (Level 0); Puree  Oral Impairment Domain: Oral Impairment Domain Tongue control during bolus hold: Posterior escape of less than half of bolus Bolus preparation/mastication: -- (not tested) Bolus transport/lingual motion: Slow tongue motion Oral residue: Trace residue lining oral structures Location of oral residue : Tongue Initiation of pharyngeal swallow : Valleculae  Pharyngeal Impairment Domain: Pharyngeal Impairment Domain Soft palate elevation: No bolus between soft palate (SP)/pharyngeal wall (PW) Laryngeal elevation: Complete superior  movement of thyroid cartilage with complete approximation of arytenoids to epiglottic petiole Anterior hyoid excursion: Complete anterior movement Epiglottic movement: Complete inversion Laryngeal vestibule closure: Incomplete, narrow column air/contrast in laryngeal vestibule Pharyngeal stripping wave : Present - diminished Pharyngeal contraction (A/P view only): N/A Pharyngoesophageal segment opening: Complete distension and complete duration, no obstruction of flow Tongue base retraction: Narrow column of contrast or air between tongue base and PPW Pharyngeal residue: Collection of residue within or on pharyngeal structures Location of pharyngeal residue: Valleculae  Esophageal Impairment Domain: Esophageal Impairment Domain Esophageal clearance upright position: Esophageal retention (brief with clearance) Pill: Pill Consistency administered: -- (not tested) Penetration/Aspiration Scale Score: Penetration/Aspiration Scale Score 1.  Material does not enter airway: Thin liquids (Level 0); Puree 2.  Material enters airway, remains ABOVE vocal cords then ejected out: Mildly  thick liquids (Level 2, nectar thick) Compensatory Strategies: Compensatory Strategies Compensatory strategies: Yes Liquid wash: Effective Effective Liquid Wash: Puree   General Information: Caregiver present: No  Diet Prior to this Study: NPO   Temperature : Febrile   Respiratory Status: WFL   Supplemental O2: Nasal cannula (2L)   History of Recent Intubation: Yes  Behavior/Cognition: Confused; Requires cueing; Lethargic/Drowsy Self-Feeding Abilities: Dependent for feeding No data recorded Volitional Cough: Unable to elicit Volitional Swallow: Unable to elicit Exam Limitations: Poor participation; Fatigue Goal Planning: Prognosis for improved oropharyngeal function: Fair Barriers to Reach Goals: Cognitive deficits; Motivation; Behavior; Medication No data recorded Patient/Family Stated Goal: drink a soda Consulted and agree with results and  recommendations: Patient; Nurse Pain: Pain Assessment Pain Assessment: No/denies pain Breathing: 0 Negative Vocalization: 0 Facial Expression: 0 Body Language: 0 Consolability: 0 PAINAD Score: 0 End of Session: Start Time:SLP Start Time (ACUTE ONLY): 1142 Stop Time: SLP Stop Time (ACUTE ONLY): 1155 Time Calculation:SLP Time Calculation (min) (ACUTE ONLY): 13 min Charges: No data recorded SLP visit diagnosis: SLP Visit Diagnosis: Dysphagia, oropharyngeal phase (R13.12) Past Medical History: Past Medical History: Diagnosis Date  Shortness of breath   Sickle cell anemia (HCC)  Past Surgical History: Past Surgical History: Procedure Laterality Date  LAPAROSCOPIC GASTROTOMY W/ REPAIR OF ULCER   Ellery Plunk 02/24/2023, 11:36 AM  CT CHEST ABDOMEN PELVIS W CONTRAST  Result Date: 02/21/2023 CLINICAL DATA:  Sepsis EXAM: CT CHEST, ABDOMEN, AND PELVIS WITH CONTRAST TECHNIQUE: Multidetector CT imaging of the chest, abdomen and pelvis was performed following the standard protocol during bolus administration of intravenous contrast. RADIATION DOSE REDUCTION: This exam was performed according to the departmental dose-optimization program which includes automated exposure control, adjustment of the mA and/or kV according to patient size and/or use of iterative reconstruction technique. CONTRAST:  80mL OMNIPAQUE IOHEXOL 350 MG/ML SOLN COMPARISON:  CT chest abdomen and pelvis 12/25/2020 FINDINGS: CT CHEST FINDINGS Cardiovascular: No significant vascular findings. Normal heart size. No pericardial effusion. Mediastinum/Nodes: Enteric tube is seen throughout the esophagus. Visualized thyroid gland is within normal limits. There are diffusely prominent mediastinal and hilar lymph nodes. Lungs/Pleura: There is a trace left pleural effusion. There is patchy airspace consolidation within the bilateral lower lobes, left greater than right and minimally within the right upper lobe. There is no pneumothorax. There secretions in the  distal trachea and left mainstem bronchus. Musculoskeletal: No chest wall mass or suspicious bone lesions identified. CT ABDOMEN PELVIS FINDINGS Hepatobiliary: No focal liver abnormality is seen. Small gallstones are again seen. No gallbladder wall thickening, or biliary dilatation. Pancreas: Unremarkable. No pancreatic ductal dilatation or surrounding inflammatory changes. Spleen: The spleen is small in size and diffusely calcified, unchanged. Adrenals/Urinary Tract: There are scattered rounded hypodensities in the left kidney which are too small to characterize, likely cysts. There is no hydronephrosis or perinephric stranding. The adrenal glands and bladder are within normal limits. Stomach/Bowel: Appendix appears normal. No evidence of bowel wall thickening, distention, or inflammatory changes. There are scattered air-fluid levels throughout the colon. Rectal tube in place. Enteric tube tip is in the gastric antrum. Vascular/Lymphatic: Aortic atherosclerosis. No enlarged abdominal or pelvic lymph nodes. Reproductive: Prostate is unremarkable. Other: No abdominal wall hernia or abnormality. No abdominopelvic ascites. Musculoskeletal: Punctate radiopaque densities are seen in the soft tissues in the right gluteal region and minimally in the left gluteal region, similar to prior study. No acute fracture. Mild chronic compression deformity of T12 is unchanged. IMPRESSION: 1. Patchy airspace consolidation in the bilateral  lower lobes, left greater than right and minimally in the right upper lobe worrisome for multifocal pneumonia. 2. Trace left pleural effusion. 3. Secretions in the distal trachea and left mainstem bronchus. 4. Diffusely prominent mediastinal and hilar lymph nodes, likely reactive. 5. Cholelithiasis. 6. Scattered air-fluid levels throughout the colon compatible with diarrheal illness. 7. Stable punctate radiopaque densities in the soft tissues of the right gluteal region and minimally in the left  gluteal region, similar to prior study. 8. Subcentimeter left Bosniak II renal cyst, too small to characterize. No follow-up imaging is recommended. JACR 2018 Feb; 264-273, Management of the Incidental Renal Mass on CT, RadioGraphics 2021; [id number], Bosniak Classification of Cystic Renal Masses, Version 2019. Aortic Atherosclerosis (ICD10-I70.0). Electronically Signed   By: Darliss Cheney M.D.   On: 02/21/2023 18:05   VAS [place] LOWER EXTREMITY VENOUS (DVT)  Result Date: 02/19/2023  Lower Venous DVT Study Patient Name:  NAGUAN MAZON  Date of Exam:   02/18/2023 Medical Rec #: [id number]        Accession #:    [id number] Date of Birth: 16-Oct-1965        Patient Gender: M Patient Age:   47 years Exam Location:  [facility] Procedure:      VAS [place] LOWER EXTREMITY VENOUS (DVT) Referring Phys: Levon Hedger --------------------------------------------------------------------------------  Indications: Stroke.  Comparison Study: No prior study. Performing Technologist: Fernande Bras  Examination Guidelines: A complete evaluation includes B-mode imaging, spectral Doppler, color Doppler, and power Doppler as needed of all accessible portions of each vessel. Bilateral testing is considered an integral part of a complete examination. Limited examinations for reoccurring indications may be performed as noted. The reflux portion of the exam is performed with the patient in reverse Trendelenburg.  +---------+---------------+---------+-----------+----------+-----------------+ RIGHT    CompressibilityPhasicitySpontaneityPropertiesThrombus Aging    +---------+---------------+---------+-----------+----------+-----------------+ CFV      Full           Yes      Yes                                    +---------+---------------+---------+-----------+----------+-----------------+ SFJ      Full                                                            +---------+---------------+---------+-----------+----------+-----------------+ FV Prox  Full                                                           +---------+---------------+---------+-----------+----------+-----------------+ FV Mid   Full                                                           +---------+---------------+---------+-----------+----------+-----------------+ FV DistalFull                                                           +---------+---------------+---------+-----------+----------+-----------------+  PFV      Full                                                           +---------+---------------+---------+-----------+----------+-----------------+ POP      Full           Yes      Yes                                    +---------+---------------+---------+-----------+----------+-----------------+ PTV      None           No       No                   Age Indeterminate +---------+---------------+---------+-----------+----------+-----------------+ PERO     None           No       No                   Age Indeterminate +---------+---------------+---------+-----------+----------+-----------------+   +---------+---------------+---------+-----------+----------+--------------+ LEFT     CompressibilityPhasicitySpontaneityPropertiesThrombus Aging +---------+---------------+---------+-----------+----------+--------------+ CFV      Full           Yes      Yes                                 +---------+---------------+---------+-----------+----------+--------------+ SFJ      Full                                                        +---------+---------------+---------+-----------+----------+--------------+ FV Prox  Full                                                        +---------+---------------+---------+-----------+----------+--------------+ FV Mid   Full                                                         +---------+---------------+---------+-----------+----------+--------------+ FV DistalFull                                                        +---------+---------------+---------+-----------+----------+--------------+ PFV      Full                                                        +---------+---------------+---------+-----------+----------+--------------+ POP      Full  Yes      Yes                                 +---------+---------------+---------+-----------+----------+--------------+ PTV      Full                                                        +---------+---------------+---------+-----------+----------+--------------+ PERO     None           No       No                   Acute          +---------+---------------+---------+-----------+----------+--------------+     Summary: BILATERAL: -No evidence of popliteal cyst, bilaterally. RIGHT: - Findings consistent with age indeterminate deep vein thrombosis involving the right posterior tibial veins, and right peroneal veins.   LEFT: - Findings consistent with acute deep vein thrombosis involving the left posterior tibial veins, and left peroneal veins.   *See table(s) above for measurements and observations. Electronically signed by Coral Else MD on 02/19/2023 at 9:26:31 PM.    Final    DG Abd Portable 1V  Result Date: 02/19/2023 CLINICAL DATA:  Feeding tube placement. EXAM: PORTABLE ABDOMEN - 1 VIEW COMPARISON:  Same day. FINDINGS: Distal tip of feeding tube is seen in expected position of distal stomach. IMPRESSION: Distal tip of feeding tube is seen in expected position of distal stomach. Electronically Signed   By: Lupita Raider M.D.   On: 02/19/2023 12:24   DG CHEST PORT 1 VIEW  Result Date: 02/19/2023 CLINICAL DATA:  Pneumonia.  Intubated. EXAM: PORTABLE CHEST 1 VIEW COMPARISON:  Chest x-ray dated February 15, 2023. FINDINGS: Endotracheal tube tip 5 cm above the carina. Enteric tube within  the stomach. The heart size and mediastinal contours are within normal limits. Basilar and peripheral predominant patchy airspace opacities in both lungs have improved over the past 4 days. No pleural effusion or pneumothorax. No acute osseous abnormality. IMPRESSION: 1. Improving multifocal pneumonia. Electronically Signed   By: Obie Dredge M.D.   On: 02/19/2023 10:32   DG Abd Portable 1V  Result Date: 02/19/2023 CLINICAL DATA:  Enteric tube placement. EXAM: PORTABLE ABDOMEN - 1 VIEW COMPARISON:  Abdominal x-ray from yesterday. FINDINGS: Enteric tube remains appropriately positioned in the stomach. Visualized bowel gas pattern is normal. Patchy airspace disease in both lung bases, not significantly changed. IMPRESSION: 1. Enteric tube appropriately positioned in the stomach. Electronically Signed   By: Obie Dredge M.D.   On: 02/19/2023 10:31   EEG adult  Result Date: 02/19/2023 Charlsie Quest, MD     02/19/2023  8:13 AM Patient Name: VANSON SOLDAN MRN: [id number] Epilepsy Attending: Charlsie Quest Referring Physician/Provider: Lorin Glass, MD Date: 02/18/2023 Duration: 24.35 mins Patient history:  57yo M with ams getting eeg to evaluate for seizure  Level of alertness:  comatose  AEDs during EEG study: Propofol, Phenobarb  Technical aspects: This EEG study was done with scalp electrodes positioned according to the 10-20 International system of electrode placement. Electrical activity was reviewed with band pass filter of 1-70Hz , sensitivity of 7 uV/mm, display speed of 70mm/sec with a 60Hz  notched filter applied as appropriate. EEG data were recorded continuously  and digitally stored.  Video monitoring was available and reviewed as appropriate.  Description: EEG showed continuous generalized predominantly 5 to 6 Hz theta slowing admixed with intermittent 2-3hz  delta slowing. Hyperventilation and photic stimulation were not performed.    ABNORMALITY - Continuous slow, generalized  IMPRESSION:  This study is suggestive of moderate to severe diffuse encephalopathy. No seizures or epileptiform discharges were seen throughout the recording.  Charlsie Quest  DG Abd 1 View  Result Date: 02/18/2023 CLINICAL DATA:  Confirm OG tube placement. EXAM: ABDOMEN - 1 VIEW COMPARISON:  02/17/2023. FINDINGS: The bowel gas pattern is normal. An enteric tube terminates in the stomach and appears appropriate in position. Patchy airspace disease is present in the lungs bilaterally and increased from the prior exam. IMPRESSION: 1. OG tube terminates in the stomach and appears appropriate in position. 2. Nonobstructive bowel-gas pattern. 3. Patchy airspace disease at the lung bases, increased from the prior exam, possible edema or multifocal pneumonia. Electronically Signed   By: Thornell Sartorius M.D.   On: 02/18/2023 21:43   ECHO TEE  Result Date: 02/18/2023    TRANSESOPHOGEAL ECHO REPORT   Patient Name:   ROHAIL SCHMOLDT Date of Exam: 02/18/2023 Medical Rec #:  [id number]       Height:       72.0 in Accession #:    [id number]      Weight:       157.2 lb Date of Birth:  05-21-1965       BSA:          1.923 m Patient Age:    57 years        BP:           108/64 mmHg Patient Gender: M               HR:           94 bpm. Exam Location:  Inpatient Procedure: Transesophageal Echo, Cardiac Doppler and Color Doppler Indications:     Endocarditis  History:         Patient has prior history of Echocardiogram examinations, most                  recent 02/16/2023. Stroke.  Sonographer:     Darlys Gales Referring Phys:  [id number] Perlie Gold Diagnosing Phys: Wilfred Lacy PROCEDURE: After discussion of the risks and benefits of a TEE, an informed consent was obtained from a family member. The transesophogeal probe was passed without difficulty through the esophogus of the patient. Sedation performed by different physician. The patient developed no complications during the procedure.  IMPRESSIONS  1. Left ventricular ejection  fraction, by estimation, is 55 to 60%. The left ventricle has normal function. The left ventricle has no regional wall motion abnormalities.  2. Right ventricular systolic function is normal. The right ventricular size is normal. Tricuspid regurgitation signal is inadequate for assessing PA pressure.  3. Left atrial size was mildly dilated. No left atrial/left atrial appendage thrombus was detected.  4. The mitral valve is normal in structure. Trivial mitral valve regurgitation. No evidence of mitral stenosis.  5. The aortic valve is tricuspid. Aortic valve regurgitation is not visualized. No aortic stenosis is present.  6. Bubble study positive suggesting PFO. FINDINGS  Left Ventricle: Left ventricular ejection fraction, by estimation, is 55 to 60%. The left ventricle has normal function. The left ventricle has no regional wall motion abnormalities. The left ventricular internal cavity size was normal in size. There is  no left ventricular hypertrophy. Right Ventricle: The right ventricular size is normal. No increase in right ventricular wall thickness. Right ventricular systolic function is normal. Tricuspid regurgitation signal is inadequate for assessing PA pressure. Left Atrium: Left atrial size was mildly dilated. No left atrial/left atrial appendage thrombus was detected. Right Atrium: Right atrial size was normal in size. Pericardium: There is no evidence of pericardial effusion. Mitral Valve: The mitral valve is normal in structure. Trivial mitral valve regurgitation. No evidence of mitral valve stenosis. Tricuspid Valve: The tricuspid valve is normal in structure. Tricuspid valve regurgitation is trivial. Aortic Valve: The aortic valve is tricuspid. Aortic valve regurgitation is not visualized. No aortic stenosis is present. Pulmonic Valve: The pulmonic valve was normal in structure. Pulmonic valve regurgitation is trivial. Aorta: The aortic root and ascending aorta are structurally normal, with no  evidence of dilitation. IAS/Shunts: Bubble study positive suggesting PFO. [employer] Electronically signed by Wilfred Lacy Signature Date/Time: 02/18/2023/2:44:53 PM    Final    DG FL GUIDED LUMBAR PUNCTURE  Result Date: 02/17/2023 CLINICAL DATA:  Provided history: Meningitis. EXAM: DIAGNOSTIC LUMBAR PUNCTURE UNDER FLUOROSCOPIC GUIDANCE COMPARISON:  CT chest/abdomen/pelvis 12/25/2020 FLUOROSCOPY: Radiation Exposure Index (as provided by the fluoroscopic device): 7.90 mGy Kerma PROCEDURE: Due to the patient's altered mental status, informed consent was obtained the patient's uncle Elgie Congo) via telephone prior to the procedure. This process included a discussion of procedure risk. The patient was positioned prone on the fluoroscopy table. An appropriate skin entry site was determined under fluoroscopy and marked. A time-out was performed. The operator donned sterile gloves and a mask. The lower back was prepped and draped in the usual sterile fashion. Local anesthesia was provided with 1% lidocaine. Under intermittent fluoroscopy, lumbar puncture was performed at the L4-L5 level using a 20 gauge spinal needle with return of clear/colorless CSF. 12.5 mL of CSF were collected for laboratory studies. The inner stylet was replaced within the needle and the needle was removed in its entirety. A dressing was applied at the skin entry site. No immediate post-procedure complication was apparent. IMPRESSION: 1. Fluoroscopically-guided L4-L5 lumbar puncture. 2. 12.5 mL of CSF collected and sent for laboratory studies. 3. No immediate post-procedure complication. Electronically Signed   By: Jackey Loge D.O.   On: 02/17/2023 15:08   DG Abd Portable 1V  Result Date: 02/17/2023 CLINICAL DATA:  Feeding tube placement. EXAM: PORTABLE ABDOMEN - 1 VIEW COMPARISON:  Radiograph 02/15/2023 FINDINGS: The previous enteric tube has been removed. There is a new weighted enteric tube with tip in the right upper quadrant in  the region of the distal stomach. Nonobstructive upper abdominal bowel gas pattern. IMPRESSION: Weighted enteric tube with tip in the region of the distal stomach. Electronically Signed   By: Narda Rutherford M.D.   On: 02/17/2023 15:04   CT ANGIO HEAD NECK W WO CM  Result Date: 02/16/2023 CLINICAL DATA:  Follow-up examination for stroke. EXAM: CT ANGIOGRAPHY HEAD AND NECK WITH AND WITHOUT CONTRAST TECHNIQUE: Multidetector CT imaging of the head and neck was performed using the standard protocol during bolus administration of intravenous contrast. Multiplanar CT image reconstructions and MIPs were obtained to evaluate the vascular anatomy. Carotid stenosis measurements (when applicable) are obtained utilizing NASCET criteria, using the distal internal carotid diameter as the denominator. RADIATION DOSE REDUCTION: This exam was performed according to the departmental dose-optimization program which includes automated exposure control, adjustment of the mA and/or kV according to patient size and/or use of iterative reconstruction technique. CONTRAST:  75mL OMNIPAQUE IOHEXOL 350 MG/ML SOLN COMPARISON:  Prior brain MRI from earlier the same day. FINDINGS: CT HEAD FINDINGS Brain: Cerebral volume within normal limits. Previously identified scattered small volume infarcts involving the bilateral cerebral and cerebellar hemispheres are not well visualized by CT. No other acute large vessel territory infarct. No acute intracranial hemorrhage. No mass lesion or midline shift. No hydrocephalus or extra-axial fluid collection. Vascular: No abnormal hyperdense vessel. Skull: Scalp soft tissues demonstrate no acute finding. Calvarium intact. Sinuses/Orbits: Globes and orbital soft tissues within normal limits. Moderate mucosal thickening present throughout the sphenoid ethmoidal and left maxillary sinus. No significant mastoid effusion. Patient is intubated. Other: None. Review of the MIP images confirms the above findings  CTA NECK FINDINGS Aortic arch: Visualized aortic arch within normal limits for caliber with standard branch pattern. No stenosis about the origin the great vessels. Right carotid system: Right common and internal carotid arteries are patent without dissection. Minimal plaque about the right carotid bulb without stenosis. Left carotid system: Left common and internal carotid arteries are patent without dissection. Minimal plaque about the left carotid bulb without stenosis. Vertebral arteries: Both vertebral arteries arise from subclavian arteries. No proximal subclavian artery stenosis. Vertebral arteries are patent without stenosis or dissection. Left vertebral artery slightly dominant. Skeleton: No discrete or worrisome osseous lesions. Other neck: Endotracheal and enteric tubes in place. Prominent left posterior chain lymph nodes noted, largest of which measures 1.5 cm (series 13, image 109), indeterminate. No other acute abnormality. Upper chest: Scattered patchy opacities within the partially visualized left lung, suspicious for pneumonia. Mildly prominent mediastinal lymph nodes measure up to 1.2 cm. Review of the MIP images confirms the above findings CTA HEAD FINDINGS Anterior circulation: Mild atheromatous change about the carotid siphons without hemodynamically significant stenosis. A1 segments patent bilaterally. Normal anterior communicating artery complex. Anterior cerebral arteries patent without stenosis. No M1 stenosis or occlusion. No proximal MCA branch occlusion or high-grade stenosis. Distal MCA branches perfused and symmetric. Posterior circulation: Both V4 segments patent without stenosis. Both PICA grossly patent at their origins. Basilar patent without stenosis. Superior cerebellar and posterior cerebral arteries patent bilaterally. Venous sinuses: Grossly patent allowing for timing the contrast bolus. Anatomic variants: None significant.  No aneurysm. Review of the MIP images confirms the  above findings IMPRESSION: CT HEAD: 1. Previously identified scattered small volume infarcts involving the bilateral cerebral and cerebellar hemispheres not well visualized by CT. 2. No other acute intracranial abnormality. CTA HEAD AND NECK: 1. Negative CTA for large vessel occlusion or other emergent finding. 2. Mild for age atheromatous change about the carotid bifurcations and carotid siphons without hemodynamically significant stenosis. 3. Scattered patchy opacities within the partially visualized left lung, suspicious for pneumonia. 4. Mildly enlarged mediastinal and left posterior chain lymph nodes, indeterminate, but could be reactive. Electronically Signed   By: Rise Mu M.D.   On: 02/16/2023 22:53   ECHOCARDIOGRAM COMPLETE  Result Date: 02/16/2023    ECHOCARDIOGRAM REPORT   Patient Name:   BERNALDO PENTICO Date of Exam: 02/16/2023 Medical Rec #:  [id number]       Height:       72.0 in Accession #:    [id number]      Weight:       165.6 lb Date of Birth:  September 22, 1965       BSA:          1.966 m Patient Age:    57 years        BP:  116/74 mmHg Patient Gender: M               HR:           65 bpm. Exam Location:  Inpatient Procedure: 2D Echo, Color Doppler and Cardiac Doppler STAT ECHO Indications:    stroke  History:        Patient has no prior history of Echocardiogram examinations.                 Sepsis.; Signs/Symptoms:Altered Mental Status.  Sonographer:    Delcie Roch RDCS Referring Phys: [id number] SRISHTI L BHAGAT  Sonographer Comments: Echo performed with patient supine and on artificial respirator. IMPRESSIONS  1. Left ventricular ejection fraction, by estimation, is 55 to 60%. The left ventricle has normal function. The left ventricle has no regional wall motion abnormalities. Left ventricular diastolic parameters are consistent with Grade II diastolic dysfunction (pseudonormalization).  2. RV-RA gradient 33 mmHg suggesting at least mild to moderate increase in RVSP  depending on CVP. Right ventricular systolic function is normal. The right ventricular size is normal.  3. Left atrial size was moderately dilated.  4. The mitral valve is abnormal, mildly calcified with somewhat restricted posterior leaflet. Mild to moderate mitral valve regurgitation.  5. The aortic valve is tricuspid. Aortic valve regurgitation is not visualized.  6. The inferior vena cava is normal in size with <50% respiratory variability, suggesting right atrial pressure of 8 mmHg. Comparison(s): No prior Echocardiogram. FINDINGS  Left Ventricle: Left ventricular ejection fraction, by estimation, is 55 to 60%. The left ventricle has normal function. The left ventricle has no regional wall motion abnormalities. The left ventricular internal cavity size was normal in size. There is  no left ventricular hypertrophy. Left ventricular diastolic parameters are consistent with Grade II diastolic dysfunction (pseudonormalization). Right Ventricle: RV-RA gradient 33 mmHg suggesting at least mild to moderate increase in RVSP depending on CVP. The right ventricular size is normal. No increase in right ventricular wall thickness. Right ventricular systolic function is normal. Left Atrium: Left atrial size was moderately dilated. Right Atrium: Right atrial size was normal in size. Pericardium: There is no evidence of pericardial effusion. Mitral Valve: The mitral valve is abnormal. There is mild calcification of the mitral valve leaflet(s). Mild to moderate mitral valve regurgitation. Tricuspid Valve: The tricuspid valve is grossly normal. Tricuspid valve regurgitation is mild. Aortic Valve: The aortic valve is tricuspid. There is mild aortic valve annular calcification. Aortic valve regurgitation is not visualized. Pulmonic Valve: The pulmonic valve was grossly normal. Pulmonic valve regurgitation is trivial. Aorta: The aortic root and ascending aorta are structurally normal, with no evidence of dilitation. Venous: IVC  assessment for right atrial pressure unable to be performed due to mechanical ventilation. The inferior vena cava is normal in size with less than 50% respiratory variability, suggesting right atrial pressure of 8 mmHg. IAS/Shunts: No atrial level shunt detected by color flow Doppler.  LEFT VENTRICLE PLAX 2D LVIDd:         5.60 cm   Diastology LVIDs:         3.40 cm   LV e' medial:    7.94 cm/s LV PW:         1.00 cm   LV E/e' medial:  9.5 LV IVS:        1.00 cm   LV e' lateral:   12.40 cm/s LVOT diam:     2.30 cm   LV E/e' lateral: 6.1 LV SV:  91 LV SV Index:   46 LVOT Area:     4.15 cm  RIGHT VENTRICLE             IVC RV Basal diam:  2.80 cm     IVC diam: 2.00 cm RV S prime:     13.70 cm/s TAPSE (M-mode): 2.7 cm LEFT ATRIUM             Index        RIGHT ATRIUM           Index LA diam:        4.30 cm 2.19 cm/m   RA Area:     17.20 cm LA Vol (A2C):   95.0 ml 48.32 ml/m  RA Volume:   49.40 ml  25.13 ml/m LA Vol (A4C):   83.7 ml 42.57 ml/m LA Biplane Vol: 91.9 ml 46.74 ml/m  AORTIC VALVE LVOT Vmax:   101.00 cm/s LVOT Vmean:  66.900 cm/s LVOT VTI:    0.220 m  AORTA Ao Root diam: 3.10 cm Ao Asc diam:  3.50 cm MITRAL VALVE                  TRICUSPID VALVE MV Area (PHT): 4.15 cm       TR Peak grad:   32.7 mmHg MV Decel Time: 183 msec       TR Vmax:        286.00 cm/s MR Peak grad:    109.4 mmHg MR Mean grad:    76.0 mmHg    SHUNTS MR Vmax:         523.00 cm/s  Systemic VTI:  0.22 m MR Vmean:        419.0 cm/s   Systemic Diam: 2.30 cm MR PISA:         1.01 cm MR PISA Eff ROA: 8 mm MR PISA Radius:  0.40 cm MV E velocity: 75.40 cm/s MV A velocity: 65.40 cm/s MV E/A ratio:  1.15 Nona Dell MD Electronically signed by Nona Dell MD Signature Date/Time: 02/16/2023/2:44:07 PM    Final    MR BRAIN W WO CONTRAST  Result Date: 02/16/2023 CLINICAL DATA:  Altered mental status EXAM: MRI HEAD WITHOUT AND WITH CONTRAST TECHNIQUE: Multiplanar, multiecho pulse sequences of the brain and surrounding  structures were obtained without and with intravenous contrast. CONTRAST:  7mL GADAVIST GADOBUTROL 1 MMOL/ML IV SOLN COMPARISON:  Brain MRI 02/13/2023 FINDINGS: Brain: Again seen is small early subacute infarct in the left occipital periventricular white matter (2-23), unchanged. There are scattered new small acute infarcts in the bilateral cerebral and cerebellar hemispheres, the largest in the left corona radiata/centrum semiovale. There is no associated hemorrhage or mass effect. Findings are favored embolic in etiology. There is no acute intracranial hemorrhage or extra-axial fluid collection. Parenchymal volume is stable. The ventricles are stable in size. A small focus of encephalomalacia in the left frontal lobe, small remote cerebellar infarcts, and small remote infarct in the left pons are unchanged. Parenchymal signal is otherwise essentially normal. The pituitary and suprasellar region are normal. There is no mass lesion or abnormal enhancement. There is no mass effect or midline shift. Vascular: Normal flow voids. Skull and upper cervical spine: Normal marrow signal. Sinuses/Orbits: There is moderate mucosal thickening throughout the paranasal sinuses. The globes and orbits are unremarkable. Other: A midline nasopharyngeal Tornwaldt cyst is noted. IMPRESSION: Scattered small new acute infarcts in the bilateral cerebral and cerebellar hemispheres, favored embolic in etiology. Electronically Signed   By:  Lesia Hausen M.D.   On: 02/16/2023 14:12   DG CHEST PORT 1 VIEW  Result Date: 02/15/2023 CLINICAL DATA:  Aspiration pneumonia.  OG tube placement EXAM: PORTABLE CHEST 1 VIEW COMPARISON:  02/14/2023 FINDINGS: Endotracheal tube tip is 1 cm above the carina. OG tube is in the stomach. Patchy bilateral airspace disease most pronounced in the lower lobes, worsening since prior study. No effusions or pneumothorax. No acute bony abnormality. IMPRESSION: Worsening patchy bilateral airspace disease, most  pronounced in the lower lobes. Electronically Signed   By: Charlett Nose M.D.   On: 02/15/2023 19:26   DG Abd 1 View  Result Date: 02/15/2023 CLINICAL DATA:  OG tube placement EXAM: ABDOMEN - 1 VIEW COMPARISON:  02/12/2023 FINDINGS: OG tube tip in the fundus of the stomach. IMPRESSION: OG tube in the stomach. Electronically Signed   By: Charlett Nose M.D.   On: 02/15/2023 19:26   DG Chest Port 1 View  Result Date: 02/14/2023 CLINICAL DATA:  Respiratory failure. EXAM: PORTABLE CHEST 1 VIEW COMPARISON:  February 12, 2023. FINDINGS: Stable cardiomediastinal silhouette. Endotracheal and nasogastric tubes are in grossly good position. Stable bilateral patchy and interstitial opacities are noted. Bony thorax is unremarkable. IMPRESSION: Stable support apparatus. Stable bilateral lung opacities as noted above. Electronically Signed   By: Lupita Raider M.D.   On: 02/14/2023 10:58   Overnight EEG with video  Result Date: 02/14/2023 Charlsie Quest, MD     02/14/2023  9:50 AM Patient Name: CAMEREN BASSETTI MRN: [id number] Epilepsy Attending: Charlsie Quest Referring Physician/Provider: Lynnell Catalan, MD Duration: 02/13/2023 1008 to 02/14/2023 1008 Patient history: 57yo M with ams getting eeg to evaluate for seizure Level of alertness:  comatose AEDs during EEG study: Propofol, Phenobarb Technical aspects: This EEG study was done with scalp electrodes positioned according to the 10-20 International system of electrode placement. Electrical activity was reviewed with band pass filter of 1-70Hz , sensitivity of 7 uV/mm, display speed of 12mm/sec with a 60Hz  notched filter applied as appropriate. EEG data were recorded continuously and digitally stored.  Video monitoring was available and reviewed as appropriate. Description: EEG showed continuous generalized predominantly 5 to 6 Hz theta slowing admixed with intermittent 2-3hz  delta slowing. Hyperventilation and photic stimulation were not performed.   ABNORMALITY -  Continuous slow, generalized IMPRESSION: This study is suggestive of moderate to severe diffuse encephalopathy. No seizures or epileptiform discharges were seen throughout the recording. Charlsie Quest   MR BRAIN W WO CONTRAST  Result Date: 02/13/2023 CLINICAL DATA:  CNS infection suspected.  Meningitis suspected EXAM: MRI HEAD WITHOUT AND WITH CONTRAST TECHNIQUE: Multiplanar, multiecho pulse sequences of the brain and surrounding structures were obtained without and with intravenous contrast. CONTRAST:  8mL GADAVIST GADOBUTROL 1 MMOL/ML IV SOLN COMPARISON:  Head CT from 2 days ago FINDINGS: Brain: Subcentimeter infarct in the periventricular white matter around the occipital horn of the left lateral ventricle. Chronic lacunar infarct in the left paramedian pons. Tiny chronic right cerebellar infarcts. Encephalomalacia involving the small area of the anterior left frontal cortex from uncertain remote insult. Generalized brain atrophy. No hemorrhage, hydrocephalus, mass, or collection. No evidence of subarachnoid debris by FLAIR imaging. Vascular: Normal flow voids. Skull and upper cervical spine: Subcutaneous infiltration into the left anterior scalp, favor scarring. Sinuses/Orbits: Suspect remote blowout fracture of the medial wall left orbit. Nose and sinus opacification in the setting of intubation. IMPRESSION: 1. No evidence of intracranial infection. 2. Subcentimeter acute infarct in the left occipital white  matter. 3. 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 Nonspecific small area of left frontal lobe encephalomalacia. 4. Brain atrophy. Electronically Signed   By: Tiburcio Pea M.D.   On: 02/13/2023 06:46   DG FL GUIDED LUMBAR PUNCTURE  Result Date: 02/12/2023 CLINICAL DATA:  57 year old male with AMS, leukocytosis and fever concerning for meningitis. Bedside LP was performed per unsuccessful. Request for fluoro guided lumbar puncture. EXAM: DIAGNOSTIC LUMBAR PUNCTURE UNDER  FLUOROSCOPIC GUIDANCE COMPARISON:  None Available. FLUOROSCOPY: Radiation Exposure Index (as provided by the fluoroscopic device): 15.7 mGy Kerma PROCEDURE: The procedure was performed with an emergent consent due to patient's incapacity to make decisions, and inability to reach surrogate decision maker. With the patient prone, the lower back was prepped with Betadine. 1% Lidocaine was used for local anesthesia. Lumbar puncture was attempted at the L2-3, L3-4, and L5-S1 level using a 20 gauge needle, CSF could not be obtained despite optimal needle positioning. IMPRESSION: Unsuccessful attempts at fluoroscopic-guided lumbar puncture. Procedure was performed by Lawernce Ion, PA-C under supervision of Roanna Banning, MD. Electronically Signed   By: Roanna Banning M.D.   On: 02/12/2023 15:18   DG Chest Port 1 View  Result Date: 02/12/2023 CLINICAL DATA:  Endotracheal tube placement EXAM: PORTABLE CHEST 1 VIEW COMPARISON:  12/12/2022 FINDINGS: Endotracheal tube tip in the low intrathoracic trachea 1.8 cm from the carina. Enteric tube tip and side-port in the stomach. Stable cardiomediastinal silhouette. Patchy left-greater-than-right airspace and interstitial opacities increased from prior. No pleural effusion or pneumothorax. IMPRESSION: 1. Endotracheal tube tip in the low intrathoracic trachea 1.8 cm from the carina. 2. Increased patchy left-greater-than-right airspace and interstitial opacities suspicious for pneumonia. Electronically Signed   By: Minerva Fester M.D.   On: 02/12/2023 04:07   DG Abdomen 1 View  Result Date: 02/12/2023 CLINICAL DATA:  OG tube placement EXAM: ABDOMEN - 1 VIEW COMPARISON:  None Available. FINDINGS: Enteric tube tip and side port in the stomach. The bowel gas pattern is nonobstructive. No radio-opaque calculi or other significant radiographic abnormality are seen. IMPRESSION: Enteric tube tip and side-port in the stomach. Electronically Signed   By: Minerva Fester M.D.   On:  02/12/2023 04:06   CT Soft Tissue Neck W Contrast  Result Date: 02/12/2023 CLINICAL DATA:  Nonpulsatile neck mass. EXAM: CT NECK WITH CONTRAST TECHNIQUE: Multidetector CT imaging of the neck was performed using the standard protocol following the bolus administration of intravenous contrast. RADIATION DOSE REDUCTION: This exam was performed according to the departmental dose-optimization program which includes automated exposure control, adjustment of the mA and/or kV according to patient size and/or use of iterative reconstruction technique. CONTRAST:  75mL OMNIPAQUE IOHEXOL 350 MG/ML SOLN COMPARISON:  None Available. FINDINGS: Pharynx and larynx: Limited assessment given presence of endotracheal tube. No visualized abnormality. Salivary glands: No inflammation, mass, or stone. Thyroid: Normal. Lymph nodes: There is a 1.6 cm lymph node at the posterior left neck (8:82) this is just posterior to the sternocleidomastoid. No other enlarged or abnormal density lymph nodes. Vascular: Negative. Limited intracranial: Negative. Visualized orbits: Negative. Mastoids and visualized paranasal sinuses: Ethmoid sinus mucosal thickening. Skeleton: No acute or aggressive process. Upper chest: Multifocal ground-glass opacity in the upper lobes, left worse than right. Other: None IMPRESSION: 1. A 1.6 cm lymph node at the posterior left neck, just posterior to the sternocleidomastoid. This is nonspecific, but may account for the palpable abnormality. 2. No abscess or drainable fluid collection. 3. Multifocal ground-glass opacity in the upper lobes, left worse than  right, concerning for aspiration/infection. Electronically Signed   By: Deatra Robinson M.D.   On: 02/12/2023 03:30   DG Chest Portable 1 View  Result Date: 02/12/2023 CLINICAL DATA:  Altered EXAM: PORTABLE CHEST 1 VIEW COMPARISON:  12/24/2020 FINDINGS: Mild diffuse reticulo nodular opacity some of which is felt secondary to chronic lung disease. Suspect acute  superimposed airspace disease in the infrahilar lungs. Normal cardiac size. No pneumothorax IMPRESSION: Mild diffuse reticulo nodular opacity some of which is felt secondary to chronic lung disease. Suspect acute superimposed airspace disease/possible pneumonia in the infrahilar lungs. Electronically Signed   By: Jasmine Pang M.D.   On: 02/12/2023 00:46   CT HEAD CODE STROKE WO CONTRAST  Result Date: 02/11/2023 CLINICAL DATA:  Code stroke. Acute neurologic deficit. No further clinical history available. EXAM: CT HEAD WITHOUT CONTRAST TECHNIQUE: Contiguous axial images were obtained from the base of the skull through the vertex without intravenous contrast. RADIATION DOSE REDUCTION: This exam was performed according to the departmental dose-optimization program which includes automated exposure control, adjustment of the mA and/or kV according to patient size and/or use of iterative reconstruction technique. COMPARISON:  None Available. FINDINGS: Motion degraded examination complicated by multiple streak artifacts from objects outside the field of view. Brain: There is no mass, hemorrhage or extra-axial collection. The size and configuration of the ventricles and extra-axial CSF spaces are normal. The brain parenchyma is normal, without evidence of acute or chronic infarction. Vascular: No abnormal hyperdensity of the major intracranial arteries or dural venous sinuses. No intracranial atherosclerosis. Skull: The visualized skull base, calvarium and extracranial soft tissues are normal. Sinuses/Orbits: No fluid levels or advanced mucosal thickening of the visualized paranasal sinuses. No mastoid or middle ear effusion. The orbits are normal. ASPECTS [facility] Stroke Program Early CT Score) - Ganglionic level infarction (caudate, lentiform nuclei, internal capsule, insula, M1-M3 cortex): 7 - Supraganglionic infarction (M4-M6 cortex): 3 Total score (0-10 with 10 being normal): 10 IMPRESSION: 1. Motion degraded  examination complicated by multiple streak artifacts from objects outside the field of view. 2. No acute intracranial abnormality. 3. ASPECTS is 10. These results were communicated to Dr. Caryl Pina at 7:57 pm on 02/11/2023 by text page via the [facility] messaging system. Electronically Signed   By: Deatra Robinson M.D.   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  Prior to this episode of admission, patient denies any known hypercoagulability, previous history of DVT or PE, B symptoms or known erythromelalgia or intractable pruritus after taking a warm shower. No concerns on physical exam, thin appearing male, no palpable lymphadenopathy or hepatosplenomegaly.  No lower extremity swelling noted on exam today.  Discussed common causes of thrombocytosis including primary versus secondary thrombocytosis.  Given normal platelet count on admission, despite it being extreme thrombocytosis, I believe this is likely secondary thrombocytosis reactive to the hospitalization.  No evidence of iron deficiency anemia.  I have recommended he continue anticoagulation as recommended by the neurology team and return to clinic to see me in 2 to 3 weeks for follow-up labs.  If he has  persistent and severe thrombocytosis at that time, I will pursue evaluation for myeloproliferative workup.  With regards to the bilateral DVT and acute strokes, I will also consider outpatient hypercoagulable workup.  He expressed understanding of the recommendations and his willing to  follow-up outpatient.  Thank you for consulting [place] in the care of this patient.  Please do not hesitate to contact us with any additional questions or concerns.  The length of time of the face-to-face encounter was 55 minutes. More than 50% of time was spent counseling and coordination of care.     Thank you for this referral.

## 2023-03-02 NOTE — Plan of Care (Signed)
  Problem: Education: Goal: Ability to describe self-care measures that may prevent or decrease complications (Diabetes Survival Skills Education) will improve Outcome: Progressing Goal: Individualized Educational Video(s) Outcome: Progressing   Problem: Coping: Goal: Ability to adjust to condition or change in health will improve Outcome: Progressing   Problem: Fluid Volume: Goal: Ability to maintain a balanced intake and output will improve Outcome: Progressing   Problem: Health Behavior/Discharge Planning: Goal: Ability to identify and utilize available resources and services will improve Outcome: Progressing Goal: Ability to manage health-related needs will improve Outcome: Progressing   Problem: Metabolic: Goal: Ability to maintain appropriate glucose levels will improve Outcome: Progressing   Problem: Nutritional: Goal: Maintenance of adequate nutrition will improve Outcome: Progressing Goal: Progress toward achieving an optimal weight will improve Outcome: Progressing   Problem: Skin Integrity: Goal: Risk for impaired skin integrity will decrease Outcome: Progressing   Problem: Tissue Perfusion: Goal: Adequacy of tissue perfusion will improve Outcome: Progressing   Problem: Safety: Goal: Non-violent Restraint(s) Outcome: Progressing   Problem: Education: Goal: Knowledge of General Education information will improve Description: Including pain rating scale, medication(s)/side effects and non-pharmacologic comfort measures Outcome: Progressing   Problem: Health Behavior/Discharge Planning: Goal: Ability to manage health-related needs will improve Outcome: Progressing   Problem: Clinical Measurements: Goal: Ability to maintain clinical measurements within normal limits will improve Outcome: Progressing Goal: Will remain free from infection Outcome: Progressing Goal: Diagnostic test results will improve Outcome: Progressing Goal: Respiratory complications  will improve Outcome: Progressing Goal: Cardiovascular complication will be avoided Outcome: Progressing   Problem: Activity: Goal: Risk for activity intolerance will decrease Outcome: Progressing   Problem: Nutrition: Goal: Adequate nutrition will be maintained Outcome: Progressing   Problem: Coping: Goal: Level of anxiety will decrease Outcome: Progressing   Problem: Elimination: Goal: Will not experience complications related to bowel motility Outcome: Progressing Goal: Will not experience complications related to urinary retention Outcome: Progressing   Problem: Pain Management: Goal: General experience of comfort will improve Outcome: Progressing   Problem: Safety: Goal: Ability to remain free from injury will improve Outcome: Progressing   Problem: Skin Integrity: Goal: Risk for impaired skin integrity will decrease Outcome: Progressing   Problem: Education: Goal: Knowledge of disease or condition will improve Outcome: Progressing Goal: Knowledge of secondary prevention will improve (MUST DOCUMENT ALL) Outcome: Progressing Goal: Knowledge of patient specific risk factors will improve [name] N/A or DELETE if not current risk factor) Outcome: Progressing   Problem: Ischemic Stroke/TIA Tissue Perfusion: Goal: Complications of ischemic stroke/TIA will be minimized Outcome: Progressing   Problem: Coping: Goal: Will verbalize positive feelings about self Outcome: Progressing Goal: Will identify appropriate support needs Outcome: Progressing   Problem: Health Behavior/Discharge Planning: Goal: Ability to manage health-related needs will improve Outcome: Progressing Goal: Goals will be collaboratively established with patient/family Outcome: Progressing   Problem: Self-Care: Goal: Ability to participate in self-care as condition permits will improve Outcome: Progressing Goal: Verbalization of feelings and concerns over difficulty with self-care will  improve Outcome: Progressing Goal: Ability to communicate needs accurately will improve Outcome: Progressing   Problem: Nutrition: Goal: Risk of aspiration will decrease Outcome: Progressing Goal: Dietary intake will improve Outcome: Progressing

## 2023-03-03 ENCOUNTER — Telehealth: Payer: Self-pay | Admitting: Hematology and Oncology

## 2023-03-03 ENCOUNTER — Other Ambulatory Visit (HOSPITAL_COMMUNITY): Payer: Self-pay

## 2023-03-03 DIAGNOSIS — J690 Pneumonitis due to inhalation of food and vomit: Secondary | ICD-10-CM

## 2023-03-03 DIAGNOSIS — R410 Disorientation, unspecified: Secondary | ICD-10-CM

## 2023-03-03 LAB — COMPREHENSIVE METABOLIC PANEL
ALT: 33 U/L (ref 0–44)
AST: 22 U/L (ref 15–41)
Albumin: 2.2 g/dL — ABNORMAL LOW (ref 3.5–5.0)
Alkaline Phosphatase: 152 U/L — ABNORMAL HIGH (ref 38–126)
Anion gap: 7 (ref 5–15)
BUN: 9 mg/dL (ref 6–20)
CO2: 23 mmol/L (ref 22–32)
Calcium: 8.6 mg/dL — ABNORMAL LOW (ref 8.9–10.3)
Chloride: 107 mmol/L (ref 98–111)
Creatinine, Ser: 0.75 mg/dL (ref 0.61–1.24)
GFR, Estimated: >60 mL/min (ref >60)
Glucose, Bld: 102 mg/dL — ABNORMAL HIGH (ref 70–99)
Potassium: 3.9 mmol/L (ref 3.5–5.1)
Sodium: 137 mmol/L (ref 135–145)
Total Bilirubin: 0.3 mg/dL (ref <1.2)
Total Protein: 7.4 g/dL (ref 6.5–8.1)

## 2023-03-03 LAB — CBC WITH DIFFERENTIAL/PLATELET
Abs Immature Granulocytes: 0.14 10*3/uL — ABNORMAL HIGH (ref 0.00–0.07)
Basophils Absolute: 0 10*3/uL (ref 0.0–0.1)
Basophils Relative: 0 %
Eosinophils Absolute: 0.7 10*3/uL — ABNORMAL HIGH (ref 0.0–0.5)
Eosinophils Relative: 5 %
HCT: 23.8 % — ABNORMAL LOW (ref 39.0–52.0)
Hemoglobin: 8.2 g/dL — ABNORMAL LOW (ref 13.0–17.0)
Immature Granulocytes: 1 %
Lymphocytes Relative: 24 %
Lymphs Abs: 3.1 10*3/uL (ref 0.7–4.0)
MCH: 26.2 pg (ref 26.0–34.0)
MCHC: 34.5 g/dL (ref 30.0–36.0)
MCV: 76 fL — ABNORMAL LOW (ref 80.0–100.0)
Monocytes Absolute: 1.5 10*3/uL — ABNORMAL HIGH (ref 0.1–1.0)
Monocytes Relative: 12 %
Neutro Abs: 7.6 10*3/uL (ref 1.7–7.7)
Neutrophils Relative %: 58 %
Platelets: 1162 10*3/uL (ref 150–400)
RBC: 3.13 MIL/uL — ABNORMAL LOW (ref 4.22–5.81)
RDW: 17.1 % — ABNORMAL HIGH (ref 11.5–15.5)
Smear Review: INCREASED
WBC: 13.1 10*3/uL — ABNORMAL HIGH (ref 4.0–10.5)
nRBC: 2.8 % — ABNORMAL HIGH (ref 0.0–0.2)

## 2023-03-03 LAB — MAGNESIUM: Magnesium: 2.1 mg/dL (ref 1.7–2.4)

## 2023-03-03 LAB — PHOSPHORUS: Phosphorus: 3.7 mg/dL (ref 2.5–4.6)

## 2023-03-03 MED ORDER — HYDROCORTISONE 1 % EX CREA
1.0000 | TOPICAL_CREAM | Freq: Two times a day (BID) | CUTANEOUS | 0 refills | Status: AC
  Filled 2023-03-03: qty 28, 9d supply, fill #0

## 2023-03-03 NOTE — Progress Notes (Signed)
Alert and oriented x4, verbalized understanding of dc instructions. Toc meds given to patient.

## 2023-03-03 NOTE — Progress Notes (Signed)
Physical Therapy Treatment Patient Details Name: Peter Kline MRN: [id number] DOB: 11-Feb-1966 Today's Date: 03/03/2023   History of Present Illness Pt is a 57 y/o male presenting to the ED on 10/30 with AMS, found wandering the streets of [facility] confused with slurred speech.  Initially negative for stroke.  Mental status failed to improve, pt became more agitated and eventually was intubated for airway protection.  Chest xray showed multifocal PNA.  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    PT Comments  Pt greeted resting in bed, eager for mobility and demonstrating continued progress towards acute goals. Pt demonstrating increased tolerance for gait this session with supervision for safety, x1 mild LOB with environmental scanning with pt able to self correct. Pt with periods of quick gait speed, increasing instability, educated pt and pt spouse, Peter Kline, on importance of controled gait speed to reduce risk of falls with pt verbalizing understanding. Pt continues to benefit from skilled PT services to progress toward functional mobility goals.     If plan is discharge home, recommend the following: Assistance with cooking/housework;Direct supervision/assist for medications management;Direct supervision/assist for financial management;Assist for transportation;Help with stairs or ramp for entrance;A little help with walking and/or transfers;A little help with bathing/dressing/bathroom   Can travel by private vehicle        Equipment Recommendations  None recommended by PT    Recommendations for Other Services       Precautions / Restrictions Precautions Precautions: Fall Restrictions Weight Bearing  Restrictions: No     Mobility  Bed Mobility Overal bed mobility: Needs Assistance Bed Mobility: Supine to Sit, Sit to Supine     Supine to sit: Modified independent (Device/Increase time) Sit to supine: Modified independent (Device/Increase time)        Transfers Overall transfer level: Needs assistance Equipment used: None Transfers: Sit to/from Stand Sit to Stand: Supervision           General transfer comment: supervision for safety    Ambulation/Gait Ambulation/Gait assistance: Supervision, Contact guard assist Gait Distance (Feet): 500 Feet Assistive device: None Gait Pattern/deviations: Drifts right/left Gait velocity: normal     General Gait Details: pt needing cues for safe speed, difficulty with dual tasking, needing to stop to recieve directions, x1 LOB with head turns and environmental scanning able to self correct   Stairs             Wheelchair Mobility     Tilt Bed    Modified Rankin (Stroke Patients Only) Modified Rankin (Stroke Patients Only) Pre-Morbid Rankin Score: No symptoms Modified Rankin: Slight disability     Balance Overall balance assessment: Needs assistance Sitting-balance support: No upper extremity supported Sitting balance-Leahy Scale: Normal Sitting balance - Comments: can bend forward to doff and donn one sock and then cross one leg over the other to doff and donn the other sock without LOB either way   Standing balance support: No upper extremity supported Standing balance-Leahy Scale: Good Standing balance comment: stood and ambulated without AD and no LOB                            Cognition Arousal: Alert Behavior During Therapy: [facility] for tasks  assessed/performed Overall Cognitive Status: Impaired/Different from baseline                     Current Attention Level: Alternating   Following Commands: Follows multi-step commands consistently, Follows one step commands consistently        General Comments: Pt with improving safety awareness (had on socks without grips, sat to don shoes).        Exercises      General Comments General comments (skin integrity, edema, etc.): VSS on RA, spouse, Peter Kline present and supportive      Pertinent Vitals/Pain      Home Living                          Prior Function            PT Goals (current goals can now be found in the care plan section) Acute Rehab PT Goals Patient Stated Goal: return home PT Goal Formulation: With patient Time For Goal Achievement: 03/15/23 Progress towards PT goals: Progressing toward goals    Frequency    Min 1X/week      PT Plan      Co-evaluation              AM-PAC PT "6 Clicks" Mobility   Outcome Measure  Help needed turning from your back to your side while in a flat bed without using bedrails?: None Help needed moving from lying on your back to sitting on the side of a flat bed without using bedrails?: None Help needed moving to and from a bed to a chair (including a wheelchair)?: A Little Help needed standing up from a chair using your arms (e.g., wheelchair or bedside chair)?: A Little Help needed to walk in hospital room?: A Little Help needed climbing 3-5 steps with a railing? : A Little 6 Click Score: 20    End of Session Equipment Utilized During Treatment:  (declined gait belt) Activity Tolerance: Patient tolerated treatment well Patient left: in bed;with call bell/phone within reach Nurse Communication: Mobility status PT Visit Diagnosis: Other abnormalities of gait and mobility (R26.89);Muscle weakness (generalized) (M62.81);Other symptoms and signs involving the nervous system (R29.898)     Time: 8295-6213 PT Time Calculation (min) (ACUTE ONLY): 10 min  Charges:    $Gait Training: 8-22 mins PT General Charges $$ ACUTE PT VISIT: 1 Visit                     Peter Kline R. PTA Acute Rehabilitation Services Office: [phone]   Catalina Antigua 03/03/2023, 1:37 PM

## 2023-03-03 NOTE — Plan of Care (Signed)
  Problem: Education: Goal: Ability to describe self-care measures that may prevent or decrease complications (Diabetes Survival Skills Education) will improve Outcome: Progressing Goal: Individualized Educational Video(s) Outcome: Progressing   Problem: Coping: Goal: Ability to adjust to condition or change in health will improve Outcome: Progressing   Problem: Fluid Volume: Goal: Ability to maintain a balanced intake and output will improve Outcome: Progressing   Problem: Health Behavior/Discharge Planning: Goal: Ability to identify and utilize available resources and services will improve Outcome: Progressing Goal: Ability to manage health-related needs will improve Outcome: Progressing   Problem: Metabolic: Goal: Ability to maintain appropriate glucose levels will improve Outcome: Progressing   Problem: Nutritional: Goal: Maintenance of adequate nutrition will improve Outcome: Progressing Goal: Progress toward achieving an optimal weight will improve Outcome: Progressing   Problem: Skin Integrity: Goal: Risk for impaired skin integrity will decrease Outcome: Progressing   Problem: Tissue Perfusion: Goal: Adequacy of tissue perfusion will improve Outcome: Progressing   Problem: Safety: Goal: Non-violent Restraint(s) Outcome: Progressing   Problem: Education: Goal: Knowledge of General Education information will improve Description: Including pain rating scale, medication(s)/side effects and non-pharmacologic comfort measures Outcome: Progressing   Problem: Health Behavior/Discharge Planning: Goal: Ability to manage health-related needs will improve Outcome: Progressing   Problem: Clinical Measurements: Goal: Ability to maintain clinical measurements within normal limits will improve Outcome: Progressing Goal: Will remain free from infection Outcome: Progressing Goal: Diagnostic test results will improve Outcome: Progressing Goal: Respiratory complications  will improve Outcome: Progressing Goal: Cardiovascular complication will be avoided Outcome: Progressing   Problem: Activity: Goal: Risk for activity intolerance will decrease Outcome: Progressing   Problem: Nutrition: Goal: Adequate nutrition will be maintained Outcome: Progressing   Problem: Coping: Goal: Level of anxiety will decrease Outcome: Progressing   Problem: Elimination: Goal: Will not experience complications related to bowel motility Outcome: Progressing Goal: Will not experience complications related to urinary retention Outcome: Progressing   Problem: Pain Management: Goal: General experience of comfort will improve Outcome: Progressing   Problem: Safety: Goal: Ability to remain free from injury will improve Outcome: Progressing   Problem: Skin Integrity: Goal: Risk for impaired skin integrity will decrease Outcome: Progressing   Problem: Education: Goal: Knowledge of disease or condition will improve Outcome: Progressing Goal: Knowledge of secondary prevention will improve (MUST DOCUMENT ALL) Outcome: Progressing Goal: Knowledge of patient specific risk factors will improve Peter Kline N/A or DELETE if not current risk factor) Outcome: Progressing   Problem: Ischemic Stroke/TIA Tissue Perfusion: Goal: Complications of ischemic stroke/TIA will be minimized Outcome: Progressing   Problem: Coping: Goal: Will verbalize positive feelings about self Outcome: Progressing Goal: Will identify appropriate support needs Outcome: Progressing   Problem: Health Behavior/Discharge Planning: Goal: Ability to manage health-related needs will improve Outcome: Progressing Goal: Goals will be collaboratively established with patient/family Outcome: Progressing   Problem: Self-Care: Goal: Ability to participate in self-care as condition permits will improve Outcome: Progressing Goal: Verbalization of feelings and concerns over difficulty with self-care will  improve Outcome: Progressing Goal: Ability to communicate needs accurately will improve Outcome: Progressing   Problem: Nutrition: Goal: Risk of aspiration will decrease Outcome: Progressing Goal: Dietary intake will improve Outcome: Progressing

## 2023-03-03 NOTE — Progress Notes (Signed)
Speech Language Pathology Treatment: Dysphagia;Cognitive-Linquistic  Patient Details Name: Peter Kline MRN: [id number] DOB: 03/16/1966 Today's Date: 03/03/2023 Time: 5621-3086 SLP Time Calculation (min) (ACUTE ONLY): 18 min  Assessment / Plan / Recommendation Clinical Impression  Pt is discharging today and seen for cognition and dysphagia. He consumed approximately 3 oz of water without s/s aspiration and orally managed solid texture timely and efficiently. No complaints of dysphagia. Continue regular texture, thin liquids.   Discussed memory strategies with pt and he stated "writing things down". SLP also offered additional techniques such as making associations, repeating information, placing items in the same place. Also asked pt what he felt like he still needed to work on in regards to cognition and he stated "remembering things" and did not state additional deficits. Asked wife to have him make lists/grocery list, items needed and help with general recall around the house.  Pt's clock drawing has improved from initial assessment where he mostly just made scribbles on the paper (refer to initial eval for picture). He needed assist today with spacing the numbers and placing the hands for the correct time. He answered questions from a mock menu with 75% accuracy. Pt is going to outpatient therapy and would benefit from continued ST.    HPI HPI: Peter Kline is a 57 year old male with presented to the emergency department on 10/30 with altered mental status. Was reportedly found wandering streets of [employer] altered.  Pt with respiratory failure and intubated 10/30-11/6.  MRI 10/31 negative.  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 Pt with past medical history of sickle cell c/b priapism, GERD, chronic pain,  retinal hemorrhage, tobacco use, marijuana use, vitamin D deficiency      SLP Plan  Discharge SLP treatment due to (comment) (discharging hospital today)      Recommendations for follow up therapy are one component of a multi-disciplinary discharge planning process, led by the attending physician.  Recommendations may be updated based on patient status, additional functional criteria and insurance authorization.    Recommendations  Diet recommendations: Regular;Thin liquid Liquids provided via: Cup;Straw Medication Administration: Whole meds with liquid Supervision: Patient able to self feed Compensations: Slow rate;Small sips/bites Postural Changes and/or Swallow Maneuvers: Seated upright 90 degrees                  Oral care BID   Set up Supervision/Assistance Cognitive communication deficit (R41.841);Dysphagia, unspecified (R13.10)     Discharge SLP treatment due to (comment) (discharging hospital today)     Royce Macadamia  03/03/2023, 1:29 PM

## 2023-03-03 NOTE — Progress Notes (Signed)
PROGRESS NOTE    Peter Kline  ZOX:[id number] DOB: 14-Dec-1965 DOA: 02/11/2023 PCP: Quentin Angst, MD    Brief Narrative:  57 year old gentleman admitted 20 days ago with altered mental status requiring ICU admission for airway protection, found to have acute stroke, acute DVT, aspiration pneumonia.  Hospital course complicated with development of sepsis.  He was treated ICU then transferred to general medical floor.  He did good clinical recovery with gradual improvement now.  Remain in the hospital with hope to go to CIR, however he did good recovery and going home now.  Subjective: Patient seen and examined in the morning rounds.  Significant other at the bedside.  She asked to be discharged in the evening so she can make some arrangements at his house.  Agreed.  Stable for discharge. Assessment & Plan:    Acute metabolic encephalopathy, acute toxic encephalopathy Acute embolic stroke Aspiration pneumonia Acute respiratory failure with hypoxemia Bilateral lower extremity DVT Thrombocytosis, cause unknown.  Due to acute illness.  Will send hematology referral. Dysphagia   Patient adequately improved.  See discharge instructions and summary done by Dr. Lowell Guitar on 11/17.  Patient is moving around.  He can be discharged home.  He may benefit with outpatient therapies.   DVT prophylaxis: Place and maintain sequential compression device Start: 02/12/23 1014 SCDs Start: 02/12/23 0543 apixaban (ELIQUIS) tablet 5 mg   Code Status: Full code Family Communication: Significant other at the bedside Disposition Plan: Status is: Inpatient Remains inpatient appropriate because: Discharging today     Consultants:  Neurology  Procedures:  None  Antimicrobials:  Augmentin     Objective: Vitals:   03/03/23 0035 03/03/23 0415 03/03/23 0815 03/03/23 1203  BP: 118/81 113/80 115/83 121/61  Pulse: 73 76 71 (!) 103  Resp: 18 18 18 18   Temp: 98 F (36.7 C) 97.8 F (36.6 C)  98.7 F (37.1 C) 98.8 F (37.1 C)  TempSrc:   Oral Oral  SpO2: 99% 100% 98% 99%  Weight:      Height:        Intake/Output Summary (Last 24 hours) at 03/03/2023 1429 Last data filed at 03/03/2023 0500 Gross per 24 hour  Intake 200 ml  Output 600 ml  Net -400 ml   Filed Weights   02/28/23 0500 03/01/23 0500 03/02/23 0500  Weight: 79 kg 79 kg 80.1 kg    Examination:  Appears very comfortable. Able to walk around in the room.  Alert awake and oriented.  On room air.    Data Reviewed: I have personally reviewed following labs and imaging studies  CBC: Recent Labs  Lab 02/28/23 1100 03/01/23 0422 03/01/23 1633 03/02/23 1248 03/03/23 0508  WBC 16.2* 14.5*  15.1* 15.8* 13.9* 13.1*  NEUTROABS 12.3* 9.8* 12.2* 9.2* 7.6  HGB 8.4* 8.0*  8.0* 8.8* 8.2* 8.2*  HCT 23.9* 22.7*  23.2* 24.9* 22.9* 23.8*  MCV 74.9* 74.9*  74.8* 74.6* 75.3* 76.0*  PLT 671* 995*  958* 1,093* 1,069* 1,162*   Basic Metabolic Panel: Recent Labs  Lab 02/25/23 0439 02/27/23 0859 02/28/23 1100 03/01/23 0422 03/03/23 0508  NA 138 136 138 137 137  K 3.9 4.3 4.6 3.9 3.9  CL 106 103 107 106 107  CO2 22 21* 20* 22 23  GLUCOSE 129* 119* 104* 98 102*  BUN 17 16 13 12 9   CREATININE 0.77 1.09 1.13 0.79 0.75  CALCIUM 8.2* 8.5* 8.1* 8.6* 8.6*  MG  --  2.3 2.3 2.2 2.1  PHOS  --  3.3 2.9 3.5 3.7   GFR: Estimated Creatinine Clearance: 110.5 mL/min (by C-G formula based on SCr of 0.75 mg/dL). Liver Function Tests: Recent Labs  Lab 02/27/23 0859 02/28/23 1100 03/03/23 0508  AST 36 37 22  ALT 61* 49* 33  ALKPHOS 195* 183* 152*  BILITOT 0.8 0.9 0.3  PROT 7.8 7.4 7.4  ALBUMIN 2.1* 2.2* 2.2*   No results for input(s): "LIPASE", "AMYLASE" in the last 168 hours. No results for input(s): "AMMONIA" in the last 168 hours. Coagulation Profile: No results for input(s): "INR", "PROTIME" in the last 168 hours. Cardiac Enzymes: No results for input(s): "CKTOTAL", "CKMB", "CKMBINDEX", "TROPONINI" in  the last 168 hours. BNP (last 3 results) No results for input(s): "PROBNP" in the last 8760 hours. HbA1C: No results for input(s): "HGBA1C" in the last 72 hours. CBG: Recent Labs  Lab 03/01/23 0328 03/01/23 0815 03/02/23 0747 03/02/23 1301 03/02/23 1633  GLUCAP 107* 100* 121* 108* 110*   Lipid Profile: No results for input(s): "CHOL", "HDL", "LDLCALC", "TRIG", "CHOLHDL", "LDLDIRECT" in the last 72 hours. Thyroid Function Tests: No results for input(s): "TSH", "T4TOTAL", "FREET4", "T3FREE", "THYROIDAB" in the last 72 hours. Anemia Panel: Recent Labs    03/01/23 1633  FERRITIN 1,517*   Sepsis Labs: No results for input(s): "PROCALCITON", "LATICACIDVEN" in the last 168 hours.  No results found for this or any previous visit (from the past 240 hour(s)).       Radiology Studies: No results found.      Scheduled Meds:  apixaban  5 mg Oral BID   busPIRone  10 mg Oral TID   feeding supplement  237 mL Oral BID BM   folic acid  1 mg Oral Daily   multivitamin with minerals  1 tablet Oral Daily   nicotine  14 mg Transdermal Daily   mouth rinse  15 mL Mouth Rinse 4 times per day   sodium chloride flush  10 mL Intravenous Q12H   thiamine  100 mg Oral Daily   Continuous Infusions:  ampicillin-sulbactam (UNASYN) IV 3 g (03/03/23 0848)     LOS: 19 days    Time spent: 25 minutes    Dorcas Carrow, MD [facility]

## 2023-03-03 NOTE — TOC Transition Note (Signed)
Transition of Care [facility]) - CM/SW Discharge Note   Patient Details  Name: Peter Kline MRN: [id number] Date of Birth: 25-Mar-1966  Transition of Care [facility]) CM/SW Contact:  [name], LCSW Phone Number: 03/03/2023, 11:27 AM   Clinical Narrative:   CSW noting patient improvements over the weekend, now able to go home. CSW met with patient with significant other at bedside to discuss discharge plan. Patient will go home, plans to return to his apartment with significant other. Patient in agreement with outpatient therapy, can get transportation through sickle cell clinic for appointments at neurorehab. CSW sent referrals and told patient to call and schedule appointments, patient indicated understanding. No further TOC needs at this time.    Final next level of care: Home/Self Care Barriers to Discharge: Barriers Resolved   Patient Goals and CMS Choice      Discharge Placement                         Discharge Plan and Services Additional resources added to the After Visit Summary for                                       Social Determinants of Health (SDOH) Interventions SDOH Screenings   Food Insecurity: No Food Insecurity (02/25/2023)  Housing: Patient Declined (02/25/2023)  Transportation Needs: No Transportation Needs (02/25/2023)  Utilities: Not At Risk (02/25/2023)  Depression (PHQ2-9): Low Risk  (11/25/2018)  Financial Resource Strain: Low Risk  (11/15/2021)   Received from [facility]  Tobacco Use: High Risk (02/11/2023)     Readmission Risk Interventions     No data to display

## 2023-03-03 NOTE — Progress Notes (Signed)
Occupational Therapy Treatment Patient Details Name: Peter Kline MRN: [id number] DOB: 04-Apr-1966 Today's Date: 03/03/2023   History of present illness Pt is a 57 y/o male presenting to the ED on 10/30 with AMS, found wandering the streets of [place] confused with slurred speech.  Initially negative for stroke.  Mental status failed to improve, pt became more agitated and eventually was intubated for airway protection.  Chest xray showed multifocal PNA.  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   OT comments  Pt progressing towards goals, now performing transfers and in room ambulation with supervision, without AD. Pt needing supervision for ADLs, and mod I for bed mobility. Still with noted cognitive deficits in memory and processing, educated pt on having assist for med mgmt at home and pt verbalized understanding. Pt presenting with impairments listed below, will follow acutely. Recommend OP OT at d/c.       If plan is discharge home, recommend the following:  A little help with walking and/or transfers;A little help with bathing/dressing/bathroom;Assist for transportation;Assistance with cooking/housework;Help with stairs or ramp for entrance;Direct supervision/assist for financial management;Direct supervision/assist for medications management   Equipment Recommendations  None recommended by OT    Recommendations for Other Services PT consult    Precautions / Restrictions Precautions Precautions: Fall Restrictions Weight Bearing Restrictions: No       Mobility Bed Mobility Overal bed mobility: Modified Independent                  Transfers Overall transfer level: Needs assistance Equipment  used: None Transfers: Sit to/from Stand Sit to Stand: Supervision                 Balance Overall balance assessment: Needs assistance Sitting-balance support: No upper extremity supported Sitting balance-Leahy Scale: Normal Sitting balance - Comments: can bend forward to doff and donn one sock and then cross one leg over the other to doff and donn the other sock without LOB either way   Standing balance support: No upper extremity supported Standing balance-Leahy Scale: Good Standing balance comment: able to pick item up off of ground                           ADL either performed or assessed with clinical judgement   ADL Overall ADL's : Needs assistance/impaired     Grooming: Wash/dry face;Supervision/safety               Lower Body Dressing: Supervision/safety;Sitting/lateral leans   Toilet Transfer: Supervision/safety;Ambulation           Functional mobility during ADLs: Supervision/safety      Extremity/Trunk Assessment Upper Extremity Assessment Upper Extremity Assessment: Overall WFL for tasks assessed   Lower Extremity Assessment Lower Extremity Assessment: Defer to PT evaluation        Vision       Perception Perception Perception: Not tested   Praxis Praxis Praxis: Not tested    Cognition Arousal: Alert Behavior During Therapy: [place] for tasks assessed/performed Overall Cognitive Status: Impaired/Different from baseline Area of Impairment: Memory                   Current Attention Level: Alternating Memory: Decreased  short-term memory Following Commands: Follows multi-step commands consistently, Follows one step commands consistently Safety/Judgement: Decreased awareness of deficits     General Comments: decr STM when given 3 word recall, able to recall 1/3 words, able to count backwards 20-1 but needs noted 2 errors when stating months of year backwards        Exercises      Shoulder Instructions        General Comments VSS on RA    Pertinent Vitals/ Pain       Pain Assessment Pain Assessment: No/denies pain  Home Living                                          Prior Functioning/Environment              Frequency  Min 1X/week        Progress Toward Goals  OT Goals(current goals can now be found in the care plan section)  Progress towards OT goals: Progressing toward goals  ADL Goals Pt Will Perform Grooming: with supervision;sitting Additional ADL Goal #1: Pt will be min A to roll right and left in bed to A with basic ADLs Additional ADL Goal #2: Pt will follow 3 out of 5 one step commands Additional ADL Goal #3: Pt will be able to sit EOB without more than Min A for balance  Plan      Co-evaluation                 AM-PAC OT "6 Clicks" Daily Activity     Outcome Measure   Help from another person eating meals?: None Help from another person taking care of personal grooming?: A Little Help from another person toileting, which includes using toliet, bedpan, or urinal?: A Little Help from another person bathing (including washing, rinsing, drying)?: A Little Help from another person to put on and taking off regular upper body clothing?: A Little Help from another person to put on and taking off regular lower body clothing?: A Little 6 Click Score: 19    End of Session Equipment Utilized During Treatment: Gait belt;Rolling walker (2 wheels)  OT Visit Diagnosis: Unsteadiness on feet (R26.81);Other abnormalities of gait and mobility (R26.89);Other symptoms and signs involving cognitive function   Activity Tolerance Patient tolerated treatment well   Patient Left in bed;with call bell/phone within reach;with bed alarm set   Nurse Communication Mobility status        Time: 1610-9604 OT Time Calculation (min): 19 min  Charges: OT General Charges $OT Visit: 1 Visit OT Treatments $Self Care/Home Management : 8-22 mins  [name],  OTD, OTR/L [facility] [phone]   [name] 03/03/2023, 2:31 PM

## 2023-03-03 NOTE — Telephone Encounter (Signed)
Called patient unable to leave vm needed access code

## 2023-03-07 ENCOUNTER — Telehealth: Payer: Self-pay | Admitting: Internal Medicine

## 2023-03-07 NOTE — Telephone Encounter (Signed)
Tried to call pt to schedule him with either Mitzi Davenport or Archie Patten - was unable to reach pt or leave a voicemail

## 2023-03-19 ENCOUNTER — Telehealth: Payer: Self-pay | Admitting: Internal Medicine

## 2023-03-19 NOTE — Telephone Encounter (Signed)
Called pt. He is scheduled this Friday 03/21/23 to see Mitzi Davenport for a hospital follow up   Copied from CRM [id number]. Topic: Appointments - Appointment Scheduling >> Mar 19, 2023  2:19 PM Lovey Newcomer R wrote: Patient/patient representative is calling to schedule an appointment.Its shows that his PCP is Dr. Hyman Hopes, but I am unable to schedule. The doctor is not showing up in [employer] It. This Pt just got out the hospital suffering from a stroke and pneumonia and this would be a New Pt visit. Please f/u to assist with an appt. Cb# [phone]

## 2023-03-21 ENCOUNTER — Encounter (HOSPITAL_COMMUNITY): Payer: Self-pay | Admitting: Nurse Practitioner

## 2023-03-21 ENCOUNTER — Encounter: Payer: Self-pay | Admitting: Nurse Practitioner

## 2023-03-21 ENCOUNTER — Ambulatory Visit (INDEPENDENT_AMBULATORY_CARE_PROVIDER_SITE_OTHER): Payer: 59 | Admitting: Nurse Practitioner

## 2023-03-21 VITALS — BP 120/82 | HR 88 | Temp 97.0°F | Wt 176.2 lb

## 2023-03-21 DIAGNOSIS — H3560 Retinal hemorrhage, unspecified eye: Secondary | ICD-10-CM | POA: Insufficient documentation

## 2023-03-21 DIAGNOSIS — Z09 Encounter for follow-up examination after completed treatment for conditions other than malignant neoplasm: Secondary | ICD-10-CM | POA: Insufficient documentation

## 2023-03-21 DIAGNOSIS — J690 Pneumonitis due to inhalation of food and vomit: Secondary | ICD-10-CM | POA: Diagnosis not present

## 2023-03-21 DIAGNOSIS — R591 Generalized enlarged lymph nodes: Secondary | ICD-10-CM | POA: Insufficient documentation

## 2023-03-21 DIAGNOSIS — D75838 Other thrombocytosis: Secondary | ICD-10-CM

## 2023-03-21 DIAGNOSIS — R4182 Altered mental status, unspecified: Secondary | ICD-10-CM

## 2023-03-21 DIAGNOSIS — F419 Anxiety disorder, unspecified: Secondary | ICD-10-CM | POA: Insufficient documentation

## 2023-03-21 DIAGNOSIS — I824Z3 Acute embolism and thrombosis of unspecified deep veins of distal lower extremity, bilateral: Secondary | ICD-10-CM | POA: Insufficient documentation

## 2023-03-21 DIAGNOSIS — I6300 Cerebral infarction due to thrombosis of unspecified precerebral artery: Secondary | ICD-10-CM | POA: Diagnosis not present

## 2023-03-21 DIAGNOSIS — G894 Chronic pain syndrome: Secondary | ICD-10-CM | POA: Insufficient documentation

## 2023-03-21 DIAGNOSIS — D5720 Sickle-cell/Hb-C disease without crisis: Secondary | ICD-10-CM

## 2023-03-21 HISTORY — DX: Retinal hemorrhage, unspecified eye: H35.60

## 2023-03-21 HISTORY — DX: Anxiety disorder, unspecified: F41.9

## 2023-03-21 MED ORDER — OXYCODONE HCL 5 MG PO CAPS: 5.0000 mg | ORAL_CAPSULE | Freq: Four times a day (QID) | ORAL | 0 refills | Status: DC | PRN

## 2023-03-21 MED ORDER — NALOXONE HCL 4 MG/0.1ML NA LIQD: NASAL | 1 refills | Status: AC

## 2023-03-21 MED ORDER — BUSPIRONE HCL 5 MG PO TABS: 5.0000 mg | ORAL_TABLET | Freq: Two times a day (BID) | ORAL | 1 refills | Status: DC

## 2023-03-21 NOTE — Assessment & Plan Note (Signed)
Lab Results  Component Value Date   WBC 13.1 (H) 03/03/2023   HGB 8.2 (L) 03/03/2023   HCT 23.8 (L) 03/03/2023   MCV 76.0 (L) 03/03/2023   PLT 1,162 (HH) 03/03/2023  Patient encouraged to keep upcoming appointment with hematology

## 2023-03-21 NOTE — Assessment & Plan Note (Signed)
Reports taking buspirone while at the hospital not sure of the dosage - busPIRone (BUSPAR) 5 MG tablet; Take 1 tablet (5 mg total) by mouth 2 (two) times daily.  Dispense: 60 tablet; Refill: 1 Follow-up in 4 weeks

## 2023-03-21 NOTE — Assessment & Plan Note (Signed)
-   [id number] 11+Oxyco+Alc+Crt-Bund - oxycodone (OXY-IR) 5 MG capsule; Take 1 capsule (5 mg total) by mouth every 6 (six) hours as needed for up to 15 days.  Dispense: 60 capsule; Refill: 0 - naloxone (NARCAN) nasal spray 4 mg/0.1 mL; Place 1 spray (4 mg total) into one nostril once as needed for overdose  Dispense: 1 each; Refill: 1  - naloxone (NARCAN) nasal spray 4 mg/0.1 mL; Place 1 spray (4 mg total) into one nostril once as needed for overdose  Dispense: 1 each; Refill: 1  Reviewed PDMP substance reporting system prior to prescribing opiate medications. No inconsistencies noted.

## 2023-03-21 NOTE — Assessment & Plan Note (Signed)
Sickle cell disease - Continue folic acid 1 mg daily We discussed the need for good hydration, monitoring of hydration status, avoidance of heat, cold, stress, and infection triggers.. The patient was reminded of the need to seek medical attention of any symptoms of bleeding, anemia, or infection.    Pulmonary evaluation -currently denies shortness of breath, cough, wheezing   Cardiac -patient denies chest pain, shortness of breath  Eye - High risk of proliferative retinopathy.  We discussed the need for eye exam at next visit.   Acute and chronic painful episodes - We agreed on oxycodone 5 mg every 6 hours as needed for moderate pain, Tylenol 650 mg every 6 hours as needed for mild pain. We reviewed the terms of our pain agreement, including the need to keep medicines in a safe locked location away from children or pets, Reviewed [place] Substance Reporting system prior to prescribing opiate medication, no inconsistencies noted.  Encouraged to take medication only as prescribed to avoid overdose.

## 2023-03-21 NOTE — Assessment & Plan Note (Signed)
Hospital chart reviewed, including discharge summary Medications reconciled and reviewed with the patient in detail 

## 2023-03-21 NOTE — Assessment & Plan Note (Addendum)
Continue eliquis 5mg  BID Patient encouraged to call neurology office to schedule an appointment Referral for PT and OT placed Lab Results  Component Value Date   CHOL 111 02/17/2023   HDL 27 (L) 02/17/2023   LDLCALC 49 02/17/2023   TRIG 102 02/22/2023   CHOLHDL 4.1 02/17/2023

## 2023-03-21 NOTE — Patient Instructions (Signed)
[facility] Follow up.   Why: call for a follow up appointment Contact information: [address]     [address] [place] [address] [phone]   1. Sickle cell-hemoglobin C disease without crisis (HCC)  - CBC - CMP14+EGFR - [id number] 11+Oxyco+Alc+Crt-Bund - oxycodone (OXY-IR) 5 MG capsule; Take 1 capsule (5 mg total) by mouth every 6 (six) hours as needed for up to 15 days.  Dispense: 60 capsule; Refill: 0  2. Aspiration pneumonia of upper lobe, unspecified aspiration pneumonia type, unspecified laterality (HCC)  - DG Chest 2 View  3. Altered mental status, unspecified altered mental status type   4. Cerebrovascular accident (CVA) due to thrombosis of precerebral artery (HCC)  - Ambulatory referral to Physical Therapy  It is important that you exercise regularly at least 30 minutes 5 times a week as tolerated  Think about what you will eat, plan ahead. Choose " clean, green, fresh or frozen" over canned, processed or packaged foods which are more sugary, salty and fatty. 70 to 75% of food eaten should be vegetables and fruit. Three meals at set times with snacks allowed between meals, but they must be fruit or vegetables. Aim to eat over a 12 hour period , example 7 am to 7 pm, and STOP after  your last meal of the day. Drink water,generally about 64 ounces per day, no other drink is as healthy. Fruit juice is best enjoyed in a healthy way, by EATING the fruit.  Thanks for choosing [facility] we consider it a privelige to serve you.

## 2023-03-21 NOTE — Assessment & Plan Note (Signed)
Now resolved.  

## 2023-03-21 NOTE — Assessment & Plan Note (Signed)
Continue eliquis 5 mg BID  Follow up with hematology as planned

## 2023-03-21 NOTE — Assessment & Plan Note (Addendum)
MPRESSION: 1. A 1.6 cm lymph node at the posterior left neck, just posterior to the sternocleidomastoid. This is nonspecific, but may account for the palpable abnormality. 2. No abscess or drainable fluid collection. 3. Multifocal ground-glass opacity in the upper lobes, left worse than right, concerning for aspiration/infection.     Will follow-up on this at next visit

## 2023-03-21 NOTE — Assessment & Plan Note (Signed)
Has completed full course of antibiotics ordered at discharge Follow-up chest x-ray ordered

## 2023-03-21 NOTE — Progress Notes (Addendum)
New Patient Office Visit  Subjective:  Patient ID: Peter Kline, male    DOB: Jan 10, 1966  Age: 57 y.o. MRN: [id number]  CC:  Chief Complaint  Patient presents with   Hospitalization Follow-up    HPI Peter Kline is a 57 y.o. male  has a past medical history of Anxiety (03/21/2023), Cerebrovascular accident (CVA) due to thrombosis of precerebral artery (HCC) (02/18/2023), Community acquired pneumonia (02/12/2023), Paradoxical embolism (HCC) (02/20/2023), 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 (09/30/2012), Retinal hemorrhage (03/21/2023), Shortness of breath, Sickle cell anemia (HCC), Tobacco dependence (01/24/2014), and Vitamin D deficiency (05/03/2014).  Patient presents for hospital discharge follow-up and to establish care for his chronic medical conditions  Patient was on admission at the hospital from 02/11/2023 to 03/02/2023 for altered mental status, CVA due to thrombosis of precerebral artery, community-acquired pneumonia, paradoxical embolism, acute DVT. Patient did require ICU admission for airway protection while in the hospital, there was a recommendation for discharge to CIR but therapy changed recommendation to outpatient PT OT.  Patient stated that he is currently living with his friend.  Sickle cell disease.  Currently on folic acid 1 mg daily, he was on oxycodone but has been off of the medication for over a year, stated that he stopped taking the medication due to cost, has been taking Tylenol at home and stated that Tylenol does not help his pain.  He currently has aching pain in his joints rated 9/10.  He denies fever, chills chest pain shortness of breath.  Establish with hematologist at [facility] last visit was in August 2023, states that he has an upcoming appointment with them.  Acute DVT.  Taking Eliquis 5 mg twice daily, has upcoming appointment with hematology at home.  Patient denies abnormal bleeding.  Aspiration pneumonia.  Patient has completed full course  of antibiotics ordered at discharge.  He currently denies cough, shortness of breath, wheezing.  There is a recommendation to do a repeat chest x-ray at discharge  Tobacco use disorder.  Patient stated that he quit smoking cigarettes about 4 months ago.  He was encouraged to continue to abstain from smoking  Marijuana use disorder.  Stated that he quit smoking marijuana about 4 months ago, he was encouraged to continue to abstain from use of marijuana      Past Medical History:  Diagnosis Date   Anxiety 03/21/2023   Cerebrovascular accident (CVA) due to thrombosis of precerebral artery (HCC) 02/18/2023   Community acquired pneumonia 02/12/2023   Paradoxical embolism (HCC) 02/20/2023   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 09/30/2012   Retinal hemorrhage 03/21/2023   Shortness of breath    Sickle cell anemia (HCC)    Tobacco dependence 01/24/2014   Vitamin D deficiency 05/03/2014    Past Surgical History:  Procedure Laterality Date   LAPAROSCOPIC GASTROTOMY W/ REPAIR OF ULCER      Family History  Problem Relation Age of Onset   Diabetes Mother    Hypertension Mother     Social History   Socioeconomic History   Marital status: Single    Spouse name: Not on file   Number of children: Not on file   Years of education: Not on file   Highest education level: Not on file  Occupational History   Not on file  Tobacco Use   Smoking status: Former    Current packs/day: 1.00    Types: Cigarettes   Smokeless tobacco: Never  Vaping Use   Vaping status: Never Used  Substance  and Sexual Activity   Alcohol use: Yes    Alcohol/week: 6.0 standard drinks of alcohol    Types: 6 Cans of beer per week    Comment: most days   Drug use: Yes    Types: Marijuana    Comment: occ   Sexual activity: Yes  Other Topics Concern   Not on file  Social History Narrative   Living with a friend   Social Determinants of Health   Financial Resource Strain: Low Risk  (11/15/2021)    Received from [facility]   Overall Financial Resource Strain (CARDIA)    Difficulty of Paying Living Expenses: Not hard at all  Food Insecurity: No Food Insecurity (02/25/2023)   Hunger Vital Sign    Worried About Running Out of Food in the Last Year: Never true    Ran Out of Food in the Last Year: Never true  Transportation Needs: No Transportation Needs (02/25/2023)   PRAPARE - Administrator, Civil Service (Medical): No    Lack of Transportation (Non-Medical): No  Physical Activity: Not on file  Stress: Not on file  Social Connections: Not on file  Intimate Partner Violence: Not At Risk (02/25/2023)   Humiliation, Afraid, Rape, and Kick questionnaire    Fear of Current or Ex-Partner: No    Emotionally Abused: No    Physically Abused: No    Sexually Abused: No    ROS Review of Systems  Constitutional:  Negative for appetite change, chills, fatigue and fever.  HENT:  Negative for congestion, postnasal drip, rhinorrhea and sneezing.   Respiratory:  Negative for cough, shortness of breath and wheezing.   Cardiovascular:  Negative for chest pain, palpitations and leg swelling.  Gastrointestinal:  Negative for abdominal pain, constipation, nausea and vomiting.  Genitourinary:  Negative for difficulty urinating, dysuria, flank pain and frequency.  Musculoskeletal:  Positive for arthralgias. Negative for back pain, joint swelling and myalgias.  Skin:  Negative for color change, pallor, rash and wound.  Neurological:  Negative for dizziness, facial asymmetry, weakness, numbness and headaches.  Psychiatric/Behavioral:  Negative for behavioral problems, confusion, self-injury and suicidal ideas.     Objective:   Today's Vitals: BP 120/82   Pulse 88   Temp (!) 97 F (36.1 C)   Wt 176 lb 3.2 oz (79.9 kg)   SpO2 98%   BMI 23.89 kg/m   Physical Exam Vitals reviewed.  Constitutional:      General: He is not in acute distress.    Appearance: Normal  appearance. He is not ill-appearing, toxic-appearing or diaphoretic.  Eyes:     General:        Right eye: No discharge.        Left eye: No discharge.     Extraocular Movements: Extraocular movements intact.  Cardiovascular:     Rate and Rhythm: Normal rate and regular rhythm.     Pulses: Normal pulses.     Heart sounds: Normal heart sounds. No murmur heard. Pulmonary:     Effort: Pulmonary effort is normal. No respiratory distress.     Breath sounds: Normal breath sounds. No stridor. No wheezing, rhonchi or rales.  Chest:     Chest wall: No tenderness.  Abdominal:     General: There is no distension.     Palpations: Abdomen is soft.     Tenderness: There is no abdominal tenderness. There is no guarding.  Musculoskeletal:        General: No tenderness.  Cervical back: Normal range of motion and neck supple. No rigidity or tenderness.     Right lower leg: No edema.     Left lower leg: No edema.  Lymphadenopathy:     Cervical: No cervical adenopathy.  Skin:    General: Skin is warm and dry.     Capillary Refill: Capillary refill takes less than 2 seconds.  Neurological:     Mental Status: He is alert and oriented to person, place, and time.     Motor: No weakness.     Coordination: Coordination normal.     Gait: Gait normal.  Psychiatric:        Mood and Affect: Mood normal.        Behavior: Behavior normal.        Thought Content: Thought content normal.        Judgment: Judgment normal.     Assessment & Plan:   Problem List Items Addressed This Visit       Cardiovascular and Mediastinum   Cerebrovascular accident (CVA) due to thrombosis of precerebral artery (HCC)    Continue eliquis 5mg  BID Patient encouraged to call neurology office to schedule an appointment Referral for PT and OT placed Lab Results  Component Value Date   CHOL 111 02/17/2023   HDL 27 (L) 02/17/2023   LDLCALC 49 02/17/2023   TRIG 102 02/22/2023   CHOLHDL 4.1 02/17/2023          Relevant Orders   Ambulatory referral to Physical Therapy   Ambulatory referral to Occupational Therapy   Ambulatory referral to Neurology   DVT, lower extremity, distal, acute, bilateral (HCC)    Continue eliquis 5 mg BID  Follow up with hematology as planned        Respiratory   Aspiration pneumonia of both lungs (HCC)    Has completed full course of antibiotics ordered at discharge Follow-up chest x-ray ordered      Relevant Medications   albuterol (VENTOLIN HFA) 108 (90 Base) MCG/ACT inhaler     Immune and Lymphatic   Lymphadenopathy    MPRESSION: 1. A 1.6 cm lymph node at the posterior left neck, just posterior to the sternocleidomastoid. This is nonspecific, but may account for the palpable abnormality. 2. No abscess or drainable fluid collection. 3. Multifocal ground-glass opacity in the upper lobes, left worse than right, concerning for aspiration/infection.     Will follow-up on this at next visit        Hematopoietic and Hemostatic   Reactive thrombocytosis    Lab Results  Component Value Date   WBC 13.1 (H) 03/03/2023   HGB 8.2 (L) 03/03/2023   HCT 23.8 (L) 03/03/2023   MCV 76.0 (L) 03/03/2023   PLT 1,162 (HH) 03/03/2023  Patient encouraged to keep upcoming appointment with hematology        Other   Sickle cell disease, type [place] (HCC)    Sickle cell disease - Continue folic acid 1 mg daily We discussed the need for good hydration, monitoring of hydration status, avoidance of heat, cold, stress, and infection triggers.. The patient was reminded of the need to seek medical attention of any symptoms of bleeding, anemia, or infection.    Pulmonary evaluation -currently denies shortness of breath, cough, wheezing   Cardiac -patient denies chest pain, shortness of breath  Eye - High risk of proliferative retinopathy.  We discussed the need for eye exam at next visit.   Acute and chronic painful episodes - We agreed on oxycodone  5 mg every 6 hours as  needed for moderate pain, Tylenol 650 mg every 6 hours as needed for mild pain. We reviewed the terms of our pain agreement, including the need to keep medicines in a safe locked location away from children or pets, Reviewed [place] Substance Reporting system prior to prescribing opiate medication, no inconsistencies noted.  Encouraged to take medication only as prescribed to avoid overdose.         Relevant Medications   oxycodone (OXY-IR) 5 MG capsule   naloxone (NARCAN) nasal spray 4 mg/0.1 mL   Other Relevant Orders   CBC   CMP14+EGFR   [id number] 11+Oxyco+Alc+Crt-Bund   AMS (altered mental status) - Primary    Now resolved      Hospital discharge follow-up    Hospital chart reviewed, including discharge summary Medications reconciled and reviewed with the patient in detail       Anxiety    Reports taking buspirone while at the hospital not sure of the dosage - busPIRone (BUSPAR) 5 MG tablet; Take 1 tablet (5 mg total) by mouth 2 (two) times daily.  Dispense: 60 tablet; Refill: 1 Follow-up in 4 weeks      Relevant Medications   busPIRone (BUSPAR) 5 MG tablet   Chronic pain syndrome     - [id number] 11+Oxyco+Alc+Crt-Bund - oxycodone (OXY-IR) 5 MG capsule; Take 1 capsule (5 mg total) by mouth every 6 (six) hours as needed for up to 15 days.  Dispense: 60 capsule; Refill: 0 - naloxone (NARCAN) nasal spray 4 mg/0.1 mL; Place 1 spray (4 mg total) into one nostril once as needed for overdose  Dispense: 1 each; Refill: 1  - naloxone (NARCAN) nasal spray 4 mg/0.1 mL; Place 1 spray (4 mg total) into one nostril once as needed for overdose  Dispense: 1 each; Refill: 1  Reviewed PDMP substance reporting system prior to prescribing opiate medications. No inconsistencies noted.         Relevant Medications   oxycodone (OXY-IR) 5 MG capsule   naloxone (NARCAN) nasal spray 4 mg/0.1 mL    Outpatient Encounter Medications as of 03/21/2023  Medication Sig   albuterol (VENTOLIN HFA) 108 (90 Base)  MCG/ACT inhaler Inhale into the lungs every 6 (six) hours as needed for wheezing or shortness of breath.   apixaban (ELIQUIS) 5 MG TABS tablet Take 1 tablet (5 mg total) by mouth 2 (two) times daily.   busPIRone (BUSPAR) 5 MG tablet Take 1 tablet (5 mg total) by mouth 2 (two) times daily.   folic acid (FOLVITE) 1 MG tablet Take 1 tablet (1 mg total) by mouth daily.   hydrocortisone cream 1 % Apply 1 Application topically 2 (two) times daily.   naloxone (NARCAN) nasal spray 4 mg/0.1 mL Place 1 spray (4 mg total) into one nostril once as needed for overdose   omeprazole (PRILOSEC) 20 MG capsule Take 20 mg by mouth daily.   oxycodone (OXY-IR) 5 MG capsule Take 1 capsule (5 mg total) by mouth every 6 (six) hours as needed for up to 15 days.   VITAMIN D PO Take by mouth.   [DISCONTINUED] busPIRone (BUSPAR) 10 MG tablet Take 10 mg by mouth 3 (three) times daily.   [DISCONTINUED] oxyCODONE-acetaminophen (PERCOCET) 10-325 MG tablet Take 1 tablet by mouth every 4 (four) hours as needed for pain.   No facility-administered encounter medications on file as of 03/21/2023.    Follow-up: Return in about 4 weeks (around 04/18/2023) for sicjle cell disaese and anxiety.  Donell Beers, FNP

## 2023-03-22 LAB — CMP14+EGFR
ALT: 23 [IU]/L (ref 0–44)
AST: 22 [IU]/L (ref 0–40)
Albumin: 3.9 g/dL (ref 3.8–4.9)
Alkaline Phosphatase: 131 [IU]/L — ABNORMAL HIGH (ref 44–121)
BUN/Creatinine Ratio: 18 (ref 9–20)
BUN: 17 mg/dL (ref 6–24)
Bilirubin Total: 0.7 mg/dL (ref 0.0–1.2)
CO2: 25 mmol/L (ref 20–29)
Calcium: 9.2 mg/dL (ref 8.7–10.2)
Chloride: 103 mmol/L (ref 96–106)
Creatinine, Ser: 0.95 mg/dL (ref 0.76–1.27)
Globulin, Total: 3.5 g/dL (ref 1.5–4.5)
Glucose: 90 mg/dL (ref 70–99)
Potassium: 4.2 mmol/L (ref 3.5–5.2)
Sodium: 142 mmol/L (ref 134–144)
Total Protein: 7.4 g/dL (ref 6.0–8.5)
eGFR: 93 mL/min/{1.73_m2} (ref >59)

## 2023-03-22 LAB — CBC
Hematocrit: 34.2 % — ABNORMAL LOW (ref 37.5–51.0)
Hemoglobin: 10.9 g/dL — ABNORMAL LOW (ref 13.0–17.7)
MCH: 27.7 pg (ref 26.6–33.0)
MCHC: 31.9 g/dL (ref 31.5–35.7)
MCV: 87 fL (ref 79–97)
NRBC: 1 % — ABNORMAL HIGH (ref 0–0)
Platelets: 138 10*3/uL — ABNORMAL LOW (ref 150–450)
RBC: 3.93 x10E6/uL — ABNORMAL LOW (ref 4.14–5.80)
RDW: 19.2 % — ABNORMAL HIGH (ref 11.6–15.4)
WBC: 10.4 10*3/uL (ref 3.4–10.8)

## 2023-03-23 LAB — DRUG SCREEN 764883 11+OXYCO+ALC+CRT-BUND
Amphetamines, Urine: NEGATIVE ng/mL
BENZODIAZ UR QL: NEGATIVE ng/mL
Barbiturate: NEGATIVE ng/mL
Cannabinoid Quant, Ur: NEGATIVE ng/mL
Cocaine (Metabolite): NEGATIVE ng/mL
Creatinine: 93.8 mg/dL (ref 20.0–300.0)
Ethanol: NEGATIVE %
Meperidine: NEGATIVE ng/mL
Methadone Screen, Urine: NEGATIVE ng/mL
OPIATE SCREEN URINE: NEGATIVE ng/mL
Oxycodone/Oxymorphone, Urine: NEGATIVE ng/mL
Phencyclidine: NEGATIVE ng/mL
Propoxyphene: NEGATIVE ng/mL
Tramadol: NEGATIVE ng/mL
pH, Urine: 5.4 (ref 4.5–8.9)

## 2023-03-25 ENCOUNTER — Other Ambulatory Visit (HOSPITAL_COMMUNITY): Payer: Self-pay | Admitting: Nurse Practitioner

## 2023-03-25 ENCOUNTER — Telehealth: Payer: Self-pay

## 2023-03-25 ENCOUNTER — Inpatient Hospital Stay: Payer: 59 | Attending: Hematology and Oncology | Admitting: Hematology and Oncology

## 2023-03-25 ENCOUNTER — Other Ambulatory Visit: Payer: Self-pay

## 2023-03-25 MED ORDER — OXYCODONE HCL 5 MG PO TABS: 5.0000 mg | ORAL_TABLET | Freq: Four times a day (QID) | ORAL | 0 refills | Status: AC | PRN

## 2023-03-25 NOTE — Telephone Encounter (Signed)
Pharmacy Patient Advocate Encounter   Received notification from Physician's Office that prior authorization for oxycodone 5mg  cap is required/requested.   Insurance verification completed.   The patient is insured through [facility]  MEDICAID.   Per test claim: PA required; PA submitted to above mentioned insurance via CoverMyMeds Key/confirmation #/EOC [id number] Status is pending

## 2023-03-25 NOTE — Telephone Encounter (Unsigned)
Copied from CRM [id number]. Topic: Clinical - Prescription Issue >> Mar 25, 2023  9:59 AM Theodis Sato wrote: Reason for CRM: Marguerie from [facility] and sickle cell agency needs Peter Kline to put in a prior auth for PT's pain medication (oxycodone) as well as his anxiety meds and albuterol.

## 2023-03-25 NOTE — Telephone Encounter (Signed)
Pharmacy Patient Advocate Encounter  Received notification from [facility] that Prior Authorization for oxycodone cap has been DENIED.  Full denial letter will be uploaded to the media tab. See denial reason below.   PA #/Case ID/Reference #: [id number]  Insurance prefers tablets, if appropriate please change. Per your health plan's criteria, this drug is covered if you meet the following: If the request is for non-preferred, one of the following: (1) You have tried within the past year, two preferred drugs (opioid analgesics at a dose equivalent to the dose of the drug prescribed): oxycodone (tablet or solution), tramadol, codeine/ acetaminophen (tablet or solution), tramadol/acetaminophen tablet, endocet tablet, hydrocodone/ acetaminophen (tablet or solution), hydrocodone/ibuprofen tablet, hydromorphone tablet, morphine (tablet or solution), oxycodone/acetaminophen tablet (generic Percocet). (2) Your doctor tells [place] that you cannot use one or more of the preferred ingredients (in other words, contraindication to dye).

## 2023-03-26 ENCOUNTER — Other Ambulatory Visit (HOSPITAL_COMMUNITY): Payer: Self-pay | Admitting: Nurse Practitioner

## 2023-03-26 ENCOUNTER — Telehealth: Payer: Self-pay | Admitting: Nurse Practitioner

## 2023-03-26 ENCOUNTER — Other Ambulatory Visit: Payer: Self-pay

## 2023-03-26 DIAGNOSIS — D5720 Sickle-cell/Hb-C disease without crisis: Secondary | ICD-10-CM

## 2023-03-26 MED ORDER — ALBUTEROL SULFATE HFA 108 (90 BASE) MCG/ACT IN AERS: 2.0000 | INHALATION_SPRAY | Freq: Four times a day (QID) | RESPIRATORY_TRACT | 2 refills | Status: DC | PRN

## 2023-03-26 MED ORDER — APIXABAN 5 MG PO TABS: 5.0000 mg | ORAL_TABLET | Freq: Two times a day (BID) | ORAL | 2 refills | Status: AC

## 2023-03-26 MED ORDER — OMEPRAZOLE 20 MG PO CPDR: 20.0000 mg | DELAYED_RELEASE_CAPSULE | Freq: Every day | ORAL | 1 refills | Status: AC

## 2023-03-26 MED ORDER — FOLIC ACID 1 MG PO TABS: 1.0000 mg | ORAL_TABLET | Freq: Every day | ORAL | 11 refills | Status: AC

## 2023-03-26 NOTE — Telephone Encounter (Signed)
Source  Peter Kline (Patient)   Subject  Peter Kline (Patient)   Topic  Clinical - Medication Refill    Summary  patient stated that there was also a mention of a cough medication that was not on the prescription list.  Also the Omeprozole should be 40 mg pharmarcy stated they didn't have any prescriptions for the medications listed about and provider's should call  Communication  Most Recent Primary Care Visit:  Provider: Donell Beers     Department: [facility]     Visit Type: HOSPITAL FU     Date: 03/21/2023        Medication:    albuterol (VENTOLIN HFA) 108 (90 Base) MCG/ACT inhaler    apixaban (ELIQUIS) 5 MG TABS tablet        folic acid (FOLVITE) 1 MG tablet    omeprazole (PRILOSEC) 20 MG capsule    VITAMIN D PO                 Has the patient contacted their pharmacy? Yes    (Agent: If no, request that the patient contact the pharmacy for the refill. If patient does not wish to contact the pharmacy document the reason why and proceed with request.)    (Agent: If yes, when and what did the pharmacy advise?)        Is this the correct pharmacy for this prescription? Yes    If no, delete pharmacy and type the correct one.    This is the patient's preferred pharmacy:    [facility] [address], [place] - [address]    [address] [place] [address]    Phone: [phone] Fax: [phone]        [id number] - [place], [place] - [address] AT [facility] [address]    [address] [place] [address]    Phone: [phone] Fax: [phone]        [facility]    [address]    [place] [address]    Phone: [phone] Fax: [phone]            Has the prescription been filled recently? Yes        Is the patient out of the medication? Yes        Has the patient been seen for an appointment in the last year OR does  the patient have an upcoming appointment? Yes        Can we respond through MyChart? Yes        Agent: Please be advised that Rx refills may take up to 3 business days. We ask that you follow-up with your pharmacy.

## 2023-03-27 ENCOUNTER — Other Ambulatory Visit: Payer: Self-pay

## 2023-03-27 NOTE — Telephone Encounter (Signed)
Pt advised. KH 

## 2023-04-01 ENCOUNTER — Ambulatory Visit: Payer: 59 | Admitting: Occupational Therapy

## 2023-04-01 ENCOUNTER — Ambulatory Visit: Payer: 59 | Attending: Nurse Practitioner | Admitting: Physical Therapy

## 2023-04-17 ENCOUNTER — Other Ambulatory Visit: Payer: Self-pay | Admitting: Nurse Practitioner

## 2023-04-17 DIAGNOSIS — F419 Anxiety disorder, unspecified: Secondary | ICD-10-CM

## 2023-04-17 NOTE — Telephone Encounter (Signed)
 Please advise [facility]

## 2023-04-21 ENCOUNTER — Ambulatory Visit: Payer: Self-pay | Admitting: Nurse Practitioner

## 2023-04-27 NOTE — Progress Notes (Deleted)
 PATIENT: DAVELL BECKSTEAD DOB: 08-24-1965  REASON FOR VISIT: follow up HISTORY FROM: patient PRIMARY NEUROLOGIST:   HISTORY OF PRESENT ILLNESS: Today 04/27/23  GIORGI DEBRUIN is a 58 y.o. male who has been followed in this office for ***. Returns today for follow-up.   HISTORY Mr. MJ WILLIS is a  58 y.o. male  has a past medical history of Shortness of breath and Sickle cell anemia (HCC). who presented was found wandering the streets confused and got intubated for persistent agitation. Etiology is unclear. MRI brain, cEEG unrevealing, failed LP attempts, being treated for aspiration pneumonia. Difficulty weaning off sedation due to agitaition and vent dyssnychrony.    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  Neurological exam likely complicated by encephalopathy from sepsis and pneumonia 10/29 CODE STROKE CT head No acute abnormality. ASPECTS 10.      10/31 MRI   No evidence of intracranial infection.  Left Occipital white matter acute infarct Small chronic infratentorial infarcts including left pons Left frontal lobe encephalomalacia 11/3 MRI Scattered new small acute infarcts bilateral cerebral and cerebellar hemispheres.    LTM EEG 11/2 1008 to 11/2 1340: suggestive of moderate to severe diffuse encephalopathy. No seizures seen.  2D Echo: EF 55-60%, Grade II diastolic dysfunction, Moderately dilated left ateria, Mild to Moderate MVR TEE: EF 55-60%. Bubble study was positive, suggesting a small PFO  US  LE acute DVT in left posterior tibial and peroneal veins. Starting Eliquis .    LDL 49 HgbA1c 4.8 VTE prophylaxis -heparin  IV  No antithrombotic prior to admission, now transitioning to Eliquis .  Therapy recommendations:  SNF Disposition:  pending REVIEW OF SYSTEMS: Out of a complete 14 system review of  symptoms, the patient complains only of the following symptoms, and all other reviewed systems are negative.  ALLERGIES: No Known Allergies  HOME MEDICATIONS: Outpatient Medications Prior to Visit  Medication Sig Dispense Refill   albuterol  (VENTOLIN  HFA) 108 (90 Base) MCG/ACT inhaler Inhale 2 puffs into the lungs every 6 (six) hours as needed for wheezing or shortness of breath. 8 g 2   apixaban  (ELIQUIS ) 5 MG TABS tablet Take 1 tablet (5 mg total) by mouth 2 (two) times daily. 60 tablet 2   busPIRone  (BUSPAR ) 5 MG tablet TAKE 1 TABLET BY MOUTH TWICE A DAY 180 tablet 1   folic acid  (FOLVITE ) 1 MG tablet Take 1 tablet (1 mg total) by mouth daily. 30 tablet 11   hydrocortisone  cream 1 % Apply 1 Application topically 2 (two) times daily. 28 g 0   naloxone  (NARCAN ) nasal spray 4 mg/0.1 mL Place 1 spray (4 mg total) into one nostril once as needed for overdose 1 each 1   omeprazole  (PRILOSEC) 20 MG capsule Take 1 capsule (20 mg total) by mouth daily. 90 capsule 1   oxyCODONE  (OXY IR/ROXICODONE ) 5 MG immediate release tablet Take 1 tablet (5 mg total) by mouth every 6 (six) hours as needed for severe pain (pain score 7-10). 30 tablet 0   VITAMIN D  PO Take by mouth.     No facility-administered medications prior to visit.    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   Priapism  due to disease classified elsewhere 09/30/2012   Retinal hemorrhage 03/21/2023   Shortness of breath    Sickle cell anemia (HCC)    Tobacco dependence 01/24/2014   Vitamin D  deficiency 05/03/2014    PAST SURGICAL HISTORY: Past Surgical History:  Procedure Laterality Date   LAPAROSCOPIC GASTROTOMY W/ REPAIR OF ULCER      FAMILY HISTORY: Family History  Problem Relation Age of Onset   Diabetes Mother     Hypertension Mother     SOCIAL HISTORY: Social History   Socioeconomic History   Marital status: Single    Spouse name: Not on file   Number of children: Not on file   Years of education: Not on file   Highest education level: Not on file  Occupational History   Not on file  Tobacco Use   Smoking status: Former    Current packs/day: 1.00    Types: Cigarettes   Smokeless tobacco: Never  Vaping Use   Vaping status: Never Used  Substance and Sexual Activity   Alcohol use: Yes    Alcohol/week: 6.0 standard drinks of alcohol    Types: 6 Cans of beer per week    Comment: most days   Drug use: Yes    Types: Marijuana    Comment: occ   Sexual activity: Yes  Other Topics Concern   Not on file  Social History Narrative   Living with a friend   Social Drivers of Corporate Investment Banker Strain: Low Risk  (11/15/2021)   Received from [facility]   Overall Financial Resource Strain (CARDIA)    Difficulty of Paying Living Expenses: Not hard at all  Food Insecurity: No Food Insecurity (02/25/2023)   Hunger Vital Sign    Worried About Running Out of Food in the Last Year: Never true    Ran Out of Food in the Last Year: Never true  Transportation Needs: No Transportation Needs (02/25/2023)   PRAPARE - Administrator, Civil Service (Medical): No    Lack of Transportation (Non-Medical): No  Physical Activity: Not on file  Stress: Not on file  Social Connections: Not on file  Intimate Partner Violence: Not At Risk (02/25/2023)   Humiliation, Afraid, Rape, and Kick questionnaire    Fear of Current or Ex-Partner: No    Emotionally Abused: No    Physically Abused: No    Sexually Abused: No      PHYSICAL EXAM  There were no vitals filed for this visit. There is no height or weight on file to calculate BMI.  Generalized: Well developed, in no acute distress   Neurological examination  Mentation: Alert oriented to time, place, history  taking. Follows all commands speech and language fluent Cranial nerve II-XII: Pupils were equal round reactive to light. Extraocular movements were full, visual field were full on confrontational test. Facial sensation and strength were normal. Uvula tongue midline. Head turning and shoulder shrug  were normal and symmetric. Motor: The motor testing reveals 5 over 5 strength of all 4 extremities. Good symmetric motor tone is noted throughout.  Sensory: Sensory testing is intact to soft touch on all 4 extremities. No evidence of extinction is noted.  Coordination: Cerebellar testing reveals good finger-nose-finger and heel-to-shin bilaterally.  Gait and station: Gait is normal. Tandem gait is normal. Romberg is negative. No drift is seen.  Reflexes: Deep tendon reflexes are symmetric and normal bilaterally.   DIAGNOSTIC DATA (LABS, IMAGING, TESTING) - I reviewed patient records,  labs, notes, testing and imaging myself where available.  Lab Results  Component Value Date   WBC 10.4 03/21/2023   HGB 10.9 (L) 03/21/2023   HCT 34.2 (L) 03/21/2023   MCV 87 03/21/2023   PLT 138 (L) 03/21/2023      Component Value Date/Time   NA 142 03/21/2023 1517   K 4.2 03/21/2023 1517   CL 103 03/21/2023 1517   CO2 25 03/21/2023 1517   GLUCOSE 90 03/21/2023 1517   GLUCOSE 102 (H) 03/03/2023 0508   BUN 17 03/21/2023 1517   CREATININE 0.95 03/21/2023 1517   CREATININE 0.92 05/02/2014 1430   CALCIUM 9.2 03/21/2023 1517   PROT 7.4 03/21/2023 1517   ALBUMIN  3.9 03/21/2023 1517   AST 22 03/21/2023 1517   ALT 23 03/21/2023 1517   ALKPHOS 131 (H) 03/21/2023 1517   BILITOT 0.7 03/21/2023 1517   GFRNONAA >60 03/03/2023 0508   GFRNONAA >89 05/02/2014 1430   GFRAA 110 11/25/2018 1534   GFRAA >89 05/02/2014 1430   Lab Results  Component Value Date   CHOL 111 02/17/2023   HDL 27 (L) 02/17/2023   LDLCALC 49 02/17/2023   TRIG 102 02/22/2023   CHOLHDL 4.1 02/17/2023   Lab Results  Component Value Date    HGBA1C 4.8 02/12/2023   Lab Results  Component Value Date   VITAMINB12 539 02/14/2023   Lab Results  Component Value Date   TSH 2.547 02/14/2023      ASSESSMENT AND PLAN 58 y.o. year old male  has a past medical history of Anxiety (03/21/2023), Cerebrovascular accident (CVA) due to thrombosis of precerebral artery (HCC) (02/18/2023), Community acquired pneumonia (02/12/2023), Paradoxical embolism (HCC) (02/20/2023), 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 (09/30/2012), Retinal hemorrhage (03/21/2023), Shortness of breath, Sickle cell anemia (HCC), Tobacco dependence (01/24/2014), and Vitamin D  deficiency (05/03/2014). here with:    *** : Residual deficit: ***. Continue {anticoagulants:31417}  and ***  for secondary stroke prevention.  Discussed secondary stroke prevention measures and importance of close PCP follow up for aggressive stroke risk factor management. I have gone over the pathophysiology of stroke, warning signs and symptoms, risk factors and their management in some detail with instructions to go to the closest emergency room for symptoms of concern. HTN: BP goal <130/90.  Stable on *** per PCP HLD: LDL goal <70. Recent LDL ***.  DMII: A1c goal<7.0. Recent A1c ***.  Encouraged patient to monitor diet and encouraged exercise FU with our office ***      Duwaine Russell, MSN, NP-C 04/27/2023, 8:43 AM [facility] [address], [address] [place], [place] [address] [phone]

## 2023-04-29 ENCOUNTER — Inpatient Hospital Stay: Payer: 59 | Admitting: Adult Health

## 2023-04-29 ENCOUNTER — Inpatient Hospital Stay: Payer: 59 | Admitting: Neurology

## 2023-04-29 ENCOUNTER — Encounter: Payer: Self-pay | Admitting: Neurology

## 2023-05-04 ENCOUNTER — Emergency Department (HOSPITAL_COMMUNITY): Payer: 59

## 2023-05-04 ENCOUNTER — Emergency Department (HOSPITAL_COMMUNITY)
Admission: EM | Admit: 2023-05-04 | Discharge: 2023-05-04 | Disposition: A | Discharge status: Home / Self Care | Payer: 59 | Attending: Emergency Medicine | Admitting: Emergency Medicine

## 2023-05-04 ENCOUNTER — Encounter (HOSPITAL_COMMUNITY): Payer: Self-pay

## 2023-05-04 ENCOUNTER — Other Ambulatory Visit: Payer: Self-pay

## 2023-05-04 DIAGNOSIS — Z8673 Personal history of transient ischemic attack (TIA), and cerebral infarction without residual deficits: Secondary | ICD-10-CM | POA: Diagnosis not present

## 2023-05-04 DIAGNOSIS — W000XXA Fall on same level due to ice and snow, initial encounter: Secondary | ICD-10-CM | POA: Insufficient documentation

## 2023-05-04 DIAGNOSIS — S82832A Other fracture of upper and lower end of left fibula, initial encounter for closed fracture: Secondary | ICD-10-CM | POA: Insufficient documentation

## 2023-05-04 DIAGNOSIS — M25561 Pain in right knee: Secondary | ICD-10-CM | POA: Diagnosis present

## 2023-05-04 DIAGNOSIS — Z7901 Long term (current) use of anticoagulants: Secondary | ICD-10-CM | POA: Diagnosis not present

## 2023-05-04 MED ORDER — HYDROCODONE-ACETAMINOPHEN 5-325 MG PO TABS: 1.0000 | ORAL_TABLET | Freq: Four times a day (QID) | ORAL | 0 refills | Status: AC | PRN

## 2023-05-04 MED ORDER — ACETAMINOPHEN 325 MG PO TABS
650.0000 mg | ORAL_TABLET | Freq: Once | ORAL | Status: AC
  Administered 2023-05-04: 650 mg via ORAL
  Filled 2023-05-04: qty 2

## 2023-05-04 NOTE — ED Notes (Signed)
X-ray at bedside

## 2023-05-04 NOTE — ED Provider Notes (Signed)
[place] EMERGENCY DEPARTMENT AT [facility] Provider Note   CSN: [id number] Arrival date & time: 05/04/23  1018     History  Chief Complaint  Patient presents with   Leg Pain    Peter Kline is a 58 y.o. male with medical history of sickle cell anemia, vitamin D deficiency, CVA on anticoagulation.  Patient presents to ED for evaluation of fall.  Reports that on Friday he had a ground-level fall secondary to slipping on ice.  States that at this time he landed on his right knee and has had pain from his right knee down to his right ankle since then.  Patient reports that he takes anticoagulation for previous CVA and he did "bump" the back of his head but denies loss of consciousness.  Denies any nausea, vomiting or neck pain since this time.  He is endorsing a headache.  Denies any other concerns.  States he is been taking oxycodone at home for pain.   Leg Pain      Home Medications Prior to Admission medications   Medication Sig Start Date End Date Taking? Authorizing Provider  HYDROcodone-acetaminophen (NORCO/VICODIN) 5-325 MG tablet Take 1 tablet by mouth every 6 (six) hours as needed for severe pain (pain score 7-10). 05/04/23  Yes Al Decant, PA-C  albuterol (VENTOLIN HFA) 108 (90 Base) MCG/ACT inhaler Inhale 2 puffs into the lungs every 6 (six) hours as needed for wheezing or shortness of breath. 03/26/23   Paseda, Baird Kay, FNP  apixaban (ELIQUIS) 5 MG TABS tablet Take 1 tablet (5 mg total) by mouth 2 (two) times daily. 03/26/23 05/25/23  Paseda, Baird Kay, FNP  busPIRone (BUSPAR) 5 MG tablet TAKE 1 TABLET BY MOUTH TWICE A DAY 04/18/23   Paseda, Baird Kay, FNP  folic acid (FOLVITE) 1 MG tablet Take 1 tablet (1 mg total) by mouth daily. 03/26/23   Donell Beers, FNP  hydrocortisone cream 1 % Apply 1 Application topically 2 (two) times daily. 03/03/23   Dorcas Carrow, MD  naloxone [facility]) nasal spray 4 mg/0.1 mL Place 1 spray (4 mg total) into  one nostril once as needed for overdose 03/21/23   Edwin Dada R, FNP  omeprazole (PRILOSEC) 20 MG capsule Take 1 capsule (20 mg total) by mouth daily. 03/26/23   Donell Beers, FNP  oxyCODONE (OXY IR/ROXICODONE) 5 MG immediate release tablet Take 1 tablet (5 mg total) by mouth every 6 (six) hours as needed for severe pain (pain score 7-10). 03/25/23   Donell Beers, FNP  VITAMIN D PO Take by mouth.    [provider]      Allergies    Patient has no known allergies.    Review of Systems   Review of Systems  Musculoskeletal:  Positive for arthralgias and myalgias.  Neurological:  Positive for headaches.  All other systems reviewed and are negative.   Physical Exam Updated Vital Signs BP (!) 167/105   Pulse 65   Temp 98.6 F (37 C)   Resp 17   Ht 6' (1.829 m)   Wt 79.9 kg   SpO2 98%   BMI 23.89 kg/m  Physical Exam Vitals and nursing note reviewed.  Constitutional:      General: He is not in acute distress.    Appearance: He is well-developed.  HENT:     Head: Normocephalic and atraumatic.  Eyes:     Conjunctiva/sclera: Conjunctivae normal.  Cardiovascular:     Rate and Rhythm: Normal rate  and regular rhythm.     Heart sounds: No murmur heard. Pulmonary:     Effort: Pulmonary effort is normal. No respiratory distress.     Breath sounds: Normal breath sounds.  Abdominal:     Palpations: Abdomen is soft.     Tenderness: There is no abdominal tenderness.  Musculoskeletal:        General: No swelling.     Cervical back: Neck supple.     Left knee: No swelling or deformity. Decreased range of motion. Tenderness present.     Left ankle: Tenderness present. Decreased range of motion.     Comments: Slightly reduced range of motion of left knee secondary to pain.  Full range of motion of right ankle.  No obvious deformity.  Skin:    General: Skin is warm and dry.     Capillary Refill: Capillary refill takes less than 2 seconds.  Neurological:      General: No focal deficit present.     Mental Status: He is alert.     GCS: GCS eye subscore is 4. GCS verbal subscore is 5. GCS motor subscore is 6.     Cranial Nerves: Cranial nerves 2-12 are intact. No cranial nerve deficit.     Sensory: Sensation is intact. No sensory deficit.     Motor: Motor function is intact. No weakness.     Comments: No deficits on neuroexam  Psychiatric:        Mood and Affect: Mood normal.     ED Results / Procedures / Treatments   Labs (all labs ordered are listed, but only abnormal results are displayed) Labs Reviewed - No data to display  EKG None  Radiology CT Head Wo Contrast Result Date: 05/04/2023 CLINICAL DATA:  Provided history: Head trauma, moderate/severe. Fall, on blood thinners. EXAM: CT HEAD WITHOUT CONTRAST TECHNIQUE: Contiguous axial images were obtained from the base of the skull through the vertex without intravenous contrast. RADIATION DOSE REDUCTION: This exam was performed according to the departmental dose-optimization program which includes automated exposure control, adjustment of the mA and/or kV according to patient size and/or use of iterative reconstruction technique. COMPARISON:  And CT angiogram head/neck 02/16/2023. Brain MRI 02/16/2023. FINDINGS: Brain: Mild generalized cerebral volume loss. Small focus of chronic encephalomalacia again noted within the anterior left frontal lobe. Mild patchy and ill-defined hypoattenuation within the cerebral white matter, nonspecific but compatible with chronic ischemic changes. Chronic infarct again noted within the left aspect of the pons. There is no acute intracranial hemorrhage. No demarcated cortical infarct. No extra-axial fluid collection. No evidence of an intracranial mass. No midline shift. Vascular: No hyperdense vessel.  Atherosclerotic calcifications. Skull: No calvarial fracture or aggressive osseous lesion. Sinuses/Orbits: No mass or acute finding within the imaged orbits. Chronic,  medially displaced fracture deformity of the left lamina papyracea. No significant inflammatory paranasal sinus disease at the imaged levels. IMPRESSION: 1.  No evidence of an acute intracranial abnormality. 2. Small focus of chronic encephalomalacia within the anterior left frontal lobe, which may be posttraumatic in etiology or may reflect a chronic infarct. 3. Chronic ischemic changes within the cerebral white matter. 4. Chronic infarct within the left aspect of the pons. 5. Mild generalized cerebral atrophy. Electronically Signed   By: Jackey Loge D.O.   On: 05/04/2023 13:28   DG Ankle Complete Left Result Date: 05/04/2023 CLINICAL DATA:  Fall with ankle pain EXAM: LEFT ANKLE COMPLETE - 3+ VIEW COMPARISON:  None Available. FINDINGS: There is no evidence of fracture,  dislocation, or joint effusion. Chronic degenerative disease the ankle joint with a few small loose bodies in the anterior recess. IMPRESSION: No acute or traumatic finding. 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 Electronically Signed   By: Paulina Fusi M.D.   On: 05/04/2023 13:02   DG Knee Complete 4 Views Left Result Date: 05/04/2023 CLINICAL DATA:  Larey Seat 2 days ago with knee pain EXAM: LEFT KNEE - COMPLETE 4+ VIEW COMPARISON:  None Available. FINDINGS: No visible knee joint effusion. There is a fracture of the head of the fibula. No fracture of the patella, tibia or femur is identified. Chronic shot within the soft tissues. IMPRESSION: Fracture of the head of the fibula. Electronically Signed   By: Paulina Fusi M.D.   On: 05/04/2023 13:01    Procedures Procedures   Medications Ordered in ED Medications  acetaminophen (TYLENOL) tablet 650 mg (650 mg Oral Given 05/04/23 1229)    ED Course/ Medical Decision Making/ A&P Clinical Course as of 05/04/23 1457  Sun May 04, 2023  1216 Larey Seat Friday, has had pain from R knee to R anlke since then. Takes AC, "bumped" back of his  head. No LOC. No nausea, vomiting. No neck pain. Denies medications prior to arrival. Neuro exam WNL [CG]    Clinical Course User Index [CG] Al Decant, PA-C   Medical Decision Making Amount and/or Complexity of Data Reviewed Radiology: ordered.  Risk OTC drugs.   58 year old male presents for evaluation of fall.  Please see HPI for further details.  On examination patient is afebrile, nontachycardic.  Lung sounds are clear bilaterally, he is not hypoxic.  His abdomen is soft and compressible.  Neurological examination is at baseline.  Patient has full range of motion of left ankle, slightly reduced range of motion of left knee secondary to pain.  No obvious deformity.  Will collect x-ray imaging of left ankle, left knee.  Will also collect CT scan of head as patient is on anticoagulation and reports that he did hit his head during this fall.  CT scan of head unremarkable.  No acute process noted.  Patient left ankle x-ray unremarkable.  Plain film imaging of left knee shows a fibular head fracture.  The patient was placed in a long-leg splint.  Patient will follow-up with orthopedics.  Also provided with crutches.  Will send him home with pain medication.  Patient encouraged to follow-up with orthopedics.  Return precautions were provided.  Patient advised to not weight-bear on his left lower extremity until seen by orthopedics and he voiced understanding.  Stable to discharge home.   Final Clinical Impression(s) / ED Diagnoses Final diagnoses:  Closed fracture of proximal end of left fibula, unspecified fracture morphology, initial encounter    Rx / DC Orders ED Discharge Orders          Ordered    HYDROcodone-acetaminophen (NORCO/VICODIN) 5-325 MG tablet  Every 6 hours PRN        05/04/23 1456              Al Decant, PA-C 05/04/23 1457    Margarita Grizzle, MD 05/04/23 [phone]

## 2023-05-04 NOTE — ED Notes (Signed)
Ortho called for long leg splint application

## 2023-05-04 NOTE — ED Triage Notes (Signed)
Pt had a fall Friday night, injuring his left leg. Pt complains of pain from the knee down. Pt is ambulatory with his walking crutch.

## 2023-05-04 NOTE — Progress Notes (Signed)
Orthopedic Tech Progress Note Patient Details:  Peter Kline 03/10/1966 [id number]  Ortho Devices Type of Ortho Device: Crutches, Long leg splint Ortho Device/Splint Location: LLE Ortho Device/Splint Interventions: Ordered, Application, Adjustment   Post Interventions Patient Tolerated: Well Instructions Provided: Care of device  Tonye Pearson 05/04/2023, 2:25 PM

## 2023-05-04 NOTE — Discharge Instructions (Addendum)
As we discussed, your x-ray imaging reveals a fibular head fracture.  Please do not apply weight to your left lower extremity until seen by orthopedics.  Please utilize crutches.  Please take hydrocodone pain medication sent to your pharmacy every 6 hours as needed.  This medication also has Tylenol compounded into it.  Please return to the ED with any new or worsening symptoms.  Please read the attached guide concerning tibial and fibular fractures.

## 2023-05-06 ENCOUNTER — Telehealth: Payer: Self-pay

## 2023-05-06 NOTE — Transitions of Care (Post Inpatient/ED Visit) (Signed)
   05/06/2023  Name: Peter Kline MRN: [id number] DOB: 25-Jul-1965  Today's TOC FU Call Status:   Patient's Name and Date of Birth confirmed.  Transition Care Management Follow-up Telephone Call Date of Discharge: 05/04/23 Discharge Facility: [facility]) Type of Discharge: Emergency Department Reason for ED Visit: Other: How have you been since you were released from the hospital?: Same Any questions or concerns?: No  Items Reviewed: Did you receive and understand the discharge instructions provided?: Yes Medications obtained,verified, and reconciled?: Yes (Medications Reviewed) Any new allergies since your discharge?: No Dietary orders reviewed?: No Do you have support at home?: Yes People in Home: significant other Name of Support/Comfort Primary Source: Wife  Medications Reviewed Today: Medications Reviewed Today   Medications were not reviewed in this encounter     Home Care and Equipment/Supplies: Were Home Health Services Ordered?: No Any new equipment or medical supplies ordered?: Yes Name of Medical supply agency?: crutches from discharge Were you able to get the equipment/medical supplies?: Yes Do you have any questions related to the use of the equipment/supplies?: No  Functional Questionnaire: Do you need assistance with bathing/showering or dressing?: Yes Do you need assistance with meal preparation?: Yes Do you need assistance with eating?: Yes Do you have difficulty maintaining continence: Yes Do you need assistance with getting out of bed/getting out of a chair/moving?: Yes Do you have difficulty managing or taking your medications?: No  Follow up appointments reviewed: PCP Follow-up appointment confirmed?: Yes Date of PCP follow-up appointment?: 05/16/23 Specialist Hospital Follow-up appointment confirmed?: Yes Date of Specialist follow-up appointment?: 05/07/23 Do you need transportation to your follow-up appointment?:  Yes    SIGNATURE Paseda, Baird Kay, FNP

## 2023-05-06 NOTE — Telephone Encounter (Signed)
See note

## 2023-05-13 ENCOUNTER — Emergency Department (HOSPITAL_COMMUNITY): Payer: 59

## 2023-05-13 ENCOUNTER — Emergency Department (HOSPITAL_COMMUNITY)
Admission: EM | Admit: 2023-05-13 | Discharge: 2023-05-13 | Disposition: A | Discharge status: Home / Self Care | Payer: 59 | Attending: Emergency Medicine | Admitting: Emergency Medicine

## 2023-05-13 DIAGNOSIS — W010XXD Fall on same level from slipping, tripping and stumbling without subsequent striking against object, subsequent encounter: Secondary | ICD-10-CM | POA: Insufficient documentation

## 2023-05-13 DIAGNOSIS — S8992XD Unspecified injury of left lower leg, subsequent encounter: Secondary | ICD-10-CM | POA: Diagnosis present

## 2023-05-13 DIAGNOSIS — Z7901 Long term (current) use of anticoagulants: Secondary | ICD-10-CM | POA: Insufficient documentation

## 2023-05-13 DIAGNOSIS — M7989 Other specified soft tissue disorders: Secondary | ICD-10-CM | POA: Insufficient documentation

## 2023-05-13 DIAGNOSIS — S82832D Other fracture of upper and lower end of left fibula, subsequent encounter for closed fracture with routine healing: Secondary | ICD-10-CM | POA: Insufficient documentation

## 2023-05-13 DIAGNOSIS — S82832G Other fracture of upper and lower end of left fibula, subsequent encounter for closed fracture with delayed healing: Secondary | ICD-10-CM

## 2023-05-13 NOTE — Progress Notes (Signed)
Orthopedic Tech Progress Note Patient Details:  Peter Kline 11/01/1965 [id number]  Ortho Devices Type of Ortho Device: Ace wrap, Cotton web roll, Crutches, Long leg splint Ortho Device/Splint Location: left long leg posterior splint applied. crutches sized and instructed on use Ortho Device/Splint Interventions: Ordered, Application, Adjustment   Post Interventions Patient Tolerated: Well Instructions Provided: Adjustment of device, Care of device  Kizzie Fantasia 05/13/2023, 12:52 PM
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
## 2023-05-13 NOTE — ED Triage Notes (Signed)
Pt walked into the ED , no crutches

## 2023-05-13 NOTE — Discharge Instructions (Signed)
Please call and follow-up closely with orthopedist for further managements of your knee injury.  You will likely need a cast.

## 2023-05-13 NOTE — ED Provider Triage Note (Signed)
Emergency Medicine Provider Triage Evaluation Note  Peter Kline , a 58 y.o. male  was evaluated in triage.  Pt complains of left knee pain.  Review of Systems  Positive: Left knee pain Negative: Ha, swelling   Physical Exam  BP (!) 149/96 (BP Location: Left Arm)   Pulse 61   Temp 97.8 F (36.6 C) (Oral)   Resp 16   SpO2 98%  Gen:   Awake, no distress   Resp:  Normal effort  MSK:   Moves extremities without difficulty  Other:  Tenderness over fibular head  Medical Decision Making  Medically screening exam initiated at 10:16 AM.  Appropriate orders placed.  Peter Kline was informed that the remainder of the evaluation will be completed by another provider, this initial triage assessment does not replace that evaluation, and the importance of remaining in the ED until their evaluation is complete.  Took off cast, fracture 9 days ago, now having worsening pain. Has been walking on it.    Smitty Knudsen, PA-C 05/13/23 1017

## 2023-05-13 NOTE — ED Triage Notes (Signed)
Pt here from home asking to have his cast placed back on pt took it off due to a bug bite under the cast

## 2023-05-14 ENCOUNTER — Telehealth: Payer: Self-pay

## 2023-05-14 NOTE — Transitions of Care (Post Inpatient/ED Visit) (Cosign Needed)
   05/14/2023  Name: MAANAV KASSABIAN MRN: [id number] DOB: [birth year]  Today's TOC FU Call Status: Today's TOC FU Call Status:: Unsuccessful Call (1st Attempt) Unsuccessful Call (1st Attempt) Date: 05/14/23  Attempted to reach the patient regarding the most recent Inpatient/ED visit.  Follow Up Plan: Additional outreach attempts will be made to reach the patient to complete the Transitions of Care (Post Inpatient/ED visit) call.   Signature Renelda Loma RMA

## 2023-05-16 ENCOUNTER — Telehealth: Payer: Self-pay

## 2023-05-16 ENCOUNTER — Ambulatory Visit: Payer: Self-pay | Admitting: Nurse Practitioner

## 2023-05-16 NOTE — Transitions of Care (Post Inpatient/ED Visit) (Signed)
   05/16/2023  Name: ADELAIDO NICKLAUS MRN: [id number] DOB: 20-Jan-1966  Today's TOC FU Call Status: Today's TOC FU Call Status:: Unsuccessful Call (2nd Attempt) Unsuccessful Call (2nd Attempt) Date: 05/16/23  Attempted to reach the patient regarding the most recent Inpatient/ED visit.  Follow Up Plan: Additional outreach attempts will be made to reach the patient to complete the Transitions of Care (Post Inpatient/ED visit) call.   Signature  [employer], [place]

## 2023-05-20 ENCOUNTER — Telehealth: Payer: Self-pay

## 2023-05-20 NOTE — Transitions of Care (Post Inpatient/ED Visit) (Signed)
   05/20/2023  Name: KENTRELL HALLAHAN MRN: [id number] DOB: 04-Jun-1965  Today's TOC FU Call Status: Today's TOC FU Call Status:: Unsuccessful Call (3rd Attempt) Unsuccessful Call (3rd Attempt) Date: 05/20/23  Attempted to reach the patient regarding the most recent Inpatient/ED visit.  Follow Up Plan: No further outreach attempts will be made at this time. We have been unable to contact the patient.  Signature  [employer], CMA

## 2023-06-30 ENCOUNTER — Telehealth: Payer: Self-pay

## 2023-06-30 NOTE — Transitions of Care (Post Inpatient/ED Visit) (Signed)
   06/30/2023  Name: Peter Kline MRN: [id number] DOB: Mar 28, 1966  Today's TOC FU Call Status: Today's TOC FU Call Status:: Unsuccessful Call (1st Attempt) Unsuccessful Call (1st Attempt) Date: 06/30/23  Attempted to reach the patient regarding the most recent Inpatient/ED visit.  Follow Up Plan: Additional outreach attempts will be made to reach the patient to complete the Transitions of Care (Post Inpatient/ED visit) call.   Signature .kh

## 2023-07-01 ENCOUNTER — Telehealth: Payer: Self-pay

## 2023-07-01 NOTE — Transitions of Care (Post Inpatient/ED Visit) (Signed)
   07/01/2023  Name: Peter Kline MRN: [id number] DOB: 10-05-1965  Pt had a regular appointment with [facility] and no need for Hospital follow up.  Renelda Loma RMA

## 2023-07-08 ENCOUNTER — Telehealth: Payer: Self-pay | Admitting: Nurse Practitioner

## 2023-07-08 NOTE — Telephone Encounter (Signed)
 Copied from CRM [id number]. Topic: Clinical - Medication Question >> Jul 08, 2023  4:02 PM Elle L wrote: Reason for CRM: The patient states he is prescribed oxyCODONE (OXY IR/ROXICODONE) 10 MG by his provider at [facility] where he has been having infusions for sickle cell anemia but he has been unable to reach them and is requesting to see if FNP Paseda can prescribe the medication. I did confirm that he is not currently having symptoms that would need to be triaged.  [facility] Pharmacy: [facility] [address] [place] - [address] [place] [address] Phone: [phone]  Fax: [phone]

## 2023-07-11 ENCOUNTER — Other Ambulatory Visit: Payer: Self-pay | Admitting: Nurse Practitioner

## 2023-08-18 ENCOUNTER — Other Ambulatory Visit: Payer: Self-pay | Admitting: Physician Assistant

## 2023-08-18 DIAGNOSIS — R590 Localized enlarged lymph nodes: Secondary | ICD-10-CM

## 2023-09-10 ENCOUNTER — Other Ambulatory Visit: Payer: Self-pay | Admitting: Nurse Practitioner

## 2024-02-11 NOTE — Progress Notes (Signed)
 [facility] Communication and Functional Needs Assessment  Service Delivery Location:  [facility]  YUJI WALTH MRN: [id number] y.o. Accompanied to appointment by: unaccompanied  Time in:  1330 Time out:  1415 Right ear pain: none, 0/10 Left ear pain: none, 0/10   Peter Kline presents today for a Communication and Functional Needs Assessment.   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 Please see separate [facility]/Epic note for details.   In addition to traditional audiometric testing, to further evaluate the patient's communication needs, the following tests/questionnaires were administered:  1) HHIA (Hearing Handicap Inventory for Adults) The HHIA is a 25 item questionnaire that assesses the emotional and social/situational impact of hearing loss on a patient's daily life.   Total Score: 90% (significant handicap)  Emotional Subscale: 96% (significant handicap)  Social Subscale: 85% (significant handicap)  *0-16 = no hearing handicap, 18-42 = mild-moderate hearing handicap, 44-100 = severe hearing handicap  2) COSI (Client Oriented Scale Of Improvement) The COSI is a subjective hearing aid outcome measure, administered by the audiologist, that assesses the patient's needs/goals and measures his or her improvement in hearing ability post-fitting. The patient identified the following goals for amplification (listed in order of importance):  Specific Needs/Goals Degree of Change Final Ability (with hearing aid)  Difficulty understanding his sister N/A - baseline N/A - baseline  Difficulty hearing in restaurants when with a group of family N/A - baseline N/A - baseline   3) QuickSIN (Quick Speech In Noise test) The QuickSIN is a speech-in-noise test that measures the ability to hear in noise. This is important since speech understanding in noise cannot be  reliably predicted from the pure tone audiogram or other standard audiometric tests. Results are calculated in SNR loss (signal-to-noise ratio loss) which is the increased signal-to-noise ratio required by an individual to understand speech in noise, as compared to someone with normal hearing.    Did not assess.  4) PROMIS Cognitive Screener:   Refer (raw score: <30) - patient was referred to their primary care physician for further assessment/discussion  Topics Discussed   Previous hearing aid user no  Telecoil/loop system needs yes (per [facility] guidelines)  Dexterity Concerns no  Connectivity (FM, Bluetooth etc) Bluetooth  Smart phone? Model used? yes: unspecified. Pt stated he uses C phone, but did not have it in office today  Recommended hearing aid style(s) receiver-in-canal  Earmold impression(s)? 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 He spends his free time taking care of his grandchildren, watching TV, attending Church services, and going out to restaurants about 4x/per week.   Patient/family was counseled on the function and purpose of amplification and options associated, including prescription versus over-the-counter (OTC) devices and assistive listening devices. Also, patient/family counseled on realistic expectations of amplification and communication strategies to improve hearing and understanding of conversation.   Decision:  In consideration of hearing needs, ear size, patient preference, and audiometric data, a trial with binaural Phonak Audeo L50-RT is recommended.   Financial information    Payor NCDSD[facility] for left hearing aid; patient plans to pay out of pocket for right hearing aid  Quote for above recommended fitting  Patient was quoted $750 for right hearing aid, as cost for left hearing aid would be covered in full if approved by NCDSD[facility]   Does patient wish  to proceed with hearing aid purchase? Yes, pending NCDSDHH approval   Hearing aids funded by [facility] will include a 30 day trial period, during which time patient may return or exchange the device. All clinical services will be provided at no additional charge for the length of the warranty period, per NCDSDHH guidelines. Following this, clinical services will have a charge associated with them (e.g., cleaning, repairs, and adjustments) unless an additional service plan is purchased.    Service plans are available to purchase and cover the following: Unlimited hearing aid clean and check visits. Unlimited in-house repairs, including replacement wax guards and microphone screens/covers. Field seismologist. Electroacoustic evaluations, as needed. Reprogramming, as needed. Real-ear measurement, as needed. Unlimited tubing changes.  The service plan options are: Year Two or per year - Intro $500.00 per patient Years Two and Three - Expanded $ 900.00 per patient Years Two through 46 - Complete $1700.00 per patient   The hearing aids are covered by a 3 year manufacturer's warranty for repair and loss/damage.    PLAN: Patient was provided with completed certification form, as well as copy of hearing test and contact information for [facility]. Patient will be scheduled for hearing aid fitting and hearing aid will be ordered pending approval by NCDSD[facility].     EDUCATION: Patient education re: role of audiologist and communication were provided via verbal communication. No barriers to education were identified. Patient verbally expressed understanding of information provided.    Please contact [facility] at [phone] if there are any questions.

## 2024-03-30 ENCOUNTER — Other Ambulatory Visit: Payer: Self-pay

## 2024-03-30 ENCOUNTER — Emergency Department (HOSPITAL_COMMUNITY)
Admission: EM | Admit: 2024-03-30 | Discharge: 2024-03-31 | Disposition: A | Attending: Emergency Medicine | Admitting: Emergency Medicine

## 2024-03-30 ENCOUNTER — Encounter (HOSPITAL_COMMUNITY): Payer: Self-pay

## 2024-03-30 DIAGNOSIS — Z8673 Personal history of transient ischemic attack (TIA), and cerebral infarction without residual deficits: Secondary | ICD-10-CM | POA: Diagnosis not present

## 2024-03-30 DIAGNOSIS — R590 Localized enlarged lymph nodes: Secondary | ICD-10-CM | POA: Diagnosis present

## 2024-03-30 DIAGNOSIS — F17200 Nicotine dependence, unspecified, uncomplicated: Secondary | ICD-10-CM | POA: Diagnosis not present

## 2024-03-30 DIAGNOSIS — Z7901 Long term (current) use of anticoagulants: Secondary | ICD-10-CM | POA: Diagnosis not present

## 2024-03-30 DIAGNOSIS — D72829 Elevated white blood cell count, unspecified: Secondary | ICD-10-CM | POA: Diagnosis not present

## 2024-03-30 LAB — COMPREHENSIVE METABOLIC PANEL WITH GFR
ALT: 14 U/L (ref 0–44)
AST: 22 U/L (ref 15–41)
Albumin: 3.7 g/dL (ref 3.5–5.0)
Alkaline Phosphatase: 103 U/L (ref 38–126)
Anion gap: 13 (ref 5–15)
BUN: 12 mg/dL (ref 6–20)
CO2: 26 mmol/L (ref 22–32)
Calcium: 9.8 mg/dL (ref 8.9–10.3)
Chloride: 99 mmol/L (ref 98–111)
Creatinine, Ser: 0.66 mg/dL (ref 0.61–1.24)
GFR, Estimated: >60 mL/min (ref >60)
Glucose, Bld: 114 mg/dL — ABNORMAL HIGH (ref 70–99)
Potassium: 4 mmol/L (ref 3.5–5.1)
Sodium: 138 mmol/L (ref 135–145)
Total Bilirubin: 0.4 mg/dL (ref 0.0–1.2)
Total Protein: 8.1 g/dL (ref 6.5–8.1)

## 2024-03-30 NOTE — ED Triage Notes (Signed)
 Pt BIB EMS from home with reports of painful nodules down his neck and groin for months/years.

## 2024-03-31 ENCOUNTER — Emergency Department (HOSPITAL_COMMUNITY)

## 2024-03-31 ENCOUNTER — Encounter (HOSPITAL_COMMUNITY): Payer: Self-pay | Admitting: Radiology

## 2024-03-31 LAB — RETICULOCYTES
Immature Retic Fract: 24.3 % — ABNORMAL HIGH (ref 2.3–15.9)
RBC.: 4.25 MIL/uL (ref 4.22–5.81)
Retic Count, Absolute: 150 K/uL (ref 19.0–186.0)
Retic Ct Pct: 3.5 % — ABNORMAL HIGH (ref 0.4–3.1)

## 2024-03-31 LAB — CBC WITH DIFFERENTIAL/PLATELET
Abs Immature Granulocytes: 0.14 K/uL — ABNORMAL HIGH (ref 0.00–0.07)
Basophils Absolute: 0.1 K/uL (ref 0.0–0.1)
Basophils Relative: 1 %
Eosinophils Absolute: 1.5 K/uL — ABNORMAL HIGH (ref 0.0–0.5)
Eosinophils Relative: 6 %
HCT: 34.3 % — ABNORMAL LOW (ref 39.0–52.0)
Hemoglobin: 12 g/dL — ABNORMAL LOW (ref 13.0–17.0)
Immature Granulocytes: 1 %
Lymphocytes Relative: 6 %
Lymphs Abs: 1.6 K/uL (ref 0.7–4.0)
MCH: 29.2 pg (ref 26.0–34.0)
MCHC: 35 g/dL (ref 30.0–36.0)
MCV: 83.5 fL (ref 80.0–100.0)
Monocytes Absolute: 2.2 K/uL — ABNORMAL HIGH (ref 0.1–1.0)
Monocytes Relative: 9 %
Neutro Abs: 19.7 K/uL — ABNORMAL HIGH (ref 1.7–7.7)
Neutrophils Relative %: 77 %
Platelets: 678 K/uL — ABNORMAL HIGH (ref 150–400)
RBC: 4.11 MIL/uL — ABNORMAL LOW (ref 4.22–5.81)
RDW: 18.8 % — ABNORMAL HIGH (ref 11.5–15.5)
WBC: 25.3 K/uL — ABNORMAL HIGH (ref 4.0–10.5)
nRBC: 0 % (ref 0.0–0.2)

## 2024-03-31 LAB — RAPID HIV SCREEN (HIV 1/2 AB+AG)
HIV 1/2 Antibodies: NONREACTIVE
HIV-1 P24 Antigen - HIV24: NONREACTIVE

## 2024-03-31 LAB — SYPHILIS: RPR W/REFLEX TO RPR TITER AND TREPONEMAL ANTIBODIES, TRADITIONAL SCREENING AND DIAGNOSIS ALGORITHM: RPR Ser Ql: NONREACTIVE

## 2024-03-31 MED ORDER — HYDROMORPHONE HCL 1 MG/ML IJ SOLN
1.0000 mg | Freq: Once | INTRAMUSCULAR | Status: AC
  Administered 2024-03-31: 09:00:00 1 mg via INTRAVENOUS
  Filled 2024-03-31: qty 1

## 2024-03-31 MED ORDER — IOHEXOL 300 MG/ML  SOLN
75.0000 mL | Freq: Once | INTRAMUSCULAR | Status: AC | PRN
  Administered 2024-03-31: 07:00:00 75 mL via INTRAVENOUS

## 2024-03-31 MED ORDER — IOHEXOL 300 MG/ML  SOLN
75.0000 mL | Freq: Once | INTRAMUSCULAR | Status: AC | PRN
  Administered 2024-03-31: 10:00:00 75 mL via INTRAVENOUS

## 2024-03-31 NOTE — ED Provider Notes (Signed)
 Worsening long term lymphadenopathy. F/U CT head neck to r/o abscess. Established at [facility] with heme/onc. Physical Exam  BP 128/88   Pulse (!) 101   Temp 98.3 F (36.8 C) (Oral)   Resp 17   SpO2 100%   Physical Exam  Procedures  Procedures  ED Course / MDM   Clinical Course as of 03/31/24 1108  Wed Mar 31, 2024  9460 Patient presents with worsening lymphadenopathy.  Patient is currently being worked up for this by [facility] hematology/oncology and had a recent biopsy in November that was negative for malignancy.  He denies any known fevers or chills.  No cough or congestion.  No nausea or vomiting.  No other physical complaints.  He reports that the nodes are tender.  Patient has been negative for HIV.  Does not appear to to have been tested for syphilis or tuberculosis. [PC]  0540 Differential diagnosis considered.  Workup below.  CBC with new leukocytosis of 25,000.  No significant anemia. CMP without significant electrolyte derangements or renal insufficiency. Reticulocyte count close to his baseline.  Ordered rapid HIV screen, RPR, QuantiFERON-TB and EBV VCA panel.  Will also get CT of the cervical spine to assess for infectious etiology. If negative other than LAD, he can follow up with his [facility] MD.  [PC]  [phone] Patient care turned over to oncoming provider. Patient case and results discussed in detail; please see their note for further ED managment.     [PC]    Clinical Course User Index [PC] Cardama, Raynell Moder, MD   Medical Decision Making Amount and/or Complexity of Data Reviewed Labs: ordered. Radiology: ordered.  Risk Prescription drug management.   Reviewed the CT head and neck findings.  I discussed with the patient.  He reports that he actually was very concerned and came in due to severe pain he was having in his inguinal region with there is a large mass and swelling and lower back.  On exam patient does have fairly massive inguinal groin  lymphadenopathy.  CT imaging obtained of these areas and no obstructive process present. Was treated for pain with Dilaudid .  He reports he has run out of early of his oxycodone .  I will provide 10 tablets.  Patient is established with hematology at [facility].  At time of discharge, patient is comfortable appearance.  He is alert nontoxic.  Mental status clear.  No respiratory distress no difficulty breathing.  Vital signs stable.       Armenta Canning, MD 03/31/24 1110

## 2024-03-31 NOTE — Discharge Instructions (Addendum)
 1.  See your hematologist as soon as possible.

## 2024-03-31 NOTE — ED Notes (Signed)
 Uncle in emergency contact needs to be contacted with update. Permission was given from patient.

## 2024-03-31 NOTE — ED Provider Notes (Signed)
 [place] EMERGENCY DEPARTMENT AT [facility] Provider Note  CSN: [id number] Arrival date & time: 03/30/24 2316  Chief Complaint(s) Mass  HPI & MDM Peter Kline is a 58 y.o. male here for  worsening LAD HPI   Clinical Course as of 03/31/24 0738  Wed Mar 31, 2024  0539 Patient presents with worsening lymphadenopathy.  Patient is currently being worked up for this by [facility] hematology/oncology and had a recent biopsy in November that was negative for malignancy.  He denies any known fevers or chills.  No cough or congestion.  No nausea or vomiting.  No other physical complaints.  He reports that the nodes are tender.  Patient has been negative for HIV.  Does not appear to to have been tested for syphilis or tuberculosis. [PC]  0540 Differential diagnosis considered.  Workup below.  CBC with new leukocytosis of 25,000.  No significant anemia. CMP without significant electrolyte derangements or renal insufficiency. Reticulocyte count close to his baseline.  Ordered rapid HIV screen, RPR, QuantiFERON-TB and EBV VCA panel.  Will also get CT of the cervical spine to assess for infectious etiology. If negative other than LAD, he can follow up with his [facility] MD.  [PC]  [phone] Patient care turned over to oncoming provider. Patient case and results discussed in detail; please see their note for further ED managment.     [PC]    Clinical Course User Index [PC] Vannak Montenegro, Raynell Moder, MD   Medical Decision Making Amount and/or Complexity of Data Reviewed Labs: ordered. Decision-making details documented in ED Course. Radiology: ordered and independent interpretation performed. Decision-making details documented in ED Course.  Risk Prescription drug management.    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   Retinal hemorrhage 03/21/2023   Shortness of breath    Sickle cell anemia (HCC)    Tobacco dependence 01/24/2014   Vitamin D  deficiency 05/03/2014   Patient Active Problem List   Diagnosis Date Noted   Hospital discharge follow-up 03/21/2023   Anxiety 03/21/2023   Chronic pain syndrome 03/21/2023   Lymphadenopathy 03/21/2023   Retinal hemorrhage 03/21/2023   DVT, lower extremity, distal, acute, bilateral (HCC) 03/21/2023   Reactive thrombocytosis 03/02/2023   Aspiration pneumonia of both lungs (HCC) 02/27/2023   HAP (hospital-acquired pneumonia) 02/26/2023   Paradoxical embolism (HCC) 02/20/2023   Cerebrovascular accident (CVA) due to thrombosis of precerebral artery (HCC) 02/18/2023   AMS (altered mental status) 02/12/2023   Sepsis (HCC) 02/12/2023   Encephalopathy acute 02/12/2023   Acute respiratory failure with hypoxia (HCC) 02/12/2023   Community acquired pneumonia 02/12/2023   Protein-calorie malnutrition, severe 02/12/2023   Acute sickle cell crisis (HCC) 12/25/2020   Flail chest 12/25/2020   Left rib fracture 12/25/2020   Chronic prescription opiate use 06/18/2017   Vitamin D  deficiency 05/03/2014   Gastroesophageal reflux disease without esophagitis 01/24/2014   Tobacco dependence 01/24/2014   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 Authorizing Provider  albuterol  (VENTOLIN  HFA) 108 (90 Base)  MCG/ACT inhaler TAKE 2 PUFFS BY MOUTH EVERY 6 HOURS AS NEEDED FOR WHEEZE OR SHORTNESS OF BREATH 09/11/23   Paseda, Folashade R, FNP  apixaban  (ELIQUIS ) 5 MG TABS tablet Take 1 tablet (5 mg total) by mouth 2 (two) times daily. 03/26/23 05/25/23  Paseda, Folashade R, FNP  busPIRone  (BUSPAR ) 5 MG tablet TAKE 1 TABLET BY MOUTH TWICE A DAY 04/18/23   Paseda, Folashade R, FNP  folic acid  (FOLVITE ) 1 MG tablet Take 1 tablet (1 mg total) by mouth daily. 03/26/23   Paseda, Folashade R, FNP  HYDROcodone -acetaminophen  (NORCO/VICODIN) 5-325 MG tablet Take 1 tablet by mouth every 6 (six) hours as needed for severe pain (pain score 7-10). 05/04/23   Ruthell Lonni FALCON, PA-C  hydrocortisone  cream 1 % Apply 1 Application topically 2 (two) times daily. 03/03/23   Raenelle Coria, MD  naloxone  (NARCAN ) nasal spray 4 mg/0.1 mL Place 1 spray (4 mg total) into one nostril once as needed for overdose 03/21/23   Paseda, Folashade R, FNP  omeprazole  (PRILOSEC) 20 MG capsule Take 1 capsule (20 mg total) by mouth daily. 03/26/23   Paseda, Folashade R, FNP  oxyCODONE  (OXY IR/ROXICODONE ) 5 MG immediate release tablet Take 1 tablet (5 mg total) by mouth every 6 (six) hours as needed for severe pain (pain score 7-10). 03/25/23   Paseda, Folashade R, FNP  VITAMIN D  PO Take by mouth.    [provider]                                                                                                                                    Allergies Patient has no known allergies.  Review of Systems Review of Systems As noted in HPI  Physical Exam Vital Signs  I have reviewed the triage vital signs BP 128/88   Pulse (!) 101   Temp 98.3 F (36.8 C) (Oral)   Resp 17   SpO2 100%   Physical Exam Vitals reviewed.  Constitutional:      General: He is not in acute distress.    Appearance: He is well-developed. He is not diaphoretic.  HENT:     Head: Normocephalic and atraumatic.     Right Ear: Tympanic membrane normal.      Left Ear: Tympanic membrane normal.     Nose: Nose normal.     Mouth/Throat:     Pharynx: Oropharynx is clear.     Tonsils: No tonsillar exudate.  Eyes:     General: No scleral icterus.       Right eye: No discharge.        Left eye: No discharge.     Conjunctiva/sclera: Conjunctivae normal.     Pupils: Pupils are equal, round, and reactive to light.  Cardiovascular:     Rate and Rhythm: Normal rate and regular rhythm.     Heart sounds: No murmur heard.    No friction rub. No gallop.  Pulmonary:     Effort: Pulmonary effort is normal. No respiratory distress.     Breath sounds: Normal breath sounds. No stridor. No rales.  Abdominal:     General: There is no distension.     Palpations: Abdomen is soft.     Tenderness: There is no abdominal tenderness.  Musculoskeletal:        General: No tenderness.     Cervical back: Normal range of motion and neck supple.  Lymphadenopathy:     Head:     Right side of head: No submental, submandibular, preauricular, posterior auricular or occipital adenopathy.     Left side of head: Submandibular and posterior auricular adenopathy present. No submental, preauricular or occipital adenopathy.     Cervical: Cervical adenopathy present.     Right cervical: Deep cervical adenopathy and posterior cervical adenopathy present.     Left cervical: Deep cervical adenopathy and posterior cervical adenopathy present.     Upper Body:     Right upper body: Supraclavicular adenopathy present. No axillary, pectoral or epitrochlear adenopathy.     Left upper body: Axillary adenopathy present. No supraclavicular, pectoral or epitrochlear adenopathy.     Lower Body: Right inguinal adenopathy present. No left inguinal adenopathy.  Skin:    General: Skin is warm and dry.     Findings: No erythema or rash.  Neurological:     Mental Status: He is alert and oriented to person, place, and time.     ED Results and Treatments Labs (all labs ordered are listed, but  only abnormal results are displayed) Labs Reviewed  CBC WITH DIFFERENTIAL/PLATELET - Abnormal; Notable for the following components:      Result Value   WBC 25.3 (*)    RBC 4.11 (*)    Hemoglobin 12.0 (*)    HCT 34.3 (*)    RDW 18.8 (*)    Platelets 678 (*)    Neutro Abs 19.7 (*)    Monocytes Absolute 2.2 (*)    Eosinophils Absolute 1.5 (*)    Abs Immature Granulocytes 0.14 (*)    All other components within normal limits  COMPREHENSIVE METABOLIC PANEL WITH GFR - Abnormal; Notable for the following components:   Glucose, Bld 114 (*)    All other components within normal limits  RETICULOCYTES - Abnormal; Notable for the following components:   Retic Ct Pct 3.5 (*)    Immature Retic Fract 24.3 (*)    All other components within normal limits  SYPHILIS: RPR W/REFLEX TO RPR TITER AND TREPONEMAL ANTIBODIES, TRADITIONAL SCREENING AND DIAGNOSIS ALGORITHM  RAPID HIV SCREEN (HIV 1/2 AB+AG)  EBV AB TO VIRAL CAPSID AG PNL, IGG+IGM  QUANTIFERON-TB GOLD PLUS  EKG  EKG Interpretation Date/Time:    Ventricular Rate:    PR Interval:    QRS Duration:    QT Interval:    QTC Calculation:   R Axis:      Text Interpretation:         Radiology No results found.  Medications Ordered in ED Medications  iohexol  (OMNIPAQUE ) 300 MG/ML solution 75 mL (75 mLs Intravenous Contrast Given 03/31/24 0709)   Procedures Procedures  (including critical care time)   This chart was dictated using voice recognition software.  Despite best efforts to proofread,  errors can occur which can change the documentation meaning.   Trine Raynell Moder, MD 03/31/24 [phone]

## 2024-04-01 LAB — EBV AB TO VIRAL CAPSID AG PNL, IGG+IGM
EBV VCA IgG: >600 U/mL — ABNORMAL HIGH (ref 0.0–17.9)
EBV VCA IgM: <36 U/mL (ref 0.0–35.9)

## 2024-04-02 LAB — QUANTIFERON-TB GOLD PLUS (RQFGPL)
QuantiFERON Mitogen Value: 0.26 [IU]/mL
QuantiFERON Nil Value: 0 [IU]/mL
QuantiFERON TB1 Ag Value: 0.12 [IU]/mL
QuantiFERON TB2 Ag Value: 0.15 [IU]/mL

## 2024-04-02 LAB — QUANTIFERON-TB GOLD PLUS: QuantiFERON-TB Gold Plus: UNDETERMINED — AB

## 2024-04-11 ENCOUNTER — Emergency Department (HOSPITAL_COMMUNITY)
Admission: EM | Admit: 2024-04-11 | Discharge: 2024-04-12 | Attending: Emergency Medicine | Admitting: Emergency Medicine

## 2024-04-11 ENCOUNTER — Encounter (HOSPITAL_COMMUNITY): Payer: Self-pay

## 2024-04-11 ENCOUNTER — Other Ambulatory Visit: Payer: Self-pay

## 2024-04-11 ENCOUNTER — Emergency Department (HOSPITAL_COMMUNITY)

## 2024-04-11 DIAGNOSIS — Z5321 Procedure and treatment not carried out due to patient leaving prior to being seen by health care provider: Secondary | ICD-10-CM | POA: Diagnosis not present

## 2024-04-11 DIAGNOSIS — R590 Localized enlarged lymph nodes: Secondary | ICD-10-CM | POA: Insufficient documentation

## 2024-04-11 DIAGNOSIS — R0602 Shortness of breath: Secondary | ICD-10-CM | POA: Insufficient documentation

## 2024-04-11 LAB — CBC WITH DIFFERENTIAL/PLATELET
Abs Immature Granulocytes: 0.08 K/uL — ABNORMAL HIGH (ref 0.00–0.07)
Basophils Absolute: 0.2 K/uL — ABNORMAL HIGH (ref 0.0–0.1)
Basophils Relative: 1 %
Eosinophils Absolute: 3 K/uL — ABNORMAL HIGH (ref 0.0–0.5)
Eosinophils Relative: 14 %
HCT: 33.2 % — ABNORMAL LOW (ref 39.0–52.0)
Hemoglobin: 11.3 g/dL — ABNORMAL LOW (ref 13.0–17.0)
Immature Granulocytes: 0 %
Lymphocytes Relative: 7 %
Lymphs Abs: 1.5 K/uL (ref 0.7–4.0)
MCH: 27.8 pg (ref 26.0–34.0)
MCHC: 34 g/dL (ref 30.0–36.0)
MCV: 81.8 fL (ref 80.0–100.0)
Monocytes Absolute: 1.8 K/uL — ABNORMAL HIGH (ref 0.1–1.0)
Monocytes Relative: 8 %
Neutro Abs: 15.4 K/uL — ABNORMAL HIGH (ref 1.7–7.7)
Neutrophils Relative %: 70 %
Platelets: 407 K/uL — ABNORMAL HIGH (ref 150–400)
RBC: 4.06 MIL/uL — ABNORMAL LOW (ref 4.22–5.81)
RDW: 19.5 % — ABNORMAL HIGH (ref 11.5–15.5)
WBC: 21.9 K/uL — ABNORMAL HIGH (ref 4.0–10.5)
nRBC: 0 % (ref 0.0–0.2)

## 2024-04-11 LAB — COMPREHENSIVE METABOLIC PANEL WITH GFR
ALT: 8 U/L (ref 0–44)
AST: 39 U/L (ref 15–41)
Albumin: 3.6 g/dL (ref 3.5–5.0)
Alkaline Phosphatase: 106 U/L (ref 38–126)
Anion gap: 13 (ref 5–15)
BUN: 10 mg/dL (ref 6–20)
CO2: 25 mmol/L (ref 22–32)
Calcium: 9.7 mg/dL (ref 8.9–10.3)
Chloride: 100 mmol/L (ref 98–111)
Creatinine, Ser: 0.7 mg/dL (ref 0.61–1.24)
GFR, Estimated: >60 mL/min
Glucose, Bld: 106 mg/dL — ABNORMAL HIGH (ref 70–99)
Potassium: 4 mmol/L (ref 3.5–5.1)
Sodium: 139 mmol/L (ref 135–145)
Total Bilirubin: 0.5 mg/dL (ref 0.0–1.2)
Total Protein: 8 g/dL (ref 6.5–8.1)

## 2024-04-11 NOTE — ED Triage Notes (Signed)
 Continues to have lymphnode swelling, shortness of breath and for feeling good for several months.   Being followed by [facility] for same.

## 2024-04-12 NOTE — ED Notes (Signed)
 Patient not in the lobby to obtain new set of vitals.

## 2024-04-26 NOTE — Discharge Summary (Signed)
 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 *  Primary Diagnosis: Admitted for expedited workup and treatment of lymphadenopathy c/f malignancy  Changes Made (with rationale):  Started ppx Bactrim, acyclovir , and cefdinir  To-Do List (incidental findings, follow-up studies, etc.): Follow 1/4 FISH B cell lymphoma panel Per Sickle Cell team, monitor hemoglobin electrophoresis Q4weeks, goal to keep S + C < 30%, transfusion threshold Hgb < 9  Anticipatory Guidance for Outpatient Care:  Per Sickle Cell team, minimum transfusion requirement of 2 units irradiated PRBCs monthly + PRN with threshold of Hgb < 9, pre-medicate with Tylenol  and Benadryl PO (recs in Hematology progress note 1/13) Patient will require apheresis port for resumption of exchange transfusions when SVC compression 2/2 extensive lymphadenopathy is improved Avoid GCSF in sickle cell patients    Results Pending at Discharge:  Pathology: 1/4 B cell lymphoma panel Please see phone numbers at end of this summary for lab contact information.   Follow-up/Care Transition Plan: Sched. appts: Future Appointments  Date Time Provider Department Center  05/05/2024  8:40 AM STUDIES/LAB-BCC [facility]  05/05/2024  9:30 AM LAB AND WAIT CLINIC [facility]  05/14/2024  7:55 AM STUDIES/LAB-BCC [facility]  [facility]  05/14/2024  9:00 AM Rosario, Summer, NP [facility]  05/14/2024  9:45 AM HEM MALIG PROVIDER [facility]  05/20/2024  8:00 AM SICKLE CELL LAB HEMATOLOGY [facility]  05/20/2024  9:00 AM Dep, Karolynn Batters, NP HEMATOLOGY [facility]    Follow-up info: No follow-up provider specified.     Allergies/Intolerances:  No Known Allergies   New Adverse Drug Events: none  Medications:     Current Discharge Medication List     START taking these medications      Instructions  acyclovir  200 MG capsule Quantity: 60 capsule Refills: 1  Commonly known as: ZOVIRAX  Take 2 capsules (400 mg total) by mouth every 12 (twelve) hours Last time this was given: 400 mg on April 27, 2024  8:11 AM   cefdinir 300 mg capsule Quantity: 60 capsule Refills: 0  Commonly known as: OMNICEF Take 1 capsule (300 mg total) by mouth every 12 (twelve) hours for 30 days Last time this was given: 300 mg on April 27, 2024  8:11 AM   ondansetron  4 MG disintegrating tablet Quantity: 20 tablet Refills: 0 Stop taking on: May 04, 2024  Commonly known as: ZOFRAN -ODT Take 1 tablet (4 mg total) by mouth every 8 (eight) hours as needed for up to 7 days Last time this was given: Ask your nurse or doctor   polyethylene glycol packet Refills: 0  Commonly known as: MIRALAX  Take 1 packet (17 g total) by mouth once daily as needed for up to 30 days Mix in 4-8ounces of fluid prior to taking.   SENNA 8.6 mg tablet Quantity: 120 tablet Refills: 0 Generic drug: sennosides  Take 2 tablets by mouth 2 (two) times daily as needed for Constipation for up  to 30 days Last time this was given: 2 tablets on April 27, 2024  8:25 AM   sulfamethoxazole-trimethoprim 800-160 mg tablet Quantity: 12 tablet Refills: 1 Start taking on: April 28, 2024  Commonly known as: BACTRIM DS Take 1 tablet (160 mg of trimethoprim total) by mouth every Monday, Wednesday, and Friday for 60 days Last time  this was given: 160 mg of trimethoprim on April 26, 2024  9:13 AM       CONTINUE taking these medications      Instructions  albuterol  MDI (PROVENTIL , VENTOLIN , PROAIR ) HFA 90 mcg/actuation inhaler Refills: 0  Inhale 2 Inhalations into the lungs every 6 (six) hours as needed   apixaban  5 mg tablet Quantity: 60 tablet Refills: 3  Commonly known as: ELIQUIS  Take 1 tablet (5 mg total) by mouth 2 (two) times daily for 60 days Last time this was given: 5 mg on April 27, 2024  8:11 AM   busPIRone  5 MG tablet Refills: 0  Commonly known as: BUSPAR  Take 5 mg by mouth 2 (two) times daily Last time this was given: 5 mg on April 26, 2024  5:32 PM   cholecalciferol 2,000 unit capsule Quantity: 60 capsule Refills: 6  Commonly known as: VITAMIN D3 Take 1 capsule (2,000 Units total) by mouth 2 (two) times daily Last time this was given: 50,000 Units on April 26, 2024  9:13 AM   folic acid  1 MG tablet Quantity: 90 tablet Refills: 3  Commonly known as: FOLVITE  Take 1 tablet (1 mg total) by mouth once daily Last time this was given: 1 mg on April 27, 2024  8:11 AM   hydrocortisone  1 % cream Refills: 0  Apply 1 Application topically 2 (two) times daily   naloxone  4 mg/actuation nasal spray Quantity: 1 each Refills: 2  Commonly known as: NARCAN  Place 1 spray (4 mg total) into one nostril once as needed (if not breathing.) for up to 1 dose 1 spray (4 mg total) by Nasal route once as needed (For medication overdose- use if patient unconscious, unresponsive, slow/shallow breathing) for up to 1 dose. 1 spray and call 911 IMMEDIATELY, repeat 1 spray if no change after 5 minutes   omeprazole  40 MG DR capsule Quantity: 30 capsule Refills: 6  Commonly known as: PriLOSEC Take 1 capsule (40 mg total) by mouth once daily   oxyCODONE  10 mg immediate release tablet Quantity: 90 tablet Refills: 0 Stop taking on: May 10, 2024  Commonly known as: OxyIR Take 1 tablet (10 mg total)  by mouth every 6 (six) hours as needed for Pain (For moderate to severe pain) for up to 28 days Doctor's comments: Please give the tablet. Patient cannot take gel caps. May fill on 04/13/2024 Last time this was given: 10 mg on April 27, 2024  4:57 AM   triamcinolone  0.5 % ointment Quantity: 30 g Refills: 2  Apply topically 2 (two) times daily as needed Apply topically 2 times daily for 10 days.         Brief History of Present Illness:  Peter Kline is a 59 y.o. male with a history of sickle cell disease c/b stroke and chronic pain, GERD, COPD and smoking presenting from hematology clinic for [facility] exchange, currently hospitalized for expedited workup and treatment of lymphadenopathy c/f malignancy.  _____________________   Hospital Course by Problem:  High grade B cell lymphoma Risk for SVC syndrome Patient first noticed lymphadenopathy 1 year ago. Since that time his lymphadenopathy has progressed particularly  in the last 2-3 weeks and is located in cervical, occipital, axillary and inguinal regions. CT PE 1/2 with extensive mediastinal lymphadenopathy; marked mass effect with pronounced compression of SVC and R subclavian vein. 1/4 excisional biopsy of right groin lymph node with aggressive large B cell lymphoma, non-germinal center type. PET 1/6 with extensive multi station intense hypermetabolic lymphadenopathy above and below the diaphragm, concerning for high grade lymphoma; few hypermetabolic osseous lesions, concerning for lymphomatous involvement. Echo 1/8 with EF >55%. Given SVC compression by his lymphadenopathy, there was initial concern that he could develop SVC syndrome. There was no clinical evidence of SVC syndrome and he remained hemodynamically and clinically stable. However, with reports of difficulty eating and change in voice, he received steroids  (1 mg/kg prednisone) 1/6-1/7 with no evidence of TLS. On 1/8 R groin pathology revealed aggressive large B-cell lymphoma  (non-germinal center type, high proliferation index 90%, MYC expression). Given diffuse bulky lymphadenopathy, he is stage IVX with ECOG 1 and smIPI high risk. On day of discharge he is C1D6 R-CHOP (split-dosing D1-2). He continues on prophylactic acyclovir  and Bactrim DS, as well as cefdinir for atrophic/afunctional spleen. He is scheduled for labs at [facility] on 1/21 and labs/return visit on 1/30, appointments provided to patient.   Aspiration risk Seen by SLP, recommended diet IDDSI L4 with L3 liquids. Patient understands the risks of not following this diet and has capacity to chose his own diet, able to verbalize risk for aspiration pneumonia. Declined dysphagia diet while admitted. SLP signed off. Per patient, no further coughing with eating and drinking since initiating chemotherapy.    Sickle cell disease c/b Stroke c/b DVT c/b priapism Anemia Chest x ray on admission less concerning for acute chest. ECG and troponins on admission not consistent with ACS iso sickle cell. Hb A% 58.6, Hb A2% 3.7, HbS% 20.5, Hb C% 17.2. Anemia labs demonstrate Vitamin D  deficiency and iron deficiency; receiving Vit D replacement, iron supplementation deferred given sickle cell disease at risk for chronic transfusions and iron overload. Most recent exchange transfusion 1/7. Per Sickle Cell team, patient is not currently able to continue red cell exchanges any longer without having a port and is not a candidate for apheresis port placement at this time due to SVC compression from extensive mediastinal lymphadenopathy. Plan for eventual resumption of exchange transfusions when repeat imaging demonstrates improvement in SVC compression/line placement is appropriate. In the interim, recommendations are to continue simple transfusions with 2 units monthly and as needed for Hgb 9 or below with irradiated sickle negative PRBC sickle cell protocol and pre-medication with Tylenol  and Benadryl PO. Continues on Vit D3, folate, and  apixaban  5 mg BID. At time of discharge patient is scheduled for follow-up with Sickle Cell clinic on 2/5 and for labs/transfusions at the [facility] next on 1/21.  COPD Smoking Patient current smoker. Not interested in smoking cessation consult at this time. Received nicotine  patches while admitted. Continued pm home PRN albuterol .   GERD Received pantoprazole while inpatient, resumed home omeprazole  on discharge    Anxiety/Depression Continued on home buspirone  5mg  BID.  History of Hep C HCV Ab reactive 10/02/2013 with undetectable VL per notes, c/w prior infection with spontaneous clearance. HCV Ab now negative. HCV PCR 1/9 nonreactive.   Bladder wall thickening CT 1/2 demonstrated mild asymmetric right lateral bladder wall thickening may be reactive or represent an underlying mass/invasion. Plan for outpatient cystoscopy.    Severe protein calorie malnutrition In the context of chronic illness. Nutrition followed throughout hospitalization,  regular diet maintained. Patient declined ONS during admission. Continued on Vit D3 dosing (50,000IU weekly x8wks); will need recheck when dosing is complete.       ___________________________________________________   Hospital Synopsis: 1/2-8: See Gen Med Progress notes.  1/8: C1D1 R-CHOP. 1/9: C1D2 R-CHOP. TLS protocol w/ q6h labs, tele, and IVF. Start cefdinir.  1/10: C1D3 R-CHOP. Stable. TLS labs q12h. D/c tele, IVF.  1/11: C1D4 R-CHOP. Stable. 1/12: C1D5 R-CHOP. Stable.  1/13: C1D6 R-CHOP. Discharge to home.     Malnutrition: He has been diagnosed with severe protein-calorie malnutrition in the context of chronic illness, based on >7.5% unintentional weight loss in 3 months, energy intake meeting < or equal to 75% of estimated requirement for > or equal to 1 month.  - Recommend oral nutrition, Recommend oral nutrition supplements, Recommend trend weight status.   Social Drivers of Health with Concerns   Tobacco Use: High Risk (04/16/2024)    Patient History    Smoking Tobacco Use: Every Day    Smokeless Tobacco Use: Never    Passive Exposure: Current  Transportation Needs: No Transportation Needs (04/18/2024)   PRAPARE - Transportation    Lack of Transportation (Medical): No    Lack of Transportation (Non-Medical): No  Recent Concern: Transportation Needs - Unmet Transportation Needs (01/28/2024)   PRAPARE - Transportation    Lack of Transportation (Medical): Yes    Lack of Transportation (Non-Medical): Patient unable to answer  Depression: Moderately severe depression (04/16/2024)   PHQ-9    PHQ-9 Score: 16    Surgeries and Procedures Performed:  Procedure(s): BIOPSY OR EXCISION OF LYMPH NODE(S); OPEN, INGUINOFEMORAL NODE(S) _____________________  Discharge Exam:  BP 132/89 (BP Location: Left upper arm, Patient Position: Lying)   Pulse 83   Temp 36.7 C (98 F) (Oral)   Resp 20   Ht 173 cm (5' 8.11)   Wt 75.5 kg (166 lb 7.2 oz)   SpO2 99%   BMI 25.23 kg/m      GEN: Well-appearing male, sitting up in bed, in no acute distress HEENT: EOMI, MMM, OP clear, no mucositis appreciated NECK:  Supple, no JVD, + cervical and subclavicular lymphadenopathy (improving) LUNGS:  CTAB, no W/R/R CV:  RRR, no M/R/G ABD:  Soft, NT/ND, BS+, no masses or organomegaly EXT:  Warm, NT, no C/C/E, no edema SKIN:  No rash or lesions appreciated, s/p R inguinal excision with well-healing site open to air NEURO:  A&Ox3, CN II-XII intact grossly, no gross focal deficit  Pertinent Lab Testing: Recent Labs  Lab 04/25/24 0422 04/26/24 0544 04/27/24 0420  NA 135 136 137  K 3.6 3.5 3.6  CL 100 97* 100  CO2 28 30 31*  BUN 13 12 12   CREATININE 0.7 0.6 0.7  GLUCOSE 113 89 99  CALCIUM 8.7 8.9 8.9   Recent Labs  Lab 04/21/24 0618 04/23/24 0310 04/26/24 0544  AST 36 36 24  ALT 16 16 25   ALKPHOS 79 75 79  TBILI 0.6 0.6 0.7    Recent Labs  Lab 04/25/24 0422 04/26/24 0544 04/27/24 0420  WBC 19.4* 11.6* 7.6  HGB 8.2*  8.7* 9.3*  HCT 24.3* 25.9* 27.9*  PLT 295 378 391   No results for input(s): APTT, INR in the last 168 hours.   Other Pertinent Labs:  None  Micro:  Lab Results  Component Value Date   BLDCULT No growth detected. 04/16/2024   BLDCULT No growth detected. 04/16/2024       Pertinent Imaging:   Echo complete  Result Date: 04/21/2024               [facility]                         JASON FRISBEE                           [facility]                              [id number]                                                                               DOB: Jun 26, 1965  Age: 53                  ECHO-DOPPLER REPORT                             Date: 04/21/2024                                                                                  Male                                                                                      Inpatient                                                                       LOCATION: [place] [facility]  MD1: DEBBY SATTERFIELD                                                                           HO[place]AND                      STUDY: ECHO COMPLETE                             SOUND QLTY: Excellent                     ECHO: Yes                                           STRAIN: Yes                          COLOR: Yes                                               3D: Yes                        DOPPLER: Yes                                               BP: 139 / 83                 RV BIOPSY: No                                                HR: 91 BPM                    CONTRAST: No                                            Height: 71 in                       MEDIUM: N/A                                           Weight: 172 lbs                    MACHINE: CDU - E95-13  BSA: 2.0                   ------------------------------------------------------------------------------------------     History: Chemotherapy     Reason: Assess LV function  Indication: R59.1- Generalized enlarged lymph nodes.                                            CONCLUSION ------------------------------------------------------------------------------- NORMAL LEFT VENTRICULAR SYSTOLIC FUNCTION WITH NO LVH ESTIMATED EF: >55%, CALC EF(3D): 55% NORMAL LA PRESSURES WITH NORMAL DIASTOLIC FUNCTION NORMAL RIGHT VENTRICULAR SYSTOLIC FUNCTION VALVULAR REGURGITATION: No AR, TRIVIAL MR, TRIVIAL PR, TRIVIAL TR                        ESTIMATED RVSP: 28 mmHg (Normal) NO VALVULAR STENOSIS                                                                      NO PRIOR ECHO FOR COMPARISON                                                                                          ECHOCARDIOGRAPHIC DESCRIPTIONS ----------------------------------------------------------- AORTIC ROOT         Asc Ao Size: Normal                               Dissection: INDETERMINATE FOR DISSECTION                                             AORTIC VALVE            Leaflets: Tricuspid                               Mobility: Fully Mobile                    Morphology: Normal                                                                                    AR: No AR  AS: No AS                              AV Mass: No Masses                   LEFT VENTRICLE                Size: Normal                                                                                   LVH: None                                Contraction: Normal                               Closest EF: >55%                                    Calc. EF: 55%(3D)                        LV GLS (GE): -14.2%   Normal Range <-18%     Strain Analysis: GLS > -16% Global longitudinal strain is abnormal. As an adjunctive                         assessment of  myocardial function, this strain value is consistent                          with subclinical impairment of myocardial contractility in the                              setting of preserved EF.                                                           LV Mass: No Masses                         Dias. FxClass: Normal                                                                   WALL MOTION                            Basal  Mid               Apical              Anterior Septum: Normal            Normal            Normal                Anterior Wall: Normal            Normal            Normal                 Lateral Wall: Normal            Normal            Normal               Posterior Wall: Normal            Normal                                  Inferior Wall: Normal            Normal            Normal              Inferior Septum: Normal            Normal                            Rest Rest Score Index: 1.00 MITRAL VALVE            Leaflets: Normal                                  Mobility: Fully Mobile                    Morphology: THICKENED LEAFLET(S)                                                                       MR: TRIVIAL MR                                    MS: No MS                            MV masses: No Masses                   LEFT ATRIUM                Size: Normal  LA masses: No Masses                   MAIN PA                Size: MILDLY DILATED                          Diameter: 2.4 cm                 PULMONIC VALVE            Leaflets: UNKNOWN                                 Mobility: Fully Mobile                    Morphology: Normal                                                           PR: TRIVIAL PR                                    PS: No PS                            PV masses: No Masses                   RIGHT VENTRICLE                Size: Normal                                 Free  Wall: Normal                         Contraction: Normal                                                        TAPSE: 3.2 cm                                                    RV masses: No Masses                   TRICUSPID VALVE            Leaflets: Normal                                  Mobility: Fully Mobile                    Morphology: Normal  TR: TRIVIAL TR                                    TS: No TS                            TV masses: No Masses                   RIGHT ATRIUM                Size: Normal                                RA masses: No Masses                   PERICARDIUM               Fluid: TRIVIAL FLUID                                     INFERIOR VENA CAVA                Size: Normal                                 Max Diam: 1.6 cm                                  Min Diam: 0.3 cm                      Percent Change: 83 %                                                                       Resp.Collapse: Normal Respiratory Collapse                                          RESTING ECHOCARDIOGRAPHIC MEASUREMENTS --------------------------------------------------- AORTA Measurements            Values    Units     Normal Range                              Aorta Sin: 3         cm        [2.8 - 4.0]                               Asc.Aorta: 3.4       cm        [2.2 - 3.8]  Asc. Aorta BSA: 1.7       cm/m2     [1.1 - 1.9]                  LEFT VENTRICLE                  LVIDd: 5.3       cm        [4.2 - 5.8]                                   LVIDs: 4.2       cm        [2.5- 4]                                  LVIDd/BSA: 2.7       cm/m2                                                     SWT: 1         cm        [0.6 - 1]                                       PWT: 0.9       cm        [0.6 - 1]                                  LV EF 3D: 55        %                                                   LV EDV 3D: 126       mL                                                  LV EDV 3Di: 64        mL/m2                                               LV ESV 3D: 57        mL                                                 LV ESV 3Di: 29        mL/m2  DIASTOLIC FUNCTION          MV Pk. E Vel.: 58.2      cm/s                                            MV Pk. A Vel.: 88.7      cm/s                                                   MV E/A: 0.7                                                               MV DT: 127       msec                                           MV Med E' Vel.: 8.9       cm/s                                              MV Med E/e': 6.5                                                       MV Lat E'Vel.: 10.3      cm/s                                              MV Lat E/e': 5.7                                                         MV Avg E/e': 6.1       cm/s                                   LEFT ATRIUM                LA Diam: 4.3       cm        [3 - 4]                                     LA Area: 20.5      cm2       [<=  20]                                   LA Volume: 65        ml        [18 - 58]                                      LAVi: 33        ml/m2     [16 - 34]                    RIGHT VENTRICLE               RV TAPSE: 3.2       cm                                                 RV S' Vel.: 26.6      cm/s                                                  RV Base: 4.2       cm        [2.5 - 4.1]                                  RV Mid: 3.2       cm        [1.9 - 3.5]                  RIGHT ATRIUM                RA Area: 21.4      cm2       [ <= 20]                                  RA Volume: 63        mL                                                       RAVi: 32        mL/m2     [11 - 39]                    Pressures, Gradients, and DOPPLER ECHO --------------------------------------------------- Mitral Valve          MV Pk. E Vel.: 58.2      cm/s                                            MV  Pk. A Vel.: 88.7  cm/s                                                   MV E/A: 0.7                                                    MV Inflow E Vel.: 58.2      cm/s                                        MV Annulus E'Vel.: 8.9       cm/s                                                E/E'Ratio: 7                                                Tricuspid Regurgitation Values            TR Pk. Vel.: 2.5       m/s                                               RA Pressure: 3         mmHg                                                     RVSP: 28        mmHg      Peak                         3D acquisition and reconstructions were performed as part of this examination to more accurately quantify the effects of pre/post chemo or radiation therapy with independent workstation.        Perform By: Eleanor Bless, RDCS                                                    Res. Person: Eleanor Bless, RDCS                                              Electronically signed by Donzell CHRISTELLA Mort, M.D. on:04/21/2024 7:04:20 PM with status of Final The images are stored in the [facility] system, please contact  the clinical provider for images related to this study.                                                                      PET SBMT with nondiagnostic concurrent CT initial FDG (F18 fluorodeoxyglucose) Result Date: 04/20/2024 Procedure: FDG PET SBMT W NONDIAGNOSTIC CONCURRENT CT INITIAL Indication: 59 years Male lymphadenopathy, R59.1 Generalized enlarged lymph nodes. Radiotracer: 12.69 mCi F18-FDG, intravenously. Technique: PET imaging was performed from the skull base to the mid-thigh following intravenous administration of radiotracer. A single breath-hold CT scan was performed at quiet end-expiration for anatomic localization and attenuation correction. Serum glucose: 92 mg/dL Uptake time: 60 min Comparison studies: CT chest April 16, 2024. CT abdomen pelvis April 16, 2024. FINDINGS: Visualized Head & Neck: - Lymph  Nodes: Extensive bilateral cervical hypermetabolic lymphadenopathy. For reference there is a hypermetabolic left neck nodal conglomerate measuring approximately 8.7 x 3.5 cm (image 49). - Thyroid: No suspicious tracer uptake. - Other: No suspicious tracer uptake. Chest: - Lymph Nodes: Extensive hypermetabolic mediastinal, hilar, and bilateral axillary lymphadenopathy. For reference there is a hypermetabolic left axillary node measuring approximately 4.9 x 2.7 cm (image 108). There is also a subcarinal node which measures up to 2.8 cm in short axis (image 107). There is also a confluent nodal conglomerate involving the right mediastinal space measuring approximately 10.1 x 6.4 cm (image 95). - Lungs & Pleura: No suspicious tracer uptake. Bibasilar dependent atelectasis with trace bilateral pleural effusions.. - Heart & Pericardium: No suspicious tracer uptake. Abdomen & Pelvis: - Lymph Nodes: Extensive hypermetabolic retroperitoneal, right pelvic and right inguinal lymphadenopathy. For reference there is a aortocaval nodal conglomerate which measures approximately 3.7 x 6.0 cm (image 177). Additional reference right pelvic sidewall nodal conglomerate measures approximately 9.9 x 4.2 cm (image 232). Reference hypermetabolic right inguinal node measures approximately 5.2 x 2.8 cm (image 261). There is associated postbiopsy changes in the right groin with subcutaneous gas. - Liver:  No focal hypermetabolic activity above background liver. - Biliary & Gallbladder: The gallbladder contains gallstones. - Spleen: Decreased size and diffuse calcifications. No focal hypermetabolic lesions. Normal uptake relative to liver - Pancreas: No suspicious tracer uptake. - Adrenal Glands: No suspicious tracer uptake. - Kidneys: Normal urinary excretion without hydronephrosis. No hypermetabolic masses. - Genitourinary: No suspicious tracer uptake. - Vasculature: No suspicious tracer uptake. - Gastrointestinal: Physiologic uptake without  suspicious focal uptake. - Peritoneum: No suspicious tracer uptake. Musculoskeletal: - Bones: Focal hypermetabolic activity in the posterior calvarium (image 15 of the dedicated neck images). Focal hypermetabolic activity posterior aspect of the L4 vertebral body (image 194). - Soft Tissues: No suspicious hypermetabolic lesions. Scattered punctate metallic densities throughout the soft tissues of the bilateral lower thighs and right gluteal region. IMPRESSION: 1.  Extensive multi station intense hypermetabolic lymphadenopathy above and below the diaphragm, concerning for high-grade lymphoma. Correlate with biopsy results. 2.  Few hypermetabolic osseous lesions, concerning for lymphomatous involvement. FDG Uptake scale:  - minimal - uptake similar or less than blood pool  - mild - uptake greater than blood pool but less than or similar to liver  - moderate - uptake mildly greater than liver  - intense - uptake much greater than liver, similar to brain cortex Electronically Reviewed by:  Rockey Sane, MD, [facility] Electronically Reviewed on:  04/20/2024 4:26 PM I have reviewed the images and concur with the above findings. Electronically Signed by:  Marcene Shams, MD, [facility] Electronically Signed on:  04/20/2024 4:46 PM  X-ray fluoroscopy video swallow with speech Result Date: 04/19/2024 Video Swallow Study Patient date of birth: 12-Jan-1966 Clinical history and indication: SLP evaluation evidence of aspiration, R06.00 Dyspnea, unspecified Comparison: None Technique: Swallow study was performed under fluoroscopic guidance with videography.  Multiple consistencies of barium was administered orally by Speech Pathology. 40 mL barium nectar, 60 mL barium honey, and 40 mL barium pudding. Findings/Impression: 1.  Large amount of silent aspiration with barium nectar. 2.  No evidence of penetration or aspiration with barium honey, barium puree, or barium cookie. Please see separate report and evaluation of functional  videography images by Speech Pathology. Electronically Reviewed by:  Mabel Mans, MD, [facility] Electronically Reviewed on:  04/19/2024 11:05 AM I have reviewed the images and concur with the above findings. Electronically Signed by:  Iris CINDERELLA Friends, MD, [facility] Electronically Signed on:  04/19/2024 11:25 AM  CT abdomen pelvis with contrast Result Date: 04/16/2024 CT abdomen and pelvis with IV contrast Comparison:  None available. Indication:  R inguinal LAD cf malignancy, R06.00 Dyspnea, unspecified. Technique:  CT imaging was performed of the abdomen and pelvis following the administration of intravenous contrast.  Iodinated contrast was used due to the indications for the examination, to improve disease detection and further define anatomy. Coronal and sagittal reformatted images were generated and reviewed. Findings: - Lower Thorax: Please see same day separately dictated CT chest for findings above the diaphragm. - Liver: Normal in morphology and enhancement.  No suspicious hepatic masses are identified.  The portal and hepatic veins are patent. - Biliary and Gallbladder: No intrahepatic or extrahepatic bile duct dilatation. Cholelithiasis. 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 - Pancreas: Normal in appearance. - Adrenal Glands: Minimal left adrenal thickening. - Kidneys: Symmetric size and enhancement of the bilateral kidneys. No suspicious renal lesions. No hydronephrosis. - Abdominal and Pelvic Vasculature: No abdominal aortic aneurysm. - Gastrointestinal Tract: No abnormal dilation or wall thickening. - Peritoneum/Mesentery/Retroperitoneum: Presacral fluid/soft tissue thickening.  No free intraperitoneal air. - Lymph Nodes: Extensive retroperitoneal and right pelvic lymphadenopathy which exerts mass effect and mild narrowing on the inferior vena cava. For instance, there is a reference right inguinal lymph node measuring up to 2.9 cm.  Additional reference right external iliac chain lymph node measures up to 3.3 cm in short axis (series 5 image 123). Furthermore, there is a reference para-aortic lymph node measuring up to 2.8 cm (series 5 image 68). - Bladder: Mild asymmetric right lateral bladder wall thickening (series 7 image 38). - Pelvic Organs: Unremarkable. - Body Wall: Metallic densities are noted in the right gluteal/posterior thigh soft tissues. - Musculoskeletal:  No aggressive appearing osseous lesions. Impression: 1.  Extensive right groin and retroperitoneal lymphadenopathy is suspicious for lymphoproliferative malignancy. Recommend soft tissue sampling. 2.  Mild asymmetric right lateral bladder wall thickening may be reactive or represent an underlying mass/invasion. Recommend cystoscopy for further evaluation. Electronically Reviewed by:  Mabel Satterfield, MD, [facility] Electronically Reviewed on:  04/16/2024 2:23 PM I have reviewed the images and concur with the above findings. Electronically Signed by:  Redell Dawn, MD, [facility] Electronically Signed on:  04/16/2024 2:31 PM  CT neck soft tissue with contrast Result Date: 04/16/2024 CT NECK WITH CONTRAST INDICATION:  Lymphadenopathy, neck, lymphadenopathy, R06.00 Dyspnea, unspecified COMPARISON: None. TECHNIQUE/PROTOCOL: Standard postcontrast neck CT axial images obtained with coronal and sagittal reformats generated. FINDINGS: Lymph Nodes: Diffuse adenopathy involving the bilateral neck and mediastinum. Several nodes measure greater than 5 cm in dimension. Suprahyoid Neck: The nasopharynx, oropharynx, oral cavity, parapharyngeal space, and retropharyngeal space are normal. Infrahyoid Neck: The larynx and hypopharynx are normal. Salivary Glands: The parotid and submandibular glands are normal. Thyroid: Normal. Brain and Skull Base: The visualized portions of the brain and skull base are normal. Orbits: Normal. Paranasal Sinuses: Normal. Partially Visualized Thorax:  Please see same day CT chest report for additional details. Vasculature: The visualized major vessels of the neck are normal. Bones: No lytic or blastic osseous lesions. Additional Findings: None. IMPRESSION: Diffuse neck adenopathy, slightly greater on the left. Findings are nonspecific but may relate to hematologic malignancy or inflammatory adenopathy. Electronically Signed by:  Lynwood Spearman, MD, [facility] Electronically Signed on:  04/16/2024 2:23 PM  CT chest PE protocol incl CT angiogram chest w wo contrast Result Date: 04/16/2024 Procedure: CT CHEST PE  PROTOCOL INCL CT CHEST ANGIOGRAM W WO CONTRAST Indication:  assess for PE and PNA, R06.00 Dyspnea, unspecified. Clinical suspicion for pulmonary embolism. Comparison Exams:  Chest radiograph 04/16/2024 Technique:  Chest CTA PE Protocol.  Contiguous 1.25 mm axial images were obtained from the neck base through the upper abdomen following intravenous administration of iodinated contrast material.  If IV contrast material had not been administered, the likelihood of detecting abnormalities relevant to the patient's condition would have been substantially decreased.  3-D reconstructions were likewise performed and indicated to increase the sensitivity of detecting clinically relevant pathology. Findings:  Adequate contrast opacification of the pulmonary arteries without evidence of pulmonary embolism. There is a large mediastinal mass predominantly involving the anterior middle mediastinum which likely represents confluent lymph nodes. Given the extent of disease process is difficult to measure, however the mass in the right paratracheal region measures approximately 74 x 66 mm (image 186 series 8). The nodes are predominantly homogeneous in appearance and, encircling as well as displacing the central arteries and veins. The SVC in particular is markedly displaced to the right with pronounced narrowing (image 155). There is likewise some stenosis of the right  subclavian vein as it crosses the first rib with downstream venous dilation (image 93). In addition to marked mediastinal lymphadenopathy there is pronounced supraclavicular, axillary, and left subpectoral lymphadenopathy with nodes measuring up to 28 mm in short axis (image 138). The central airways are patent. Lung volumes are reduced secondary to expiratory technique. There is extensive interlobular septal thickening with groundglass opacities. Bilateral lower lobe predominant airway thickening is also present. Pleural spaces are normal. Neck base demonstrates mass effect on the thyroid from regional lymph nodes. The heart is moderately enlarged. Coronary arteries are normal in origin and course with no appreciable atherosclerotic calcifications. No pericardial effusion. No suspicious lytic or sclerotic osseous lesions. Impression: 1. No evidence of pulmonary embolism. 2. Extensive mediastinal lymphadenopathy resulting in confluent masses most suggestive of lymphoma. There is associated supraclavicular, subpectoral, and axillary lymphadenopathy. 3. Marked mass effect with pronounced compression on the SVC as well as the right subclavian vein. The vessels are currently patent but at risk of obstruction. 4. Pulmonary edema and subsegmental atelectasis. 5. See same day CT of the abdomen, pelvis, and neck for additional findings. Electronically Signed by:  Emeline Ni, MD, [facility] Electronically Signed on:  04/16/2024 2:05 PM  X-ray chest PA and lateral Result  Date: 04/16/2024 Examination:  XR CHEST PA AND LATERAL Patient Name:  PASTOR SGRO Reason provided in the MEDICAL RECORD NUMBER Heart Size, Lung Aeration, Disease Progression, R06.00 Dyspnea, unspecified The report below contains medical terminology and recommendations which are best discussed with your ordering provider. COMPARISON: None FINDINGS/IMPRESSION: *  Extensive anterior and superior mediastinal lymphadenopathy, extending into the  aorticopulmonary window. Consider metastatic neoplasm or lymphoproliferative disease. Recommend CT if not already performed. *  Heart size upper normal *  Diffuse bronchial wall thickening, nonspecific *  No large pleural effusions or pneumothorax Electronically Signed by:  Lamar Campanile, MD, [facility] Electronically Signed on:  04/16/2024 12:13 PM  _____________________  Code Status: Full Code Goals of care were not addressed during this admission.   Status on Discharge:  Current activity: Walks occasionally (04/27/24 0829) Current mobility: No limitation (04/27/24 0829)  Activity Recommendation: activity as tolerated  Other Discharge Instructions: Services setup at discharge: None Tubes/lines at discharge: None  Diet: Diet regular  Wound Care Order Instructions             Wound Care  2 times daily       Question Answer Comment  Body Site R groin   Instructions Ser[place]ang drainage expected on R groin, can change dressing BID, replace with dry gauze and medipore tape. Steri strips will fall off on their own.                 _____________________  Time spent on discharge process: 35 minutes    Dock Burkes, AGNP-C  CAITLYN CY PURCELL BURKES, NP [facility]  04/27/2024   Hospital Contact Information:  [facility]) [facility]) [facility])  Pending tests:  Laboratory: [phone] Microbiology: [phone] Pathology: [phone] Radiology: [phone]  General questions: [phone] Pending tests: Laboratory: [phone] Microbiology: [phone] Pathology: [phone] Radiology: [phone]  General questions:  [phone] Pending tests:  Laboratory: [phone] Microbiology: [phone] Pathology: [phone] Radiology: [phone]  General questions:  [phone] Pending tests: Laboratory: [phone] Microbiology: [phone] Pathology: [phone] Radiology: [phone]  General questions:  [phone]     Attestation Statement:   I personally saw the patient and performed a substantive portion of the medical decision making, in conjunction with Glennon Burkes, APPfor the condition/treatment of high grade B cell lymphoma. Mr. 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 Now with newly diagnosed DLBCL, ABC subtype s/p groin LN biopsy on 1/8. Started on R-CHOP as inpatient (today is C1D6) with split dose on d1-2 and TLS monitoring.  Tolerated chemotherapy well with no major side effects during admission. Followed by sickle cell rounding service during his admission who have addressed simple transfusion thresholds as outlined above while on chemotherapy, and tentative plan for resumption of exchange transfusions after completing therapy and ensuring safe vascular access. Will follow with Dr. Blinda as outpatient for further therapy, tentatively planned for [place]-R-CHP.  Bernabe Rosier, MD, PhD Hematologic Malignancies and Cellular Therapy  "

## 2024-04-27 NOTE — Progress Notes (Signed)
 Case Manager Discharge Summary / Closing Note  Expected Discharge Date & Time: 04/27/2024 at   Discharge Plan:  Patient is discharging home with routine care and no post-acute services were indicated prior to discharge.    Post-Acute Services Coordinated: No resources indicated at this time  Transportation: Arrangements: patient arranged Discharge Transportation: taxi / bus / chief of staff  Final ADT:  Final ADT Discharge Disposition: Home Based  Home Based: Home or Self Care                 Second IMM Received: Not indicated (04/27/24 1345)  Final Summary: Patient discharged home.  Peter  Kline

## 2024-04-28 ENCOUNTER — Telehealth: Payer: Self-pay | Admitting: *Deleted

## 2024-04-28 NOTE — Transitions of Care (Post Inpatient/ED Visit) (Signed)
" ° °  04/28/2024  Name: Peter Kline MRN: [id number] DOB: 1965-07-15  Today's TOC FU Call Status: Today's TOC FU Call Status:: Unsuccessful Call (1st Attempt) Unsuccessful Call (1st Attempt) Date: 04/28/24  Attempted to reach the patient regarding the most recent Inpatient/ED visit.  Follow Up Plan: Additional outreach attempts will be made to reach the patient to complete the Transitions of Care (Post Inpatient/ED visit) call.   Cathlean Headland BSN RN [facility] Health Care Management Coordinator [email or web address] Direct Dial: [phone]  Fax: [phone] Website: [email or web address]  "

## 2024-04-28 NOTE — Transitions of Care (Post Inpatient/ED Visit) (Signed)
 "  04/28/2024  Name: Peter Kline MRN: [id number] DOB: [birth year]  Today's TOC FU Call Status: Today's TOC FU Call Status:: Successful TOC FU Call Completed TOC FU Call Complete Date: 04/28/24  Patient's Name and Date of Birth confirmed. Name, DOB  Transition Care Management Follow-up Telephone Call Date of Discharge: 04/27/24 Discharge Facility: Other [facility]) Name of Other (Non-[facility]) Discharge Facility: [facility] Type of Discharge: Inpatient Admission Primary Inpatient Discharge Diagnosis:: Lymphadenopathy How have you been since you were released from the hospital?: Better Any questions or concerns?: No  Items Reviewed: Did you receive and understand the discharge instructions provided?: Yes Medications obtained,verified, and reconciled?: Yes (Medications Reviewed) Any new allergies since your discharge?: No Dietary orders reviewed?: No Do you have support at home?: Yes People in Home [RPT]: significant other Name of Support/Comfort Primary Source: Peter Kline  Medications Reviewed Today: Medications Reviewed Today     Reviewed by Kennieth [name], RN (Case Manager) on 04/28/24 at 1611  Med List Status: <None>   Medication Order Taking? Sig Documenting Provider Last Dose Status Informant  acyclovir  (ZOVIRAX ) 200 MG capsule [id number]  Take 400 mg by mouth every 12 (twelve) hours. [provider]  Active   albuterol  (VENTOLIN  HFA) 108 (90 Base) MCG/ACT inhaler [id number] Yes TAKE 2 PUFFS BY MOUTH EVERY 6 HOURS AS NEEDED FOR WHEEZE OR SHORTNESS OF BREATH Paseda, Folashade R, FNP  Active   apixaban  (ELIQUIS ) 5 MG TABS tablet [id number] Yes Take 1 tablet (5 mg total) by mouth 2 (two) times daily. Paseda, Folashade R, FNP  Active   busPIRone  (BUSPAR ) 5 MG tablet [id number] Yes TAKE 1 TABLET BY MOUTH TWICE A DAY Paseda, Folashade R, FNP  Active   cefdinir (OMNICEF) 300 MG capsule [id number] Yes Take 300 mg by mouth every 12 (twelve) hours. Take  1 capsule (300 mg total) by mouth every 12 (twelve) hours for 30 days [provider]  Active   folic acid  (FOLVITE ) 1 MG tablet [id number] Yes Take 1 tablet (1 mg total) by mouth daily. Paseda, Folashade R, FNP  Active   HYDROcodone -acetaminophen  (NORCO/VICODIN) 5-325 MG tablet [id number]  Take 1 tablet by mouth every 6 (six) hours as needed for severe pain (pain score 7-10). Ruthell Lonni FALCON, PA-C  Active   hydrocortisone  cream 1 % [id number] Yes Apply 1 Application topically 2 (two) times daily. Raenelle Coria, MD  Active   naloxone  (NARCAN ) nasal spray 4 mg/0.1 mL [id number] Yes Place 1 spray (4 mg total) into one nostril once as needed for overdose Paseda, Folashade R, FNP  Active   omeprazole  (PRILOSEC) 20 MG capsule [id number] Yes Take 1 capsule (20 mg total) by mouth daily. Paseda, Folashade R, FNP  Active   ondansetron  (ZOFRAN -ODT) 4 MG disintegrating tablet [id number] Yes Take 4 mg by mouth every 8 (eight) hours as needed for nausea or vomiting. as needed for up to 7 days [provider]  Active   oxyCODONE  (OXY IR/ROXICODONE ) 5 MG immediate release tablet [id number]  Take 1 tablet (5 mg total) by mouth every 6 (six) hours as needed for severe pain (pain score 7-10).  Patient not taking: Reported on 04/28/2024   Paseda, Folashade R, FNP  Active   oxycodone  (OXY-IR) 5 MG capsule [id number] Yes Take 2 capsules (10 mg total) by mouth every 6 (six) hours as needed. Armenta Canning, MD  Active            Med Note [name], [name] [place] Apr 28, 2024  4:11 PM) (For moderate to severe pain) for up to 28 days  polyethylene glycol (MIRALAX  / GLYCOLAX ) 17 g packet [id number] Yes Take 17 g by mouth daily as needed. as needed for up to 30 days Mix in 4-8ounces of fluid prior to taking. [provider]  Active   senna (SENOKOT) 8.6 MG tablet [id number] Yes Take 2 tablets by mouth 2 (two) times daily as needed for constipation. as needed for Constipation for up to 30 days [provider]  Active   sulfamethoxazole-trimethoprim (BACTRIM DS) 800-160 MG tablet [id number] Yes Take 1 tablet by mouth 3 (three) times a week. every Monday, Wednesday, and Friday for 60 days [provider]  Active   VITAMIN D  PO [id number] Yes Take by mouth. [provider]  Active             Home Care and Equipment/Supplies: Were Home Health Services Ordered?: NA Any new equipment or medical supplies ordered?: NA  Functional Questionnaire: Do you need assistance with bathing/showering or dressing?: No Do you need assistance with meal preparation?: Yes Do you need assistance with eating?: No Do you have difficulty maintaining continence: No Do you need assistance with getting out of bed/getting out of a chair/moving?: No Do you have difficulty managing or taking your medications?: No  Follow up appointments reviewed: PCP Follow-up appointment confirmed?: Yes Date of PCP follow-up appointment?: 04/27/24 Follow-up Provider: Per patient he seen his PCP yesterday Specialist Hospital Follow-up appointment confirmed?: Yes Date of Specialist follow-up appointment?: 05/05/24 Follow-Up Specialty Provider:: 05/05/2024 8:40 AM STUDIES/LAB-[facility]   05/05/2024 9:30 AM LAB AND WAIT CLINIC [facility]   05/14/2024 7:55 AM STUDIES/LAB-[facility]   05/14/2024 9:00 AM Rosario, Summer, NP [facility]   05/14/2024 9:45 AM HEM MALIG PROVIDER [facility] Do you need transportation to your follow-up appointment?: No Do you understand care options if your condition(s) worsen?: Yes-patient verbalized understanding  SDOH Interventions Today    Flowsheet Row Most Recent Value  SDOH Interventions   Food Insecurity Interventions Intervention Not Indicated  Housing Interventions Intervention Not Indicated  Transportation Interventions Intervention Not Indicated, Community Resources Provided  [RN made patient aware of [facility] that will carry medicad patient to [facility]]  Utilities Interventions Intervention Not Indicated    Goals Addressed             This Visit's Progress    VBCI Transitions of Care (TOC) Care Plan       Problems:  Recent Hospitalization for treatment of lymphadenopathy Knowledge Deficit Related to lymphadenopathy.  Goal:  Over the next 30 days, the patient will not experience hospital readmission  Interventions:  Transitions of Care: Doctor Visits  - discussed the importance of doctor visits Referral to Longitudinal Nurse Case Manager for Ongoing follow-up  Patient Self Care Activities:  Attend all scheduled provider appointments Call pharmacy for medication refills 3-7 days in advance of running out of medications Call provider office for new concerns or questions  Notify RN Care Manager of TOC call rescheduling needs Participate in Transition of Care Program/Attend TOC scheduled calls Perform all self care activities independently  Take medications as prescribed   Use pain medication for relief as per ordered Monitoring for swelling, discharge or increase pain Avoid frequent touching ,pressing the nodes Can change dressing BID, replace with dry gauze and medipore tape  Plan:  An initial telephone outreach has been scheduled for: [id number] Next  PCP appointment scheduled for: Per patient he went [phone] Telephone follow up appointment with care management team member scheduled for:  [id number] Davina Green 3PM       Discussed and offered 30 day TOC program.  Patient   consented.  The patient has been provided with contact information for the care management team and has been advised to call with any health -related questions or concerns.  The patient verbalized understanding with current plan of care.  The patient is directed to their insurance card regarding availability of benefits coverage  RN went over all appts RN discussed Juda Shields as transportation to [employer] if  needed  [employer] [facility] Population Health Care Management Coordinator [email or web address] Direct Dial: [phone]  Fax: [phone] Website: [email or web address]  "

## 2024-05-06 ENCOUNTER — Telehealth: Payer: Self-pay | Admitting: *Deleted

## 2024-05-06 NOTE — Transitions of Care (Post Inpatient/ED Visit) (Signed)
" ° °  05/06/2024  Name: Peter Kline MRN: [id number] DOB: [birth year]  Today's TOC FU Call Status: Today's TOC FU Call Status:: Unsuccessful Call (1st Attempt) Unsuccessful Call (1st Attempt) Date: 05/06/24  Attempted to reach the patient regarding the most recent Inpatient/ED visit.  Follow Up Plan: Additional outreach attempts will be made to reach the patient to complete the Transitions of Care (Post Inpatient/ED visit) call.    Olam Ku, RN, BSN [place]  [facility], [facility] RN Care Manager Direct Dial: [phone]  Fax: [phone]   "

## 2024-05-07 ENCOUNTER — Encounter: Payer: Self-pay | Admitting: *Deleted

## 2024-05-07 NOTE — Transitions of Care (Post Inpatient/ED Visit) (Unsigned)
" ° °  05/07/2024  Name: Peter Kline MRN: [id number] DOB: November 05, 1965  Today's TOC FU Call Status: Today's TOC FU Call Status:: Unsuccessful Call (2nd Attempt) Unsuccessful Call (2nd Attempt) Date: 05/07/24  Attempted to reach the patient regarding the most recent Inpatient/ED visit.  Follow Up Plan: Additional outreach attempts will be made to reach the patient to complete the Transitions of Care (Post Inpatient/ED visit) call.    Olam Ku, RN, BSN [place]  [facility], [facility] RN Care Manager Direct Dial: [phone]  Fax: [phone]   "

## 2024-05-10 ENCOUNTER — Telehealth: Payer: Self-pay | Admitting: *Deleted

## 2024-05-10 NOTE — Transitions of Care (Post Inpatient/ED Visit) (Signed)
" ° °  05/10/2024  Name: Peter Kline MRN: [id number] DOB: 1965/06/09  Today's TOC FU Call Status: Today's TOC FU Call Status:: Unsuccessful Call (3rd Attempt) Unsuccessful Call (3rd Attempt) Date: 05/10/24  Attempted to reach the patient regarding the most recent Inpatient/ED visit.  Follow Up Plan: No further outreach attempts will be made at this time. We have been unable to contact the patient.   Olam Ku, RN, BSN [place]  [facility], [facility] RN Care Manager Direct Dial: [phone]  Fax: [phone]   "
